# Patient Record
Sex: Male | Born: 1950 | Race: White | Hispanic: No | Marital: Single | Smoking: Never smoker
Health system: Southern US, Community
[De-identification: ages and names within clinical notes are randomized; demographics above are authoritative.]

## PROBLEM LIST (undated history)

## (undated) DIAGNOSIS — C801 Malignant (primary) neoplasm, unspecified: Secondary | ICD-10-CM

## (undated) HISTORY — PX: CATARACT EXTRACTION: SUR2

## (undated) HISTORY — PX: RETINAL DETACHMENT SURGERY: SHX105

---

## 1972-12-13 HISTORY — PX: OTHER SURGICAL HISTORY: SHX169

## 2010-12-13 HISTORY — PX: OTHER SURGICAL HISTORY: SHX169

## 2011-12-13 ENCOUNTER — Other Ambulatory Visit: Payer: Self-pay | Admitting: Internal Medicine

## 2011-12-13 DIAGNOSIS — M545 Low back pain, unspecified: Secondary | ICD-10-CM

## 2011-12-15 ENCOUNTER — Other Ambulatory Visit: Payer: Self-pay | Admitting: Internal Medicine

## 2011-12-15 DIAGNOSIS — M545 Low back pain, unspecified: Secondary | ICD-10-CM

## 2011-12-16 ENCOUNTER — Ambulatory Visit
Admission: RE | Admit: 2011-12-16 | Discharge: 2011-12-16 | Disposition: A | Discharge status: Home / Self Care | Payer: BC Managed Care – PPO | Source: Ambulatory Visit | Attending: Internal Medicine | Admitting: Internal Medicine

## 2011-12-16 DIAGNOSIS — M545 Low back pain, unspecified: Secondary | ICD-10-CM

## 2012-02-25 ENCOUNTER — Other Ambulatory Visit: Payer: Self-pay | Admitting: Neurosurgery

## 2012-02-25 DIAGNOSIS — M545 Low back pain, unspecified: Secondary | ICD-10-CM

## 2012-03-23 ENCOUNTER — Ambulatory Visit
Admission: RE | Admit: 2012-03-23 | Discharge: 2012-03-23 | Disposition: A | Discharge status: Home / Self Care | Payer: BC Managed Care – PPO | Source: Ambulatory Visit | Attending: Neurosurgery | Admitting: Neurosurgery

## 2012-03-23 DIAGNOSIS — M545 Low back pain, unspecified: Secondary | ICD-10-CM

## 2012-07-06 ENCOUNTER — Other Ambulatory Visit (HOSPITAL_COMMUNITY): Payer: Self-pay | Admitting: Neurosurgery

## 2012-07-07 ENCOUNTER — Other Ambulatory Visit (HOSPITAL_COMMUNITY): Payer: Self-pay | Admitting: Neurosurgery

## 2012-07-07 DIAGNOSIS — S32009A Unspecified fracture of unspecified lumbar vertebra, initial encounter for closed fracture: Secondary | ICD-10-CM

## 2012-07-12 ENCOUNTER — Encounter (HOSPITAL_COMMUNITY)
Admission: RE | Admit: 2012-07-12 | Discharge: 2012-07-12 | Disposition: A | Discharge status: Home / Self Care | Payer: BC Managed Care – PPO | Source: Ambulatory Visit | Attending: Neurosurgery | Admitting: Neurosurgery

## 2012-07-12 DIAGNOSIS — M4120 Other idiopathic scoliosis, site unspecified: Secondary | ICD-10-CM | POA: Insufficient documentation

## 2012-07-12 DIAGNOSIS — X58XXXA Exposure to other specified factors, initial encounter: Secondary | ICD-10-CM | POA: Insufficient documentation

## 2012-07-12 DIAGNOSIS — S32009A Unspecified fracture of unspecified lumbar vertebra, initial encounter for closed fracture: Secondary | ICD-10-CM

## 2012-07-12 MED ORDER — TECHNETIUM TC 99M MEDRONATE IV KIT
25.0000 | PACK | Freq: Once | INTRAVENOUS | Status: AC | PRN
  Administered 2012-07-12: 25 via INTRAVENOUS

## 2013-12-24 ENCOUNTER — Encounter (INDEPENDENT_AMBULATORY_CARE_PROVIDER_SITE_OTHER): Payer: Self-pay

## 2013-12-24 ENCOUNTER — Ambulatory Visit (INDEPENDENT_AMBULATORY_CARE_PROVIDER_SITE_OTHER): Payer: BC Managed Care – PPO | Admitting: Neurology

## 2013-12-24 ENCOUNTER — Encounter: Payer: Self-pay | Admitting: Neurology

## 2013-12-24 VITALS — BP 157/84 | HR 65 | Ht 66.0 in | Wt 180.0 lb

## 2013-12-24 DIAGNOSIS — R202 Paresthesia of skin: Secondary | ICD-10-CM | POA: Insufficient documentation

## 2013-12-24 DIAGNOSIS — R209 Unspecified disturbances of skin sensation: Secondary | ICD-10-CM

## 2013-12-24 DIAGNOSIS — R29898 Other symptoms and signs involving the musculoskeletal system: Secondary | ICD-10-CM
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
## 2013-12-25 LAB — HEPATITIS PANEL, ACUTE
Hep A IgM: NEGATIVE
Hep B C IgM: NEGATIVE
Hep C Virus Ab: <0.1 s/co ratio (ref 0.0–0.9)
Hepatitis B Surface Ag: NEGATIVE

## 2013-12-25 LAB — COMPREHENSIVE METABOLIC PANEL
ALT: 29 IU/L (ref 0–44)
AST: 22 IU/L (ref 0–40)
Albumin/Globulin Ratio: 1.8 (ref 1.1–2.5)
Albumin: 4.8 g/dL (ref 3.6–4.8)
Alkaline Phosphatase: 49 IU/L (ref 39–117)
BUN/Creatinine Ratio: 25 — ABNORMAL HIGH (ref 10–22)
BUN: 22 mg/dL (ref 8–27)
CO2: 24 mmol/L (ref 18–29)
Calcium: 9.6 mg/dL (ref 8.6–10.2)
Chloride: 100 mmol/L (ref 97–108)
Creatinine, Ser: 0.87 mg/dL (ref 0.76–1.27)
GFR calc Af Amer: 105 mL/min/{1.73_m2} (ref >59)
GFR calc non Af Amer: 91 mL/min/{1.73_m2} (ref >59)
Globulin, Total: 2.7 g/dL (ref 1.5–4.5)
Glucose: 96 mg/dL (ref 65–99)
Potassium: 4.5 mmol/L (ref 3.5–5.2)
Sodium: 144 mmol/L (ref 134–144)
Total Bilirubin: 0.9 mg/dL (ref 0.0–1.2)
Total Protein: 7.5 g/dL (ref 6.0–8.5)

## 2013-12-25 LAB — CBC WITH DIFFERENTIAL
Basophils Absolute: 0 10*3/uL (ref 0.0–0.2)
Basos: 0 %
Eos: 0 %
Eosinophils Absolute: 0 10*3/uL (ref 0.0–0.4)
HCT: 42.9 % (ref 37.5–51.0)
Hemoglobin: 14.8 g/dL (ref 12.6–17.7)
Immature Grans (Abs): 0 10*3/uL (ref 0.0–0.1)
Immature Granulocytes: 0 %
Lymphocytes Absolute: 0.8 10*3/uL (ref 0.7–3.1)
Lymphs: 6 %
MCH: 28.4 pg (ref 26.6–33.0)
MCHC: 34.5 g/dL (ref 31.5–35.7)
MCV: 82 fL (ref 79–97)
Monocytes Absolute: 0.5 10*3/uL (ref 0.1–0.9)
Monocytes: 4 %
Neutrophils Absolute: 11.7 10*3/uL — ABNORMAL HIGH (ref 1.4–7.0)
Neutrophils Relative %: 90 %
Platelets: 186 10*3/uL (ref 150–379)
RBC: 5.21 x10E6/uL (ref 4.14–5.80)
RDW: 15.1 % (ref 12.3–15.4)
WBC: 13 10*3/uL — ABNORMAL HIGH (ref 3.4–10.8)

## 2013-12-25 LAB — THYROID PANEL WITH TSH
Free Thyroxine Index: 1.9 (ref 1.2–4.9)
T3 Uptake Ratio: 25 % (ref 24–39)
T4, Total: 7.4 ug/dL (ref 4.5–12.0)
TSH: 0.625 u[IU]/mL (ref 0.450–4.500)

## 2013-12-25 LAB — SEDIMENTATION RATE: Sed Rate: 4 mm/hr (ref 0–30)

## 2013-12-25 LAB — FOLATE: Folate: 18.4 ng/mL (ref >3.0)

## 2013-12-25 LAB — VITAMIN B12: Vitamin B-12: 418 pg/mL (ref 211–946)

## 2013-12-25 LAB — RPR: RPR: NONREACTIVE

## 2013-12-25 LAB — C-REACTIVE PROTEIN: CRP: 0.5 mg/L (ref 0.0–4.9)

## 2013-12-25 LAB — CK: Total CK: 190 U/L (ref 24–204)

## 2013-12-25 LAB — HIV ANTIBODY (ROUTINE TESTING W REFLEX)
HIV 1/O/2 Abs-Index Value: <1 (ref <1.00)
HIV-1/HIV-2 Ab: NONREACTIVE

## 2013-12-25 LAB — LYME, TOTAL AB TEST/REFLEX: Lyme IgG/IgM Ab: <0.91 {ISR} (ref 0.00–0.90)

## 2013-12-26 ENCOUNTER — Encounter (INDEPENDENT_AMBULATORY_CARE_PROVIDER_SITE_OTHER): Payer: Self-pay | Admitting: Radiology

## 2013-12-26 ENCOUNTER — Ambulatory Visit (INDEPENDENT_AMBULATORY_CARE_PROVIDER_SITE_OTHER): Payer: BC Managed Care – PPO | Admitting: Neurology

## 2013-12-26 DIAGNOSIS — R29898 Other symptoms and signs involving the musculoskeletal system: Secondary | ICD-10-CM

## 2013-12-26 DIAGNOSIS — Z0289 Encounter for other administrative examinations: Secondary | ICD-10-CM

## 2013-12-26 DIAGNOSIS — R202 Paresthesia of skin: Secondary | ICD-10-CM

## 2013-12-26 DIAGNOSIS — R209 Unspecified disturbances of skin sensation: Secondary | ICD-10-CM

## 2013-12-28 NOTE — Procedures (Signed)
    [facility]  NCS (NERVE CONDUCTION STUDY) WITH EMG (ELECTROMYOGRAPHY) REPORT   STUDY DATE: Jan 14th 2015. PATIENT NAME: Nathaniel Norton DOB: 06-28-1951 MRN: [id number]    TECHNOLOGIST: Towana Badger ELECTROMYOGRAPHER: Marcial Pacas M.D.  CLINICAL INFORMATION:  63 years old right-handed Caucasian male presenting with acute onset of right proximal arm weakness since 12/10/2013, he denies significant neck or right shoulder pain, he also complains of  paresthesia at left C5-6 dermatomes  On examination: Right shoulder abduction 4,  external rotation 4, right elbow flexion 4, extension 5-, supination 4 , pronation 4+, wrist flexion 5, wrist extension 5. He has mild decreased light touch at left C5-6 dermatomes. Deep tendon reflexes bilateral biceps 2,  Triceps 2 brachioradialis 2.   Nerve conduction study:  Bilateral ulnar, radial sensory responses were normal, with similar snap amplitude. Right median sensory response showed mildly prolonged peak latency, with normal snap amplitude. Right median motor response showed mildly prolonged distal latency, with normal symmetric amplitude, conduction velocity.  Left median sensory and motor responses were normal.  Bilateral  lateral antebrachial cutaneous sensory responses were present, right side showed more than 50% decreased snap amplitude compared to her left side.  Electromyography: Selected needle examination was performed at bilateral upper extremity muscles, right cervical paraspinal muscles.  There was significant chronic neuropathic changes involving right biceps, brachial radialis, brachialis, supraspinatus, infraspinatus, serratous anterior, rhomboid, which showed normally insertion activity no spontaneous activity enlarged motor unit potential with decreased recruitment patterns,    Slighter degree of chronic neuropathic changes involving right triceps, pronator teres: normally insertion activity, no spontaneous activity,  mixture of normal, some enlarged motor unit potential with slightly decreased recruitment patterns.   Needle examination of extensor digital communis, first dorsal interossei was normal. There was no spontaneous activity at right cervical paraspinal muscles right C5, 6, 7.  Needle examination of left deltoid, biceps, triceps, extensor digitorum communis was normal.  In conclusion:  This is an abnormal study, there is electrodiagnostic evidence of chronic neuropathic changes involving right C5, 6, lesser degree C7 myotomes, with well-preserved sensory response, abnormal findings support a diagnosis of the acute right C5, 6, 7 nerve roots vs anterior horn cell pathology.  There is also evidence of moderate right carpal tunnel syndrome . MRI of the cervical spine is planned,

## 2014-04-15 DIAGNOSIS — M810 Age-related osteoporosis without current pathological fracture: Secondary | ICD-10-CM | POA: Diagnosis not present

## 2014-10-28 DIAGNOSIS — Z23 Encounter for immunization: Secondary | ICD-10-CM | POA: Diagnosis not present

## 2014-10-28 DIAGNOSIS — Z1389 Encounter for screening for other disorder: Secondary | ICD-10-CM | POA: Diagnosis not present

## 2014-10-28 DIAGNOSIS — R202 Paresthesia of skin: Secondary | ICD-10-CM | POA: Diagnosis not present

## 2014-10-28 DIAGNOSIS — Z0000 Encounter for general adult medical examination without abnormal findings: Secondary | ICD-10-CM | POA: Diagnosis not present

## 2014-10-28 DIAGNOSIS — L821 Other seborrheic keratosis: Secondary | ICD-10-CM | POA: Diagnosis not present

## 2014-10-28 DIAGNOSIS — Z79899 Other long term (current) drug therapy: Secondary | ICD-10-CM | POA: Diagnosis not present

## 2014-10-31 DIAGNOSIS — E875 Hyperkalemia: Secondary | ICD-10-CM | POA: Diagnosis not present

## 2014-10-31 DIAGNOSIS — I10 Essential (primary) hypertension: Secondary | ICD-10-CM | POA: Diagnosis not present

## 2014-10-31 DIAGNOSIS — Z79899 Other long term (current) drug therapy: Secondary | ICD-10-CM | POA: Diagnosis not present

## 2014-11-28 DIAGNOSIS — H33022 Retinal detachment with multiple breaks, left eye: Secondary | ICD-10-CM | POA: Diagnosis not present

## 2014-11-28 DIAGNOSIS — H26491 Other secondary cataract, right eye: Secondary | ICD-10-CM | POA: Diagnosis not present

## 2014-11-28 DIAGNOSIS — H21233 Degeneration of iris (pigmentary), bilateral: Secondary | ICD-10-CM | POA: Diagnosis not present

## 2014-11-28 DIAGNOSIS — Z9842 Cataract extraction status, left eye: Secondary | ICD-10-CM | POA: Diagnosis not present

## 2014-11-28 DIAGNOSIS — H33021 Retinal detachment with multiple breaks, right eye: Secondary | ICD-10-CM | POA: Diagnosis not present

## 2014-11-28 DIAGNOSIS — Z9841 Cataract extraction status, right eye: Secondary | ICD-10-CM | POA: Diagnosis not present

## 2015-04-15 DIAGNOSIS — Z9841 Cataract extraction status, right eye: Secondary | ICD-10-CM | POA: Diagnosis not present

## 2015-04-15 DIAGNOSIS — H26491 Other secondary cataract, right eye: Secondary | ICD-10-CM | POA: Diagnosis not present

## 2015-04-15 DIAGNOSIS — H21233 Degeneration of iris (pigmentary), bilateral: Secondary | ICD-10-CM | POA: Diagnosis not present

## 2015-04-15 DIAGNOSIS — H33021 Retinal detachment with multiple breaks, right eye: Secondary | ICD-10-CM | POA: Diagnosis not present

## 2015-04-15 DIAGNOSIS — H33022 Retinal detachment with multiple breaks, left eye: Secondary | ICD-10-CM | POA: Diagnosis not present

## 2015-11-03 DIAGNOSIS — Z0000 Encounter for general adult medical examination without abnormal findings: Secondary | ICD-10-CM | POA: Diagnosis not present

## 2015-11-03 DIAGNOSIS — Z79899 Other long term (current) drug therapy: Secondary | ICD-10-CM | POA: Diagnosis not present

## 2015-11-03 DIAGNOSIS — Z23 Encounter for immunization: Secondary | ICD-10-CM | POA: Diagnosis not present

## 2015-11-03 DIAGNOSIS — Z1389 Encounter for screening for other disorder: Secondary | ICD-10-CM | POA: Diagnosis not present

## 2015-11-03 DIAGNOSIS — M810 Age-related osteoporosis without current pathological fracture: Secondary | ICD-10-CM | POA: Diagnosis not present

## 2016-05-04 DIAGNOSIS — H21233 Degeneration of iris (pigmentary), bilateral: Secondary | ICD-10-CM | POA: Diagnosis not present

## 2016-05-04 DIAGNOSIS — H5213 Myopia, bilateral: Secondary | ICD-10-CM | POA: Diagnosis not present

## 2016-05-04 DIAGNOSIS — Z9842 Cataract extraction status, left eye: Secondary | ICD-10-CM | POA: Diagnosis not present

## 2016-05-04 DIAGNOSIS — H2640 Unspecified secondary cataract: Secondary | ICD-10-CM | POA: Diagnosis not present

## 2016-05-04 DIAGNOSIS — Z8669 Personal history of other diseases of the nervous system and sense organs: Secondary | ICD-10-CM | POA: Diagnosis not present

## 2016-05-04 DIAGNOSIS — Z9841 Cataract extraction status, right eye: Secondary | ICD-10-CM | POA: Diagnosis not present

## 2016-05-04 DIAGNOSIS — Z9889 Other specified postprocedural states: Secondary | ICD-10-CM | POA: Diagnosis not present

## 2016-05-04 DIAGNOSIS — Z961 Presence of intraocular lens: Secondary | ICD-10-CM | POA: Diagnosis not present

## 2016-05-04 DIAGNOSIS — H59813 Chorioretinal scars after surgery for detachment, bilateral: Secondary | ICD-10-CM | POA: Diagnosis not present

## 2016-05-04 DIAGNOSIS — H52203 Unspecified astigmatism, bilateral: Secondary | ICD-10-CM | POA: Diagnosis not present

## 2016-05-04 DIAGNOSIS — H524 Presbyopia: Secondary | ICD-10-CM | POA: Diagnosis not present

## 2016-11-03 DIAGNOSIS — Z79899 Other long term (current) drug therapy: Secondary | ICD-10-CM | POA: Diagnosis not present

## 2016-11-03 DIAGNOSIS — Z1389 Encounter for screening for other disorder: Secondary | ICD-10-CM | POA: Diagnosis not present

## 2016-11-03 DIAGNOSIS — M810 Age-related osteoporosis without current pathological fracture: Secondary | ICD-10-CM | POA: Diagnosis not present

## 2016-11-03 DIAGNOSIS — J301 Allergic rhinitis due to pollen: Secondary | ICD-10-CM | POA: Diagnosis not present

## 2016-11-03 DIAGNOSIS — Z23 Encounter for immunization: Secondary | ICD-10-CM | POA: Diagnosis not present

## 2016-11-03 DIAGNOSIS — Z0000 Encounter for general adult medical examination without abnormal findings: Secondary | ICD-10-CM | POA: Diagnosis not present

## 2016-11-09 DIAGNOSIS — M810 Age-related osteoporosis without current pathological fracture: Secondary | ICD-10-CM | POA: Diagnosis not present

## 2016-11-09 DIAGNOSIS — M8588 Other specified disorders of bone density and structure, other site: Secondary | ICD-10-CM | POA: Diagnosis not present

## 2017-05-05 DIAGNOSIS — H21233 Degeneration of iris (pigmentary), bilateral: Secondary | ICD-10-CM | POA: Diagnosis not present

## 2017-05-05 DIAGNOSIS — H33022 Retinal detachment with multiple breaks, left eye: Secondary | ICD-10-CM | POA: Diagnosis not present

## 2017-05-05 DIAGNOSIS — H33021 Retinal detachment with multiple breaks, right eye: Secondary | ICD-10-CM | POA: Diagnosis not present

## 2017-05-05 DIAGNOSIS — Z961 Presence of intraocular lens: Secondary | ICD-10-CM | POA: Diagnosis not present

## 2017-11-08 DIAGNOSIS — Z6826 Body mass index (BMI) 26.0-26.9, adult: Secondary | ICD-10-CM | POA: Diagnosis not present

## 2017-11-08 DIAGNOSIS — Z0000 Encounter for general adult medical examination without abnormal findings: Secondary | ICD-10-CM | POA: Diagnosis not present

## 2017-11-08 DIAGNOSIS — S61412A Laceration without foreign body of left hand, initial encounter: Secondary | ICD-10-CM | POA: Diagnosis not present

## 2017-11-08 DIAGNOSIS — Z1389 Encounter for screening for other disorder: Secondary | ICD-10-CM | POA: Diagnosis not present

## 2017-11-08 DIAGNOSIS — Z23 Encounter for immunization: Secondary | ICD-10-CM | POA: Diagnosis not present

## 2017-11-08 DIAGNOSIS — E663 Overweight: Secondary | ICD-10-CM | POA: Diagnosis not present

## 2017-11-08 DIAGNOSIS — M810 Age-related osteoporosis without current pathological fracture: Secondary | ICD-10-CM | POA: Diagnosis not present

## 2017-11-08 DIAGNOSIS — S32040D Wedge compression fracture of fourth lumbar vertebra, subsequent encounter for fracture with routine healing: Secondary | ICD-10-CM | POA: Diagnosis not present

## 2018-11-14 DIAGNOSIS — Z125 Encounter for screening for malignant neoplasm of prostate: Secondary | ICD-10-CM | POA: Diagnosis not present

## 2018-11-14 DIAGNOSIS — Z1389 Encounter for screening for other disorder: Secondary | ICD-10-CM | POA: Diagnosis not present

## 2018-11-14 DIAGNOSIS — Z136 Encounter for screening for cardiovascular disorders: Secondary | ICD-10-CM | POA: Diagnosis not present

## 2018-11-14 DIAGNOSIS — Z131 Encounter for screening for diabetes mellitus: Secondary | ICD-10-CM | POA: Diagnosis not present

## 2018-11-14 DIAGNOSIS — Z0000 Encounter for general adult medical examination without abnormal findings: Secondary | ICD-10-CM | POA: Diagnosis not present

## 2018-11-14 DIAGNOSIS — Z23 Encounter for immunization: Secondary | ICD-10-CM | POA: Diagnosis not present

## 2018-11-14 DIAGNOSIS — S32040D Wedge compression fracture of fourth lumbar vertebra, subsequent encounter for fracture with routine healing: Secondary | ICD-10-CM | POA: Diagnosis not present

## 2018-11-14 DIAGNOSIS — M810 Age-related osteoporosis without current pathological fracture: Secondary | ICD-10-CM | POA: Diagnosis not present

## 2018-11-15 DIAGNOSIS — M8589 Other specified disorders of bone density and structure, multiple sites: Secondary | ICD-10-CM | POA: Diagnosis not present

## 2018-11-16 DIAGNOSIS — H21233 Degeneration of iris (pigmentary), bilateral: Secondary | ICD-10-CM | POA: Diagnosis not present

## 2018-11-16 DIAGNOSIS — Z961 Presence of intraocular lens: Secondary | ICD-10-CM | POA: Diagnosis not present

## 2018-11-16 DIAGNOSIS — Z8669 Personal history of other diseases of the nervous system and sense organs: Secondary | ICD-10-CM | POA: Diagnosis not present

## 2018-11-28 DIAGNOSIS — M810 Age-related osteoporosis without current pathological fracture: Secondary | ICD-10-CM | POA: Diagnosis not present

## 2019-01-24 DIAGNOSIS — R972 Elevated prostate specific antigen [PSA]: Secondary | ICD-10-CM | POA: Diagnosis not present

## 2019-03-30 DIAGNOSIS — Z1211 Encounter for screening for malignant neoplasm of colon: Secondary | ICD-10-CM | POA: Diagnosis not present

## 2019-04-02 DIAGNOSIS — K219 Gastro-esophageal reflux disease without esophagitis: Secondary | ICD-10-CM | POA: Diagnosis not present

## 2019-04-02 DIAGNOSIS — R0600 Dyspnea, unspecified: Secondary | ICD-10-CM | POA: Diagnosis not present

## 2019-04-02 DIAGNOSIS — R5383 Other fatigue: Secondary | ICD-10-CM | POA: Diagnosis not present

## 2019-04-02 DIAGNOSIS — R195 Other fecal abnormalities: Secondary | ICD-10-CM | POA: Diagnosis not present

## 2019-04-03 DIAGNOSIS — D539 Nutritional anemia, unspecified: Secondary | ICD-10-CM | POA: Diagnosis not present

## 2019-04-03 DIAGNOSIS — R5383 Other fatigue: Secondary | ICD-10-CM | POA: Diagnosis not present

## 2019-04-05 DIAGNOSIS — K219 Gastro-esophageal reflux disease without esophagitis: Secondary | ICD-10-CM | POA: Diagnosis not present

## 2019-04-05 DIAGNOSIS — K921 Melena: Secondary | ICD-10-CM | POA: Diagnosis not present

## 2019-04-05 DIAGNOSIS — D500 Iron deficiency anemia secondary to blood loss (chronic): Secondary | ICD-10-CM | POA: Diagnosis not present

## 2019-04-09 DIAGNOSIS — Q438 Other specified congenital malformations of intestine: Secondary | ICD-10-CM | POA: Diagnosis not present

## 2019-04-09 DIAGNOSIS — D123 Benign neoplasm of transverse colon: Secondary | ICD-10-CM | POA: Diagnosis not present

## 2019-04-09 DIAGNOSIS — C189 Malignant neoplasm of colon, unspecified: Secondary | ICD-10-CM | POA: Diagnosis not present

## 2019-04-09 DIAGNOSIS — D500 Iron deficiency anemia secondary to blood loss (chronic): Secondary | ICD-10-CM | POA: Diagnosis not present

## 2019-04-09 DIAGNOSIS — K921 Melena: Secondary | ICD-10-CM | POA: Diagnosis not present

## 2019-04-09 DIAGNOSIS — D122 Benign neoplasm of ascending colon: Secondary | ICD-10-CM | POA: Diagnosis not present

## 2019-04-09 DIAGNOSIS — K5669 Other partial intestinal obstruction: Secondary | ICD-10-CM | POA: Diagnosis not present

## 2019-04-09 DIAGNOSIS — C183 Malignant neoplasm of hepatic flexure: Secondary | ICD-10-CM | POA: Diagnosis not present

## 2019-04-09 DIAGNOSIS — K5730 Diverticulosis of large intestine without perforation or abscess without bleeding: Secondary | ICD-10-CM | POA: Diagnosis not present

## 2019-04-09 DIAGNOSIS — D490 Neoplasm of unspecified behavior of digestive system: Secondary | ICD-10-CM | POA: Diagnosis not present

## 2019-04-09 DIAGNOSIS — K6389 Other specified diseases of intestine: Secondary | ICD-10-CM | POA: Diagnosis not present

## 2019-04-09 DIAGNOSIS — K621 Rectal polyp: Secondary | ICD-10-CM | POA: Diagnosis not present

## 2019-04-10 ENCOUNTER — Other Ambulatory Visit: Payer: Self-pay | Admitting: Gastroenterology

## 2019-04-10 ENCOUNTER — Ambulatory Visit
Admission: RE | Admit: 2019-04-10 | Discharge: 2019-04-10 | Disposition: A | Discharge status: Home / Self Care | Payer: Medicare Other | Source: Ambulatory Visit | Attending: Gastroenterology | Admitting: Gastroenterology

## 2019-04-10 ENCOUNTER — Other Ambulatory Visit: Payer: Self-pay

## 2019-04-10 DIAGNOSIS — K6389 Other specified diseases of intestine: Secondary | ICD-10-CM | POA: Diagnosis not present

## 2019-04-10 MED ORDER — IOPAMIDOL (ISOVUE-300) INJECTION 61%
100.0000 mL | Freq: Once | INTRAVENOUS | Status: AC | PRN
  Administered 2019-04-10: 100 mL via INTRAVENOUS

## 2019-04-12 DIAGNOSIS — C189 Malignant neoplasm of colon, unspecified: Secondary | ICD-10-CM | POA: Diagnosis not present

## 2019-04-12 DIAGNOSIS — D122 Benign neoplasm of ascending colon: Secondary | ICD-10-CM | POA: Diagnosis not present

## 2019-04-12 DIAGNOSIS — D123 Benign neoplasm of transverse colon: Secondary | ICD-10-CM | POA: Diagnosis not present

## 2019-04-17 ENCOUNTER — Telehealth: Payer: Self-pay | Admitting: Hematology

## 2019-04-17 NOTE — Telephone Encounter (Signed)
A new patient appt has been scheduled for Nathaniel Norton to see Dr. Burr Medico on 5/8 at 230pm. Pt aware to arrive 15 minutes early to be checked in.

## 2019-04-19 NOTE — Progress Notes (Signed)
[facility]   [phone] [phone]   Clinic New Consult Note   Patient Care Team: Lajean Manes, MD as PCP - General (Internal Medicine) Berle Mull, MD as Consulting Physician (Family Medicine)  Date of Service:  04/20/2019   CHIEF COMPLAINTS/PURPOSE OF CONSULTATION:  Newly Diagnosed Colon Cancer  REFERRING PHYSICIAN:  Dr Watt Climes     Cancer of right colon [facility])   04/09/2019 Procedure    Colonoscopy 04/09/19 by Dr Watt Climes IMPRESSION -internal hemorrhoids -Diverticulosis in the sigmoid colon  -2 small polyps in the rectum and in the proximal transverse colon, removed with a hot snare. Resected and retrieved.  -3 medium polyps in the proximal transverse colon, in the mid transverse colon and in the distal transverse colon, removed and resected and retrieved.  -likely malignant partially obstructing tumor at the hepatic flexure. biopsied, tattooed.  -1 large polyp in the mid ascending colon  -the examination was otherwise normal      04/09/2019 Initial Biopsy    FINAL MICROSCOPIC DIAGNOSIS: 04/09/19 1. LG intestine-hepatic flexure, Biopsy:   INVASIVE WELL DIFFERENTIATED ADENOCARCINOMA    04/10/2019 Imaging    CT AP 04/10/19  IMPRESSION: 1. There is an eccentric mass of the colon involving the ascending colon near the hepatic flexure measuring approximately 3.4 x 3.4 by 2.0 cm (series 2, image 37, series 3, image 37). There is extensive soft tissue nodularity of the mesocolon and omentum, and likely areas of the peritoneum, for example bilateral upper quadrants (series 2, image 25). Findings are consistent with primary colon malignancy, probable omental and peritoneal involvement, and small volume malignant ascites. 2.  Other chronic and incidental findings as detailed above.    04/20/2019 Initial Diagnosis    Cancer of right colon (HCC)      HISTORY OF PRESENTING ILLNESS:  Nathaniel Norton 68 y.o. male is a here because of newly  diagnosed colon cancer. The patient was referred by Dr Watt Climes. The patient presents to the clinic today by himself.   He notes mild abdominal indigestion and moderate SOB for 2 months before he contacted his PCP. He dropped off his Stool card and results were positive and he had iron deficiency. He denies noticing black or bloody stool inially. He was Dr. Watt Climes afterward for more workup where he had coloscopy which showed he had colon cancer. Before his known diagnosis he was trying to maintain or lose weight during quarantine. So he subsequently did lose weight. He denies loss of appetite.   Today he notes since colonoscopy he has noticed having black stool. He has been taking iron pill lately. He notes his SOB has improved, he is able to be active still but tired afterward. He denies constipation, and has 1-2 bowel movements a day. He notes mid to right chest discomfort. He attributes to his increase in yardwork as muscular related. He notes he has elevated PSA was 5.7 in 11/2018 and 4.6 in 01/2019. He plans to wait on seeing Urologist for work up given colon cancer diagnosis which is understandable. 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   Socially he is single with no children. He lives alone in [place] in a 1 level home. He notes he does not have Internet access which is not a resource of communication for him. He does have a phone which he can be reached at. 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 He does not have any relatives in  [place]. He is a non-smoker and rarely drinks anymore and does not use recreational drugs.   They have no significant medical history or chronic diseases. He has had 2 eye surgeries and Lumbar compression fracture. He notes this fracture did not heal straight and impacts his back when doing certain activities. He denies family history of cancer that he is aware of.    REVIEW OF SYSTEMS:    Constitutional:  Denies fevers, chills or abnormal night sweats Eyes: Denies blurriness of vision, double vision or watery eyes Ears, nose, mouth, throat, and face: Denies mucositis or sore throat Respiratory: Denies cough or wheezes (+) SOB improved  Cardiovascular: Denies palpitation, chest discomfort or lower extremity swelling Gastrointestinal:  Denies nausea, heartburn or change in bowel habits (+) black stool (+) Abdominal indigestion.  MSK: (+) mid to right chest discomfort, soreness with movement (+) Intermittent chronic back pain  Skin: Denies abnormal skin rashes (+) Rosacea of face  Lymphatics: Denies new lymphadenopathy or easy bruising Neurological:Denies numbness, tingling or new weaknesses Behavioral/Psych: Mood is stable, no new changes  All other systems were reviewed with the patient and are negative.   MEDICAL HISTORY:  History reviewed. No pertinent past medical history.  SURGICAL HISTORY: Past Surgical History:  Procedure Laterality Date  . CATARACT EXTRACTION    . L4 fracture  2012  . RETINAL DETACHMENT SURGERY    . Right hand surgery  1974    SOCIAL HISTORY: Social History   Socioeconomic History  . Marital status: Single    Spouse name: Not on file  . Number of children: Not on file  . Years of education: Not on file  . Highest education level: Not on file  Occupational History  . Occupation: retired   Scientific laboratory technician  . Financial resource strain: Not on file  . Food insecurity:    Worry: Not on file    Inability: Not on file  . Transportation needs:    Medical: Not on file    Non-medical: Not on file  Tobacco Use  . Smoking status: Never Smoker  Substance and Sexual Activity  . Alcohol use: Not on file    Comment: no   . Drug use: Never  . Sexual activity: Not on file  Lifestyle  . Physical activity:    Days per week: Not on file    Minutes per session: Not on file  . Stress: Not on file  Relationships  . Social connections:    Talks on phone: Not on  file    Gets together: Not on file    Attends religious service: Not on file    Active member of club or organization: Not on file    Attends meetings of clubs or organizations: Not on file    Relationship status: Not on file  . Intimate partner violence:    Fear of current or ex partner: Not on file    Emotionally abused: Not on file    Physically abused: Not on file    Forced sexual activity: Not on file  Other Topics Concern  . Not on file  Social History Narrative   He lives by himself, not married, no children. 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    FAMILY HISTORY: Family History  Problem Relation Age of Onset  . Coronary artery disease Father 77  . Stroke Mother 66    ALLERGIES:  is allergic to codeine.  MEDICATIONS:  Current Outpatient Medications  Medication Sig Dispense Refill  .  ferrous sulfate 325 (65 FE) MG tablet Take 325 mg by mouth daily with breakfast.    . Multiple Vitamin (MULTIVITAMIN) tablet Take 1 tablet by mouth daily.     No current facility-administered medications for this visit.     PHYSICAL EXAMINATION: ECOG PERFORMANCE STATUS: 1 - Symptomatic but completely ambulatory  Vitals:   04/20/19 1423  BP: (!) 149/81  Pulse: 95  Resp: 18  Temp: 98.2 F (36.8 C)  SpO2: 98%   Filed Weights   04/20/19 1423  Weight: 173 lb 4.8 oz (78.6 kg)    GENERAL:alert, no distress and comfortable SKIN: skin texture, turgor are normal, no rashes or significant lesions (+) Rosacea of face  EYES: normal, conjunctiva are pink and non-injected, sclera clear OROPHARYNX:no exudate, no erythema and lips, buccal mucosa, and tongue normal  NECK: supple, thyroid normal size, non-tender, without nodularity LYMPH:  no palpable lymphadenopathy in the cervical, axillary or inguinal LUNGS: clear to auscultation and percussion with normal breathing effort HEART: regular rate & rhythm and no murmurs and no lower extremity edema  ABDOMEN:abdomen soft, non-tender and normal bowel sounds Musculoskeletal:no cyanosis of digits and no clubbing (+) No hepatomegaly  PSYCH: alert & oriented x 3 with fluent speech NEURO: no focal motor/sensory deficits  LABORATORY DATA:  I have reviewed the data as listed CBC Latest Ref Rng & Units 12/24/2013  WBC 3.4 - 10.8 x10E3/uL 13.0(H)  Hemoglobin 12.6 - 17.7 g/dL 14.8  Hematocrit 37.5 - 51.0 % 42.9  Platelets 150 - 379 x10E3/uL 186    CMP Latest Ref Rng & Units 12/24/2013  Glucose 65 - 99 mg/dL 96  BUN 8 - 27 mg/dL 22  Creatinine 0.76 - 1.27 mg/dL 0.87  Sodium 134 - 144 mmol/L 144  Potassium 3.5 - 5.2 mmol/L 4.5  Chloride 97 - 108 mmol/L 100  CO2 18 - 29 mmol/L 24  Calcium 8.6 - 10.2 mg/dL 9.6  Total Protein 6.0 - 8.5 g/dL 7.5  Total Bilirubin 0.0 - 1.2 mg/dL 0.9  Alkaline Phos 39 - 117 IU/L 49  AST 0 - 40 IU/L 22  ALT 0 - 44 IU/L 29   outside lab 04/03/2019: CBC: WBC 6.2, globin 8.3, hemoglobin 8.3, hematocrit 28.5%, MCV 59.6, platelet 290K CMP: Glucose 103, BUN 10, creatinine 0.96, sodium 140, potassium 4.5, calcium 9.2, total protein 6.6, albumin 4.3, total bilirubin 0.7, ALP 62, AST 12, ALT 7   RADIOGRAPHIC STUDIES: I have personally reviewed the radiological images as listed and agreed with the findings in the report. Ct Abdomen Pelvis W Contrast  Result Date: 04/10/2019 CLINICAL DATA:  Hepatic flexure colon mass identified by colonoscopy EXAM: CT ABDOMEN AND PELVIS WITH CONTRAST TECHNIQUE: Multidetector CT imaging of the abdomen and pelvis was performed using the standard protocol following bolus administration of intravenous contrast. CONTRAST:  18m ISOVUE-300 IOPAMIDOL (ISOVUE-300) INJECTION 61% COMPARISON:  None. FINDINGS: Lower chest: No acute abnormality. Hepatobiliary: No focal liver abnormality is seen. No gallstones, gallbladder wall thickening, or biliary dilatation. Pancreas: Unremarkable. No pancreatic ductal dilatation or surrounding inflammatory changes.  Spleen: Mild splenomegaly, maximum span 13.7 cm. Adrenals/Urinary Tract: Adrenal glands are unremarkable. Small nonobstructive left renal calculi. Bladder is unremarkable. Stomach/Bowel: Stomach is within normal limits. Appendix appears normal. There is an eccentric mass of the colon involving the ascending colon near the hepatic flexure measuring approximately 3.4 x 3.4 by 2.0 cm (series 2, image 37, series 3, image 37). There is extensive soft tissue nodularity of the mesocolon and omentum, and likely areas of the  peritoneum, for example bilateral upper quadrants (series 2, image 25). Severe sigmoid diverticulosis. Vascular/Lymphatic: No significant vascular findings are present. No enlarged abdominal or pelvic lymph nodes. Reproductive: Prostatomegaly. Other: Small left inguinal hernia containing a single nonobstructed loop of sigmoid colon (series 2, image 70). Small volume ascites. Musculoskeletal: Multiple lumbar wedge and endplate deformities. IMPRESSION: 1. There is an eccentric mass of the colon involving the ascending colon near the hepatic flexure measuring approximately 3.4 x 3.4 by 2.0 cm (series 2, image 37, series 3, image 37). There is extensive soft tissue nodularity of the mesocolon and omentum, and likely areas of the peritoneum, for example bilateral upper quadrants (series 2, image 25). Findings are consistent with primary colon malignancy, probable omental and peritoneal involvement, and small volume malignant ascites. 2.  Other chronic and incidental findings as detailed above. Electronically Signed   By: Eddie Candle M.D.   On: 04/10/2019 12:11    ASSESSMENT & PLAN:  Nathaniel Norton is a 68 y.o. Caucasian male with no significant medical history  1. 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 His Colonoscopy biopsy showed adenocarcinoma of right hepatic flexure colon.   -His  CT AP shows 3.4 cm colon mass which is partially obstructing his bowel. I discussed as this continues to grow it can cause complete obstruction. Scan also shows soft tissue nodularity of mesocolon and omentum and peritoneum which is concerning for metastatic cancer and small ascites.  -I recommend peritoneal biopsy for definitive diagnosis. I also recommend CT chest to rule out distant metastasis in chest. He is agreeable.  -If biopsy is negative, I would recommend PET scan. If PET scan is negative standard treatment is surgery, which is curative, with possible adjuvant chemotherapy if high risk disease. Based on CT scan I highly suspect this is metastatic.  -If biopsy shows metastatic disease, he will be stage IV and this is likely incurable disease. Upfront surgery will not be offered given no significant bowel obstruction, uncontrolled bleeding or risk for perforation now.  We will recommend systemic therapy for metastatic disease.  If he responds well to chemotherapy, he maybe eligible for HIPAC surgery, although this won't be easy due to his advanced age.  -His initial physical exam today was unremarkable.  -I reviewed his outside lab results, which showed moderate anemia.  He has started oral iron a week ago, I will repeat his iron level and CBC, to see if he needs IV iron.  -f/u in 2 weeks   2. 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  -GI workup with coloscopy by Dr. Watt Climes showed internal hemorrhoids, benign polyps, diverticulosis and colon cancer.  -He was started on OTC multivitamin and oral iron in late 03/2019 and has been having black stool. This is likely from oral iron.  -Will obtain baseline labs and iron panel. If levels significantly low, may give IV iron for more direct intervention. I reviewed side effects such as possible allergy reaction. He is agreeable to proceed.   3. Elevated PSA  -PSA was 5.7 in  11/2018 and 4.6 in 01/2019 -He plans to wait on seeing Urologist for work up given colon cancer diagnosis. This is understandable.   4. Social Support  -He is single with no children and no family that lives in town.   -He lives alone in 1 level home  in [place].  -He does not have Internet access at home so this is not access of communication for him  -He does have a cell phone which he can be reached.   PLAN:  Lab today  CT chest wo contrast at GI in a week IR omentum biopsy next week F/u and iv feraheme in 2 weeks (around 5/20)    Orders Placed This Encounter  Procedures  . CT Chest Wo Contrast    Standing Status:   Future    Standing Expiration Date:   04/20/2020    Order Specific Question:   Preferred imaging location?    Answer:   [id number]    Order Specific Question:   Radiology Contrast Protocol - do NOT remove file path    Answer:   \\[email or web address]  . CT Biopsy    Standing Status:   Future    Standing Expiration Date:   04/20/2020    Scheduling Instructions:     Please schedule ASAP    Order Specific Question:   Lab orders requested (DO NOT place separate lab orders, these will be automatically ordered during procedure specimen collection):    Answer:   Surgical Pathology    Order Specific Question:   Reason for Exam (SYMPTOM  OR DIAGNOSIS REQUIRED)    Answer:   rule out metastasis    Order Specific Question:   Preferred location?    Answer:   [facility]    Order Specific Question:   Radiology Contrast Protocol - do NOT remove file path    Answer:   \\[email or web address]  . CBC with Differential ([facility] Only)    Standing Status:   Standing    Number of Occurrences:   100    Standing Expiration Date:   04/19/2024  . CMP ([facility] only)    Standing Status:   Standing    Number of Occurrences:   100    Standing Expiration Date:   04/19/2024  . Ferritin    Standing Status:   Standing     Number of Occurrences:   100    Standing Expiration Date:   04/19/2024  . Iron and TIBC    Standing Status:   Standing    Number of Occurrences:   100    Standing Expiration Date:   04/19/2024  . CEA (IN HOUSE-[facility])    Standing Status:   Standing    Number of Occurrences:   100    Standing Expiration Date:   04/19/2024    All questions were answered. The patient knows to call the clinic with any problems, questions or concerns. I spent 55 minutes counseling the patient face to face. The total time spent in the appointment was 60 minutes and more than 50% was on counseling.     Truitt Merle, MD 04/20/2019 3:30 PM  I, Joslyn Devon, am acting as scribe for Truitt Merle, MD.   I have reviewed the above documentation for accuracy and completeness, and I agree with the above.

## 2019-04-20 ENCOUNTER — Inpatient Hospital Stay: Payer: Medicare Other | Attending: Hematology | Admitting: Hematology

## 2019-04-20 ENCOUNTER — Other Ambulatory Visit: Payer: Self-pay

## 2019-04-20 ENCOUNTER — Inpatient Hospital Stay: Payer: Medicare Other

## 2019-04-20 ENCOUNTER — Encounter: Payer: Self-pay | Admitting: Hematology

## 2019-04-20 VITALS — BP 149/81 | HR 95 | Temp 98.2°F | Resp 18 | Ht 66.0 in | Wt 173.3 lb

## 2019-04-20 DIAGNOSIS — R590 Localized enlarged lymph nodes: Secondary | ICD-10-CM | POA: Diagnosis not present

## 2019-04-20 DIAGNOSIS — N200 Calculus of kidney: Secondary | ICD-10-CM | POA: Diagnosis not present

## 2019-04-20 DIAGNOSIS — G8929 Other chronic pain: Secondary | ICD-10-CM

## 2019-04-20 DIAGNOSIS — D500 Iron deficiency anemia secondary to blood loss (chronic): Secondary | ICD-10-CM

## 2019-04-20 DIAGNOSIS — R972 Elevated prostate specific antigen [PSA]: Secondary | ICD-10-CM | POA: Diagnosis not present

## 2019-04-20 DIAGNOSIS — M549 Dorsalgia, unspecified: Secondary | ICD-10-CM | POA: Diagnosis not present

## 2019-04-20 DIAGNOSIS — Z8249 Family history of ischemic heart disease and other diseases of the circulatory system: Secondary | ICD-10-CM | POA: Diagnosis not present

## 2019-04-20 DIAGNOSIS — C182 Malignant neoplasm of ascending colon: Secondary | ICD-10-CM | POA: Insufficient documentation

## 2019-04-20 DIAGNOSIS — R0789 Other chest pain: Secondary | ICD-10-CM | POA: Diagnosis not present

## 2019-04-20 DIAGNOSIS — Z885 Allergy status to narcotic agent status: Secondary | ICD-10-CM | POA: Diagnosis not present

## 2019-04-20 DIAGNOSIS — I2510 Atherosclerotic heart disease of native coronary artery without angina pectoris: Secondary | ICD-10-CM | POA: Insufficient documentation

## 2019-04-20 DIAGNOSIS — K4090 Unilateral inguinal hernia, without obstruction or gangrene, not specified as recurrent: Secondary | ICD-10-CM

## 2019-04-20 DIAGNOSIS — Z823 Family history of stroke: Secondary | ICD-10-CM | POA: Insufficient documentation

## 2019-04-20 DIAGNOSIS — R0602 Shortness of breath: Secondary | ICD-10-CM | POA: Diagnosis not present

## 2019-04-20 DIAGNOSIS — C786 Secondary malignant neoplasm of retroperitoneum and peritoneum: Secondary | ICD-10-CM | POA: Diagnosis not present

## 2019-04-20 DIAGNOSIS — L719 Rosacea, unspecified: Secondary | ICD-10-CM | POA: Diagnosis not present

## 2019-04-20 DIAGNOSIS — K648 Other hemorrhoids: Secondary | ICD-10-CM | POA: Insufficient documentation

## 2019-04-20 DIAGNOSIS — K30 Functional dyspepsia: Secondary | ICD-10-CM | POA: Diagnosis not present

## 2019-04-20 DIAGNOSIS — K921 Melena: Secondary | ICD-10-CM | POA: Insufficient documentation

## 2019-04-20 LAB — CBC WITH DIFFERENTIAL (CANCER CENTER ONLY)
Abs Immature Granulocytes: 0.03 10*3/uL (ref 0.00–0.07)
Basophils Absolute: 0 10*3/uL (ref 0.0–0.1)
Basophils Relative: 1 %
Eosinophils Absolute: 0.1 10*3/uL (ref 0.0–0.5)
Eosinophils Relative: 1 %
HCT: 29.4 % — ABNORMAL LOW (ref 39.0–52.0)
Hemoglobin: 8 g/dL — ABNORMAL LOW (ref 13.0–17.0)
Immature Granulocytes: 1 %
Lymphocytes Relative: 18 %
Lymphs Abs: 1 10*3/uL (ref 0.7–4.0)
MCH: 18 pg — ABNORMAL LOW (ref 26.0–34.0)
MCHC: 27.2 g/dL — ABNORMAL LOW (ref 30.0–36.0)
MCV: 66.1 fL — ABNORMAL LOW (ref 80.0–100.0)
Monocytes Absolute: 0.5 10*3/uL (ref 0.1–1.0)
Monocytes Relative: 8 %
Neutro Abs: 4.2 10*3/uL (ref 1.7–7.7)
Neutrophils Relative %: 71 %
Platelet Count: 318 10*3/uL (ref 150–400)
RBC: 4.45 MIL/uL (ref 4.22–5.81)
RDW: 23.1 % — ABNORMAL HIGH (ref 11.5–15.5)
WBC Count: 5.8 10*3/uL (ref 4.0–10.5)
nRBC: 0 % (ref 0.0–0.2)

## 2019-04-20 LAB — CMP (CANCER CENTER ONLY)
ALT: 12 U/L (ref 0–44)
AST: 13 U/L — ABNORMAL LOW (ref 15–41)
Albumin: 3.6 g/dL (ref 3.5–5.0)
Alkaline Phosphatase: 71 U/L (ref 38–126)
Anion gap: 11 (ref 5–15)
BUN: 10 mg/dL (ref 8–23)
CO2: 26 mmol/L (ref 22–32)
Calcium: 8.9 mg/dL (ref 8.9–10.3)
Chloride: 103 mmol/L (ref 98–111)
Creatinine: 1.04 mg/dL (ref 0.61–1.24)
GFR, Est AFR Am: >60 mL/min (ref >60)
GFR, Estimated: >60 mL/min (ref >60)
Glucose, Bld: 120 mg/dL — ABNORMAL HIGH (ref 70–99)
Potassium: 4.2 mmol/L (ref 3.5–5.1)
Sodium: 140 mmol/L (ref 135–145)
Total Bilirubin: 0.7 mg/dL (ref 0.3–1.2)
Total Protein: 6.9 g/dL (ref 6.5–8.1)

## 2019-04-21 ENCOUNTER — Encounter: Payer: Self-pay | Admitting: Hematology

## 2019-04-23 ENCOUNTER — Telehealth: Payer: Self-pay | Admitting: Hematology

## 2019-04-23 LAB — IRON AND TIBC
Iron: 44 ug/dL (ref 42–163)
Saturation Ratios: 13 % — ABNORMAL LOW (ref 20–55)
TIBC: 344 ug/dL (ref 202–409)
UIBC: 299 ug/dL (ref 117–376)

## 2019-04-23 LAB — FERRITIN: Ferritin: 13 ng/mL — ABNORMAL LOW (ref 24–336)

## 2019-04-23 LAB — CEA (IN HOUSE-CHCC): CEA (CHCC-In House): 30.78 ng/mL — ABNORMAL HIGH (ref 0.00–5.00)

## 2019-04-23 NOTE — Telephone Encounter (Signed)
Scheduled appt per 5/11 los.  Patient aware of appt date and time.

## 2019-04-24 ENCOUNTER — Telehealth: Payer: Self-pay | Admitting: *Deleted

## 2019-04-24 NOTE — Telephone Encounter (Signed)
-----   Message from Truitt Merle, MD sent at 04/24/2019 10:50 AM EDT ----- Please let pt know his lab result, due to moderate anemia and severe iron deficiency, I will set up iv iron this week (next week dose is scheduled). Thanks   Truitt Merle  04/24/2019

## 2019-04-24 NOTE — Telephone Encounter (Signed)
Spoke with pt and informed pt of iron results as per Dr. Burr Medico.  Pt understood he will be contacted for appt for iron infusion this week.

## 2019-04-24 NOTE — Telephone Encounter (Signed)
TCT patient regarding lab results. Spoke with patient and reviewed his CBC with him.  Informed him that Dr. Burr Medico is ordering his first iron infusion this week, he has the second one scheduled for 05/03/19 already. He voiced understanding. He also scheduled for CT scan of chest on this Thursday. Pt voices understanding of the above.  He verbalized understanding to call back with any questions or concerns @ [phone].

## 2019-04-26 ENCOUNTER — Other Ambulatory Visit: Payer: Self-pay | Admitting: Student

## 2019-04-26 ENCOUNTER — Ambulatory Visit (HOSPITAL_COMMUNITY)
Admission: RE | Admit: 2019-04-26 | Discharge: 2019-04-26 | Disposition: A | Discharge status: Home / Self Care | Payer: Medicare Other | Source: Ambulatory Visit | Attending: Hematology | Admitting: Hematology

## 2019-04-26 ENCOUNTER — Telehealth: Payer: Self-pay

## 2019-04-26 ENCOUNTER — Other Ambulatory Visit: Payer: Self-pay

## 2019-04-26 DIAGNOSIS — C182 Malignant neoplasm of ascending colon: Secondary | ICD-10-CM

## 2019-04-26 DIAGNOSIS — C189 Malignant neoplasm of colon, unspecified: Secondary | ICD-10-CM | POA: Diagnosis not present

## 2019-04-26 DIAGNOSIS — C786 Secondary malignant neoplasm of retroperitoneum and peritoneum: Secondary | ICD-10-CM | POA: Diagnosis not present

## 2019-04-26 NOTE — Telephone Encounter (Signed)
Called  Nathaniel Norton to introduce myself and explain role of nurse navigator. General information on members of his treatment team and supportive services discussed. Reviewed upcoming NPO status for Omentum BX on 5/15.  No barriers to treatment identified at this time. Provided patient with my direct contact information for questions or concerns. Will follow as needed.

## 2019-04-27 ENCOUNTER — Encounter (HOSPITAL_COMMUNITY): Payer: Self-pay

## 2019-04-27 ENCOUNTER — Ambulatory Visit (HOSPITAL_COMMUNITY)
Admission: RE | Admit: 2019-04-27 | Discharge: 2019-04-27 | Disposition: A | Discharge status: Home / Self Care | Payer: Medicare Other | Source: Ambulatory Visit | Attending: Hematology | Admitting: Hematology

## 2019-04-27 DIAGNOSIS — C189 Malignant neoplasm of colon, unspecified: Secondary | ICD-10-CM | POA: Diagnosis not present

## 2019-04-27 DIAGNOSIS — C182 Malignant neoplasm of ascending colon: Secondary | ICD-10-CM | POA: Insufficient documentation

## 2019-04-27 DIAGNOSIS — R1909 Other intra-abdominal and pelvic swelling, mass and lump: Secondary | ICD-10-CM | POA: Diagnosis not present

## 2019-04-27 DIAGNOSIS — K668 Other specified disorders of peritoneum: Secondary | ICD-10-CM | POA: Diagnosis not present

## 2019-04-27 DIAGNOSIS — C786 Secondary malignant neoplasm of retroperitoneum and peritoneum: Secondary | ICD-10-CM | POA: Diagnosis not present

## 2019-04-27 DIAGNOSIS — Z885 Allergy status to narcotic agent status: Secondary | ICD-10-CM | POA: Diagnosis not present

## 2019-04-27 DIAGNOSIS — Z79899 Other long term (current) drug therapy: Secondary | ICD-10-CM | POA: Diagnosis not present

## 2019-04-27 LAB — CBC
HCT: 35.6 % — ABNORMAL LOW (ref 39.0–52.0)
Hemoglobin: 9.4 g/dL — ABNORMAL LOW (ref 13.0–17.0)
MCH: 18.4 pg — ABNORMAL LOW (ref 26.0–34.0)
MCHC: 26.4 g/dL — ABNORMAL LOW (ref 30.0–36.0)
MCV: 69.7 fL — ABNORMAL LOW (ref 80.0–100.0)
Platelets: 314 10*3/uL (ref 150–400)
RBC: 5.11 MIL/uL (ref 4.22–5.81)
RDW: 26.1 % — ABNORMAL HIGH (ref 11.5–15.5)
WBC: 6.5 10*3/uL (ref 4.0–10.5)
nRBC: 0 % (ref 0.0–0.2)

## 2019-04-27 LAB — PROTIME-INR
INR: 1 (ref 0.8–1.2)
Prothrombin Time: 13.2 seconds (ref 11.4–15.2)

## 2019-04-27 LAB — APTT: aPTT: 29 seconds (ref 24–36)

## 2019-04-27 MED ORDER — MIDAZOLAM HCL 2 MG/2ML IJ SOLN
INTRAMUSCULAR | Status: AC
  Filled 2019-04-27: qty 4

## 2019-04-27 MED ORDER — LIDOCAINE HCL (PF) 1 % IJ SOLN
INTRAMUSCULAR | Status: AC | PRN
  Administered 2019-04-27: 5 mL

## 2019-04-27 MED ORDER — SODIUM CHLORIDE 0.9 % IV SOLN
INTRAVENOUS | Status: DC
  Administered 2019-04-27: 08:00:00 via INTRAVENOUS

## 2019-04-27 MED ORDER — FENTANYL CITRATE (PF) 100 MCG/2ML IJ SOLN
INTRAMUSCULAR | Status: AC
  Filled 2019-04-27: qty 2

## 2019-04-27 MED ORDER — MIDAZOLAM HCL 2 MG/2ML IJ SOLN
INTRAMUSCULAR | Status: AC | PRN
  Administered 2019-04-27 (×4): 1 mg via INTRAVENOUS

## 2019-04-27 MED ORDER — FENTANYL CITRATE (PF) 100 MCG/2ML IJ SOLN
INTRAMUSCULAR | Status: AC | PRN
  Administered 2019-04-27 (×2): 50 ug via INTRAVENOUS

## 2019-04-27 NOTE — Discharge Instructions (Signed)
Needle Biopsy, Care After °These instructions tell you how to care for yourself after your procedure. Your doctor may also give you more specific instructions. Call your doctor if you have any problems or questions. °What can I expect after the procedure? °After the procedure, it is common to have: °· Soreness. °· Bruising. °· Mild pain. °Follow these instructions at home: ° °· Return to your normal activities as told by your doctor. Ask your doctor what activities are safe for you. °· Take over-the-counter and prescription medicines only as told by your doctor. °· Wash your hands with soap and water before you change your bandage (dressing). If you cannot use soap and water, use hand sanitizer. °· Follow instructions from your doctor about: °? How to take care of your puncture site. °? When and how to change your bandage. °? When to remove your bandage. °· Check your puncture site every day for signs of infection. Watch for: °? Redness, swelling, or pain. °? Fluid or blood.  °? Pus or a bad smell. °? Warmth. °· Do not take baths, swim, or use a hot tub until your doctor approves. Ask your doctor if you may take showers. You may only be allowed to take sponge baths. °· Keep all follow-up visits as told by your doctor. This is important. °Contact a doctor if you have: °· A fever. °· Redness, swelling, or pain at the puncture site, and it lasts longer than a few days. °· Fluid, blood, or pus coming from the puncture site. °· Warmth coming from the puncture site. °Get help right away if: °· You have a lot of bleeding from the puncture site. °Summary °· After the procedure, it is common to have soreness, bruising, or mild pain at the puncture site. °· Check your puncture site every day for signs of infection, such as redness, swelling, or pain. °· Get help right away if you have severe bleeding from your puncture site. °This information is not intended to replace advice given to you by your health care provider. Make  sure you discuss any questions you have with your health care provider. °Document Released: 11/11/2008 Document Revised: 12/12/2017 Document Reviewed: 12/12/2017 °Elsevier Interactive Patient Education © 2019 [employer] °Moderate Conscious Sedation, Adult, Care After °These instructions provide you with information about caring for yourself after your procedure. Your health care provider may also give you more specific instructions. Your treatment has been planned according to current medical practices, but problems sometimes occur. Call your health care provider if you have any problems or questions after your procedure. °What can I expect after the procedure? °After your procedure, it is common: °· To feel sleepy for several hours. °· To feel clumsy and have poor balance for several hours. °· To have poor judgment for several hours. °· To vomit if you eat too soon. °Follow these instructions at home: °For at least 24 hours after the procedure: ° °· Do not: °? Participate in activities where you could fall or become injured. °? Drive. °? Use heavy machinery. °? Drink alcohol. °? Take sleeping pills or medicines that cause drowsiness. °? Make important decisions or sign legal documents. °? Take care of children on your own. °· Rest. °Eating and drinking °· Follow the diet recommended by your health care provider. °· If you vomit: °? Drink water, juice, or soup when you can drink without vomiting. °? Make sure you have little or no nausea before eating solid foods. °General instructions °· Have a responsible adult stay   with you until you are awake and alert. °· Take over-the-counter and prescription medicines only as told by your health care provider. °· If you smoke, do not smoke without supervision. °· Keep all follow-up visits as told by your health care provider. This is important. °Contact a health care provider if: °· You keep feeling nauseous or you keep vomiting. °· You feel light-headed. °· You develop a  rash. °· You have a fever. °Get help right away if: °· You have trouble breathing. °This information is not intended to replace advice given to you by your health care provider. Make sure you discuss any questions you have with your health care provider. °Document Released: 09/19/2013 Document Revised: 05/03/2016 Document Reviewed: 03/20/2016 °Elsevier Interactive Patient Education © 2019 [employer] ° °

## 2019-04-27 NOTE — H&P (Signed)
Chief Complaint: Patient was seen in consultation today for suspected metastatic colon cancer  Referring Physician(s): Feng,Yan  Supervising Physician: Markus Daft  Patient Status: [facility]  History of Present Illness: Nathaniel Norton is a 68 y.o. male with no significant past medical history recently diagnosed with colon cancer after colonoscopy 04/02/19.    CT Abdomen Pelvis 04/10/19 showed: 1. There is an eccentric mass of the colon involving the ascending colon near the hepatic flexure measuring approximately 3.4 x 3.4 by 2.0 cm (series 2, image 37, series 3, image 37). There is extensive soft tissue nodularity of the mesocolon and omentum, and likely areas of the peritoneum, for example bilateral upper quadrants (series 2, image 25). Findings are consistent with primary colon malignancy, probable omental and peritoneal involvement, and small volume malignant ascites.  A Chest CT completed yesterday also showed: 1. Borderline to mild lower thoracic adenopathy, including within the right internal mammary and juxta cardiophrenic stations. Given the appearance of the upper abdomen, suspicious for nodal metastasis. 2. A low right paratracheal node is borderline sized, but favored to be reactive. 3. No evidence of pulmonary metastasis.  IR consulted for omental/peritoneal mass biopsy at the request of Dr. Burr Medico to rule out metastasis.  Patient presents for procedure today in his usual state of health.  He has been NPO.  He does not take blood thinners.   History reviewed. No pertinent past medical history.  Past Surgical History:  Procedure Laterality Date   CATARACT EXTRACTION     L4 fracture  2012   RETINAL DETACHMENT SURGERY     Right hand surgery  1974    Allergies: Codeine  Medications: Prior to Admission medications   Medication Sig Start Date End Date Taking? Authorizing Provider  ferrous sulfate 325 (65 FE) MG tablet Take 325 mg by mouth daily  with breakfast.   Yes [provider]  Multiple Vitamin (MULTIVITAMIN) tablet Take 1 tablet by mouth daily.   Yes [provider]     Family History  Problem Relation Age of Onset   Coronary artery disease Father 68   Stroke Mother 57    Social History   Socioeconomic History   Marital status: Single    Spouse name: Not on file   Number of children: Not on file   Years of education: Not on file   Highest education level: Not on file  Occupational History   Occupation: retired   Scientist, product/process development strain: Not on file   Food insecurity:    Worry: Not on file    Inability: Not on Lexicographer needs:    Medical: Not on file    Non-medical: Not on file  Tobacco Use   Smoking status: Never Smoker   Smokeless tobacco: Never Used  Substance and Sexual Activity   Alcohol use: Not on file    Comment: no    Drug use: Never   Sexual activity: Not on file  Lifestyle   Physical activity:    Days per week: Not on file    Minutes per session: Not on file   Stress: Not on file  Relationships   Social connections:    Talks on phone: Not on file    Gets together: Not on file    Attends religious service: Not on file    Active member of club or organization: Not on file    Attends meetings of clubs or organizations: Not on file  Relationship status: Not on file  Other Topics Concern   Not on file  Social History Narrative   He lives by himself, not married, no children. 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     Review of Systems: A 12 point ROS discussed and pertinent positives are indicated in the HPI above.  All other systems are negative.  Review of Systems  Constitutional: Negative for diaphoresis and fatigue.  Respiratory: Negative for cough and shortness of breath.   Cardiovascular: Negative for chest pain.  Gastrointestinal: Negative for abdominal pain, nausea and vomiting.    Musculoskeletal: Negative for back pain.  Psychiatric/Behavioral: Negative for behavioral problems and confusion.    Vital Signs: There were no vitals taken for this visit.  Physical Exam Vitals signs and nursing note reviewed.  Constitutional:      Appearance: Normal appearance.  HENT:     Mouth/Throat:     Mouth: Mucous membranes are moist.     Pharynx: Oropharynx is clear.  Cardiovascular:     Rate and Rhythm: Normal rate and regular rhythm.     Heart sounds: No murmur. No friction rub. No gallop.   Pulmonary:     Effort: Pulmonary effort is normal. No respiratory distress.     Breath sounds: Normal breath sounds.  Abdominal:     General: Abdomen is flat.     Palpations: Abdomen is soft.  Skin:    General: Skin is warm and dry.  Neurological:     General: No focal deficit present.     Mental Status: He is alert and oriented to person, place, and time. Mental status is at baseline.  Psychiatric:        Mood and Affect: Mood normal.        Behavior: Behavior normal.        Thought Content: Thought content normal.        Judgment: Judgment normal.      MD Evaluation Airway: WNL Heart: WNL Abdomen: WNL Chest/ Lungs: WNL ASA  Classification: 3 Mallampati/Airway Score: One   Imaging: Ct Chest Wo Contrast  Result Date: 04/26/2019 CLINICAL DATA:  New diagnosis of colon cancer.  Staging. EXAM: CT CHEST WITHOUT CONTRAST TECHNIQUE: Multidetector CT imaging of the chest was performed following the standard protocol without IV contrast. COMPARISON:  Abdominal CT of 04/10/2019.  No prior chest imaging. FINDINGS: Cardiovascular: Normal caliber of the aorta and branch vessels. Tortuous thoracic aorta. Normal heart size, without pericardial effusion. Lad and left circumflex coronary artery calcification. Mediastinum/Nodes: No supraclavicular adenopathy. Borderline size low right paratracheal node at 10 mm on image 57/2. Hilar regions poorly evaluated without intravenous  contrast. Right internal mammary node of 8 mm on image 83/2 is mildly enlarged. There are also multiple small right cardiophrenic angle nodes, including at 9 mm on image 116/2. Lungs/Pleura: No pleural fluid.  Clear lungs. Upper Abdomen: Abdominal ascites and extensive omental/peritoneal metastasis. Normal imaged portions of the liver, spleen, stomach, adrenal glands, left kidney. Interpolar right renal cyst. Pancreatic parenchymal calcifications. Musculoskeletal: Mild compression deformities involving multiple mid and lower thoracic levels. A mild to moderate L1 compression deformity is similar to on the prior abdominal CT. IMPRESSION: 1. Borderline to mild lower thoracic adenopathy, including within the right internal mammary and juxta cardiophrenic stations. Given the appearance of the upper abdomen, suspicious for nodal metastasis. 2. A low right paratracheal node is borderline sized, but favored to be reactive. 3. No evidence of pulmonary metastasis. 4. Peritoneal metastasis and abdominal  ascites, as before. 5. Pancreatic parenchymal calcifications indicative of chronic calcific pancreatitis. 6. Coronary artery atherosclerosis. Electronically Signed   By: Abigail Miyamoto M.D.   On: 04/26/2019 13:39   Ct Abdomen Pelvis W Contrast  Result Date: 04/10/2019 CLINICAL DATA:  Hepatic flexure colon mass identified by colonoscopy EXAM: CT ABDOMEN AND PELVIS WITH CONTRAST TECHNIQUE: Multidetector CT imaging of the abdomen and pelvis was performed using the standard protocol following bolus administration of intravenous contrast. CONTRAST:  128mL ISOVUE-300 IOPAMIDOL (ISOVUE-300) INJECTION 61% COMPARISON:  None. FINDINGS: Lower chest: No acute abnormality. Hepatobiliary: No focal liver abnormality is seen. No gallstones, gallbladder wall thickening, or biliary dilatation. Pancreas: Unremarkable. No pancreatic ductal dilatation or surrounding inflammatory changes. Spleen: Mild splenomegaly, maximum span 13.7 cm.  Adrenals/Urinary Tract: Adrenal glands are unremarkable. Small nonobstructive left renal calculi. Bladder is unremarkable. Stomach/Bowel: Stomach is within normal limits. Appendix appears normal. There is an eccentric mass of the colon involving the ascending colon near the hepatic flexure measuring approximately 3.4 x 3.4 by 2.0 cm (series 2, image 37, series 3, image 37). There is extensive soft tissue nodularity of the mesocolon and omentum, and likely areas of the peritoneum, for example bilateral upper quadrants (series 2, image 25). Severe sigmoid diverticulosis. Vascular/Lymphatic: No significant vascular findings are present. No enlarged abdominal or pelvic lymph nodes. Reproductive: Prostatomegaly. Other: Small left inguinal hernia containing a single nonobstructed loop of sigmoid colon (series 2, image 70). Small volume ascites. Musculoskeletal: Multiple lumbar wedge and endplate deformities. IMPRESSION: 1. There is an eccentric mass of the colon involving the ascending colon near the hepatic flexure measuring approximately 3.4 x 3.4 by 2.0 cm (series 2, image 37, series 3, image 37). There is extensive soft tissue nodularity of the mesocolon and omentum, and likely areas of the peritoneum, for example bilateral upper quadrants (series 2, image 25). Findings are consistent with primary colon malignancy, probable omental and peritoneal involvement, and small volume malignant ascites. 2.  Other chronic and incidental findings as detailed above. Electronically Signed   By: Eddie Candle M.D.   On: 04/10/2019 12:11    Labs:  CBC: Recent Labs    04/20/19 1529 04/27/19 0722  WBC 5.8 6.5  HGB 8.0* 9.4*  HCT 29.4* 35.6*  PLT 318 314    COAGS: Recent Labs    04/27/19 0722  INR 1.0  APTT 29    BMP: Recent Labs    04/20/19 1529  NA 140  K 4.2  CL 103  CO2 26  GLUCOSE 120*  BUN 10  CALCIUM 8.9  CREATININE 1.04  GFRNONAA >60  GFRAA >60    LIVER FUNCTION TESTS: Recent Labs     04/20/19 1529  BILITOT 0.7  AST 13*  ALT 12  ALKPHOS 71  PROT 6.9  ALBUMIN 3.6    TUMOR MARKERS: No results for input(s): AFPTM, CEA, CA199, CHROMGRNA in the last 8760 hours.  Assessment and Plan: Patient with recent diagnosis of colon cancer presents with complaint of possible metastasis found on CT imaging.  IR consulted for biopsy at the request of Dr. Burr Medico. Case reviewed by Dr. Anselm Pancoast who approves patient for procedure.  Patient presents today in their usual state of health.  He has been NPO and is not currently on blood thinners.   Risks and benefits of biopsy was discussed with the patient and/or patient's family including, but not limited to bleeding, infection, damage to adjacent structures or low yield requiring additional tests.  All of the questions were answered and  there is agreement to proceed.  Consent signed and in chart.  Thank you for this interesting consult.  I greatly enjoyed meeting Nathaniel Norton and look forward to participating in their care.  A copy of this report was sent to the requesting provider on this date.  Electronically Signed: Docia Barrier, PA 04/27/2019, 8:36 AM   I spent a total of  30 Minutes   in face to face in clinical consultation, greater than 50% of which was counseling/coordinating care for colon cancer.

## 2019-04-27 NOTE — Procedures (Signed)
Interventional Radiology Procedure:   Indications: Colon cancer with peritoneal disease   Procedure: CT guided biopsy of peritoneal disease  Findings: Cores obtained from RUQ disease  Complications: None     EBL: less than 10 ml  Plan: Bedrest 2 hours, then discharge to home.    Nathaniel Leverette R. Anselm Pancoast, MD  Pager: [phone]

## 2019-05-02 NOTE — Progress Notes (Signed)
[facility]   Telephone:([phone] Fax:(336) [phone]   Clinic Follow up Note   Patient Care Team: Lajean Manes, MD as PCP - General (Internal Medicine) Berle Mull, MD as Consulting Physician (Family Medicine) Clarene Essex, MD as Consulting Physician (Gastroenterology)  Date of Service:  05/03/2019  CHIEF COMPLAINT: F/u of metastatic colon cancer  SUMMARY OF ONCOLOGIC HISTORY: Oncology History   Cancer Staging Cancer of right colon [facility]) Staging form: Colon and Rectum, AJCC 8th Edition - Clinical stage from 04/09/2019: Stage IVC (cTX, cNX, pM1c) - Signed by Truitt Merle, MD on 05/03/2019       Cancer of right colon [facility])   04/09/2019 Procedure    Colonoscopy 04/09/19 by Dr Watt Climes IMPRESSION -internal hemorrhoids -Diverticulosis in the sigmoid colon  -2 small polyps in the rectum and in the proximal transverse colon, removed with a hot snare. Resected and retrieved.  -3 medium polyps in the proximal transverse colon, in the mid transverse colon and in the distal transverse colon, removed and resected and retrieved.  -likely malignant partially obstructing tumor at the hepatic flexure. biopsied, tattooed.  -1 large polyp in the mid ascending colon  -the examination was otherwise normal      04/09/2019 Initial Biopsy    FINAL MICROSCOPIC DIAGNOSIS: 04/09/19 1. LG intestine-hepatic flexure, Biopsy:   INVASIVE WELL DIFFERENTIATED ADENOCARCINOMA    04/09/2019 Cancer Staging    Staging form: Colon and Rectum, AJCC 8th Edition - Clinical stage from 04/09/2019: Stage IVC (cTX, cNX, pM1c) - Signed by Truitt Merle, MD on 05/03/2019    04/10/2019 Imaging    CT AP 04/10/19  IMPRESSION: 1. There is an eccentric mass of the colon involving the ascending colon near the hepatic flexure measuring approximately 3.4 x 3.4 by 2.0 cm (series 2, image 37, series 3, image 37). There is extensive soft tissue nodularity of the mesocolon and omentum, and likely areas of the peritoneum,  for example bilateral upper quadrants (series 2, image 25). Findings are consistent with primary colon malignancy, probable omental and peritoneal involvement, and small volume malignant ascites. 2.  Other chronic and incidental findings as detailed above.    04/20/2019 Initial Diagnosis    Cancer of right colon ([place])    04/26/2019 Imaging    CT Chest 04/26/19 IMPRESSION: 1. Borderline to mild lower thoracic adenopathy, including within the right internal mammary and juxta cardiophrenic stations. Given the appearance of the upper abdomen, suspicious for nodal metastasis. 2. A low right paratracheal node is borderline sized, but favored to be reactive. 3. No evidence of pulmonary metastasis. 4. Peritoneal metastasis and abdominal ascites, as before. 5. Pancreatic parenchymal calcifications indicative of chronic calcific pancreatitis. 6. Coronary artery atherosclerosis.    04/27/2019 Pathology Results    Diagnosis 04/27/19 Peritoneum, biopsy, right upper quadrant, perihepatic - ADENOCARCINOMA, CONSISTENT WITH COLONIC PRIMARY. - SEE COMMENT.      CURRENT THERAPY:  PENDING first line FOLFOX every 2 weeks with Avastin starting with cycle 2  INTERVAL HISTORY:  Nathaniel Norton is here for a follow up of colon cancer. He is here alone. He notes he feels fine. He notes if he does a lot he will get tired. He notes he continues to have loose stool or diarrhea. He has 2-3 BMs. He denies blood. He lost 3 pounds over a few weeks. He notes eating increases his indigestion so he does not eat much. He notes he normally does not have neuropathy, but notes having neurology problem in his right shoulder 4 years ago  which resolved over a few month. He notes he still feel sensations of her left palm.    REVIEW OF SYSTEMS:   Constitutional: Denies fevers, chills (+) weight loss  Eyes: Denies blurriness of vision Ears, nose, mouth, throat, and face: Denies mucositis or sore throat Respiratory: Denies  cough, dyspnea or wheezes Cardiovascular: Denies palpitation, chest discomfort or lower extremity swelling Gastrointestinal:  Denies nausea, heartburn (+) loose stool/diarrhea  Skin: Denies abnormal skin rashes Lymphatics: Denies new lymphadenopathy or easy bruising Neurological:Denies numbness, tingling or new weaknesses Behavioral/Psych: Mood is stable, no new changes  All other systems were reviewed with the patient and are negative.  MEDICAL HISTORY:  History reviewed. No pertinent past medical history.  SURGICAL HISTORY: Past Surgical History:  Procedure Laterality Date  . CATARACT EXTRACTION    . L4 fracture  2012  . RETINAL DETACHMENT SURGERY    . Right hand surgery  1974    I have reviewed the social history and family history with the patient and they are unchanged from previous note.  ALLERGIES:  is allergic to codeine.  MEDICATIONS:  Current Outpatient Medications  Medication Sig Dispense Refill  . ferrous sulfate 325 (65 FE) MG tablet Take 325 mg by mouth daily with breakfast.    . Multiple Vitamin (MULTIVITAMIN) tablet Take 1 tablet by mouth daily.     No current facility-administered medications for this visit.     PHYSICAL EXAMINATION: ECOG PERFORMANCE STATUS: 1 - Symptomatic but completely ambulatory  Vitals:   05/03/19 1358  BP: (!) 145/82  Pulse: 80  Resp: 18  Temp: 98.2 F (36.8 C)  SpO2: 98%   Filed Weights   05/03/19 1358  Weight: 169 lb 14.4 oz (77.1 kg)    GENERAL:alert, no distress and comfortable SKIN: skin color, texture, turgor are normal, no rashes or significant lesions EYES: normal, Conjunctiva are pink and non-injected, sclera clear OROPHARYNX:no exudate, no erythema and lips, buccal mucosa, and tongue normal  NECK: supple, thyroid normal size, non-tender, without nodularity LYMPH:  no palpable lymphadenopathy in the cervical, axillary or inguinal LUNGS: clear to auscultation and percussion with normal breathing effort HEART:  regular rate & rhythm and no murmurs and no lower extremity edema ABDOMEN:abdomen soft, non-tender and normal bowel sounds Musculoskeletal:no cyanosis of digits and no clubbing  NEURO: alert & oriented x 3 with fluent speech, no focal motor/sensory deficits  LABORATORY DATA:  I have reviewed the data as listed CBC Latest Ref Rng & Units 04/27/2019 04/20/2019 12/24/2013  WBC 4.0 - 10.5 K/uL 6.5 5.8 13.0(H)  Hemoglobin 13.0 - 17.0 g/dL 9.4(L) 8.0(L) 14.8  Hematocrit 39.0 - 52.0 % 35.6(L) 29.4(L) 42.9  Platelets 150 - 400 K/uL 314 318 186     CMP Latest Ref Rng & Units 04/20/2019 12/24/2013  Glucose 70 - 99 mg/dL 120(H) 96  BUN 8 - 23 mg/dL 10 22  Creatinine 0.61 - 1.24 mg/dL 1.04 0.87  Sodium 135 - 145 mmol/L 140 144  Potassium 3.5 - 5.1 mmol/L 4.2 4.5  Chloride 98 - 111 mmol/L 103 100  CO2 22 - 32 mmol/L 26 24  Calcium 8.9 - 10.3 mg/dL 8.9 9.6  Total Protein 6.5 - 8.1 g/dL 6.9 7.5  Total Bilirubin 0.3 - 1.2 mg/dL 0.7 0.9  Alkaline Phos 38 - 126 U/L 71 49  AST 15 - 41 U/L 13(L) 22  ALT 0 - 44 U/L 12 29      RADIOGRAPHIC STUDIES: I have personally reviewed the radiological images as listed and agreed  with the findings in the report. No results found.   ASSESSMENT & PLAN:  Kienan Doublin is a 68 y.o. male with   1. Cancer of right colon, with peritoneal metastasis, stage IV,  MMR normal -I reviewed his image finding and pathology reports with patient in great detail. His Colonoscopy biopsy showed adenocarcinoma of right hepatic flexure colon.   -His CT AP shows 3.4 cm colon mass which is partially obstructing his bowel, and soft tissue nodularity of mesocolon and omentum and peritoneum which is concerning for metastatic cancer and small ascites. He has no clinical signs of bowel obstruction  -His CT chest from 04/26/19 shows indeterminate LN, but overall no evidence of metastasis in chest.  -His peritoneal biopsy from 04/27/19 shows adenocarcinoma, consistent with metastasis of his  colon cancer. This is now stage IV disease which is no longer curable but still treatable.  -I will request [facility] for genomic testing to see if he is eligible for target therapy. His tumor has normal MMR, he is not a candidate for immunotherapy.  -I recommend systemic therapy for metastatic disease. If he responds well to chemotherapy, he maybe eligible for HIPAC surgery, although this won't be easy due to his advanced age.  -I reviewed chemo options of FOLFOX or FOLFIRI q2weeks or CAPOX q3weeks. I recommend FOLFOX every 2 weeks with biological agent Avastin starting with cycle 2.    --Chemotherapy consent: Side effects including but does not limited to, fatigue, nausea, vomiting, diarrhea, hair loss, neuropathy, fluid retention, renal and kidney dysfunction, neutropenic fever, needed for blood transfusion, bleeding, thrombosis, hypertension, proteinuria, bowel perforation, etc  were discussed with patient in great detail. He agrees to proceed. -the goal of therapy is palliative to prolong his life and improve his quality of life -His weight has been trending down. I encouraged him to eat high-protein and high-calorie diet with small frequent meals and nutrition supplements.such as boosts, ensure.  -I encouraged him to watch for bowel obstruction as he may need surgery for removal.  -He will proceed with education class before start of treatment -F/u week of 6/1 before fist cycle chemo   2. 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  -GI workup with coloscopy by Dr. Watt Climes showed internal hemorrhoids, benign polyps, diverticulosis and colon cancer.  -He was started on OTC multivitamin and oral iron in late 03/2019 and has been having black stool. This is likely from oral iron.  -Baseline labs show Ferritin 13, iron WNL, Hg 8.0 (04/20/19). Will give IV Feraheme today. I previously reviewed side effects such as possible  allergy reaction. He is agreeable to proceed.   3. Elevated PSA  -PSA was 5.7 in 11/2018 and 4.6 in 01/2019 -He plans to wait on seeing Urologist for work up given colon cancer diagnosis. This is understandable.   4. Social Support  -He is single with no children and no family that lives in town.   -He lives alone in 1 level home in [place].  -He does not have Internet access at home  -He does have a cell phone which he can be reached.  -If he needs help with transportation for treatment, I discussed our social workers can help him.  -I encouraged him to find someone to help him as needed at home.   5. Goal of care discussion  -We again discussed the incurable nature of his cancer, and the overall poor prognosis, especially  if he does not have good response to chemotherapy or progress on chemo -The patient understands the goal of care is palliative. -he is full code now   PLAN:  -I called in EMLA cream and antiemetics -Will give IV Feraheme today and second dose with first cycle chemo  -Chemo class next week -Lab, flush, f/u with me or Lacie and chemo FOLFOX on 6/1 or 6/2, then every 2 weeks X3 (add Avastin from cycle 2) -IR port placement next week   -SW to arrange transportation for chemo day 1    No problem-specific Assessment & Plan notes found for this encounter.   Orders Placed This Encounter  Procedures  . IR Perc Tun Perit Cath W/Port    Standing Status:   Future    Standing Expiration Date:   07/02/2020    Order Specific Question:   Reason for exam:    Answer:   chemo    Order Specific Question:   Preferred Imaging Location?    Answer:   [facility]   All questions were answered. The patient knows to call the clinic with any problems, questions or concerns. No barriers to learning was detected. I spent 25 minutes counseling the patient face to face. The total time spent in the appointment was 40 minutes and more than 50% was on counseling and  review of test results     Truitt Merle, MD 05/03/2019   I, Joslyn Devon, am acting as scribe for Truitt Merle, MD.   I have reviewed the above documentation for accuracy and completeness, and I agree with the above.

## 2019-05-03 ENCOUNTER — Inpatient Hospital Stay (HOSPITAL_BASED_OUTPATIENT_CLINIC_OR_DEPARTMENT_OTHER): Payer: Medicare Other | Admitting: Hematology

## 2019-05-03 ENCOUNTER — Encounter: Payer: Self-pay | Admitting: Hematology

## 2019-05-03 ENCOUNTER — Other Ambulatory Visit: Payer: Self-pay

## 2019-05-03 ENCOUNTER — Inpatient Hospital Stay: Payer: Medicare Other

## 2019-05-03 VITALS — BP 125/78 | HR 68 | Temp 98.4°F

## 2019-05-03 VITALS — BP 145/82 | HR 80 | Temp 98.2°F | Resp 18 | Ht 66.0 in | Wt 169.9 lb

## 2019-05-03 DIAGNOSIS — R590 Localized enlarged lymph nodes: Secondary | ICD-10-CM

## 2019-05-03 DIAGNOSIS — K30 Functional dyspepsia: Secondary | ICD-10-CM

## 2019-05-03 DIAGNOSIS — C182 Malignant neoplasm of ascending colon: Secondary | ICD-10-CM

## 2019-05-03 DIAGNOSIS — R972 Elevated prostate specific antigen [PSA]: Secondary | ICD-10-CM | POA: Diagnosis not present

## 2019-05-03 DIAGNOSIS — R0602 Shortness of breath: Secondary | ICD-10-CM

## 2019-05-03 DIAGNOSIS — N200 Calculus of kidney: Secondary | ICD-10-CM

## 2019-05-03 DIAGNOSIS — M549 Dorsalgia, unspecified: Secondary | ICD-10-CM | POA: Diagnosis not present

## 2019-05-03 DIAGNOSIS — D500 Iron deficiency anemia secondary to blood loss (chronic): Secondary | ICD-10-CM | POA: Diagnosis not present

## 2019-05-03 DIAGNOSIS — Z8249 Family history of ischemic heart disease and other diseases of the circulatory system: Secondary | ICD-10-CM

## 2019-05-03 DIAGNOSIS — K921 Melena: Secondary | ICD-10-CM | POA: Diagnosis not present

## 2019-05-03 DIAGNOSIS — K648 Other hemorrhoids: Secondary | ICD-10-CM | POA: Diagnosis not present

## 2019-05-03 DIAGNOSIS — Z7189 Other specified counseling: Secondary | ICD-10-CM | POA: Insufficient documentation

## 2019-05-03 DIAGNOSIS — G8929 Other chronic pain: Secondary | ICD-10-CM | POA: Diagnosis not present

## 2019-05-03 DIAGNOSIS — C786 Secondary malignant neoplasm of retroperitoneum and peritoneum: Secondary | ICD-10-CM | POA: Diagnosis not present

## 2019-05-03 DIAGNOSIS — L719 Rosacea, unspecified: Secondary | ICD-10-CM

## 2019-05-03 DIAGNOSIS — R0789 Other chest pain: Secondary | ICD-10-CM | POA: Diagnosis not present

## 2019-05-03 DIAGNOSIS — K4090 Unilateral inguinal hernia, without obstruction or gangrene, not specified as recurrent: Secondary | ICD-10-CM

## 2019-05-03 DIAGNOSIS — I2510 Atherosclerotic heart disease of native coronary artery without angina pectoris: Secondary | ICD-10-CM

## 2019-05-03 DIAGNOSIS — Z823 Family history of stroke: Secondary | ICD-10-CM

## 2019-05-03 DIAGNOSIS — Z885 Allergy status to narcotic agent status: Secondary | ICD-10-CM

## 2019-05-03 MED ORDER — SODIUM CHLORIDE 0.9 % IV SOLN
510.0000 mg | Freq: Once | INTRAVENOUS | Status: AC
  Administered 2019-05-03: 510 mg via INTRAVENOUS
  Filled 2019-05-03: qty 17

## 2019-05-03 MED ORDER — ONDANSETRON HCL 8 MG PO TABS: 8.0000 mg | ORAL_TABLET | Freq: Two times a day (BID) | ORAL | 1 refills | Status: DC | PRN

## 2019-05-03 MED ORDER — PROCHLORPERAZINE MALEATE 10 MG PO TABS: 10.0000 mg | ORAL_TABLET | Freq: Four times a day (QID) | ORAL | 1 refills | Status: DC | PRN

## 2019-05-03 MED ORDER — LIDOCAINE-PRILOCAINE 2.5-2.5 % EX CREA: TOPICAL_CREAM | CUTANEOUS | 3 refills | Status: DC

## 2019-05-03 MED ORDER — SODIUM CHLORIDE 0.9 % IV SOLN
Freq: Once | INTRAVENOUS | Status: AC
  Administered 2019-05-03: 15:00:00 via INTRAVENOUS
  Filled 2019-05-03: qty 250

## 2019-05-03 NOTE — Patient Instructions (Signed)
Coronavirus (COVID-19) Are you at risk?  Are you at risk for the Coronavirus (COVID-19)?  To be considered HIGH RISK for Coronavirus (COVID-19), you have to meet the following criteria:  . Traveled to [place], [place], [place], [place] or [place]; or in the [place] to [place], [place], [place], or [place]; and have fever, cough, and shortness of breath within the last 2 weeks of travel OR . Been in close contact with a person diagnosed with COVID-19 within the last 2 weeks and have fever, cough, and shortness of breath . IF YOU DO NOT MEET THESE CRITERIA, YOU ARE CONSIDERED LOW RISK FOR COVID-19.  What to do if you are HIGH RISK for COVID-19?  . If you are having a medical emergency, call 911. . Seek medical care right away. Before you go to a doctor's office, urgent care or emergency department, call ahead and tell them about your recent travel, contact with someone diagnosed with COVID-19, and your symptoms. You should receive instructions from your physician's office regarding next steps of care.  . When you arrive at healthcare provider, tell the healthcare staff immediately you have returned from visiting [place], [place], [place], [place] or [place]; or traveled in the [place] to [place], [place], [place], or [place]; in the last two weeks or you have been in close contact with a person diagnosed with COVID-19 in the last 2 weeks.   . Tell the health care staff about your symptoms: fever, cough and shortness of breath. . After you have been seen by a medical provider, you will be either: o Tested for (COVID-19) and discharged home on quarantine except to seek medical care if symptoms worsen, and asked to  - Stay home and avoid contact with others until you get your results (4-5 days)  - Avoid travel on public transportation if possible (such as bus, train, or airplane) or o Sent to the Emergency Department by EMS for evaluation, COVID-19 testing, and possible  admission depending on your condition and test results.  What to do if you are LOW RISK for COVID-19?  Reduce your risk of any infection by using the same precautions used for avoiding the common cold or flu:  . Wash your hands often with soap and warm water for at least 20 seconds.  If soap and water are not readily available, use an alcohol-based hand sanitizer with at least 60% alcohol.  . If coughing or sneezing, cover your mouth and nose by coughing or sneezing into the elbow areas of your shirt or coat, into a tissue or into your sleeve (not your hands). . Avoid shaking hands with others and consider head nods or verbal greetings only. . Avoid touching your eyes, nose, or mouth with unwashed hands.  . Avoid close contact with people who are sick. . Avoid places or events with large numbers of people in one location, like concerts or sporting events. . Carefully consider travel plans you have or are making. . If you are planning any travel outside or inside the [place], visit the CDC's Travelers' Health webpage for the latest health notices. . If you have some symptoms but not all symptoms, continue to monitor at home and seek medical attention if your symptoms worsen. . If you are having a medical emergency, call 911.  ADDITIONAL HEALTHCARE OPTIONS FOR PATIENTS  [facility] / e-Visit: [email or web address]         [facility]: [phone]  [facility]: [phone]                   [facility]: [phone]   Ferumoxytol injection What is this medicine? FERUMOXYTOL is an iron complex. Iron is used to make healthy red blood cells, which carry oxygen and nutrients throughout the body. This medicine is used to treat iron deficiency anemia. This medicine may be used for other purposes; ask your health care provider or pharmacist if you have questions. COMMON BRAND NAME(S): Feraheme What should I  tell my health care provider before I take this medicine? They need to know if you have any of these conditions: -anemia not caused by low iron levels -high levels of iron in the blood -magnetic resonance imaging (MRI) test scheduled -an unusual or allergic reaction to iron, other medicines, foods, dyes, or preservatives -pregnant or trying to get pregnant -breast-feeding How should I use this medicine? This medicine is for injection into a vein. It is given by a health care professional in a hospital or clinic setting. Talk to your pediatrician regarding the use of this medicine in children. Special care may be needed. Overdosage: If you think you have taken too much of this medicine contact a poison control center or emergency room at once. NOTE: This medicine is only for you. Do not share this medicine with others. What if I miss a dose? It is important not to miss your dose. Call your doctor or health care professional if you are unable to keep an appointment. What may interact with this medicine? This medicine may interact with the following medications: -other iron products This list may not describe all possible interactions. Give your health care provider a list of all the medicines, herbs, non-prescription drugs, or dietary supplements you use. Also tell them if you smoke, drink alcohol, or use illegal drugs. Some items may interact with your medicine. What should I watch for while using this medicine? Visit your doctor or healthcare professional regularly. Tell your doctor or healthcare professional if your symptoms do not start to get better or if they get worse. You may need blood work done while you are taking this medicine. You may need to follow a special diet. Talk to your doctor. Foods that contain iron include: whole grains/cereals, dried fruits, beans, or peas, leafy green vegetables, and organ meats (liver, kidney). What side effects may I notice from receiving this  medicine? Side effects that you should report to your doctor or health care professional as soon as possible: -allergic reactions like skin rash, itching or hives, swelling of the face, lips, or tongue -breathing problems -changes in blood pressure -feeling faint or lightheaded, falls -fever or chills -flushing, sweating, or hot feelings -swelling of the ankles or feet Side effects that usually do not require medical attention (report to your doctor or health care professional if they continue or are bothersome): -diarrhea -headache -nausea, vomiting -stomach pain This list may not describe all possible side effects. Call your doctor for medical advice about side effects. You may report side effects to FDA at 1-800-FDA-1088. Where should I keep my medicine? This drug is given in a hospital or clinic and will not be stored at home. NOTE: This sheet is a summary. It may not cover all possible information. If you have questions about this medicine, talk to your doctor, pharmacist, or health care provider.  2019 Elsevier/Gold Standard (2017-01-17 20:21:10)  

## 2019-05-03 NOTE — Progress Notes (Signed)
START ON PATHWAY REGIMEN - Colorectal     A cycle is every 14 days:     Oxaliplatin      Leucovorin      5-Fluorouracil      5-Fluorouracil      Bevacizumab-xxxx   **Always confirm dose/schedule in your pharmacy ordering system**  Patient Characteristics: Distant Metastases, First Line, Nonsurgical Candidate, KRAS Mutation Positive/Unknown, BRAF Wild-Type/Unknown, PS = 0,1; Bevacizumab Eligible Therapeutic Status: Distant Metastases BRAF Mutation Status: Awaiting Test Results KRAS/NRAS Mutation Status: Awaiting Test Results Line of Therapy: First Line ECOG Performance Status: 1 Bevacizumab Eligibility: Eligible Intent of Therapy: Non-Curative / Palliative Intent, Discussed with Patient 

## 2019-05-08 ENCOUNTER — Telehealth: Payer: Self-pay | Admitting: Hematology

## 2019-05-08 ENCOUNTER — Other Ambulatory Visit: Payer: Self-pay | Admitting: Radiology

## 2019-05-08 DIAGNOSIS — C801 Malignant (primary) neoplasm, unspecified: Secondary | ICD-10-CM | POA: Diagnosis not present

## 2019-05-08 DIAGNOSIS — C786 Secondary malignant neoplasm of retroperitoneum and peritoneum: Secondary | ICD-10-CM | POA: Diagnosis not present

## 2019-05-08 NOTE — Telephone Encounter (Signed)
Scheduled appt per 5/21 los.  Spoke with patient and patient aware of appt date and time.  Added treatment to the book for approval.

## 2019-05-09 ENCOUNTER — Other Ambulatory Visit: Payer: Self-pay | Admitting: Radiology

## 2019-05-10 ENCOUNTER — Ambulatory Visit (HOSPITAL_COMMUNITY)
Admission: RE | Admit: 2019-05-10 | Discharge: 2019-05-10 | Disposition: A | Discharge status: Home / Self Care | Payer: Medicare Other | Source: Ambulatory Visit | Attending: Hematology | Admitting: Hematology

## 2019-05-10 ENCOUNTER — Other Ambulatory Visit: Payer: Medicare Other

## 2019-05-10 ENCOUNTER — Other Ambulatory Visit: Payer: Self-pay

## 2019-05-10 ENCOUNTER — Other Ambulatory Visit: Payer: Self-pay | Admitting: Hematology

## 2019-05-10 ENCOUNTER — Encounter (HOSPITAL_COMMUNITY): Payer: Self-pay

## 2019-05-10 DIAGNOSIS — C189 Malignant neoplasm of colon, unspecified: Secondary | ICD-10-CM | POA: Diagnosis not present

## 2019-05-10 DIAGNOSIS — Z452 Encounter for adjustment and management of vascular access device: Secondary | ICD-10-CM | POA: Diagnosis not present

## 2019-05-10 DIAGNOSIS — C182 Malignant neoplasm of ascending colon: Secondary | ICD-10-CM | POA: Insufficient documentation

## 2019-05-10 DIAGNOSIS — Z885 Allergy status to narcotic agent status: Secondary | ICD-10-CM | POA: Diagnosis not present

## 2019-05-10 DIAGNOSIS — C786 Secondary malignant neoplasm of retroperitoneum and peritoneum: Secondary | ICD-10-CM | POA: Diagnosis not present

## 2019-05-10 HISTORY — PX: IR IMAGING GUIDED PORT INSERTION: IMG5740

## 2019-05-10 LAB — CBC WITH DIFFERENTIAL/PLATELET
Abs Immature Granulocytes: 0.01 10*3/uL (ref 0.00–0.07)
Basophils Absolute: 0 10*3/uL (ref 0.0–0.1)
Basophils Relative: 1 %
Eosinophils Absolute: 0.1 10*3/uL (ref 0.0–0.5)
Eosinophils Relative: 1 %
HCT: 35.8 % — ABNORMAL LOW (ref 39.0–52.0)
Hemoglobin: 10.2 g/dL — ABNORMAL LOW (ref 13.0–17.0)
Immature Granulocytes: 0 %
Lymphocytes Relative: 19 %
Lymphs Abs: 1.1 10*3/uL (ref 0.7–4.0)
MCH: 20.8 pg — ABNORMAL LOW (ref 26.0–34.0)
MCHC: 28.5 g/dL — ABNORMAL LOW (ref 30.0–36.0)
MCV: 73.1 fL — ABNORMAL LOW (ref 80.0–100.0)
Monocytes Absolute: 0.5 10*3/uL (ref 0.1–1.0)
Monocytes Relative: 9 %
Neutro Abs: 4.1 10*3/uL (ref 1.7–7.7)
Neutrophils Relative %: 70 %
Platelets: 229 10*3/uL (ref 150–400)
RBC: 4.9 MIL/uL (ref 4.22–5.81)
RDW: 28.8 % — ABNORMAL HIGH (ref 11.5–15.5)
WBC: 5.8 10*3/uL (ref 4.0–10.5)
nRBC: 0 % (ref 0.0–0.2)

## 2019-05-10 LAB — PROTIME-INR
INR: 1.1 (ref 0.8–1.2)
Prothrombin Time: 13.6 seconds (ref 11.4–15.2)

## 2019-05-10 MED ORDER — MIDAZOLAM HCL 2 MG/2ML IJ SOLN
INTRAMUSCULAR | Status: AC
  Filled 2019-05-10: qty 4

## 2019-05-10 MED ORDER — FENTANYL CITRATE (PF) 100 MCG/2ML IJ SOLN
INTRAMUSCULAR | Status: AC | PRN
  Administered 2019-05-10 (×2): 50 ug via INTRAVENOUS

## 2019-05-10 MED ORDER — HEPARIN SOD (PORK) LOCK FLUSH 100 UNIT/ML IV SOLN
INTRAVENOUS | Status: AC
  Filled 2019-05-10: qty 5

## 2019-05-10 MED ORDER — LIDOCAINE-EPINEPHRINE 1 %-1:100000 IJ SOLN
INTRAMUSCULAR | Status: AC
  Filled 2019-05-10: qty 1

## 2019-05-10 MED ORDER — CEFAZOLIN SODIUM-DEXTROSE 2-4 GM/100ML-% IV SOLN
2.0000 g | INTRAVENOUS | Status: AC
  Administered 2019-05-10: 15:00:00 2 g via INTRAVENOUS

## 2019-05-10 MED ORDER — LIDOCAINE-EPINEPHRINE 2 %-1:100000 IJ SOLN
INTRAMUSCULAR | Status: AC | PRN
  Administered 2019-05-10 (×2): 10 mL

## 2019-05-10 MED ORDER — FENTANYL CITRATE (PF) 100 MCG/2ML IJ SOLN
INTRAMUSCULAR | Status: AC
  Filled 2019-05-10: qty 2

## 2019-05-10 MED ORDER — CEFAZOLIN SODIUM-DEXTROSE 2-4 GM/100ML-% IV SOLN
INTRAVENOUS | Status: AC
  Administered 2019-05-10: 15:00:00 2 g via INTRAVENOUS
  Filled 2019-05-10: qty 100

## 2019-05-10 MED ORDER — MIDAZOLAM HCL 2 MG/2ML IJ SOLN
INTRAMUSCULAR | Status: AC | PRN
  Administered 2019-05-10: 2 mg via INTRAVENOUS
  Administered 2019-05-10 (×2): 1 mg via INTRAVENOUS

## 2019-05-10 MED ORDER — SODIUM CHLORIDE 0.9 % IV SOLN
INTRAVENOUS | Status: DC
  Administered 2019-05-10: 13:00:00 via INTRAVENOUS

## 2019-05-10 NOTE — H&P (Signed)
    Referring Physician(s): Feng,Yan  Supervising Physician: Sandi Mariscal  Patient Status:  [facility]  Chief Complaint:  "I'm here for a port a cath"  Subjective: Patient familiar to IR service from prior peritoneal-omental biopsy on 04/27/2019.  He has a history of stage IV metastatic colon cancer and presents again today for Port-A-Cath placement for palliative chemotherapy.  He currently denies fever, headache, chest pain, cough, abdominal/back pain, nausea, vomiting or bleeding.  He does have some occasional dyspnea with exertion.  History reviewed. No pertinent past medical history. Past Surgical History:  Procedure Laterality Date  . CATARACT EXTRACTION    . L4 fracture  2012  . RETINAL DETACHMENT SURGERY    . Right hand surgery  1974      Allergies: Codeine  Medications: Prior to Admission medications   Medication Sig Start Date End Date Taking? Authorizing Provider  ferrous sulfate 325 (65 FE) MG tablet Take 325 mg by mouth daily with breakfast.    [provider]  lidocaine-prilocaine (EMLA) cream Apply to affected area once 05/03/19   Truitt Merle, MD  Multiple Vitamin (MULTIVITAMIN) tablet Take 1 tablet by mouth daily.    [provider]  ondansetron (ZOFRAN) 8 MG tablet Take 1 tablet (8 mg total) by mouth 2 (two) times daily as needed for refractory nausea / vomiting. Start on day 3 after chemotherapy. 05/03/19   Truitt Merle, MD  prochlorperazine (COMPAZINE) 10 MG tablet Take 1 tablet (10 mg total) by mouth every 6 (six) hours as needed (Nausea or vomiting). 05/03/19   Truitt Merle, MD     Vital Signs: Blood pressure 129/78, heart rate 76, respirations 16, O2 sat 98% room air, temp 99.4   Physical Exam awake, alert.  Chest clear to auscultation bilaterally.  Heart with regular rate and rhythm.  Abdomen soft, positive bowel sounds, nontender.  No significant lower extremity edema.   Imaging: No results found.  Labs:  CBC: Recent Labs    04/20/19 1529  04/27/19 0722  WBC 5.8 6.5  HGB 8.0* 9.4*  HCT 29.4* 35.6*  PLT 318 314    COAGS: Recent Labs    04/27/19 0722  INR 1.0  APTT 29    BMP: Recent Labs    04/20/19 1529  NA 140  K 4.2  CL 103  CO2 26  GLUCOSE 120*  BUN 10  CALCIUM 8.9  CREATININE 1.04  GFRNONAA >60  GFRAA >60    LIVER FUNCTION TESTS: Recent Labs    04/20/19 1529  BILITOT 0.7  AST 13*  ALT 12  ALKPHOS 71  PROT 6.9  ALBUMIN 3.6    Assessment and Plan: Patient with history of stage IV colon cancer; presents today for Port-A-Cath placement for palliative chemotherapy.Risks and benefits of image guided port-a-catheter placement was discussed with the patient including, but not limited to bleeding, infection, pneumothorax, or fibrin sheath development and need for additional procedures.  All of the patient's questions were answered, patient is agreeable to proceed. Consent signed and in chart.     Electronically Signed: D. Rowe Robert, PA-C 05/10/2019, 12:47 PM   I spent a total of 25 minutes at the the patient's bedside AND on the patient's hospital floor or unit, greater than 50% of which was counseling/coordinating care for port a cath placement

## 2019-05-10 NOTE — Procedures (Signed)
Pre Procedure Dx: Poor venous access Post Procedural Dx: Same  Successful placement of right IJ approach port-a-cath with tip at the superior caval atrial junction. The catheter is ready for immediate use.  Estimated Blood Loss: Minimal  Complications: None immediate.  Jay Saadiq Poche, MD Pager #: [phone]   

## 2019-05-10 NOTE — Discharge Instructions (Signed)
Moderate Conscious Sedation, Adult, Care After °These instructions provide you with information about caring for yourself after your procedure. Your health care provider may also give you more specific instructions. Your treatment has been planned according to current medical practices, but problems sometimes occur. Call your health care provider if you have any problems or questions after your procedure. °What can I expect after the procedure? °After your procedure, it is common: °· To feel sleepy for several hours. °· To feel clumsy and have poor balance for several hours. °· To have poor judgment for several hours. °· To vomit if you eat too soon. °Follow these instructions at home: °For at least 24 hours after the procedure: ° °· Do not: °? Participate in activities where you could fall or become injured. °? Drive. °? Use heavy machinery. °? Drink alcohol. °? Take sleeping pills or medicines that cause drowsiness. °? Make important decisions or sign legal documents. °? Take care of children on your own. °· Rest. °Eating and drinking °· Follow the diet recommended by your health care provider. °· If you vomit: °? Drink water, juice, or soup when you can drink without vomiting. °? Make sure you have little or no nausea before eating solid foods. °General instructions °· Have a responsible adult stay with you until you are awake and alert. °· Take over-the-counter and prescription medicines only as told by your health care provider. °· If you smoke, do not smoke without supervision. °· Keep all follow-up visits as told by your health care provider. This is important. °Contact a health care provider if: °· You keep feeling nauseous or you keep vomiting. °· You feel light-headed. °· You develop a rash. °· You have a fever. °Get help right away if: °· You have trouble breathing. °This information is not intended to replace advice given to you by your health care provider. Make sure you discuss any questions you have  with your health care provider. °Document Released: 09/19/2013 Document Revised: 05/03/2016 Document Reviewed: 03/20/2016 °Elsevier Interactive Patient Education © 2019 Elsevier Inc. ° ° °Implanted Port Insertion, Care After °This sheet gives you information about how to care for yourself after your procedure. Your health care provider may also give you more specific instructions. If you have problems or questions, contact your health care provider. °What can I expect after the procedure? °After the procedure, it is common to have: °· Discomfort at the port insertion site. °· Bruising on the skin over the port. This should improve over 3-4 days. °Follow these instructions at home: °Port care °· After your port is placed, you will get a manufacturer's information card. The card has information about your port. Keep this card with you at all times. °· Take care of the port as told by your health care provider. Ask your health care provider if you or a family member can get training for taking care of the port at home. A home health care nurse may also take care of the port. °· Make sure to remember what type of port you have. °Incision care ° °  ° °· Follow instructions from your health care provider about how to take care of your port insertion site. Make sure you: °? Wash your hands with soap and water before and after you change your bandage (dressing). If soap and water are not available, use hand sanitizer. °? Change your dressing as told by your health care provider.  You may remove your dressing tomorrow. °? Leave stitches (sutures), skin   glue, or adhesive strips in place. These skin closures may need to stay in place for 2 weeks or longer. If adhesive strip edges start to loosen and curl up, you may trim the loose edges. Do not remove adhesive strips completely unless your health care provider tells you to do that.  DO NOT use EMLA cream for 2 weeks after port placement as this cream will remove surgical glue  on your incision. °· Check your port insertion site every day for signs of infection. Check for: °? Redness, swelling, or pain. °? Fluid or blood. °? Warmth. °? Pus or a bad smell. °Activity °· Return to your normal activities as told by your health care provider. Ask your health care provider what activities are safe for you. °· Do not lift anything that is heavier than 10 lb (4.5 kg), or the limit that you are told, until your health care provider says that it is safe. °General instructions °· Take over-the-counter and prescription medicines only as told by your health care provider. °· Do not take baths, swim, or use a hot tub until your health care provider approves. Ask your health care provider if you may take showers. You may only be allowed to take sponge baths.  You may shower tomorrow. °· Do not drive for 24 hours if you were given a sedative during your procedure. °· Wear a medical alert bracelet in case of an emergency. This will tell any health care providers that you have a port. °· Keep all follow-up visits as told by your health care provider. This is important. °Contact a health care provider if: °· You cannot flush your port with saline as directed, or you cannot draw blood from the port. °· You have a fever or chills. °· You have redness, swelling, or pain around your port insertion site. °· You have fluid or blood coming from your port insertion site. °· Your port insertion site feels warm to the touch. °· You have pus or a bad smell coming from the port insertion site. °Get help right away if: °· You have chest pain or shortness of breath. °· You have bleeding from your port that you cannot control. °Summary °· Take care of the port as told by your health care provider. Keep the manufacturer's information card with you at all times. °· Change your dressing as told by your health care provider. °· Contact a health care provider if you have a fever or chills or if you have redness, swelling, or  pain around your port insertion site. °· Keep all follow-up visits as told by your health care provider. °This information is not intended to replace advice given to you by your health care provider. Make sure you discuss any questions you have with your health care provider. °Document Released: 09/19/2013 Document Revised: 06/27/2018 Document Reviewed: 06/27/2018 °Elsevier Interactive Patient Education © 2019 Elsevier Inc. ° °

## 2019-05-11 ENCOUNTER — Telehealth: Payer: Self-pay | Admitting: *Deleted

## 2019-05-11 ENCOUNTER — Inpatient Hospital Stay: Payer: Medicare Other

## 2019-05-16 ENCOUNTER — Inpatient Hospital Stay (HOSPITAL_BASED_OUTPATIENT_CLINIC_OR_DEPARTMENT_OTHER): Payer: Medicare Other | Admitting: Nurse Practitioner

## 2019-05-16 ENCOUNTER — Inpatient Hospital Stay: Payer: Medicare Other | Attending: Hematology

## 2019-05-16 ENCOUNTER — Inpatient Hospital Stay: Payer: Medicare Other

## 2019-05-16 ENCOUNTER — Inpatient Hospital Stay (HOSPITAL_BASED_OUTPATIENT_CLINIC_OR_DEPARTMENT_OTHER): Payer: Medicare Other | Admitting: Medical

## 2019-05-16 ENCOUNTER — Encounter: Payer: Self-pay | Admitting: Nurse Practitioner

## 2019-05-16 ENCOUNTER — Other Ambulatory Visit: Payer: Self-pay

## 2019-05-16 VITALS — BP 128/70 | HR 73 | Temp 98.2°F | Resp 18 | Ht 66.0 in | Wt 166.4 lb

## 2019-05-16 VITALS — BP 137/81 | HR 67 | Temp 98.0°F | Resp 18

## 2019-05-16 DIAGNOSIS — C786 Secondary malignant neoplasm of retroperitoneum and peritoneum: Secondary | ICD-10-CM

## 2019-05-16 DIAGNOSIS — Z79899 Other long term (current) drug therapy: Secondary | ICD-10-CM | POA: Insufficient documentation

## 2019-05-16 DIAGNOSIS — D500 Iron deficiency anemia secondary to blood loss (chronic): Secondary | ICD-10-CM

## 2019-05-16 DIAGNOSIS — R0602 Shortness of breath: Secondary | ICD-10-CM | POA: Diagnosis not present

## 2019-05-16 DIAGNOSIS — K921 Melena: Secondary | ICD-10-CM

## 2019-05-16 DIAGNOSIS — Z5111 Encounter for antineoplastic chemotherapy: Secondary | ICD-10-CM | POA: Insufficient documentation

## 2019-05-16 DIAGNOSIS — C182 Malignant neoplasm of ascending colon: Secondary | ICD-10-CM

## 2019-05-16 DIAGNOSIS — K5900 Constipation, unspecified: Secondary | ICD-10-CM | POA: Diagnosis not present

## 2019-05-16 DIAGNOSIS — R438 Other disturbances of smell and taste: Secondary | ICD-10-CM | POA: Insufficient documentation

## 2019-05-16 DIAGNOSIS — R0609 Other forms of dyspnea: Secondary | ICD-10-CM

## 2019-05-16 DIAGNOSIS — T8090XA Unspecified complication following infusion and therapeutic injection, initial encounter: Secondary | ICD-10-CM

## 2019-05-16 DIAGNOSIS — R630 Anorexia: Secondary | ICD-10-CM | POA: Diagnosis not present

## 2019-05-16 DIAGNOSIS — R067 Sneezing: Secondary | ICD-10-CM

## 2019-05-16 DIAGNOSIS — Z885 Allergy status to narcotic agent status: Secondary | ICD-10-CM | POA: Diagnosis not present

## 2019-05-16 DIAGNOSIS — K648 Other hemorrhoids: Secondary | ICD-10-CM

## 2019-05-16 DIAGNOSIS — T451X5A Adverse effect of antineoplastic and immunosuppressive drugs, initial encounter: Secondary | ICD-10-CM | POA: Diagnosis not present

## 2019-05-16 DIAGNOSIS — I2510 Atherosclerotic heart disease of native coronary artery without angina pectoris: Secondary | ICD-10-CM | POA: Insufficient documentation

## 2019-05-16 DIAGNOSIS — R972 Elevated prostate specific antigen [PSA]: Secondary | ICD-10-CM

## 2019-05-16 DIAGNOSIS — R188 Other ascites: Secondary | ICD-10-CM | POA: Diagnosis not present

## 2019-05-16 DIAGNOSIS — R110 Nausea: Secondary | ICD-10-CM

## 2019-05-16 DIAGNOSIS — R05 Cough: Secondary | ICD-10-CM | POA: Insufficient documentation

## 2019-05-16 DIAGNOSIS — R232 Flushing: Secondary | ICD-10-CM | POA: Insufficient documentation

## 2019-05-16 DIAGNOSIS — Z5112 Encounter for antineoplastic immunotherapy: Secondary | ICD-10-CM | POA: Diagnosis not present

## 2019-05-16 DIAGNOSIS — R590 Localized enlarged lymph nodes: Secondary | ICD-10-CM | POA: Insufficient documentation

## 2019-05-16 LAB — CBC WITH DIFFERENTIAL (CANCER CENTER ONLY)
Abs Immature Granulocytes: 0.01 10*3/uL (ref 0.00–0.07)
Basophils Absolute: 0 10*3/uL (ref 0.0–0.1)
Basophils Relative: 1 %
Eosinophils Absolute: 0.1 10*3/uL (ref 0.0–0.5)
Eosinophils Relative: 2 %
HCT: 34.8 % — ABNORMAL LOW (ref 39.0–52.0)
Hemoglobin: 10 g/dL — ABNORMAL LOW (ref 13.0–17.0)
Immature Granulocytes: 0 %
Lymphocytes Relative: 21 %
Lymphs Abs: 1 10*3/uL (ref 0.7–4.0)
MCH: 21.2 pg — ABNORMAL LOW (ref 26.0–34.0)
MCHC: 28.7 g/dL — ABNORMAL LOW (ref 30.0–36.0)
MCV: 73.7 fL — ABNORMAL LOW (ref 80.0–100.0)
Monocytes Absolute: 0.4 10*3/uL (ref 0.1–1.0)
Monocytes Relative: 8 %
Neutro Abs: 3.3 10*3/uL (ref 1.7–7.7)
Neutrophils Relative %: 68 %
Platelet Count: 218 10*3/uL (ref 150–400)
RBC: 4.72 MIL/uL (ref 4.22–5.81)
RDW: 27.4 % — ABNORMAL HIGH (ref 11.5–15.5)
WBC Count: 4.9 10*3/uL (ref 4.0–10.5)
nRBC: 0 % (ref 0.0–0.2)

## 2019-05-16 LAB — CMP (CANCER CENTER ONLY)
ALT: 9 U/L (ref 0–44)
AST: 15 U/L (ref 15–41)
Albumin: 3.6 g/dL (ref 3.5–5.0)
Alkaline Phosphatase: 83 U/L (ref 38–126)
Anion gap: 11 (ref 5–15)
BUN: 16 mg/dL (ref 8–23)
CO2: 24 mmol/L (ref 22–32)
Calcium: 8.9 mg/dL (ref 8.9–10.3)
Chloride: 104 mmol/L (ref 98–111)
Creatinine: 0.9 mg/dL (ref 0.61–1.24)
GFR, Est AFR Am: >60 mL/min (ref >60)
GFR, Estimated: >60 mL/min (ref >60)
Glucose, Bld: 102 mg/dL — ABNORMAL HIGH (ref 70–99)
Potassium: 4.2 mmol/L (ref 3.5–5.1)
Sodium: 139 mmol/L (ref 135–145)
Total Bilirubin: 0.6 mg/dL (ref 0.3–1.2)
Total Protein: 6.6 g/dL (ref 6.5–8.1)

## 2019-05-16 LAB — CEA (IN HOUSE-CHCC): CEA (CHCC-In House): 31.36 ng/mL — ABNORMAL HIGH (ref 0.00–5.00)

## 2019-05-16 LAB — FERRITIN: Ferritin: 144 ng/mL (ref 24–336)

## 2019-05-16 MED ORDER — SODIUM CHLORIDE 0.9 % IV SOLN
2400.0000 mg/m2 | INTRAVENOUS | Status: DC
  Administered 2019-05-16: 4550 mg via INTRAVENOUS
  Filled 2019-05-16: qty 91

## 2019-05-16 MED ORDER — SODIUM CHLORIDE 0.9% FLUSH
10.0000 mL | Freq: Once | INTRAVENOUS | Status: AC | PRN
  Administered 2019-05-16: 10 mL
  Filled 2019-05-16: qty 10

## 2019-05-16 MED ORDER — LEUCOVORIN CALCIUM INJECTION 350 MG
400.0000 mg/m2 | Freq: Once | INTRAVENOUS | Status: AC
  Administered 2019-05-16: 756 mg via INTRAVENOUS
  Filled 2019-05-16: qty 37.8

## 2019-05-16 MED ORDER — DEXAMETHASONE SODIUM PHOSPHATE 10 MG/ML IJ SOLN
INTRAMUSCULAR | Status: AC
  Filled 2019-05-16: qty 1

## 2019-05-16 MED ORDER — OXALIPLATIN CHEMO INJECTION 100 MG/20ML
80.0000 mg/m2 | Freq: Once | INTRAVENOUS | Status: AC
  Administered 2019-05-16: 150 mg via INTRAVENOUS
  Filled 2019-05-16: qty 10

## 2019-05-16 MED ORDER — DEXTROSE 5 % IV SOLN
Freq: Once | INTRAVENOUS | Status: AC
  Administered 2019-05-16: 11:00:00 via INTRAVENOUS
  Filled 2019-05-16: qty 250

## 2019-05-16 MED ORDER — METHYLPREDNISOLONE SODIUM SUCC 125 MG IJ SOLR
125.0000 mg | Freq: Once | INTRAMUSCULAR | Status: AC
  Administered 2019-05-16: 125 mg via INTRAVENOUS

## 2019-05-16 MED ORDER — ONDANSETRON HCL 4 MG/2ML IJ SOLN
8.0000 mg | Freq: Once | INTRAMUSCULAR | Status: AC
  Administered 2019-05-16: 8 mg via INTRAVENOUS

## 2019-05-16 MED ORDER — PALONOSETRON HCL INJECTION 0.25 MG/5ML
INTRAVENOUS | Status: AC
  Filled 2019-05-16: qty 5

## 2019-05-16 MED ORDER — SODIUM CHLORIDE 0.9 % IV SOLN
Freq: Once | INTRAVENOUS | Status: AC
  Administered 2019-05-16: 09:00:00 via INTRAVENOUS
  Filled 2019-05-16: qty 250

## 2019-05-16 MED ORDER — OXALIPLATIN CHEMO INJECTION 100 MG/20ML: 85.0000 mg/m2 | Freq: Once | INTRAVENOUS | Status: DC

## 2019-05-16 MED ORDER — PALONOSETRON HCL INJECTION 0.25 MG/5ML
0.2500 mg | Freq: Once | INTRAVENOUS | Status: AC
  Administered 2019-05-16: 0.25 mg via INTRAVENOUS

## 2019-05-16 MED ORDER — DEXAMETHASONE SODIUM PHOSPHATE 10 MG/ML IJ SOLN: 10.0000 mg | Freq: Once | INTRAMUSCULAR | Status: DC

## 2019-05-16 MED ORDER — SODIUM CHLORIDE 0.9 % IV SOLN
510.0000 mg | Freq: Once | INTRAVENOUS | Status: AC
  Administered 2019-05-16: 510 mg via INTRAVENOUS
  Filled 2019-05-16: qty 17

## 2019-05-16 MED ORDER — SODIUM CHLORIDE 0.9 % IV SOLN: 10.0000 mg | Freq: Once | INTRAVENOUS | Status: DC

## 2019-05-16 MED ORDER — DIPHENHYDRAMINE HCL 50 MG/ML IJ SOLN
50.0000 mg | Freq: Once | INTRAMUSCULAR | Status: AC
  Administered 2019-05-16: 50 mg via INTRAVENOUS

## 2019-05-16 MED ORDER — ONDANSETRON HCL 4 MG/2ML IJ SOLN
INTRAMUSCULAR | Status: AC
  Filled 2019-05-16: qty 4

## 2019-05-16 NOTE — Progress Notes (Signed)
    DATE:  05/16/2019                                          X INFED REACTION             MD:  Dr. Truitt Merle   AGENT/BLOOD PRODUCT RECEIVING TODAY:              INFED   AGENT/BLOOD PRODUCT RECEIVING IMMEDIATELY PRIOR TO REACTION:          INFED   VS: BP:      153/90   P:        74       SPO2:        100% on room air                  REACTION(S):            Nausea, erythema, abdominal pain, flushing, and cough   PREMEDS:      None   INTERVENTION: Solu-Medrol 125 mg IV x1 and Zofran 8 mg IV x1   Review of Systems  Review of Systems  Constitutional: Negative for chills, diaphoresis and fever.  HENT: Negative for trouble swallowing and voice change.   Respiratory: Positive for cough. Negative for chest tightness, shortness of breath and wheezing.   Cardiovascular: Negative for chest pain and palpitations.  Gastrointestinal: Positive for abdominal pain and nausea. Negative for constipation, diarrhea and vomiting.  Musculoskeletal: Negative for back pain and myalgias.  Skin:       Erythema and flushing  Neurological: Negative for dizziness, light-headedness and headaches.     Physical Exam  Physical Exam Constitutional:      General: He is not in acute distress.    Appearance: He is not diaphoretic.  HENT:     Head: Normocephalic and atraumatic.  Cardiovascular:     Rate and Rhythm: Normal rate and regular rhythm.     Heart sounds: Normal heart sounds. No murmur. No friction rub. No gallop.   Pulmonary:     Effort: Pulmonary effort is normal. No respiratory distress.     Breath sounds: Normal breath sounds. No wheezing or rales.  Abdominal:     General: Abdomen is flat. Bowel sounds are normal. There is no distension.     Palpations: Abdomen is soft.     Tenderness: There is no abdominal tenderness. There is no guarding.  Skin:    General: Skin is warm and dry.     Findings: Erythema present. No rash.     Comments: The patient's face was erythematous but began to  return to normal after he was dosed with Solu-Medrol.  Neurological:     Mental Status: He is alert.     OUTCOME:               The patient's symptoms abated after receiving Solu-Medrol and Pepcid.  The decision was made to not rechallenge the patient with INFeD today.     Sandi Mealy, MHS, PA-C  This case was discussed with Dr. Burr Medico. She expressed agreement with my management of this patient.

## 2019-05-16 NOTE — Progress Notes (Signed)
Nathaniel [facility]   [phone] [phone]   Clinic Follow up Note   Patient Care Team: Lajean Manes, MD as PCP - General (Internal Medicine) Berle Mull, MD as Consulting Physician (Family Medicine) Clarene Essex, MD as Consulting Physician (Gastroenterology) 05/16/2019  CHIEF COMPLAINT: f/u colon cancer   SUMMARY OF ONCOLOGIC HISTORY: Oncology History   Cancer Staging Cancer of right colon [facility]) Staging form: Colon and Rectum, AJCC 8th Edition - Clinical stage from 04/09/2019: Stage IVC (cTX, cNX, pM1c) - Signed by Truitt Merle, MD on 05/03/2019       Cancer of right colon [facility])   04/09/2019 Procedure    Colonoscopy 04/09/19 by Dr Watt Climes IMPRESSION -internal hemorrhoids -Diverticulosis in the sigmoid colon  -2 small polyps in the rectum and in the proximal transverse colon, removed with a hot snare. Resected and retrieved.  -3 medium polyps in the proximal transverse colon, in the mid transverse colon and in the distal transverse colon, removed and resected and retrieved.  -likely malignant partially obstructing tumor at the hepatic flexure. biopsied, tattooed.  -1 large polyp in the mid ascending colon  -the examination was otherwise normal      04/09/2019 Initial Biopsy    FINAL MICROSCOPIC DIAGNOSIS: 04/09/19 1. LG intestine-hepatic flexure, Biopsy:   INVASIVE WELL DIFFERENTIATED ADENOCARCINOMA    04/09/2019 Cancer Staging    Staging form: Colon and Rectum, AJCC 8th Edition - Clinical stage from 04/09/2019: Stage IVC (cTX, cNX, pM1c) - Signed by Truitt Merle, MD on 05/03/2019    04/10/2019 Imaging    CT AP 04/10/19  IMPRESSION: 1. There is an eccentric mass of the colon involving the ascending colon near the hepatic flexure measuring approximately 3.4 x 3.4 by 2.0 cm (series 2, image 37, series 3, image 37). There is extensive soft tissue nodularity of the mesocolon and omentum, and likely areas of the peritoneum, for example bilateral upper  quadrants (series 2, image 25). Findings are consistent with primary colon malignancy, probable omental and peritoneal involvement, and small volume malignant ascites. 2.  Other chronic and incidental findings as detailed above.    04/20/2019 Initial Diagnosis    Cancer of right colon ([name])    04/26/2019 Imaging    CT Chest 04/26/19 IMPRESSION: 1. Borderline to mild lower thoracic adenopathy, including within the right internal mammary and juxta cardiophrenic stations. Given the appearance of the upper abdomen, suspicious for nodal metastasis. 2. A low right paratracheal node is borderline sized, but favored to be reactive. 3. No evidence of pulmonary metastasis. 4. Peritoneal metastasis and abdominal ascites, as before. 5. Pancreatic parenchymal calcifications indicative of chronic calcific pancreatitis. 6. Coronary artery atherosclerosis.    04/27/2019 Pathology Results    Diagnosis 04/27/19 Peritoneum, biopsy, right upper quadrant, perihepatic - ADENOCARCINOMA, CONSISTENT WITH COLONIC PRIMARY. - SEE COMMENT.    05/16/2019 -  Chemotherapy    The patient had palonosetron (ALOXI) injection 0.25 mg, 0.25 mg, Intravenous,  Once, 1 of 4 cycles Administration: 0.25 mg (05/16/2019) bevacizumab (AVASTIN) 375 mg in sodium chloride 0.9 % 100 mL chemo infusion, 5 mg/kg = 375 mg, Intravenous,  Once, 0 of 3 cycles leucovorin 756 mg in dextrose 5 % 250 mL infusion, 400 mg/m2 = 756 mg, Intravenous,  Once, 1 of 4 cycles Administration: 756 mg (05/16/2019) oxaliplatin (ELOXATIN) 150 mg in dextrose 5 % 500 mL chemo infusion, 80 mg/m2 = 160 mg, Intravenous,  Once, 1 of 4 cycles Administration: 150 mg (05/16/2019) fluorouracil (ADRUCIL) 4,550 mg in sodium chloride 0.9 %  59 mL chemo infusion, 2,400 mg/m2 = 4,550 mg, Intravenous, 1 Day/Dose, 1 of 4 cycles Administration: 4,550 mg (05/16/2019)  for chemotherapy treatment.      CURRENT THERAPY: First line FOLFOX every 2 weeks with Avastin starting with cycle 2   INTERVAL HISTORY: Mr. Callaway returns for follow up and cycle 1 FOLFOX as scheduled. He completed IV Feraheme 2 weeks ago; he tolerated well. He is still fatigued; performs ADLs easily but more intense work like yard or car work is tiring. Appetite is normal. Stools are black from iron, but not bloody. Denies n/v/c/d. He has mild band-like abdominal pain that "moves around" and often associated with gas. Pain is mild in the daytime but worse at night when still, at most 3/10. He does not take or want pain medication. Has occasional dry cough and sneezing. Mild DOE due to fatigue. Denies chest pain, leg edema, fever or chills. He had virtual chemo education with Abigail Butts and [facility] placement recently. Port is sore and itchy.    MEDICAL HISTORY:  No past medical history on file.  SURGICAL HISTORY: Past Surgical History:  Procedure Laterality Date  . CATARACT EXTRACTION    . IR IMAGING GUIDED PORT INSERTION  05/10/2019  . L4 fracture  2012  . RETINAL DETACHMENT SURGERY    . Right hand surgery  1974    I have reviewed the social history and family history with the patient and they are unchanged from previous note.  ALLERGIES:  is allergic to codeine.  MEDICATIONS:  Current Outpatient Medications  Medication Sig Dispense Refill  . ferrous sulfate 325 (65 FE) MG tablet Take 325 mg by mouth daily with breakfast.    . lidocaine-prilocaine (EMLA) cream Apply to affected area once 30 g 3  . Multiple Vitamin (MULTIVITAMIN) tablet Take 1 tablet by mouth daily.    . ondansetron (ZOFRAN) 8 MG tablet Take 1 tablet (8 mg total) by mouth 2 (two) times daily as needed for refractory nausea / vomiting. Start on day 3 after chemotherapy. 30 tablet 1  . prochlorperazine (COMPAZINE) 10 MG tablet Take 1 tablet (10 mg total) by mouth every 6 (six) hours as needed (Nausea or vomiting). 30 tablet 1   No current facility-administered medications for this visit.    Facility-Administered Medications Ordered in  Other Visits  Medication Dose Route Frequency Provider Last Rate Last Dose  . dexamethasone (DECADRON) injection 10 mg  10 mg Intravenous Once Truitt Merle, MD      . fluorouracil (ADRUCIL) 4,550 mg in sodium chloride 0.9 % 59 mL chemo infusion  2,400 mg/m2 (Treatment Plan Recorded) Intravenous 1 day or 1 dose Truitt Merle, MD   4,550 mg at 05/16/19 1322    PHYSICAL EXAMINATION: ECOG PERFORMANCE STATUS: 1 - Symptomatic but completely ambulatory  Vitals:   05/16/19 0841  BP: 128/70  Pulse: 73  Resp: 18  Temp: 98.2 F (36.8 C)  SpO2: 98%   Filed Weights   05/16/19 0841  Weight: 166 lb 6.4 oz (75.5 kg)    GENERAL:alert, no distress and comfortable SKIN: no obvious rash  EYES: sclera clear LUNGS: respirations even and unlabored  HEART:  no lower extremity edema Musculoskeletal:no cyanosis of digits  NEURO: alert & oriented x 3 with fluent speech, normal gait PAC healing well  Limited exam for covid19 outbreak   LABORATORY DATA:  I have reviewed the data as listed CBC Latest Ref Rng & Units 05/16/2019 05/10/2019 04/27/2019  WBC 4.0 - 10.5 K/uL 4.9 5.8 6.5  Hemoglobin 13.0 - 17.0 g/dL 10.0(L) 10.2(L) 9.4(L)  Hematocrit 39.0 - 52.0 % 34.8(L) 35.8(L) 35.6(L)  Platelets 150 - 400 K/uL 218 229 314     CMP Latest Ref Rng & Units 05/16/2019 04/20/2019 12/24/2013  Glucose 70 - 99 mg/dL 102(H) 120(H) 96  BUN 8 - 23 mg/dL '16 10 22  ' Creatinine 0.61 - 1.24 mg/dL 0.90 1.04 0.87  Sodium 135 - 145 mmol/L 139 140 144  Potassium 3.5 - 5.1 mmol/L 4.2 4.2 4.5  Chloride 98 - 111 mmol/L 104 103 100  CO2 22 - 32 mmol/L '24 26 24  ' Calcium 8.9 - 10.3 mg/dL 8.9 8.9 9.6  Total Protein 6.5 - 8.1 g/dL 6.6 6.9 7.5  Total Bilirubin 0.3 - 1.2 mg/dL 0.6 0.7 0.9  Alkaline Phos 38 - 126 U/L 83 71 49  AST 15 - 41 U/L 15 13(L) 22  ALT 0 - 44 U/L '9 12 29      ' RADIOGRAPHIC STUDIES: I have personally reviewed the radiological images as listed and agreed with the findings in the report. No results found.    ASSESSMENT & PLAN: Dexter Signor is a 68 y.o. male with   1. Cancer of right colon,with peritoneal metastasis, stage IV, MMR normal -diagnosed 04/09/19 via colonoscopy. Staging workup showed nodularity of mesocolon and omentum and peritoneum. Chest CT showed indeterminate lymph nodes but otherwise negative for metastasis in the chest. Peritoneal biopsy from 04/27/19 confirmed adenocarcinoma, consistent with metastatic disease.  -Dr. Burr Medico recommended first line FOLFOX chemotherapy. Treatment goal is palliative -FO testing is pending; plan to add avastin with cycle 2 -today he appears well. He has intermittent abdominal pain exacerbated by gas. I recommend OTC gas-X PRN.  -Labs reviewed; CBC and CMP adequate to begin first cycle FOLFOX today, Q2 weeks. Baseline CEA is elevated to 31, will monitor  -we reviewed s/s he would need to call to report such as fever, chills, new cough, dyspnea, uncontrolled GI symptoms, or rash. He understands.  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  -GI workup with colonoscopy by Dr. Watt Climes showed internal hemorrhoids, benign polyps, diverticulosis and colon cancer.  -Started on OTC multivitamin and oral iron in late 03/2019 and has been having black stool, likely from oral iron.  -Baseline labs show Ferritin 13, iron WNL -received IV Feraheme 05/03/19 -ferritin improved significantly to 144 after 1 dose.  -he developed infusion reaction during 2nd dose IV Feraheme today with flushing, cough, nausea, stomach cramps, and difficulty breathing. He received IV benadryl, solu-medrol, and zofran. Symptoms resolved.  -If he needs additional IV iron infusion consider IV iron sucrose in the future   3. Elevated PSA  -PSA was 5.7 in 11/2018 and 4.6 in 01/2019 -per patient preference work up pending due to metastatic colon cancer   4. Social Support  -He is single with no children and no family that [place]. -He lives alone in 1 [address] [employer]. -He does not have Internet access at home  -He does have a cell phone which he can be reached. -If he needs help with transportation for treatment, our social workers can help him.   5. Goal of care discussion - full code  -treatment goal is palliative   PLAN: -Labs reviewed -Proceed with cycle 1 FOLFOX today -Plan to add avastin with cycle 2 -FO pending  -2nd dose IV Feraheme today -OTC  Gas-X for gas pain PRN  -F/u in 2 weeks with cycle 2  All questions were answered. The patient knows to call the clinic with any problems, questions or concerns. No barriers to learning was detected.     Alla Feeling, NP 05/16/19

## 2019-05-16 NOTE — Patient Instructions (Signed)
[facility] Discharge Instructions for Patients Receiving Chemotherapy  Today you received the following chemotherapy agents Oxaliplatin (ELOXATIN), Leucovorin & Flourouracil (ADRUCIL).  To help prevent nausea and vomiting after your treatment, we encourage you to take your nausea medication as prescribed.   If you develop nausea and vomiting that is not controlled by your nausea medication, call the clinic.   BELOW ARE SYMPTOMS THAT SHOULD BE REPORTED IMMEDIATELY:  *FEVER GREATER THAN 100.5 F  *CHILLS WITH OR WITHOUT FEVER  NAUSEA AND VOMITING THAT IS NOT CONTROLLED WITH YOUR NAUSEA MEDICATION  *UNUSUAL SHORTNESS OF BREATH  *UNUSUAL BRUISING OR BLEEDING  TENDERNESS IN MOUTH AND THROAT WITH OR WITHOUT PRESENCE OF ULCERS  *URINARY PROBLEMS  *BOWEL PROBLEMS  UNUSUAL RASH Items with * indicate a potential emergency and should be followed up as soon as possible.  Feel free to call the clinic should you have any questions or concerns. The clinic phone number is [phone].  Please show the [facility] at check-in to the Emergency Department and triage nurse.  Oxaliplatin Injection What is this medicine? OXALIPLATIN (ox AL i PLA tin) is a chemotherapy drug. It targets fast dividing cells, like cancer cells, and causes these cells to die. This medicine is used to treat cancers of the colon and rectum, and many other cancers. This medicine may be used for other purposes; ask your health care provider or pharmacist if you have questions. COMMON BRAND NAME(S): Eloxatin What should I tell my health care provider before I take this medicine? They need to know if you have any of these conditions: -kidney disease -an unusual or allergic reaction to oxaliplatin, other chemotherapy, other medicines, foods, dyes, or preservatives -pregnant or trying to get pregnant -breast-feeding How should I use this medicine? This drug is given as an infusion into a vein. It is  administered in a hospital or clinic by a specially trained health care professional. Talk to your pediatrician regarding the use of this medicine in children. Special care may be needed. Overdosage: If you think you have taken too much of this medicine contact a poison control center or emergency room at once. NOTE: This medicine is only for you. Do not share this medicine with others. What if I miss a dose? It is important not to miss a dose. Call your doctor or health care professional if you are unable to keep an appointment. What may interact with this medicine? -medicines to increase blood counts like filgrastim, pegfilgrastim, sargramostim -probenecid -some antibiotics like amikacin, gentamicin, neomycin, polymyxin B, streptomycin, tobramycin -zalcitabine Talk to your doctor or health care professional before taking any of these medicines: -acetaminophen -aspirin -ibuprofen -ketoprofen -naproxen This list may not describe all possible interactions. Give your health care provider a list of all the medicines, herbs, non-prescription drugs, or dietary supplements you use. Also tell them if you smoke, drink alcohol, or use illegal drugs. Some items may interact with your medicine. What should I watch for while using this medicine? Your condition will be monitored carefully while you are receiving this medicine. You will need important blood work done while you are taking this medicine. This medicine can make you more sensitive to cold. Do not drink cold drinks or use ice. Cover exposed skin before coming in contact with cold temperatures or cold objects. When out in cold weather wear warm clothing and cover your mouth and nose to warm the air that goes into your lungs. Tell your doctor if you get sensitive to  the cold. This drug may make you feel generally unwell. This is not uncommon, as chemotherapy can affect healthy cells as well as cancer cells. Report any side effects. Continue your  course of treatment even though you feel ill unless your doctor tells you to stop. In some cases, you may be given additional medicines to help with side effects. Follow all directions for their use. Call your doctor or health care professional for advice if you get a fever, chills or sore throat, or other symptoms of a cold or flu. Do not treat yourself. This drug decreases your body's ability to fight infections. Try to avoid being around people who are sick. This medicine may increase your risk to bruise or bleed. Call your doctor or health care professional if you notice any unusual bleeding. Be careful brushing and flossing your teeth or using a toothpick because you may get an infection or bleed more easily. If you have any dental work done, tell your dentist you are receiving this medicine. Avoid taking products that contain aspirin, acetaminophen, ibuprofen, naproxen, or ketoprofen unless instructed by your doctor. These medicines may hide a fever. Do not become pregnant while taking this medicine. Women should inform their doctor if they wish to become pregnant or think they might be pregnant. There is a potential for serious side effects to an unborn child. Talk to your health care professional or pharmacist for more information. Do not breast-feed an infant while taking this medicine. Call your doctor or health care professional if you get diarrhea. Do not treat yourself. What side effects may I notice from receiving this medicine? Side effects that you should report to your doctor or health care professional as soon as possible: -allergic reactions like skin rash, itching or hives, swelling of the face, lips, or tongue -low blood counts - This drug may decrease the number of white blood cells, red blood cells and platelets. You may be at increased risk for infections and bleeding. -signs of infection - fever or chills, cough, sore throat, pain or difficulty passing urine -signs of decreased  platelets or bleeding - bruising, pinpoint red spots on the skin, black, tarry stools, nosebleeds -signs of decreased red blood cells - unusually weak or tired, fainting spells, lightheadedness -breathing problems -chest pain, pressure -cough -diarrhea -jaw tightness -mouth sores -nausea and vomiting -pain, swelling, redness or irritation at the injection site -pain, tingling, numbness in the hands or feet -problems with balance, talking, walking -redness, blistering, peeling or loosening of the skin, including inside the mouth -trouble passing urine or change in the amount of urine Side effects that usually do not require medical attention (report to your doctor or health care professional if they continue or are bothersome): -changes in vision -constipation -hair loss -loss of appetite -metallic taste in the mouth or changes in taste -stomach pain This list may not describe all possible side effects. Call your doctor for medical advice about side effects. You may report side effects to FDA at 1-800-FDA-1088. Where should I keep my medicine? This drug is given in a hospital or clinic and will not be stored at home. NOTE: This sheet is a summary. It may not cover all possible information. If you have questions about this medicine, talk to your doctor, pharmacist, or health care provider.  2019 Elsevier/Gold Standard (2008-06-25 17:22:47)  Leucovorin injection What is this medicine? LEUCOVORIN (loo koe VOR in) is used to prevent or treat the harmful effects of some medicines. This medicine is used  to treat anemia caused by a low amount of folic acid in the body. It is also used with 5-fluorouracil (5-FU) to treat colon cancer. This medicine may be used for other purposes; ask your health care provider or pharmacist if you have questions. What should I tell my health care provider before I take this medicine? They need to know if you have any of these conditions: -anemia from low  levels of vitamin B-12 in the blood -an unusual or allergic reaction to leucovorin, folic acid, other medicines, foods, dyes, or preservatives -pregnant or trying to get pregnant -breast-feeding How should I use this medicine? This medicine is for injection into a muscle or into a vein. It is given by a health care professional in a hospital or clinic setting. Talk to your pediatrician regarding the use of this medicine in children. Special care may be needed. Overdosage: If you think you have taken too much of this medicine contact a poison control center or emergency room at once. NOTE: This medicine is only for you. Do not share this medicine with others. What if I miss a dose? This does not apply. What may interact with this medicine? -capecitabine -fluorouracil -phenobarbital -phenytoin -primidone -trimethoprim-sulfamethoxazole This list may not describe all possible interactions. Give your health care provider a list of all the medicines, herbs, non-prescription drugs, or dietary supplements you use. Also tell them if you smoke, drink alcohol, or use illegal drugs. Some items may interact with your medicine. What should I watch for while using this medicine? Your condition will be monitored carefully while you are receiving this medicine. This medicine may increase the side effects of 5-fluorouracil, 5-FU. Tell your doctor or health care professional if you have diarrhea or mouth sores that do not get better or that get worse. What side effects may I notice from receiving this medicine? Side effects that you should report to your doctor or health care professional as soon as possible: -allergic reactions like skin rash, itching or hives, swelling of the face, lips, or tongue -breathing problems -fever, infection -mouth sores -unusual bleeding or bruising -unusually weak or tired Side effects that usually do not require medical attention (report to your doctor or health care  professional if they continue or are bothersome): -constipation or diarrhea -loss of appetite -nausea, vomiting This list may not describe all possible side effects. Call your doctor for medical advice about side effects. You may report side effects to FDA at 1-800-FDA-1088. Where should I keep my medicine? This drug is given in a hospital or clinic and will not be stored at home. NOTE: This sheet is a summary. It may not cover all possible information. If you have questions about this medicine, talk to your doctor, pharmacist, or health care provider.  2019 Elsevier/Gold Standard (2008-06-04 16:50:29)  Fluorouracil, 5FU; Diclofenac topical cream What is this medicine? FLUOROURACIL; DICLOFENAC (flure oh YOOR a sil; dye KLOE fen ak) is a combination of a topical chemotherapy agent and non-steroidal anti-inflammatory drug (NSAID). It is used on the skin to treat skin cancer and skin conditions that could become cancer. This medicine may be used for other purposes; ask your health care provider or pharmacist if you have questions. COMMON BRAND NAME(S): FLUORAC What should I tell my health care provider before I take this medicine? They need to know if you have any of these conditions: -bleeding problems -cigarette smoker -DPD enzyme deficiency -heart disease -high blood pressure -if you frequently drink alcohol containing drinks -kidney disease -liver  disease -open or infected skin -stomach problems -swelling or open sores at the treatment site -recent or planned coronary artery bypass graft (CABG) surgery -an unusual or allergic reaction to fluorouracil, diclofenac, aspirin, other NSAIDs, other medicines, foods, dyes, or preservatives -pregnant or trying to get pregnant -breast-feeding How should I use this medicine? This medicine is only for use on the skin. Follow the directions on the prescription label. Wash hands before and after use. Wash affected area and gently pat dry. To  apply this medicine use a cotton-tipped applicator, or use gloves if applying with fingertips. If applied with unprotected fingertips, it is very important to wash your hands well after you apply this medicine. Avoid applying to the eyes, nose, or mouth. Apply enough medicine to cover the affected area. You can cover the area with a light gauze dressing, but do not use tight or air-tight dressings. Finish the full course prescribed by your doctor or health care professional, even if you think your condition is better. Do not stop taking except on the advice of your doctor or health care professional. Talk to your pediatrician regarding the use of this medicine in children. Special care may be needed. Overdosage: If you think you have taken too much of this medicine contact a poison control center or emergency room at once. NOTE: This medicine is only for you. Do not share this medicine with others. What if I miss a dose? If you miss a dose, apply it as soon as you can. If it is almost time for your next dose, only use that dose. Do not apply extra doses. Contact your doctor or health care professional if you miss more than one dose. What may interact with this medicine? Interactions are not expected. Do not use any other skin products without telling your doctor or health care professional. This list may not describe all possible interactions. Give your health care provider a list of all the medicines, herbs, non-prescription drugs, or dietary supplements you use. Also tell them if you smoke, drink alcohol, or use illegal drugs. Some items may interact with your medicine. What should I watch for while using this medicine? Visit your doctor or health care professional for checks on your progress. You will need to use this medicine for 2 to 6 weeks. This may be longer depending on the condition being treated. You may not see full healing for another 1 to 2 months after you stop using the medicine. Treated  areas of skin can look unsightly during and for several weeks after treatment with this medicine. This medicine can make you more sensitive to the sun. Keep out of the sun. If you cannot avoid being in the sun, wear protective clothing and use sunscreen. Do not use sun lamps or tanning beds/booths. If a pet comes in contact with the area where this medicine was applied to your skin or if it is ingested, they may have a serious risk of side effects. If accidental contact happens, the skin of the pet should be washed right away with soap and water. Contact your vet right away if your pet becomes exposed. Do not become pregnant while taking this medicine. Women should inform their doctor if they wish to become pregnant or think they might be pregnant. There is a potential for serious side effects to an unborn child. Talk to your health care professional or pharmacist for more information. What side effects may I notice from receiving this medicine? Side effects that you should report  to your doctor or health care professional as soon as possible: -allergic reactions like skin rash, itching or hives, swelling of the face, lips, or tongue -black or bloody stools, blood in the urine or vomit -blurred vision -chest pain -difficulty breathing or wheezing -redness, blistering, peeling or loosening of the skin, including inside the mouth -severe redness and swelling of normal skin -slurred speech or weakness on one side of the body -trouble passing urine or change in the amount of urine -unexplained weight gain or swelling -unusually weak or tired -yellowing of eyes or skin Side effects that usually do not require medical attention (report to your doctor or health care professional if they continue or are bothersome): -increased sensitivity of the skin to sun and ultraviolet light -pain and burning of the affected area -scaling or swelling of the affected area -skin rash, itching of the affected  area -tenderness This list may not describe all possible side effects. Call your doctor for medical advice about side effects. You may report side effects to FDA at 1-800-FDA-1088. Where should I keep my medicine? Keep out of the reach of children and pets. Store at room temperature between 20 and 25 degrees C (68 and 77 degrees F). Throw away any unused medicine after the expiration date. NOTE: This sheet is a summary. It may not cover all possible information. If you have questions about this medicine, talk to your doctor, pharmacist, or health care provider.  2019 Elsevier/Gold Standard (2016-01-09 17:35:08)  Ferumoxytol injection What is this medicine? FERUMOXYTOL is an iron complex. Iron is used to make healthy red blood cells, which carry oxygen and nutrients throughout the body. This medicine is used to treat iron deficiency anemia. This medicine may be used for other purposes; ask your health care provider or pharmacist if you have questions. COMMON BRAND NAME(S): Feraheme What should I tell my health care provider before I take this medicine? They need to know if you have any of these conditions: -anemia not caused by low iron levels -high levels of iron in the blood -magnetic resonance imaging (MRI) test scheduled -an unusual or allergic reaction to iron, other medicines, foods, dyes, or preservatives -pregnant or trying to get pregnant -breast-feeding How should I use this medicine? This medicine is for injection into a vein. It is given by a health care professional in a hospital or clinic setting. Talk to your pediatrician regarding the use of this medicine in children. Special care may be needed. Overdosage: If you think you have taken too much of this medicine contact a poison control center or emergency room at once. NOTE: This medicine is only for you. Do not share this medicine with others. What if I miss a dose? It is important not to miss your dose. Call your doctor  or health care professional if you are unable to keep an appointment. What may interact with this medicine? This medicine may interact with the following medications: -other iron products This list may not describe all possible interactions. Give your health care provider a list of all the medicines, herbs, non-prescription drugs, or dietary supplements you use. Also tell them if you smoke, drink alcohol, or use illegal drugs. Some items may interact with your medicine. What should I watch for while using this medicine? Visit your doctor or healthcare professional regularly. Tell your doctor or healthcare professional if your symptoms do not start to get better or if they get worse. You may need blood work done while you are taking this  medicine. You may need to follow a special diet. Talk to your doctor. Foods that contain iron include: whole grains/cereals, dried fruits, beans, or peas, leafy green vegetables, and organ meats (liver, kidney). What side effects may I notice from receiving this medicine? Side effects that you should report to your doctor or health care professional as soon as possible: -allergic reactions like skin rash, itching or hives, swelling of the face, lips, or tongue -breathing problems -changes in blood pressure -feeling faint or lightheaded, falls -fever or chills -flushing, sweating, or hot feelings -swelling of the ankles or feet Side effects that usually do not require medical attention (report to your doctor or health care professional if they continue or are bothersome): -diarrhea -headache -nausea, vomiting -stomach pain This list may not describe all possible side effects. Call your doctor for medical advice about side effects. You may report side effects to FDA at 1-800-FDA-1088. Where should I keep my medicine? This drug is given in a hospital or clinic and will not be stored at home. NOTE: This sheet is a summary. It may not cover all possible  information. If you have questions about this medicine, talk to your doctor, pharmacist, or health care provider.  2019 Elsevier/Gold Standard (2017-01-17 20:21:10)  Coronavirus (COVID-19) Are you at risk?  Are you at risk for the Coronavirus (COVID-19)?  To be considered HIGH RISK for Coronavirus (COVID-19), you have to meet the following criteria:  . Traveled to [place], [place], [place], [place] or [place]; or in the [place] to [place], [place], [place], or [place]; and have fever, cough, and shortness of breath within the last 2 weeks of travel OR . Been in close contact with a person diagnosed with COVID-19 within the last 2 weeks and have fever, cough, and shortness of breath . IF YOU DO NOT MEET THESE CRITERIA, YOU ARE CONSIDERED LOW RISK FOR COVID-19.  What to do if you are HIGH RISK for COVID-19?  Marland Kitchen If you are having a medical emergency, call 911. . Seek medical care right away. Before you go to a doctor's office, urgent care or emergency department, call ahead and tell them about your recent travel, contact with someone diagnosed with COVID-19, and your symptoms. You should receive instructions from your physician's office regarding next steps of care.  . When you arrive at healthcare provider, tell the healthcare staff immediately you have returned from visiting [place], [place], [place], [place] or [place]; or traveled in the [place] to [place], [place], [place], or [place]; in the last two weeks or you have been in close contact with a person diagnosed with COVID-19 in the last 2 weeks.   . Tell the health care staff about your symptoms: fever, cough and shortness of breath. . After you have been seen by a medical provider, you will be either: o Tested for (COVID-19) and discharged home on quarantine except to seek medical care if symptoms worsen, and asked to  - Stay home and avoid contact with others until you get your results (4-5 days)  - Avoid travel  on public transportation if possible (such as bus, train, or airplane) or o Sent to the Emergency Department by EMS for evaluation, COVID-19 testing, and possible admission depending on your condition and test results.  What to do if you are LOW RISK for COVID-19?  Reduce your risk of any infection by using the same precautions used for avoiding the common cold or flu:  Marland Kitchen Wash  your hands often with soap and warm water for at least 20 seconds.  If soap and water are not readily available, use an alcohol-based hand sanitizer with at least 60% alcohol.  . If coughing or sneezing, cover your mouth and nose by coughing or sneezing into the elbow areas of your shirt or coat, into a tissue or into your sleeve (not your hands). . Avoid shaking hands with others and consider head nods or verbal greetings only. . Avoid touching your eyes, nose, or mouth with unwashed hands.  . Avoid close contact with people who are sick. . Avoid places or events with large numbers of people in one location, like concerts or sporting events. . Carefully consider travel plans you have or are making. . If you are planning any travel outside or inside the [place], visit the CDC's Travelers' Health webpage for the latest health notices. . If you have some symptoms but not all symptoms, continue to monitor at home and seek medical attention if your symptoms worsen. . If you are having a medical emergency, call 911.   [place] / e-Visit: [email or web address]         [facility]: [facility]: [phone]                   [facility]: [phone]

## 2019-05-16 NOTE — Progress Notes (Signed)
Patient presented today for first time Folfox and second time Feraheme infusion. Feraheme was started at 0953, at 1000, patient started coughing, appeared flushed and red in the face, complained of nausea, stomach cramps and difficulty breathing. Feraheme infusion was stopped immediately, 2L oxygen administered via [place], NS 1 Ltr. bag started via gravity and [place], [place] [facility] notified. 50 MG IV benadryl, 125 MG IV solu-medrol and 8 MG IV Zofran were administered to patient and vitals obtained. At 1010, patient verbalized feeling better "almost normal". Per verbal order from [place], [place], "do not resume Feraheme." NS flush continued by gravity, 1030 vitals obtained stable, patient verbalized feeling normal again. Verbal order from [place] to continue with chemo treatment starting with pre-med Aloxi 0.25 MG IV but hold off on administering Decadron since patient already got Solu-Medrol, will administer Decadron if needed during chemo infusion. Oxaliplatin and Leucovorin infusion started at 2194, patient denies any complains or concerns at this time, will continue to monitor.

## 2019-05-16 NOTE — Progress Notes (Signed)
Patient tolerated his chemo infusion today. No complain or concerns voiced during and after the infusion. Education done with patient verbalizing understanding. AVS, schedule, spill kit and sharp container  provided at discharge. Patient knows to call clinic with any concerns.

## 2019-05-17 ENCOUNTER — Telehealth: Payer: Self-pay | Admitting: Nurse Practitioner

## 2019-05-17 NOTE — Telephone Encounter (Signed)
No los per 6/3. °

## 2019-05-18 ENCOUNTER — Other Ambulatory Visit: Payer: Self-pay

## 2019-05-18 ENCOUNTER — Inpatient Hospital Stay: Payer: Medicare Other

## 2019-05-18 ENCOUNTER — Telehealth: Payer: Self-pay | Admitting: *Deleted

## 2019-05-18 VITALS — BP 135/69 | HR 65 | Temp 98.7°F | Resp 18

## 2019-05-18 DIAGNOSIS — C786 Secondary malignant neoplasm of retroperitoneum and peritoneum: Secondary | ICD-10-CM | POA: Diagnosis not present

## 2019-05-18 DIAGNOSIS — D500 Iron deficiency anemia secondary to blood loss (chronic): Secondary | ICD-10-CM | POA: Diagnosis not present

## 2019-05-18 DIAGNOSIS — K648 Other hemorrhoids: Secondary | ICD-10-CM | POA: Diagnosis not present

## 2019-05-18 DIAGNOSIS — Z5111 Encounter for antineoplastic chemotherapy: Secondary | ICD-10-CM | POA: Diagnosis not present

## 2019-05-18 DIAGNOSIS — C182 Malignant neoplasm of ascending colon: Secondary | ICD-10-CM | POA: Diagnosis not present

## 2019-05-18 DIAGNOSIS — Z5112 Encounter for antineoplastic immunotherapy: Secondary | ICD-10-CM | POA: Diagnosis not present

## 2019-05-18 MED ORDER — SODIUM CHLORIDE 0.9% FLUSH
10.0000 mL | INTRAVENOUS | Status: DC | PRN
  Administered 2019-05-18: 10 mL
  Filled 2019-05-18: qty 10

## 2019-05-18 MED ORDER — HEPARIN SOD (PORK) LOCK FLUSH 100 UNIT/ML IV SOLN
500.0000 [IU] | Freq: Once | INTRAVENOUS | Status: AC | PRN
  Administered 2019-05-18: 500 [IU]
  Filled 2019-05-18: qty 5

## 2019-05-18 NOTE — Telephone Encounter (Signed)
Called pt to discuss how he did with his chemo.  He states chemo went well but he had reaction to iron.  He states bowels have not moved but doesn't feel constipated.  Discussed diet & use of stool softener/mild laxative if needed.  Has had some queasiness but didn't think enough to take something.  Encouraged to not let that get out of hand.  Will send schedulers message to fix pump d/c dates.  Encouraged to call with concerns/questions.

## 2019-05-18 NOTE — Telephone Encounter (Signed)
-----   Message from Georgianne Fick, RN sent at 05/16/2019  2:21 PM EDT ----- Regarding: Dr. Burr Medico first time Folfox Patient received first time Oxaliplatin, Leucovorin & went home with a 5FU pump today. He tolerated the infusion well.

## 2019-05-22 ENCOUNTER — Encounter: Payer: Self-pay | Admitting: Hematology

## 2019-05-22 NOTE — Progress Notes (Signed)
Request sent to [facility] for material from [employer] to be sent for testing, faxed to [phone], confirmation received

## 2019-05-25 NOTE — Progress Notes (Signed)
[place]   Telephone:([phone] [phone]   Clinic Follow up Note   Patient Care Team: Lajean Manes, MD as PCP - General (Internal Medicine) Berle Mull, MD as Consulting Physician (Family Medicine) Clarene Essex, MD as Consulting Physician (Gastroenterology)  Date of Service:  05/30/2019  CHIEF COMPLAINT: F/u of metastatic colon cancer  SUMMARY OF ONCOLOGIC HISTORY: Oncology History Overview Note  Cancer Staging Cancer of right colon [facility]) Staging form: Colon and Rectum, AJCC 8th Edition - Clinical stage from 04/09/2019: Stage IVC (cTX, cNX, pM1c) - Signed by Truitt Merle, MD on 05/03/2019     Cancer of right colon [facility])  04/09/2019 Procedure   Colonoscopy 04/09/19 by Dr Watt Climes IMPRESSION -internal hemorrhoids -Diverticulosis in the sigmoid colon  -2 small polyps in the rectum and in the proximal transverse colon, removed with a hot snare. Resected and retrieved.  -3 medium polyps in the proximal transverse colon, in the mid transverse colon and in the distal transverse colon, removed and resected and retrieved.  -likely malignant partially obstructing tumor at the hepatic flexure. biopsied, tattooed.  -1 large polyp in the mid ascending colon  -the examination was otherwise normal     04/09/2019 Initial Biopsy   FINAL MICROSCOPIC DIAGNOSIS: 04/09/19 1. LG intestine-hepatic flexure, Biopsy:   INVASIVE WELL DIFFERENTIATED ADENOCARCINOMA   04/09/2019 Cancer Staging   Staging form: Colon and Rectum, AJCC 8th Edition - Clinical stage from 04/09/2019: Stage IVC (cTX, cNX, pM1c) - Signed by Truitt Merle, MD on 05/03/2019   04/10/2019 Imaging   CT AP 04/10/19  IMPRESSION: 1. There is an eccentric mass of the colon involving the ascending colon near the hepatic flexure measuring approximately 3.4 x 3.4 by 2.0 cm (series 2, image 37, series 3, image 37). There is extensive soft tissue nodularity of the mesocolon and omentum, and likely areas of the  peritoneum, for example bilateral upper quadrants (series 2, image 25). Findings are consistent with primary colon malignancy, probable omental and peritoneal involvement, and small volume malignant ascites. 2.  Other chronic and incidental findings as detailed above.   04/20/2019 Initial Diagnosis   Cancer of right colon ([place])   04/26/2019 Imaging   CT Chest 04/26/19 IMPRESSION: 1. Borderline to mild lower thoracic adenopathy, including within the right internal mammary and juxta cardiophrenic stations. Given the appearance of the upper abdomen, suspicious for nodal metastasis. 2. A low right paratracheal node is borderline sized, but favored to be reactive. 3. No evidence of pulmonary metastasis. 4. Peritoneal metastasis and abdominal ascites, as before. 5. Pancreatic parenchymal calcifications indicative of chronic calcific pancreatitis. 6. Coronary artery atherosclerosis.   04/27/2019 Pathology Results   Diagnosis 04/27/19 Peritoneum, biopsy, right upper quadrant, perihepatic - ADENOCARCINOMA, CONSISTENT WITH COLONIC PRIMARY. - SEE COMMENT.   05/16/2019 -  Chemotherapy   First line FOLFOX every 2 weeks with Avastin starting with cycle 2 starting 05/16/19      CURRENT THERAPY:  First line FOLFOX every 2 weeks with Avastin starting with cycle 2 starting 05/16/19  INTERVAL HISTORY:  Nathaniel Norton is here for a follow up and treatment  He presents to the clinic alone. He notes first cycle treatment went well. He notes he had iron before infusion and with iron he had a reaction 5 minutes into it. He was having a funny cough and felt hot and turned red and sweating. His chemo infusion went well. He notes 2 days after infusion he had fatigue and his taste changed and was trying to take ensure  to compensate for low appetite. He feels he lost 5 pounds. He was able to gain his weight back by week 2. He denies cold sensitivity, nausea or change in bowel movement. He took prophylactic  antiemetics which lead to mild constipation so he stopped it.     REVIEW OF SYSTEMS:   Constitutional: Denies fevers, chills (+) taste change and lower appetite  Eyes: Denies blurriness of vision Ears, nose, mouth, throat, and face: Denies mucositis or sore throat Respiratory: Denies cough, dyspnea or wheezes Cardiovascular: Denies palpitation, chest discomfort or lower extremity swelling Gastrointestinal:  Denies nausea, heartburn or change in bowel habits Skin: Denies abnormal skin rashes Lymphatics: Denies new lymphadenopathy or easy bruising Neurological:Denies numbness, tingling or new weaknesses Behavioral/Psych: Mood is stable, no new changes  All other systems were reviewed with the patient and are negative.  MEDICAL HISTORY:  History reviewed. No pertinent past medical history.  SURGICAL HISTORY: Past Surgical History:  Procedure Laterality Date   CATARACT EXTRACTION     IR IMAGING GUIDED PORT INSERTION  05/10/2019   L4 fracture  2012   RETINAL DETACHMENT SURGERY     Right hand surgery  1974    I have reviewed the social history and family history with the patient and they are unchanged from previous note.  ALLERGIES:  is allergic to feraheme [ferumoxytol] and codeine.  MEDICATIONS:  Current Outpatient Medications  Medication Sig Dispense Refill   ferrous sulfate 325 (65 FE) MG tablet Take 325 mg by mouth daily with breakfast.     lidocaine-prilocaine (EMLA) cream Apply to affected area once 30 g 3   Multiple Vitamin (MULTIVITAMIN) tablet Take 1 tablet by mouth daily.     ondansetron (ZOFRAN) 8 MG tablet Take 1 tablet (8 mg total) by mouth 2 (two) times daily as needed for refractory nausea / vomiting. Start on day 3 after chemotherapy. 30 tablet 1   prochlorperazine (COMPAZINE) 10 MG tablet Take 1 tablet (10 mg total) by mouth every 6 (six) hours as needed (Nausea or vomiting). 30 tablet 1   No current facility-administered medications for this visit.      PHYSICAL EXAMINATION: ECOG PERFORMANCE STATUS: 1 - Symptomatic but completely ambulatory  Vitals:   05/30/19 0815  BP: 136/72  Pulse: 72  Resp: 18  Temp: (!) 97.1 F (36.2 C)  SpO2: 100%   Filed Weights   05/30/19 0815  Weight: 165 lb 6.4 oz (75 kg)    GENERAL:alert, no distress and comfortable SKIN: skin color, texture, turgor are normal, no rashes or significant lesions EYES: normal, Conjunctiva are pink and non-injected, sclera clear  NECK: supple, thyroid normal size, non-tender, without nodularity LYMPH:  no palpable lymphadenopathy in the cervical, axillary  LUNGS: clear to auscultation and percussion with normal breathing effort HEART: regular rate & rhythm and no murmurs and no lower extremity edema ABDOMEN:abdomen soft, non-tender and normal bowel sounds Musculoskeletal:no cyanosis of digits and no clubbing  NEURO: alert & oriented x 3 with fluent speech, no focal motor/sensory deficits  LABORATORY DATA:  I have reviewed the data as listed CBC Latest Ref Rng & Units 05/30/2019 05/16/2019 05/10/2019  WBC 4.0 - 10.5 K/uL 4.1 4.9 5.8  Hemoglobin 13.0 - 17.0 g/dL 10.7(L) 10.0(L) 10.2(L)  Hematocrit 39.0 - 52.0 % 36.8(L) 34.8(L) 35.8(L)  Platelets 150 - 400 K/uL 163 218 229     CMP Latest Ref Rng & Units 05/30/2019 05/16/2019 04/20/2019  Glucose 70 - 99 mg/dL 90 102(H) 120(H)  BUN 8 - 23 mg/dL  _0 Creatinine 0.61 - 1.24 mg/dL 0.88 0.90 1.04  Sodium 135 - 145 mmol/L 141 139 140  Potassium 3.5 - 5.1 mmol/L 4.2 4.2 4.2  Chloride 98 - 111 mmol/L 104 104 103  CO2 22 - 32 mmol/L _1 Calcium 8.9 - 10.3 mg/dL 9.0 8.9 8.9  Total Protein 6.5 - 8.1 g/dL 6.7 6.6 6.9  Total Bilirubin 0.3 - 1.2 mg/dL 0.5 0.6 0.7  Alkaline Phos 38 - 126 U/L 77 83 71  AST 15 - 41 U/L 17 15 13(L)  ALT 0 - 44 U/L _2 RADIOGRAPHIC STUDIES: I have personally reviewed the radiological images as listed and agreed with the findings in the report. No results found.    ASSESSMENT & PLAN:  Nathaniel Norton is a 68 y.o. male with   1. Cancer of right colon,with peritoneal metastasis, stage IV, MMR normal -He was recently diagnosed in 03/2019. His Colonoscopy biopsy showed adenocarcinoma of right hepatic flexurecolon. -His peritoneal biopsy from 04/27/19 shows adenocarcinoma, consistent with metastasis of his colon cancer. This is now stage IV disease which is no longer curable but still treatable.  -I will request [facility] for genomic testing to see if he is eligible for target therapy. His tumor has normal MMR, he is not a candidate for immunotherapy. FO still pending  -He has started first line FOLFOX q2weeks on 05/16/19.  -He tolerating first cycle well overall with mild fatigue and taste change which lead to weight loss. He was able to recover and gain weight back. I encouraged him to avoid cold substances during week 1 of treatment. Will add Avastin with cycle 2.  -Labs reviewed, CBC and CMP WNL except Hg 10.7. Overall adequate to proceed with FOLFOX and Avastin today.  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  -GI workup with coloscopy by Dr. Watt Climes showed internal hemorrhoids, benign polyps, diverticulosis and colon cancer.  -He was started on OTC multivitamin and oral iron in late 03/2019 and has been having black stool. This is likely from oral iron.  -He has been treated with 1 doses of IV Feraheme on 05/03/19 and 05/16/19. He did have reaction to 2nd infusion and infusion was stopped -He had moderate response. Hg at 10.7 today (05/30/19). If he needs more IV iron in the future, I may given another type of iron.   3. Elevated PSA  -PSA was 5.7 in 11/2018 and 4.6 in 01/2019 -He plans to wait on seeing Urologist for work up given colon cancer diagnosis. Thisis understandable.   4. Social Support -He is single with no children and no family that  [place]. -He lives alone in 1 level [employer]. -He does not have Internet access at home  -He does have a cell phone which he can be reached. -If he needs help with transportation for treatment, I discussed our social workers can help him.  -I encouraged him to find someone to help him as needed at home.   5.Goal of care discussion  -We again discussed the incurable nature of his cancer, and the overall poor prognosis, especially if he does not have good response to chemotherapy or progress on chemo -The patient understands the goal of care is palliative. -he is full code now   6. Taste change, lower appetite  -Secondary to chemo -He lost 5  pounds after first cycle chemo but was able to gain it back -He will continue Ensure as needed and I encouraged him to eat more of what he can keep down.  -Will monitor. I will set up dietician consult    PLAN: -Labs reviewed and adequate to proceed with FOLFOX and Avastin today  -Dietician consult  -Lab, flush, f/u and chemo FOLFOX and avastin every in 2, 4, 6, 8 weeks    No problem-specific Assessment & Plan notes found for this encounter.   No orders of the defined types were placed in this encounter.  All questions were answered. The patient knows to call the clinic with any problems, questions or concerns. No barriers to learning was detected. I spent 20 minutes counseling the patient face to face. The total time spent in the appointment was 25 minutes and more than 50% was on counseling and review of test results     Truitt Merle, MD 05/30/2019   I, Joslyn Devon, am acting as scribe for Truitt Merle, MD.   I have reviewed the above documentation for accuracy and completeness, and I agree with the above.

## 2019-05-29 ENCOUNTER — Encounter: Payer: Self-pay | Admitting: Hematology

## 2019-05-29 NOTE — Progress Notes (Signed)
Spoke with [place] from [facility] who advised they were waiting for billing information, they received that yesterday so now testing can proceed. Report will be sent to Dr. Burr Medico when it is complete. I updated Dr. Burr Medico and [place] on the status of testing. Original testing was faxed 6/8

## 2019-05-30 ENCOUNTER — Encounter: Payer: Self-pay | Admitting: Hematology

## 2019-05-30 ENCOUNTER — Inpatient Hospital Stay: Payer: Medicare Other

## 2019-05-30 ENCOUNTER — Other Ambulatory Visit: Payer: Self-pay

## 2019-05-30 ENCOUNTER — Ambulatory Visit: Payer: Medicare Other | Admitting: Nutrition

## 2019-05-30 ENCOUNTER — Inpatient Hospital Stay (HOSPITAL_BASED_OUTPATIENT_CLINIC_OR_DEPARTMENT_OTHER): Payer: Medicare Other | Admitting: Hematology

## 2019-05-30 VITALS — BP 136/72 | HR 72 | Temp 97.1°F | Resp 18 | Ht 66.0 in | Wt 165.4 lb

## 2019-05-30 VITALS — BP 125/76

## 2019-05-30 DIAGNOSIS — D500 Iron deficiency anemia secondary to blood loss (chronic): Secondary | ICD-10-CM | POA: Diagnosis not present

## 2019-05-30 DIAGNOSIS — C182 Malignant neoplasm of ascending colon: Secondary | ICD-10-CM | POA: Diagnosis not present

## 2019-05-30 DIAGNOSIS — R630 Anorexia: Secondary | ICD-10-CM

## 2019-05-30 DIAGNOSIS — R438 Other disturbances of smell and taste: Secondary | ICD-10-CM

## 2019-05-30 DIAGNOSIS — I2510 Atherosclerotic heart disease of native coronary artery without angina pectoris: Secondary | ICD-10-CM

## 2019-05-30 DIAGNOSIS — R110 Nausea: Secondary | ICD-10-CM

## 2019-05-30 DIAGNOSIS — R972 Elevated prostate specific antigen [PSA]: Secondary | ICD-10-CM | POA: Diagnosis not present

## 2019-05-30 DIAGNOSIS — T451X5A Adverse effect of antineoplastic and immunosuppressive drugs, initial encounter: Secondary | ICD-10-CM | POA: Diagnosis not present

## 2019-05-30 DIAGNOSIS — Z885 Allergy status to narcotic agent status: Secondary | ICD-10-CM

## 2019-05-30 DIAGNOSIS — R05 Cough: Secondary | ICD-10-CM

## 2019-05-30 DIAGNOSIS — C786 Secondary malignant neoplasm of retroperitoneum and peritoneum: Secondary | ICD-10-CM | POA: Diagnosis not present

## 2019-05-30 DIAGNOSIS — R0602 Shortness of breath: Secondary | ICD-10-CM

## 2019-05-30 DIAGNOSIS — R590 Localized enlarged lymph nodes: Secondary | ICD-10-CM | POA: Diagnosis not present

## 2019-05-30 DIAGNOSIS — R0609 Other forms of dyspnea: Secondary | ICD-10-CM

## 2019-05-30 DIAGNOSIS — K921 Melena: Secondary | ICD-10-CM

## 2019-05-30 DIAGNOSIS — R188 Other ascites: Secondary | ICD-10-CM | POA: Diagnosis not present

## 2019-05-30 DIAGNOSIS — Z79899 Other long term (current) drug therapy: Secondary | ICD-10-CM

## 2019-05-30 DIAGNOSIS — K648 Other hemorrhoids: Secondary | ICD-10-CM

## 2019-05-30 DIAGNOSIS — R067 Sneezing: Secondary | ICD-10-CM

## 2019-05-30 DIAGNOSIS — Z5112 Encounter for antineoplastic immunotherapy: Secondary | ICD-10-CM | POA: Diagnosis not present

## 2019-05-30 DIAGNOSIS — R232 Flushing: Secondary | ICD-10-CM

## 2019-05-30 DIAGNOSIS — Z5111 Encounter for antineoplastic chemotherapy: Secondary | ICD-10-CM | POA: Diagnosis not present

## 2019-05-30 LAB — CMP (CANCER CENTER ONLY)
ALT: 18 U/L (ref 0–44)
AST: 17 U/L (ref 15–41)
Albumin: 3.8 g/dL (ref 3.5–5.0)
Alkaline Phosphatase: 77 U/L (ref 38–126)
Anion gap: 10 (ref 5–15)
BUN: 10 mg/dL (ref 8–23)
CO2: 27 mmol/L (ref 22–32)
Calcium: 9 mg/dL (ref 8.9–10.3)
Chloride: 104 mmol/L (ref 98–111)
Creatinine: 0.88 mg/dL (ref 0.61–1.24)
GFR, Est AFR Am: >60 mL/min (ref >60)
GFR, Estimated: >60 mL/min (ref >60)
Glucose, Bld: 90 mg/dL (ref 70–99)
Potassium: 4.2 mmol/L (ref 3.5–5.1)
Sodium: 141 mmol/L (ref 135–145)
Total Bilirubin: 0.5 mg/dL (ref 0.3–1.2)
Total Protein: 6.7 g/dL (ref 6.5–8.1)

## 2019-05-30 LAB — CBC WITH DIFFERENTIAL (CANCER CENTER ONLY)
Abs Immature Granulocytes: 0.02 10*3/uL (ref 0.00–0.07)
Basophils Absolute: 0 10*3/uL (ref 0.0–0.1)
Basophils Relative: 1 %
Eosinophils Absolute: 0.1 10*3/uL (ref 0.0–0.5)
Eosinophils Relative: 2 %
HCT: 36.8 % — ABNORMAL LOW (ref 39.0–52.0)
Hemoglobin: 10.7 g/dL — ABNORMAL LOW (ref 13.0–17.0)
Immature Granulocytes: 1 %
Lymphocytes Relative: 27 %
Lymphs Abs: 1.1 10*3/uL (ref 0.7–4.0)
MCH: 21.7 pg — ABNORMAL LOW (ref 26.0–34.0)
MCHC: 29.1 g/dL — ABNORMAL LOW (ref 30.0–36.0)
MCV: 74.5 fL — ABNORMAL LOW (ref 80.0–100.0)
Monocytes Absolute: 0.4 10*3/uL (ref 0.1–1.0)
Monocytes Relative: 9 %
Neutro Abs: 2.5 10*3/uL (ref 1.7–7.7)
Neutrophils Relative %: 60 %
Platelet Count: 163 10*3/uL (ref 150–400)
RBC: 4.94 MIL/uL (ref 4.22–5.81)
RDW: 26.4 % — ABNORMAL HIGH (ref 11.5–15.5)
WBC Count: 4.1 10*3/uL (ref 4.0–10.5)
nRBC: 0 % (ref 0.0–0.2)

## 2019-05-30 LAB — TOTAL PROTEIN, URINE DIPSTICK: Protein, ur: NEGATIVE mg/dL

## 2019-05-30 MED ORDER — PALONOSETRON HCL INJECTION 0.25 MG/5ML
INTRAVENOUS | Status: AC
  Filled 2019-05-30: qty 5

## 2019-05-30 MED ORDER — DEXTROSE 5 % IV SOLN
Freq: Once | INTRAVENOUS | Status: AC
  Administered 2019-05-30: 11:00:00 via INTRAVENOUS
  Filled 2019-05-30: qty 250

## 2019-05-30 MED ORDER — LEUCOVORIN CALCIUM INJECTION 350 MG
400.0000 mg/m2 | Freq: Once | INTRAVENOUS | Status: AC
  Administered 2019-05-30: 756 mg via INTRAVENOUS
  Filled 2019-05-30: qty 37.8

## 2019-05-30 MED ORDER — SODIUM CHLORIDE 0.9% FLUSH
10.0000 mL | Freq: Once | INTRAVENOUS | Status: AC | PRN
  Administered 2019-05-30: 10 mL
  Filled 2019-05-30: qty 10

## 2019-05-30 MED ORDER — SODIUM CHLORIDE 0.9 % IV SOLN
2400.0000 mg/m2 | INTRAVENOUS | Status: DC
  Administered 2019-05-30: 4550 mg via INTRAVENOUS
  Filled 2019-05-30: qty 91

## 2019-05-30 MED ORDER — SODIUM CHLORIDE 0.9 % IV SOLN
5.2000 mg/kg | Freq: Once | INTRAVENOUS | Status: AC
  Administered 2019-05-30: 400 mg via INTRAVENOUS
  Filled 2019-05-30: qty 16

## 2019-05-30 MED ORDER — PALONOSETRON HCL INJECTION 0.25 MG/5ML
0.2500 mg | Freq: Once | INTRAVENOUS | Status: AC
  Administered 2019-05-30: 0.25 mg via INTRAVENOUS

## 2019-05-30 MED ORDER — SODIUM CHLORIDE 0.9 % IV SOLN
Freq: Once | INTRAVENOUS | Status: AC
  Administered 2019-05-30: 09:00:00 via INTRAVENOUS
  Filled 2019-05-30: qty 250

## 2019-05-30 MED ORDER — HEPARIN SOD (PORK) LOCK FLUSH 100 UNIT/ML IV SOLN
500.0000 [IU] | Freq: Once | INTRAVENOUS | Status: DC | PRN
  Filled 2019-05-30: qty 5

## 2019-05-30 MED ORDER — SODIUM CHLORIDE 0.9% FLUSH
10.0000 mL | INTRAVENOUS | Status: DC | PRN
  Filled 2019-05-30: qty 10

## 2019-05-30 MED ORDER — OXALIPLATIN CHEMO INJECTION 100 MG/20ML
80.0000 mg/m2 | Freq: Once | INTRAVENOUS | Status: AC
  Administered 2019-05-30: 150 mg via INTRAVENOUS
  Filled 2019-05-30: qty 20

## 2019-05-30 MED ORDER — DEXAMETHASONE SODIUM PHOSPHATE 10 MG/ML IJ SOLN
10.0000 mg | Freq: Once | INTRAMUSCULAR | Status: AC
  Administered 2019-05-30: 10 mg via INTRAVENOUS

## 2019-05-30 MED ORDER — DEXAMETHASONE SODIUM PHOSPHATE 10 MG/ML IJ SOLN
INTRAMUSCULAR | Status: AC
  Filled 2019-05-30: qty 1

## 2019-05-30 NOTE — Patient Instructions (Signed)
[facility] Discharge Instructions for Patients Receiving Chemotherapy  Today you received the following chemotherapy agents Oxaliplatin (ELOXATIN), Leucovorin & Flourouracil (ADRUCIL).  To help prevent nausea and vomiting after your treatment, we encourage you to take your nausea medication as prescribed.   If you develop nausea and vomiting that is not controlled by your nausea medication, call the clinic.   BELOW ARE SYMPTOMS THAT SHOULD BE REPORTED IMMEDIATELY:  *FEVER GREATER THAN 100.5 F  *CHILLS WITH OR WITHOUT FEVER  NAUSEA AND VOMITING THAT IS NOT CONTROLLED WITH YOUR NAUSEA MEDICATION  *UNUSUAL SHORTNESS OF BREATH  *UNUSUAL BRUISING OR BLEEDING  TENDERNESS IN MOUTH AND THROAT WITH OR WITHOUT PRESENCE OF ULCERS  *URINARY PROBLEMS  *BOWEL PROBLEMS  UNUSUAL RASH Items with * indicate a potential emergency and should be followed up as soon as possible.  Feel free to call the clinic should you have any questions or concerns. The clinic phone number is [phone].  Please show the [facility] at check-in to the Emergency Department and triage nurse.  Bevacizumab injection(Avastin) What is this medicine? BEVACIZUMAB (be va SIZ yoo mab) is a monoclonal antibody. It is used to treat many types of cancer. This medicine may be used for other purposes; ask your health care provider or pharmacist if you have questions. COMMON BRAND NAME(S): Avastin, MVASI What should I tell my health care provider before I take this medicine? They need to know if you have any of these conditions: -diabetes -heart disease -high blood pressure -history of coughing up blood -prior anthracycline chemotherapy (e.g., doxorubicin, daunorubicin, epirubicin) -recent or ongoing radiation therapy -recent or planning to have surgery -stroke -an unusual or allergic reaction to bevacizumab, hamster proteins, mouse proteins, other medicines, foods, dyes, or  preservatives -pregnant or trying to get pregnant -breast-feeding How should I use this medicine? This medicine is for infusion into a vein. It is given by a health care professional in a hospital or clinic setting. Talk to your pediatrician regarding the use of this medicine in children. Special care may be needed. Overdosage: If you think you have taken too much of this medicine contact a poison control center or emergency room at once. NOTE: This medicine is only for you. Do not share this medicine with others. What if I miss a dose? It is important not to miss your dose. Call your doctor or health care professional if you are unable to keep an appointment. What may interact with this medicine? Interactions are not expected. This list may not describe all possible interactions. Give your health care provider a list of all the medicines, herbs, non-prescription drugs, or dietary supplements you use. Also tell them if you smoke, drink alcohol, or use illegal drugs. Some items may interact with your medicine. What should I watch for while using this medicine? Your condition will be monitored carefully while you are receiving this medicine. You will need important blood work and urine testing done while you are taking this medicine. This medicine may increase your risk to bruise or bleed. Call your doctor or health care professional if you notice any unusual bleeding. This medicine should be started at least 28 days following major surgery and the site of the surgery should be totally healed. Check with your doctor before scheduling dental work or surgery while you are receiving this treatment. Talk to your doctor if you have recently had surgery or if you have a wound that has not healed. Do not become pregnant while taking  this medicine or for 6 months after stopping it. Women should inform their doctor if they wish to become pregnant or think they might be pregnant. There is a potential for  serious side effects to an unborn child. Talk to your health care professional or pharmacist for more information. Do not breast-feed an infant while taking this medicine and for 6 months after the last dose. This medicine has caused ovarian failure in some women. This medicine may interfere with the ability to have a child. You should talk to your doctor or health care professional if you are concerned about your fertility. What side effects may I notice from receiving this medicine? Side effects that you should report to your doctor or health care professional as soon as possible: -allergic reactions like skin rash, itching or hives, swelling of the face, lips, or tongue -chest pain or chest tightness -chills -coughing up blood -high fever -seizures -severe constipation -signs and symptoms of bleeding such as bloody or black, tarry stools; red or dark-brown urine; spitting up blood or brown material that looks like coffee grounds; red spots on the skin; unusual bruising or bleeding from the eye, gums, or nose -signs and symptoms of a blood clot such as breathing problems; chest pain; severe, sudden headache; pain, swelling, warmth in the leg -signs and symptoms of a stroke like changes in vision; confusion; trouble speaking or understanding; severe headaches; sudden numbness or weakness of the face, arm or leg; trouble walking; dizziness; loss of balance or coordination -stomach pain -sweating -swelling of legs or ankles -vomiting -weight gain Side effects that usually do not require medical attention (report to your doctor or health care professional if they continue or are bothersome): -back pain -changes in taste -decreased appetite -dry skin -nausea -tiredness This list may not describe all possible side effects. Call your doctor for medical advice about side effects. You may report side effects to FDA at 1-800-FDA-1088. Where should I keep my medicine? This drug is given in a  hospital or clinic and will not be stored at home. NOTE: This sheet is a summary. It may not cover all possible information. If you have questions about this medicine, talk to your doctor, pharmacist, or health care provider.  2019 Elsevier/Gold Standard (2016-11-26 14:33:29)

## 2019-05-30 NOTE — Progress Notes (Signed)
68 year old male diagnosed with Metastatic Colon cancer and is a patient for Dr. Burr Medico.  PMH not significant.  Medications include ferrous sulfate, lidocaine, MVI, Zofran and Compazine.  Labs were reviewed.  Height: 66 inches. Weight: 165.4 pounds UBW: 174 pounds. BMI: 26.7  Patient reports taste changes after chemotherapy.  States he lost about 10 pounds. Mainly complains about his inability to cook and doesn't want to waste food.  Nutrition Diagnosis: Food and Nutrition Related Knowledge Deficit related to metastatic cancer as evidenced by no prior need for nutrition information.  Intervention: Educated patient to increase small frequent meals and snacks utilizing high protein foods. Educated patient on strategies for easy to prepare meals and snacks. Educated on taste alteration strategies. Provided fact sheets on taste alterations and high protein foods.  Monitoring, Evaluation, Goals: Patient will tolerate adequate calories and protein to minimize weight loss.  Next Visit: Patient to contact me with questions or concerns.

## 2019-05-30 NOTE — Progress Notes (Signed)
Pt is receiving avastin for the first time today, no urine protein ordered.  Per [employer], she is putting in order.

## 2019-05-31 ENCOUNTER — Telehealth: Payer: Self-pay | Admitting: Hematology

## 2019-05-31 NOTE — Telephone Encounter (Signed)
Scheduled appts per 6/17 los.

## 2019-06-01 ENCOUNTER — Inpatient Hospital Stay: Payer: Medicare Other

## 2019-06-01 ENCOUNTER — Other Ambulatory Visit: Payer: Self-pay

## 2019-06-01 VITALS — BP 124/72 | HR 71 | Temp 98.1°F | Resp 17

## 2019-06-01 DIAGNOSIS — C786 Secondary malignant neoplasm of retroperitoneum and peritoneum: Secondary | ICD-10-CM | POA: Diagnosis not present

## 2019-06-01 DIAGNOSIS — D500 Iron deficiency anemia secondary to blood loss (chronic): Secondary | ICD-10-CM | POA: Diagnosis not present

## 2019-06-01 DIAGNOSIS — K648 Other hemorrhoids: Secondary | ICD-10-CM | POA: Diagnosis not present

## 2019-06-01 DIAGNOSIS — Z5111 Encounter for antineoplastic chemotherapy: Secondary | ICD-10-CM | POA: Diagnosis not present

## 2019-06-01 DIAGNOSIS — C182 Malignant neoplasm of ascending colon: Secondary | ICD-10-CM | POA: Diagnosis not present

## 2019-06-01 DIAGNOSIS — Z5112 Encounter for antineoplastic immunotherapy: Secondary | ICD-10-CM | POA: Diagnosis not present

## 2019-06-01 MED ORDER — SODIUM CHLORIDE 0.9% FLUSH
10.0000 mL | INTRAVENOUS | Status: DC | PRN
  Administered 2019-06-01: 10 mL
  Filled 2019-06-01: qty 10

## 2019-06-01 MED ORDER — HEPARIN SOD (PORK) LOCK FLUSH 100 UNIT/ML IV SOLN
500.0000 [IU] | Freq: Once | INTRAVENOUS | Status: AC | PRN
  Administered 2019-06-01: 500 [IU]
  Filled 2019-06-01: qty 5

## 2019-06-13 ENCOUNTER — Telehealth: Payer: Self-pay | Admitting: Nurse Practitioner

## 2019-06-13 ENCOUNTER — Encounter: Payer: Self-pay | Admitting: Nurse Practitioner

## 2019-06-13 ENCOUNTER — Inpatient Hospital Stay: Payer: Medicare Other

## 2019-06-13 ENCOUNTER — Inpatient Hospital Stay: Payer: Medicare Other | Attending: Hematology

## 2019-06-13 ENCOUNTER — Other Ambulatory Visit: Payer: Self-pay

## 2019-06-13 ENCOUNTER — Inpatient Hospital Stay (HOSPITAL_BASED_OUTPATIENT_CLINIC_OR_DEPARTMENT_OTHER): Payer: Medicare Other | Admitting: Nurse Practitioner

## 2019-06-13 VITALS — BP 114/78 | HR 60 | Temp 98.7°F | Resp 17 | Ht 66.0 in | Wt 163.9 lb

## 2019-06-13 DIAGNOSIS — K648 Other hemorrhoids: Secondary | ICD-10-CM | POA: Diagnosis not present

## 2019-06-13 DIAGNOSIS — Z885 Allergy status to narcotic agent status: Secondary | ICD-10-CM | POA: Insufficient documentation

## 2019-06-13 DIAGNOSIS — R634 Abnormal weight loss: Secondary | ICD-10-CM | POA: Insufficient documentation

## 2019-06-13 DIAGNOSIS — K921 Melena: Secondary | ICD-10-CM

## 2019-06-13 DIAGNOSIS — H00013 Hordeolum externum right eye, unspecified eyelid: Secondary | ICD-10-CM | POA: Diagnosis not present

## 2019-06-13 DIAGNOSIS — C786 Secondary malignant neoplasm of retroperitoneum and peritoneum: Secondary | ICD-10-CM | POA: Diagnosis not present

## 2019-06-13 DIAGNOSIS — I2510 Atherosclerotic heart disease of native coronary artery without angina pectoris: Secondary | ICD-10-CM | POA: Insufficient documentation

## 2019-06-13 DIAGNOSIS — Z452 Encounter for adjustment and management of vascular access device: Secondary | ICD-10-CM | POA: Diagnosis not present

## 2019-06-13 DIAGNOSIS — K5730 Diverticulosis of large intestine without perforation or abscess without bleeding: Secondary | ICD-10-CM | POA: Insufficient documentation

## 2019-06-13 DIAGNOSIS — D500 Iron deficiency anemia secondary to blood loss (chronic): Secondary | ICD-10-CM

## 2019-06-13 DIAGNOSIS — R188 Other ascites: Secondary | ICD-10-CM | POA: Diagnosis not present

## 2019-06-13 DIAGNOSIS — K5900 Constipation, unspecified: Secondary | ICD-10-CM | POA: Insufficient documentation

## 2019-06-13 DIAGNOSIS — R1110 Vomiting, unspecified: Secondary | ICD-10-CM | POA: Diagnosis not present

## 2019-06-13 DIAGNOSIS — R5383 Other fatigue: Secondary | ICD-10-CM | POA: Insufficient documentation

## 2019-06-13 DIAGNOSIS — C182 Malignant neoplasm of ascending colon: Secondary | ICD-10-CM | POA: Insufficient documentation

## 2019-06-13 DIAGNOSIS — R0602 Shortness of breath: Secondary | ICD-10-CM

## 2019-06-13 DIAGNOSIS — R590 Localized enlarged lymph nodes: Secondary | ICD-10-CM

## 2019-06-13 DIAGNOSIS — Z5111 Encounter for antineoplastic chemotherapy: Secondary | ICD-10-CM | POA: Insufficient documentation

## 2019-06-13 DIAGNOSIS — Z79899 Other long term (current) drug therapy: Secondary | ICD-10-CM

## 2019-06-13 DIAGNOSIS — R972 Elevated prostate specific antigen [PSA]: Secondary | ICD-10-CM

## 2019-06-13 DIAGNOSIS — Z95828 Presence of other vascular implants and grafts: Secondary | ICD-10-CM | POA: Insufficient documentation

## 2019-06-13 DIAGNOSIS — R6884 Jaw pain: Secondary | ICD-10-CM | POA: Diagnosis not present

## 2019-06-13 DIAGNOSIS — Z5112 Encounter for antineoplastic immunotherapy: Secondary | ICD-10-CM | POA: Insufficient documentation

## 2019-06-13 LAB — CMP (CANCER CENTER ONLY)
ALT: 18 U/L (ref 0–44)
AST: 19 U/L (ref 15–41)
Albumin: 4 g/dL (ref 3.5–5.0)
Alkaline Phosphatase: 71 U/L (ref 38–126)
Anion gap: 9 (ref 5–15)
BUN: 11 mg/dL (ref 8–23)
CO2: 27 mmol/L (ref 22–32)
Calcium: 9.1 mg/dL (ref 8.9–10.3)
Chloride: 104 mmol/L (ref 98–111)
Creatinine: 0.9 mg/dL (ref 0.61–1.24)
GFR, Est AFR Am: >60 mL/min (ref >60)
GFR, Estimated: >60 mL/min (ref >60)
Glucose, Bld: 80 mg/dL (ref 70–99)
Potassium: 4.3 mmol/L (ref 3.5–5.1)
Sodium: 140 mmol/L (ref 135–145)
Total Bilirubin: 0.7 mg/dL (ref 0.3–1.2)
Total Protein: 6.5 g/dL (ref 6.5–8.1)

## 2019-06-13 LAB — CBC WITH DIFFERENTIAL (CANCER CENTER ONLY)
Abs Immature Granulocytes: 0.01 10*3/uL (ref 0.00–0.07)
Basophils Absolute: 0 10*3/uL (ref 0.0–0.1)
Basophils Relative: 1 %
Eosinophils Absolute: 0 10*3/uL (ref 0.0–0.5)
Eosinophils Relative: 1 %
HCT: 37.8 % — ABNORMAL LOW (ref 39.0–52.0)
Hemoglobin: 11.6 g/dL — ABNORMAL LOW (ref 13.0–17.0)
Immature Granulocytes: 0 %
Lymphocytes Relative: 31 %
Lymphs Abs: 1.2 10*3/uL (ref 0.7–4.0)
MCH: 23 pg — ABNORMAL LOW (ref 26.0–34.0)
MCHC: 30.7 g/dL (ref 30.0–36.0)
MCV: 74.9 fL — ABNORMAL LOW (ref 80.0–100.0)
Monocytes Absolute: 0.4 10*3/uL (ref 0.1–1.0)
Monocytes Relative: 10 %
Neutro Abs: 2.1 10*3/uL (ref 1.7–7.7)
Neutrophils Relative %: 57 %
Platelet Count: 144 10*3/uL — ABNORMAL LOW (ref 150–400)
RBC: 5.05 MIL/uL (ref 4.22–5.81)
RDW: 25.1 % — ABNORMAL HIGH (ref 11.5–15.5)
WBC Count: 3.7 10*3/uL — ABNORMAL LOW (ref 4.0–10.5)
nRBC: 0 % (ref 0.0–0.2)

## 2019-06-13 LAB — FERRITIN: Ferritin: 91 ng/mL (ref 24–336)

## 2019-06-13 LAB — CEA (IN HOUSE-CHCC): CEA (CHCC-In House): 12.65 ng/mL — ABNORMAL HIGH (ref 0.00–5.00)

## 2019-06-13 MED ORDER — HEPARIN SOD (PORK) LOCK FLUSH 100 UNIT/ML IV SOLN
500.0000 [IU] | Freq: Once | INTRAVENOUS | Status: DC | PRN
  Filled 2019-06-13: qty 5

## 2019-06-13 MED ORDER — OXALIPLATIN CHEMO INJECTION 100 MG/20ML
80.0000 mg/m2 | Freq: Once | INTRAVENOUS | Status: AC
  Administered 2019-06-13: 150 mg via INTRAVENOUS
  Filled 2019-06-13: qty 20

## 2019-06-13 MED ORDER — DEXAMETHASONE SODIUM PHOSPHATE 10 MG/ML IJ SOLN
10.0000 mg | Freq: Once | INTRAMUSCULAR | Status: AC
  Administered 2019-06-13: 10 mg via INTRAVENOUS

## 2019-06-13 MED ORDER — SODIUM CHLORIDE 0.9 % IV SOLN
5.2000 mg/kg | Freq: Once | INTRAVENOUS | Status: AC
  Administered 2019-06-13: 400 mg via INTRAVENOUS
  Filled 2019-06-13: qty 16

## 2019-06-13 MED ORDER — SODIUM CHLORIDE 0.9% FLUSH
10.0000 mL | INTRAVENOUS | Status: DC | PRN
  Administered 2019-06-13: 10 mL
  Filled 2019-06-13: qty 10

## 2019-06-13 MED ORDER — DEXAMETHASONE SODIUM PHOSPHATE 10 MG/ML IJ SOLN
INTRAMUSCULAR | Status: AC
  Filled 2019-06-13: qty 1

## 2019-06-13 MED ORDER — PANTOPRAZOLE SODIUM 20 MG PO TBEC: 20.0000 mg | DELAYED_RELEASE_TABLET | Freq: Every day | ORAL | 0 refills | Status: DC

## 2019-06-13 MED ORDER — PALONOSETRON HCL INJECTION 0.25 MG/5ML
INTRAVENOUS | Status: AC
  Filled 2019-06-13: qty 5

## 2019-06-13 MED ORDER — SODIUM CHLORIDE 0.9 % IV SOLN
INTRAVENOUS | Status: DC
  Administered 2019-06-13: 11:00:00 via INTRAVENOUS
  Filled 2019-06-13: qty 250

## 2019-06-13 MED ORDER — DEXTROSE 5 % IV SOLN
Freq: Once | INTRAVENOUS | Status: AC
  Administered 2019-06-13: 12:00:00 via INTRAVENOUS
  Filled 2019-06-13: qty 250

## 2019-06-13 MED ORDER — PALONOSETRON HCL INJECTION 0.25 MG/5ML
0.2500 mg | Freq: Once | INTRAVENOUS | Status: AC
  Administered 2019-06-13: 0.25 mg via INTRAVENOUS

## 2019-06-13 MED ORDER — SODIUM CHLORIDE 0.9 % IV SOLN
2400.0000 mg/m2 | INTRAVENOUS | Status: DC
  Administered 2019-06-13: 4550 mg via INTRAVENOUS
  Filled 2019-06-13: qty 91

## 2019-06-13 MED ORDER — LEUCOVORIN CALCIUM INJECTION 350 MG
400.0000 mg/m2 | Freq: Once | INTRAVENOUS | Status: AC
  Administered 2019-06-13: 756 mg via INTRAVENOUS
  Filled 2019-06-13: qty 37.8

## 2019-06-13 MED ORDER — SODIUM CHLORIDE 0.9% FLUSH
10.0000 mL | INTRAVENOUS | Status: DC | PRN
  Filled 2019-06-13: qty 10

## 2019-06-13 NOTE — Progress Notes (Signed)
[place]   [phone] [phone]   Clinic Follow up Note   Patient Care Team: Lajean Manes, MD as PCP - General (Internal Medicine) Berle Mull, MD as Consulting Physician (Family Medicine) Clarene Essex, MD as Consulting Physician (Gastroenterology) 06/13/2019  CHIEF COMPLAINT: f/u metastatic colon cancer   SUMMARY OF ONCOLOGIC HISTORY: Oncology History Overview Note  Cancer Staging Cancer of right colon [facility]) Staging form: Colon and Rectum, AJCC 8th Edition - Clinical stage from 04/09/2019: Stage IVC (cTX, cNX, pM1c) - Signed by Truitt Merle, MD on 05/03/2019     Cancer of right colon [facility])  04/09/2019 Procedure   Colonoscopy 04/09/19 by Dr Watt Climes IMPRESSION -internal hemorrhoids -Diverticulosis in the sigmoid colon  -2 small polyps in the rectum and in the proximal transverse colon, removed with a hot snare. Resected and retrieved.  -3 medium polyps in the proximal transverse colon, in the mid transverse colon and in the distal transverse colon, removed and resected and retrieved.  -likely malignant partially obstructing tumor at the hepatic flexure. biopsied, tattooed.  -1 large polyp in the mid ascending colon  -the examination was otherwise normal     04/09/2019 Initial Biopsy   FINAL MICROSCOPIC DIAGNOSIS: 04/09/19 1. LG intestine-hepatic flexure, Biopsy:   INVASIVE WELL DIFFERENTIATED ADENOCARCINOMA   04/09/2019 Cancer Staging   Staging form: Colon and Rectum, AJCC 8th Edition - Clinical stage from 04/09/2019: Stage IVC (cTX, cNX, pM1c) - Signed by Truitt Merle, MD on 05/03/2019   04/10/2019 Imaging   CT AP 04/10/19  IMPRESSION: 1. There is an eccentric mass of the colon involving the ascending colon near the hepatic flexure measuring approximately 3.4 x 3.4 by 2.0 cm (series 2, image 37, series 3, image 37). There is extensive soft tissue nodularity of the mesocolon and omentum, and likely areas of the peritoneum, for example bilateral  upper quadrants (series 2, image 25). Findings are consistent with primary colon malignancy, probable omental and peritoneal involvement, and small volume malignant ascites. 2.  Other chronic and incidental findings as detailed above.   04/20/2019 Initial Diagnosis   Cancer of right colon ([place])   04/26/2019 Imaging   CT Chest 04/26/19 IMPRESSION: 1. Borderline to mild lower thoracic adenopathy, including within the right internal mammary and juxta cardiophrenic stations. Given the appearance of the upper abdomen, suspicious for nodal metastasis. 2. A low right paratracheal node is borderline sized, but favored to be reactive. 3. No evidence of pulmonary metastasis. 4. Peritoneal metastasis and abdominal ascites, as before. 5. Pancreatic parenchymal calcifications indicative of chronic calcific pancreatitis. 6. Coronary artery atherosclerosis.   04/27/2019 Pathology Results   Diagnosis 04/27/19 Peritoneum, biopsy, right upper quadrant, perihepatic - ADENOCARCINOMA, CONSISTENT WITH COLONIC PRIMARY. - SEE COMMENT.   05/16/2019 -  Chemotherapy   First line FOLFOX every 2 weeks with Avastin starting with cycle 2 starting 05/16/19     CURRENT THERAPY: First lineFOLFOX every 2 weeks with Avastin starting with cycle 2 starting 05/16/19  INTERVAL HISTORY: Mr. Matheson returns for f/u and treatment as scheduled. He completed cycle 2 on 05/30/19. He experienced more oral cold sensitivity for 4-5 days. Denies other neuropathy. He had irregular BM with moderate constipation few days after chemo. He took some dulcolax which helped. He notes black tarry stools while constipated. This week his bowels were more lose, but back to normal color and consistency today. He takes iron tabs, stool shave been intermittently black before. Denies overt rectal bleeding. Denies nausea or hematemesis. His abdominal pain is improved.  Taste is decreased. Denies mucositis. Fatigue level fluctuates. Denies fever, chills,  cough, chest pain, leg edema. He is mildly short of breath on exertion when working outside in heat/humidity.    MEDICAL HISTORY:  No past medical history on file.  SURGICAL HISTORY: Past Surgical History:  Procedure Laterality Date   CATARACT EXTRACTION     IR IMAGING GUIDED PORT INSERTION  05/10/2019   L4 fracture  2012   RETINAL DETACHMENT SURGERY     Right hand surgery  1974    I have reviewed the social history and family history with the patient and they are unchanged from previous note.  ALLERGIES:  is allergic to feraheme [ferumoxytol] and codeine.  MEDICATIONS:  Current Outpatient Medications  Medication Sig Dispense Refill   ferrous sulfate 325 (65 FE) MG tablet Take 325 mg by mouth daily with breakfast.     lidocaine-prilocaine (EMLA) cream Apply to affected area once 30 g 3   Multiple Vitamin (MULTIVITAMIN) tablet Take 1 tablet by mouth daily.     ondansetron (ZOFRAN) 8 MG tablet Take 1 tablet (8 mg total) by mouth 2 (two) times daily as needed for refractory nausea / vomiting. Start on day 3 after chemotherapy. 30 tablet 1   prochlorperazine (COMPAZINE) 10 MG tablet Take 1 tablet (10 mg total) by mouth every 6 (six) hours as needed (Nausea or vomiting). 30 tablet 1   pantoprazole (PROTONIX) 20 MG tablet Take 1 tablet (20 mg total) by mouth daily. 30 tablet 0   No current facility-administered medications for this visit.    Facility-Administered Medications Ordered in Other Visits  Medication Dose Route Frequency Provider Last Rate Last Dose   0.9 %  sodium chloride infusion   Intravenous Continuous Truitt Merle, MD   Stopped at 06/13/19 1150   fluorouracil (ADRUCIL) 4,550 mg in sodium chloride 0.9 % 59 mL chemo infusion  2,400 mg/m2 (Treatment Plan Recorded) Intravenous 1 day or 1 dose Truitt Merle, MD       heparin lock flush 100 unit/mL  500 Units Intracatheter Once PRN Truitt Merle, MD       leucovorin 756 mg in dextrose 5 % 250 mL infusion  400 mg/m2  (Treatment Plan Recorded) Intravenous Once Truitt Merle, MD 144 mL/hr at 06/13/19 1156 756 mg at 06/13/19 1156   oxaliplatin (ELOXATIN) 150 mg in dextrose 5 % 500 mL chemo infusion  80 mg/m2 (Treatment Plan Recorded) Intravenous Once Truitt Merle, MD 265 mL/hr at 06/13/19 1159 150 mg at 06/13/19 1159   sodium chloride flush (NS) 0.9 % injection 10 mL  10 mL Intracatheter PRN Truitt Merle, MD        PHYSICAL EXAMINATION: ECOG PERFORMANCE STATUS: 1 - Symptomatic but completely ambulatory  Vitals:   06/13/19 0918  BP: 114/78  Pulse: 60  Resp: 17  Temp: 98.7 F (37.1 C)  SpO2: 99%   Filed Weights   06/13/19 0918  Weight: 163 lb 14.4 oz (74.3 kg)    GENERAL:alert, no distress and comfortable SKIN: no rashes or significant lesions EYES: sclera clear LUNGS: respirations even and unlabored  HEART:  no lower extremity edema ABDOMEN:abdomen flat Musculoskeletal:no cyanosis of digits  NEURO: alert & oriented x 3 with fluent speech PAC without erythema  Limited exam for covid19 outbreak   LABORATORY DATA:  I have reviewed the data as listed CBC Latest Ref Rng & Units 06/13/2019 05/30/2019 05/16/2019  WBC 4.0 - 10.5 K/uL 3.7(L) 4.1 4.9  Hemoglobin 13.0 - 17.0 g/dL 11.6(L) 10.7(L) 10.0(L)  Hematocrit 39.0 - 52.0 % 37.8(L) 36.8(L) 34.8(L)  Platelets 150 - 400 K/uL 144(L) 163 218     CMP Latest Ref Rng & Units 06/13/2019 05/30/2019 05/16/2019  Glucose 70 - 99 mg/dL 80 90 102(H)  BUN 8 - 23 mg/dL _0 Creatinine 0.61 - 1.24 mg/dL 0.90 0.88 0.90  Sodium 135 - 145 mmol/L 140 141 139  Potassium 3.5 - 5.1 mmol/L 4.3 4.2 4.2  Chloride 98 - 111 mmol/L 104 104 104  CO2 22 - 32 mmol/L _1 Calcium 8.9 - 10.3 mg/dL 9.1 9.0 8.9  Total Protein 6.5 - 8.1 g/dL 6.5 6.7 6.6  Total Bilirubin 0.3 - 1.2 mg/dL 0.7 0.5 0.6  Alkaline Phos 38 - 126 U/L 71 77 83  AST 15 - 41 U/L _2 ALT 0 - 44 U/L _3 RADIOGRAPHIC STUDIES: I have personally reviewed the radiological images as listed and  agreed with the findings in the report. No results found.   ASSESSMENT & PLAN: Adriaan Maltese is a 68 y.o. male with   1. Cancer of right colon,with peritoneal metastasis,stage IV,MMR normal -He was recently diagnosed in 03/2019. His Colonoscopy biopsy showed adenocarcinoma of right hepatic flexurecolon.Peritoneal biopsy from 04/27/19 showsadenocarcinoma,consistent withmetastasis of his colon cancer, stage IV disease   -tumor is MMR normal, he is not a candidate forimmunotherapy. FO still pending to see if he is a candidate for targeted therapy -S/p cycle 2 FOLFOX on 6/17, avastin added with cycle 2 -He tolerated first cycle well overall with mild fatigue and taste change which lead to weight loss. He was able to recover and gain weight back. He had more difficulty after cycle 2 with what he describes as severe constipation with black tarry stools and moderate oral cold sensitivity. He has recovered today and appears well, BM at baseline. We reviewed bowel regimen.  -Clinically his abdominal pain has improved since starting chemotherapy  -Labs reviewed, CBC and CMP adequate for treatment. Ferritin normal. CEA trending down, in combination with improved abdominal pain likely indicates response to therapy. -proceed with cycle 3 FOLFOX and avastin today -F/u in 2 weeks with cycle 4  I encouraged him to avoid cold substances during week 1 of treatment. Will add Avastin with cycle 2.  -Labs reviewed, CBC and CMP WNL except Hg 10.7. Overall adequate to proceed with FOLFOX and Avastin today.  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  -GI workup with coloscopy by Dr. Watt Climes showed internal hemorrhoids, benign polyps, diverticulosis and colon cancer.  -He was started on OTC multivitamin and oral iron in late 03/2019 and has been having intermittent black stool likely related to iron supplement   -s/p 1 doses of IV Feraheme on 05/03/19 and 05/16/19. He did have reaction to 2nd infusion and infusion was stopped.  -He had moderate response. Ferritin 91, Hg 11.6 today, improved; likely no active bleeding -he reports intermittent black tarry stools since last chemo when he was very constipated, BM has returned to normal consistency and color.  -we reviewed bowel regimen with consistent use of miralax and adequate hydration -due to concern for GI bleed vs ulcer I started PPI with pantoprazole. He knows to call asap if he has recurrent symptoms, I discussed with Dr. Burr Medico   3. Elevated PSA  -PSA was 5.7 in 11/2018 and 4.6 in 01/2019 -urology  f/u deferred due to metastatic colon cancer   4. Social Support -limited social support  5.Goal of care discussion -full code -he understands the treatment goal is palliative; will continue as long as he can tolerate and it controls his disease   6. Taste change, lower appetite  -Secondary to chemo -followed by dietician -weight stable   PLAN: -Labs reviewed -Proceed with cycle 3 FOLFOX and avastin -FO pending -Begin PPI -Discussed bowel regimen, will try 1/2 - 1 dose miralax daily, BID if constipation is severe -F/u in 2 weeks with cycle 4  All questions were answered. The patient knows to call the clinic with any problems, questions or concerns. No barriers to learning was detected. I spent 20 minutes counseling the patient face to face. The total time spent in the appointment was 25 minutes and more than 50% was on counseling and review of test results     Alla Feeling, NP 06/13/19

## 2019-06-13 NOTE — Patient Instructions (Signed)
[facility] Discharge Instructions for Patients Receiving Chemotherapy  Today you received the following chemotherapy agents: Avastin, leucovorin, Oxaliplatin, 5FU pump  To help prevent nausea and vomiting after your treatment, we encourage you to take your nausea medication as directed.    If you develop nausea and vomiting that is not controlled by your nausea medication, call the clinic.   BELOW ARE SYMPTOMS THAT SHOULD BE REPORTED IMMEDIATELY:  *FEVER GREATER THAN 100.5 F  *CHILLS WITH OR WITHOUT FEVER  NAUSEA AND VOMITING THAT IS NOT CONTROLLED WITH YOUR NAUSEA MEDICATION  *UNUSUAL SHORTNESS OF BREATH  *UNUSUAL BRUISING OR BLEEDING  TENDERNESS IN MOUTH AND THROAT WITH OR WITHOUT PRESENCE OF ULCERS  *URINARY PROBLEMS  *BOWEL PROBLEMS  UNUSUAL RASH Items with * indicate a potential emergency and should be followed up as soon as possible.  Feel free to call the clinic should you have any questions or concerns. The clinic phone number is [phone].  Please show the [place] at check-in to the Emergency Department and triage nurse.  Coronavirus (COVID-19) Are you at risk?  Are you at risk for the Coronavirus (COVID-19)?  To be considered HIGH RISK for Coronavirus (COVID-19), you have to meet the following criteria:  . Traveled to [place], [place], [place], [place] or [place]; or in the [place] to [place], [place], [place], or [place]; and have fever, cough, and shortness of breath within the last 2 weeks of travel OR . Been in close contact with a person diagnosed with COVID-19 within the last 2 weeks and have fever, cough, and shortness of breath . IF YOU DO NOT MEET THESE CRITERIA, YOU ARE CONSIDERED LOW RISK FOR COVID-19.  What to do if you are HIGH RISK for COVID-19?  Marland Kitchen If you are having a medical emergency, call 911. . Seek medical care right away. Before you go to a doctor's office, urgent care or emergency department, call  ahead and tell them about your recent travel, contact with someone diagnosed with COVID-19, and your symptoms. You should receive instructions from your physician's office regarding next steps of care.  . When you arrive at healthcare provider, tell the healthcare staff immediately you have returned from visiting [place], [place], [place], [place] or [place]; or traveled in the [place] to [place], [place], [place], or [place]; in the last two weeks or you have been in close contact with a person diagnosed with COVID-19 in the last 2 weeks.   . Tell the health care staff about your symptoms: fever, cough and shortness of breath. . After you have been seen by a medical provider, you will be either: o Tested for (COVID-19) and discharged home on quarantine except to seek medical care if symptoms worsen, and asked to  - Stay home and avoid contact with others until you get your results (4-5 days)  - Avoid travel on public transportation if possible (such as bus, train, or airplane) or o Sent to the Emergency Department by EMS for evaluation, COVID-19 testing, and possible admission depending on your condition and test results.  What to do if you are LOW RISK for COVID-19?  Reduce your risk of any infection by using the same precautions used for avoiding the common cold or flu:  Marland Kitchen Wash your hands often with soap and warm water for at least 20 seconds.  If soap and water are not readily available, use an alcohol-based hand sanitizer with at least 60% alcohol.  Marland Kitchen  If coughing or sneezing, cover your mouth and nose by coughing or sneezing into the elbow areas of your shirt or coat, into a tissue or into your sleeve (not your hands). . Avoid shaking hands with others and consider head nods or verbal greetings only. . Avoid touching your eyes, nose, or mouth with unwashed hands.  . Avoid close contact with people who are sick. . Avoid places or events with large numbers of people in one location,  like concerts or sporting events. . Carefully consider travel plans you have or are making. . If you are planning any travel outside or inside the [place], visit the CDC's Travelers' Health webpage for the latest health notices. . If you have some symptoms but not all symptoms, continue to monitor at home and seek medical attention if your symptoms worsen. . If you are having a medical emergency, call 911.   [facility] / e-Visit: [email or web address]         [facility]: [facility]: [phone]                   [facility]: [phone]

## 2019-06-13 NOTE — Telephone Encounter (Signed)
No los per 7/1. °

## 2019-06-15 ENCOUNTER — Other Ambulatory Visit: Payer: Self-pay

## 2019-06-15 ENCOUNTER — Inpatient Hospital Stay: Payer: Medicare Other

## 2019-06-15 VITALS — BP 148/71 | HR 76 | Temp 98.7°F | Resp 17

## 2019-06-15 DIAGNOSIS — C182 Malignant neoplasm of ascending colon: Secondary | ICD-10-CM | POA: Diagnosis not present

## 2019-06-15 DIAGNOSIS — Z452 Encounter for adjustment and management of vascular access device: Secondary | ICD-10-CM | POA: Diagnosis not present

## 2019-06-15 DIAGNOSIS — Z5112 Encounter for antineoplastic immunotherapy: Secondary | ICD-10-CM | POA: Diagnosis not present

## 2019-06-15 DIAGNOSIS — K648 Other hemorrhoids: Secondary | ICD-10-CM | POA: Diagnosis not present

## 2019-06-15 DIAGNOSIS — D500 Iron deficiency anemia secondary to blood loss (chronic): Secondary | ICD-10-CM

## 2019-06-15 DIAGNOSIS — Z5111 Encounter for antineoplastic chemotherapy: Secondary | ICD-10-CM | POA: Diagnosis not present

## 2019-06-15 DIAGNOSIS — Z95828 Presence of other vascular implants and grafts: Secondary | ICD-10-CM

## 2019-06-15 DIAGNOSIS — C786 Secondary malignant neoplasm of retroperitoneum and peritoneum: Secondary | ICD-10-CM | POA: Diagnosis not present

## 2019-06-15 MED ORDER — HEPARIN SOD (PORK) LOCK FLUSH 100 UNIT/ML IV SOLN
500.0000 [IU] | Freq: Once | INTRAVENOUS | Status: AC | PRN
  Administered 2019-06-15: 500 [IU]
  Filled 2019-06-15: qty 5

## 2019-06-15 MED ORDER — SODIUM CHLORIDE 0.9% FLUSH
10.0000 mL | INTRAVENOUS | Status: DC | PRN
  Administered 2019-06-15: 14:00:00 10 mL
  Filled 2019-06-15: qty 10

## 2019-06-15 NOTE — Patient Instructions (Signed)

## 2019-06-19 DIAGNOSIS — C182 Malignant neoplasm of ascending colon: Secondary | ICD-10-CM | POA: Diagnosis not present

## 2019-06-25 NOTE — Progress Notes (Signed)
Nathaniel Norton   [phone] Fax:(336) [phone]   Clinic Follow up Note   Patient Care Team: Lajean Manes, MD as PCP - General (Internal Medicine) Berle Mull, MD as Consulting Physician (Family Medicine) Clarene Essex, MD as Consulting Physician (Gastroenterology)  Date of Service:  06/27/2019  CHIEF COMPLAINT:  F/u ofmetastaticcolon cancer  SUMMARY OF ONCOLOGIC HISTORY: Oncology History Overview Note  Cancer Staging Cancer of right colon [facility]) Staging form: Colon and Rectum, AJCC 8th Edition - Clinical stage from 04/09/2019: Stage IVC (cTX, cNX, pM1c) - Signed by Truitt Merle, MD on 05/03/2019     Cancer of right colon [facility])  04/09/2019 Procedure   Colonoscopy 04/09/19 by Dr Watt Climes IMPRESSION -internal hemorrhoids -Diverticulosis in the sigmoid colon  -2 small polyps in the rectum and in the proximal transverse colon, removed with a hot snare. Resected and retrieved.  -3 medium polyps in the proximal transverse colon, in the mid transverse colon and in the distal transverse colon, removed and resected and retrieved.  -likely malignant partially obstructing tumor at the hepatic flexure. biopsied, tattooed.  -1 large polyp in the mid ascending colon  -the examination was otherwise normal     04/09/2019 Initial Biopsy   FINAL MICROSCOPIC DIAGNOSIS: 04/09/19 1. LG intestine-hepatic flexure, Biopsy:   INVASIVE WELL DIFFERENTIATED ADENOCARCINOMA   04/09/2019 Cancer Staging   Staging form: Colon and Rectum, AJCC 8th Edition - Clinical stage from 04/09/2019: Stage IVC (cTX, cNX, pM1c) - Signed by Truitt Merle, MD on 05/03/2019   04/10/2019 Imaging   CT AP 04/10/19  IMPRESSION: 1. There is an eccentric mass of the colon involving the ascending colon near the hepatic flexure measuring approximately 3.4 x 3.4 by 2.0 cm (series 2, image 37, series 3, image 37). There is extensive soft tissue nodularity of the mesocolon and omentum, and likely areas of the  peritoneum, for example bilateral upper quadrants (series 2, image 25). Findings are consistent with primary colon malignancy, probable omental and peritoneal involvement, and small volume malignant ascites. 2.  Other chronic and incidental findings as detailed above.   04/20/2019 Initial Diagnosis   Cancer of right colon ([place])   04/26/2019 Imaging   CT Chest 04/26/19 IMPRESSION: 1. Borderline to mild lower thoracic adenopathy, including within the right internal mammary and juxta cardiophrenic stations. Given the appearance of the upper abdomen, suspicious for nodal metastasis. 2. A low right paratracheal node is borderline sized, but favored to be reactive. 3. No evidence of pulmonary metastasis. 4. Peritoneal metastasis and abdominal ascites, as before. 5. Pancreatic parenchymal calcifications indicative of chronic calcific pancreatitis. 6. Coronary artery atherosclerosis.   04/27/2019 Pathology Results   Diagnosis 04/27/19 Peritoneum, biopsy, right upper quadrant, perihepatic - ADENOCARCINOMA, CONSISTENT WITH COLONIC PRIMARY. - SEE COMMENT.   05/16/2019 -  Chemotherapy   First line FOLFOX every 2 weeks with Avastin starting with cycle 2 starting 05/16/19      CURRENT THERAPY:  First lineFOLFOX every 2 weeks with Avastin starting with cycle 2 starting 05/16/19  INTERVAL HISTORY:  Nathaniel Norton is here for a follow up and treatment. He presents to the clinic alone. He notes he is doing fair. He notes he did not get sick from last chemo. He did have constipation which was better with cycle 3 as he took Miralax. This manages it. However he had 2 episodes of emesis during week 3. He restarted Doculax instead. He notes cold sensitivity with cycle 2 and 3 which resolved after 4-5 days. He also had jaw pain  when initially chewing his food for 2 days.  He denies neuropathy so far. He notes occasionally he will have upper abdominal pressure. He does not get this often.     REVIEW OF  SYSTEMS:   Constitutional: Denies fevers, chills or abnormal weight loss Eyes: Denies blurriness of vision Ears, nose, mouth, throat, and face: Denies mucositis or sore throat Respiratory: Denies cough, dyspnea or wheezes Cardiovascular: Denies palpitation, chest discomfort or lower extremity swelling Gastrointestinal:  Denies nausea, heartburn (+) constipation (+) occasional upper abdominal pressure Skin: Denies abnormal skin rashes MSK: (+) mild jaw pain with chewing Lymphatics: Denies new lymphadenopathy or easy bruising Neurological:Denies numbness, tingling or new weaknesses (+) Cold sensitivity  Behavioral/Psych: Mood is stable, no new changes  All other systems were reviewed with the patient and are negative.  MEDICAL HISTORY:  No past medical history on file.  SURGICAL HISTORY: Past Surgical History:  Procedure Laterality Date  . CATARACT EXTRACTION    . IR IMAGING GUIDED PORT INSERTION  05/10/2019  . L4 fracture  2012  . RETINAL DETACHMENT SURGERY    . Right hand surgery  1974    I have reviewed the social history and family history with the patient and they are unchanged from previous note.  ALLERGIES:  is allergic to feraheme [ferumoxytol] and codeine.  MEDICATIONS:  Current Outpatient Medications  Medication Sig Dispense Refill  . ferrous sulfate 325 (65 FE) MG tablet Take 325 mg by mouth daily with breakfast.    . lidocaine-prilocaine (EMLA) cream Apply to affected area once 30 g 3  . Multiple Vitamin (MULTIVITAMIN) tablet Take 1 tablet by mouth daily.    . ondansetron (ZOFRAN) 8 MG tablet Take 1 tablet (8 mg total) by mouth 2 (two) times daily as needed for refractory nausea / vomiting. Start on day 3 after chemotherapy. 30 tablet 1  . pantoprazole (PROTONIX) 20 MG tablet Take 1 tablet (20 mg total) by mouth daily. 30 tablet 0  . prochlorperazine (COMPAZINE) 10 MG tablet Take 1 tablet (10 mg total) by mouth every 6 (six) hours as needed (Nausea or vomiting). 30  tablet 1   No current facility-administered medications for this visit.    Facility-Administered Medications Ordered in Other Visits  Medication Dose Route Frequency Provider Last Rate Last Dose  . bevacizumab (AVASTIN) 375 mg in sodium chloride 0.9 % 100 mL chemo infusion  5 mg/kg (Treatment Plan Recorded) Intravenous Once Truitt Merle, MD      . dextrose 5 % solution   Intravenous Once Truitt Merle, MD      . fluorouracil (ADRUCIL) 4,550 mg in sodium chloride 0.9 % 59 mL chemo infusion  2,400 mg/m2 (Treatment Plan Recorded) Intravenous 1 day or 1 dose Truitt Merle, MD      . leucovorin 756 mg in dextrose 5 % 250 mL infusion  400 mg/m2 (Treatment Plan Recorded) Intravenous Once Truitt Merle, MD      . oxaliplatin (ELOXATIN) 130 mg in dextrose 5 % 500 mL chemo infusion  70 mg/m2 (Treatment Plan Recorded) Intravenous Once Truitt Merle, MD        PHYSICAL EXAMINATION: ECOG PERFORMANCE STATUS: 1 - Symptomatic but completely ambulatory  Vitals:   06/27/19 0902  BP: (!) 144/81  Pulse: (!) 59  Resp: 18  Temp: 98.5 F (36.9 C)  SpO2: 99%   Filed Weights   06/27/19 0902  Weight: 163 lb 9.6 oz (74.2 kg)    GENERAL:alert, no distress and comfortable SKIN: skin color, texture, turgor are normal,  no rashes or significant lesions EYES: normal, Conjunctiva are pink and non-injected, sclera clear  NECK: supple, thyroid normal size, non-tender, without nodularity LYMPH:  no palpable lymphadenopathy in the cervical, axillary  LUNGS: clear to auscultation and percussion with normal breathing effort HEART: regular rate & rhythm and no murmurs and no lower extremity edema ABDOMEN:abdomen soft, non-tender and normal bowel sounds Musculoskeletal:no cyanosis of digits and no clubbing  NEURO: alert & oriented x 3 with fluent speech, no focal motor/sensory deficits  LABORATORY DATA:  I have reviewed the data as listed CBC Latest Ref Rng & Units 06/27/2019 06/13/2019 05/30/2019  WBC 4.0 - 10.5 K/uL 3.6(L) 3.7(L) 4.1   Hemoglobin 13.0 - 17.0 g/dL 12.1(L) 11.6(L) 10.7(L)  Hematocrit 39.0 - 52.0 % 38.6(L) 37.8(L) 36.8(L)  Platelets 150 - 400 K/uL 109(L) 144(L) 163     CMP Latest Ref Rng & Units 06/27/2019 06/13/2019 05/30/2019  Glucose 70 - 99 mg/dL 90 80 90  BUN 8 - 23 mg/dL '11 11 10  ' Creatinine 0.61 - 1.24 mg/dL 0.86 0.90 0.88  Sodium 135 - 145 mmol/L 141 140 141  Potassium 3.5 - 5.1 mmol/L 3.9 4.3 4.2  Chloride 98 - 111 mmol/L 107 104 104  CO2 22 - 32 mmol/L '26 27 27  ' Calcium 8.9 - 10.3 mg/dL 9.0 9.1 9.0  Total Protein 6.5 - 8.1 g/dL 6.7 6.5 6.7  Total Bilirubin 0.3 - 1.2 mg/dL 0.8 0.7 0.5  Alkaline Phos 38 - 126 U/L 60 71 77  AST 15 - 41 U/L '17 19 17  ' ALT 0 - 44 U/L '16 18 18      ' RADIOGRAPHIC STUDIES: I have personally reviewed the radiological images as listed and agreed with the findings in the report. No results found.   ASSESSMENT & PLAN:  Nathaniel Norton is a 68 y.o. male with   1. Cancer of right colon,with peritoneal metastasis,stage IV,MMR normal -He was recently diagnosed in 03/2019. His Colonoscopy biopsy showed adenocarcinoma of right hepatic flexurecolon. -His peritoneal biopsy from 04/27/19 showsadenocarcinoma,consistent withmetastasis of his colon cancer. This is now stage IV disease which is no longer curable but still treatable.  -I reviewed his FO results which shows MSI-Stable, KRAS mutation (+). He is not eligible for immunotherapy at this time nor EGFR inhibitor. He will continue with biological agent Avastin. There are possible clinical trail options for later treatments.  -He has started first line FOLFOX q2weeks on 05/16/19, Avastin added with cycle 2. He is tolerating mostly well with constipation, cold sensitivity and mild jaw muscular pain.  -Labs reviewed, CBC and CMP WNL except WBC 3.6, Hg 12.1, PLT 109K,. Overall adequate to proceed with cycle 4 FOLFOX today with 15-20% dose reduction of oxaliplatin given thrombocytopenia.  -Plans to scan him after cycle 5  chemo.  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  -GI workup with coloscopy by Dr. Watt Climes showed internal hemorrhoids, benign polyps, diverticulosis and colon cancer.  -He was started on OTC multivitamin and oral iron in late 03/2019 and has been having black stool. This is likely from oral iron. -He has been treated with 1 doses of IV Feraheme on 05/03/19 and 05/16/19. He did have reaction to 2nd infusion and infusion was stopped -He had moderate response. Hg improved to 12.1 today (06/27/19). If he needs more IV iron in the future, I may given another type of iron.  3. Elevated PSA  -PSA was 5.7 in 11/2018 and 4.6 in 01/2019 -He plans to wait on seeing Urologist for work up given colon cancer diagnosis. Thisis understandable.   4. Social Support -He is single with no children and no family that [place]. -He lives alone in 1 level [employer]. -He does not have Internet access at home -He does have a cell phone which he can be reached. -If he needs help with transportation for treatment, I discussed our social workers can help him.  -I encouraged him to find someone to help him as needed at home.  5.Goal of care discussion  -We again discussed the incurable nature of his cancer, and the overall poor prognosis, especially if he does not have good response to chemotherapy or progress on chemo -The patient understands the goal of care is palliative. -he is full code now  6. Taste change, lower appetite  -Secondary to chemo -He lost 5 pounds after first cycle chemo but was able to gain it back, weight stable lately  -He will continue Ensure as needed and I encouraged him to eat more of what he can keep down.  -Will monitor. I will set up dietician consult  -With smaller frequent meals he is able to maintain weight   7. Constipation  -secondary  to chemo -Controlled with prophylactic Miralax, but after 2 episodes of emesis after taking it. He switched to Doculax    PLAN: -Labs reviewed and adequate to proceed with C4 FOLFOX and Avastin with oxaliplatin dose reduction to 79m/m2 due tp thrombocytopenia  -Lab, flush, f/u and chemo FOLFOX and Avastin in 2 weeks  -restaging CT AP with contras in 3-4 weeks    No problem-specific Assessment & Plan notes found for this encounter.   Orders Placed This Encounter  Procedures  . CT Abdomen Pelvis W Contrast    Standing Status:   Future    Standing Expiration Date:   06/26/2020    Order Specific Question:   If indicated for the ordered procedure, I authorize the administration of contrast media per Radiology protocol    Answer:   Yes    Order Specific Question:   [facility]    [facility]   Order Specific Question:   Is Oral Contrast requested for this exam?    Answer:   Yes, Per Radiology protocol    Order Specific Question:   Radiology Contrast Protocol - do NOT remove file path    Answer:   [email or web address]   All questions were answered. The patient knows to call the clinic with any problems, questions or concerns. No barriers to learning was detected. I spent 20 minutes counseling the patient face to face. The total time spent in the appointment was 25 minutes and more than 50% was on counseling and review of test results     YTruitt Merle MD 06/27/2019   I, AJoslyn Devon am acting as scribe for YTruitt Merle MD.   I have reviewed the above documentation for accuracy and completeness, and I agree with the above.

## 2019-06-27 ENCOUNTER — Other Ambulatory Visit: Payer: Self-pay

## 2019-06-27 ENCOUNTER — Inpatient Hospital Stay: Payer: Medicare Other

## 2019-06-27 ENCOUNTER — Telehealth: Payer: Self-pay | Admitting: Hematology

## 2019-06-27 ENCOUNTER — Inpatient Hospital Stay (HOSPITAL_BASED_OUTPATIENT_CLINIC_OR_DEPARTMENT_OTHER): Payer: Medicare Other | Admitting: Hematology

## 2019-06-27 ENCOUNTER — Encounter: Payer: Self-pay | Admitting: Hematology

## 2019-06-27 VITALS — BP 144/81 | HR 59 | Temp 98.5°F | Resp 18 | Ht 66.0 in | Wt 163.6 lb

## 2019-06-27 DIAGNOSIS — C182 Malignant neoplasm of ascending colon: Secondary | ICD-10-CM

## 2019-06-27 DIAGNOSIS — R590 Localized enlarged lymph nodes: Secondary | ICD-10-CM | POA: Diagnosis not present

## 2019-06-27 DIAGNOSIS — K5900 Constipation, unspecified: Secondary | ICD-10-CM

## 2019-06-27 DIAGNOSIS — Z95828 Presence of other vascular implants and grafts: Secondary | ICD-10-CM

## 2019-06-27 DIAGNOSIS — K5730 Diverticulosis of large intestine without perforation or abscess without bleeding: Secondary | ICD-10-CM

## 2019-06-27 DIAGNOSIS — Z5111 Encounter for antineoplastic chemotherapy: Secondary | ICD-10-CM | POA: Diagnosis not present

## 2019-06-27 DIAGNOSIS — R972 Elevated prostate specific antigen [PSA]: Secondary | ICD-10-CM

## 2019-06-27 DIAGNOSIS — I2510 Atherosclerotic heart disease of native coronary artery without angina pectoris: Secondary | ICD-10-CM

## 2019-06-27 DIAGNOSIS — K648 Other hemorrhoids: Secondary | ICD-10-CM | POA: Diagnosis not present

## 2019-06-27 DIAGNOSIS — C786 Secondary malignant neoplasm of retroperitoneum and peritoneum: Secondary | ICD-10-CM

## 2019-06-27 DIAGNOSIS — R6884 Jaw pain: Secondary | ICD-10-CM

## 2019-06-27 DIAGNOSIS — D500 Iron deficiency anemia secondary to blood loss (chronic): Secondary | ICD-10-CM

## 2019-06-27 DIAGNOSIS — Z5112 Encounter for antineoplastic immunotherapy: Secondary | ICD-10-CM

## 2019-06-27 DIAGNOSIS — R1110 Vomiting, unspecified: Secondary | ICD-10-CM | POA: Diagnosis not present

## 2019-06-27 DIAGNOSIS — R188 Other ascites: Secondary | ICD-10-CM | POA: Diagnosis not present

## 2019-06-27 DIAGNOSIS — Z79899 Other long term (current) drug therapy: Secondary | ICD-10-CM

## 2019-06-27 DIAGNOSIS — R5383 Other fatigue: Secondary | ICD-10-CM

## 2019-06-27 DIAGNOSIS — R634 Abnormal weight loss: Secondary | ICD-10-CM

## 2019-06-27 DIAGNOSIS — K921 Melena: Secondary | ICD-10-CM | POA: Diagnosis not present

## 2019-06-27 DIAGNOSIS — Z885 Allergy status to narcotic agent status: Secondary | ICD-10-CM

## 2019-06-27 DIAGNOSIS — R0602 Shortness of breath: Secondary | ICD-10-CM

## 2019-06-27 DIAGNOSIS — Z452 Encounter for adjustment and management of vascular access device: Secondary | ICD-10-CM | POA: Diagnosis not present

## 2019-06-27 LAB — CMP (CANCER CENTER ONLY)
ALT: 16 U/L (ref 0–44)
AST: 17 U/L (ref 15–41)
Albumin: 3.8 g/dL (ref 3.5–5.0)
Alkaline Phosphatase: 60 U/L (ref 38–126)
Anion gap: 8 (ref 5–15)
BUN: 11 mg/dL (ref 8–23)
CO2: 26 mmol/L (ref 22–32)
Calcium: 9 mg/dL (ref 8.9–10.3)
Chloride: 107 mmol/L (ref 98–111)
Creatinine: 0.86 mg/dL (ref 0.61–1.24)
GFR, Est AFR Am: >60 mL/min (ref >60)
GFR, Estimated: >60 mL/min (ref >60)
Glucose, Bld: 90 mg/dL (ref 70–99)
Potassium: 3.9 mmol/L (ref 3.5–5.1)
Sodium: 141 mmol/L (ref 135–145)
Total Bilirubin: 0.8 mg/dL (ref 0.3–1.2)
Total Protein: 6.7 g/dL (ref 6.5–8.1)

## 2019-06-27 LAB — CBC WITH DIFFERENTIAL (CANCER CENTER ONLY)
Abs Immature Granulocytes: 0 10*3/uL (ref 0.00–0.07)
Basophils Absolute: 0 10*3/uL (ref 0.0–0.1)
Basophils Relative: 0 %
Eosinophils Absolute: 0.1 10*3/uL (ref 0.0–0.5)
Eosinophils Relative: 1 %
HCT: 38.6 % — ABNORMAL LOW (ref 39.0–52.0)
Hemoglobin: 12.1 g/dL — ABNORMAL LOW (ref 13.0–17.0)
Immature Granulocytes: 0 %
Lymphocytes Relative: 29 %
Lymphs Abs: 1.1 10*3/uL (ref 0.7–4.0)
MCH: 23.4 pg — ABNORMAL LOW (ref 26.0–34.0)
MCHC: 31.3 g/dL (ref 30.0–36.0)
MCV: 74.7 fL — ABNORMAL LOW (ref 80.0–100.0)
Monocytes Absolute: 0.4 10*3/uL (ref 0.1–1.0)
Monocytes Relative: 12 %
Neutro Abs: 2.1 10*3/uL (ref 1.7–7.7)
Neutrophils Relative %: 58 %
Platelet Count: 109 10*3/uL — ABNORMAL LOW (ref 150–400)
RBC: 5.17 MIL/uL (ref 4.22–5.81)
RDW: 23.6 % — ABNORMAL HIGH (ref 11.5–15.5)
WBC Count: 3.6 10*3/uL — ABNORMAL LOW (ref 4.0–10.5)
nRBC: 0 % (ref 0.0–0.2)

## 2019-06-27 MED ORDER — PALONOSETRON HCL INJECTION 0.25 MG/5ML
INTRAVENOUS | Status: AC
  Filled 2019-06-27: qty 5

## 2019-06-27 MED ORDER — SODIUM CHLORIDE 0.9 % IV SOLN
2400.0000 mg/m2 | INTRAVENOUS | Status: DC
  Administered 2019-06-27: 4550 mg via INTRAVENOUS
  Filled 2019-06-27: qty 91

## 2019-06-27 MED ORDER — SODIUM CHLORIDE 0.9% FLUSH
10.0000 mL | INTRAVENOUS | Status: DC | PRN
  Administered 2019-06-27: 10 mL
  Filled 2019-06-27: qty 10

## 2019-06-27 MED ORDER — PALONOSETRON HCL INJECTION 0.25 MG/5ML
0.2500 mg | Freq: Once | INTRAVENOUS | Status: AC
  Administered 2019-06-27: 0.25 mg via INTRAVENOUS

## 2019-06-27 MED ORDER — DEXAMETHASONE SODIUM PHOSPHATE 10 MG/ML IJ SOLN
10.0000 mg | Freq: Once | INTRAMUSCULAR | Status: AC
  Administered 2019-06-27: 10 mg via INTRAVENOUS

## 2019-06-27 MED ORDER — DEXAMETHASONE SODIUM PHOSPHATE 10 MG/ML IJ SOLN
INTRAMUSCULAR | Status: AC
  Filled 2019-06-27: qty 1

## 2019-06-27 MED ORDER — SODIUM CHLORIDE 0.9 % IV SOLN
INTRAVENOUS | Status: DC
  Administered 2019-06-27: 10:00:00 via INTRAVENOUS
  Filled 2019-06-27: qty 250

## 2019-06-27 MED ORDER — OXALIPLATIN CHEMO INJECTION 100 MG/20ML
70.0000 mg/m2 | Freq: Once | INTRAVENOUS | Status: AC
  Administered 2019-06-27: 130 mg via INTRAVENOUS
  Filled 2019-06-27: qty 20

## 2019-06-27 MED ORDER — DEXTROSE 5 % IV SOLN
Freq: Once | INTRAVENOUS | Status: AC
  Administered 2019-06-27: 11:00:00 via INTRAVENOUS
  Filled 2019-06-27: qty 250

## 2019-06-27 MED ORDER — LEUCOVORIN CALCIUM INJECTION 350 MG
400.0000 mg/m2 | Freq: Once | INTRAVENOUS | Status: AC
  Administered 2019-06-27: 756 mg via INTRAVENOUS
  Filled 2019-06-27: qty 37.8

## 2019-06-27 MED ORDER — SODIUM CHLORIDE 0.9 % IV SOLN
5.2000 mg/kg | Freq: Once | INTRAVENOUS | Status: AC
  Administered 2019-06-27: 400 mg via INTRAVENOUS
  Filled 2019-06-27: qty 16

## 2019-06-27 NOTE — Telephone Encounter (Signed)
Scheduled appt per 7/15 los.  Printed calendar and avs.  Gave patient contrast and the number to central radiology.

## 2019-06-27 NOTE — Patient Instructions (Signed)

## 2019-06-27 NOTE — Patient Instructions (Signed)
[place] Discharge Instructions for Patients Receiving Chemotherapy  Today you received the following chemotherapy agents: Avastin, leucovorin, Oxaliplatin, 5FU pump  To help prevent nausea and vomiting after your treatment, we encourage you to take your nausea medication as directed.    If you develop nausea and vomiting that is not controlled by your nausea medication, call the clinic.   BELOW ARE SYMPTOMS THAT SHOULD BE REPORTED IMMEDIATELY:  *FEVER GREATER THAN 100.5 F  *CHILLS WITH OR WITHOUT FEVER  NAUSEA AND VOMITING THAT IS NOT CONTROLLED WITH YOUR NAUSEA MEDICATION  *UNUSUAL SHORTNESS OF BREATH  *UNUSUAL BRUISING OR BLEEDING  TENDERNESS IN MOUTH AND THROAT WITH OR WITHOUT PRESENCE OF ULCERS  *URINARY PROBLEMS  *BOWEL PROBLEMS  UNUSUAL RASH Items with * indicate a potential emergency and should be followed up as soon as possible.  Feel free to call the clinic should you have any questions or concerns. The clinic phone number is [phone].  Please show the [place] at check-in to the Emergency Department and triage nurse.  Coronavirus (COVID-19) Are you at risk?  Are you at risk for the Coronavirus (COVID-19)?  To be considered HIGH RISK for Coronavirus (COVID-19), you have to meet the following criteria:  . Traveled to [place], [place], [place], [place] or [place]; or in the [place] to [place], [place], [place], or [place]; and have fever, cough, and shortness of breath within the last 2 weeks of travel OR . Been in close contact with a person diagnosed with COVID-19 within the last 2 weeks and have fever, cough, and shortness of breath . IF YOU DO NOT MEET THESE CRITERIA, YOU ARE CONSIDERED LOW RISK FOR COVID-19.  What to do if you are HIGH RISK for COVID-19?  Marland Kitchen If you are having a medical emergency, call 911. . Seek medical care right away. Before you go to a doctor's office, urgent care or emergency department, call  ahead and tell them about your recent travel, contact with someone diagnosed with COVID-19, and your symptoms. You should receive instructions from your physician's office regarding next steps of care.  . When you arrive at healthcare provider, tell the healthcare staff immediately you have returned from visiting [place], [place], [place], [place] or [place]; or traveled in the [place] to [place], [place], [place], or [place]; in the last two weeks or you have been in close contact with a person diagnosed with COVID-19 in the last 2 weeks.   . Tell the health care staff about your symptoms: fever, cough and shortness of breath. . After you have been seen by a medical provider, you will be either: o Tested for (COVID-19) and discharged home on quarantine except to seek medical care if symptoms worsen, and asked to  - Stay home and avoid contact with others until you get your results (4-5 days)  - Avoid travel on public transportation if possible (such as bus, train, or airplane) or o Sent to the Emergency Department by EMS for evaluation, COVID-19 testing, and possible admission depending on your condition and test results.  What to do if you are LOW RISK for COVID-19?  Reduce your risk of any infection by using the same precautions used for avoiding the common cold or flu:  Marland Kitchen Wash your hands often with soap and warm water for at least 20 seconds.  If soap and water are not readily available, use an alcohol-based hand sanitizer with at least 60% alcohol.  Marland Kitchen  If coughing or sneezing, cover your mouth and nose by coughing or sneezing into the elbow areas of your shirt or coat, into a tissue or into your sleeve (not your hands). . Avoid shaking hands with others and consider head nods or verbal greetings only. . Avoid touching your eyes, nose, or mouth with unwashed hands.  . Avoid close contact with people who are sick. . Avoid places or events with large numbers of people in one location,  like concerts or sporting events. . Carefully consider travel plans you have or are making. . If you are planning any travel outside or inside the [place], visit the CDC's Travelers' Health webpage for the latest health notices. . If you have some symptoms but not all symptoms, continue to monitor at home and seek medical attention if your symptoms worsen. . If you are having a medical emergency, call 911.   [place] / e-Visit: [email or web address]         [facility]: [facility]: [phone]                   [facility]: [phone]

## 2019-06-29 ENCOUNTER — Other Ambulatory Visit: Payer: Self-pay

## 2019-06-29 ENCOUNTER — Inpatient Hospital Stay: Payer: Medicare Other

## 2019-06-29 VITALS — BP 143/82 | HR 58 | Temp 98.9°F | Resp 18

## 2019-06-29 DIAGNOSIS — C182 Malignant neoplasm of ascending colon: Secondary | ICD-10-CM | POA: Diagnosis not present

## 2019-06-29 DIAGNOSIS — Z95828 Presence of other vascular implants and grafts: Secondary | ICD-10-CM

## 2019-06-29 DIAGNOSIS — D500 Iron deficiency anemia secondary to blood loss (chronic): Secondary | ICD-10-CM

## 2019-06-29 DIAGNOSIS — Z5111 Encounter for antineoplastic chemotherapy: Secondary | ICD-10-CM | POA: Diagnosis not present

## 2019-06-29 DIAGNOSIS — K648 Other hemorrhoids: Secondary | ICD-10-CM | POA: Diagnosis not present

## 2019-06-29 DIAGNOSIS — Z452 Encounter for adjustment and management of vascular access device: Secondary | ICD-10-CM | POA: Diagnosis not present

## 2019-06-29 DIAGNOSIS — C786 Secondary malignant neoplasm of retroperitoneum and peritoneum: Secondary | ICD-10-CM | POA: Diagnosis not present

## 2019-06-29 DIAGNOSIS — Z5112 Encounter for antineoplastic immunotherapy: Secondary | ICD-10-CM | POA: Diagnosis not present

## 2019-06-29 MED ORDER — SODIUM CHLORIDE 0.9% FLUSH
10.0000 mL | INTRAVENOUS | Status: DC | PRN
  Administered 2019-06-29: 10 mL
  Filled 2019-06-29: qty 10

## 2019-06-29 MED ORDER — HEPARIN SOD (PORK) LOCK FLUSH 100 UNIT/ML IV SOLN
500.0000 [IU] | Freq: Once | INTRAVENOUS | Status: AC | PRN
  Administered 2019-06-29: 500 [IU]
  Filled 2019-06-29: qty 5

## 2019-06-29 NOTE — Patient Instructions (Signed)

## 2019-07-09 NOTE — Progress Notes (Signed)
[facility]   [phone] [phone]   Clinic Follow up Note   Patient Care Team: Lajean Manes, MD as PCP - General (Internal Medicine) Berle Mull, MD as Consulting Physician (Family Medicine) Clarene Essex, MD as Consulting Physician (Gastroenterology)  Date of Service:  07/11/2019  CHIEF COMPLAINT: F/u ofmetastaticcolon cancer  SUMMARY OF ONCOLOGIC HISTORY: Oncology History Overview Note  Cancer Staging Cancer of right colon [facility]) Staging form: Colon and Rectum, AJCC 8th Edition - Clinical stage from 04/09/2019: Stage IVC (cTX, cNX, pM1c) - Signed by Truitt Merle, MD on 05/03/2019     Cancer of right colon [facility])  04/09/2019 Procedure   Colonoscopy 04/09/19 by Dr Watt Climes IMPRESSION -internal hemorrhoids -Diverticulosis in the sigmoid colon  -2 small polyps in the rectum and in the proximal transverse colon, removed with a hot snare. Resected and retrieved.  -3 medium polyps in the proximal transverse colon, in the mid transverse colon and in the distal transverse colon, removed and resected and retrieved.  -likely malignant partially obstructing tumor at the hepatic flexure. biopsied, tattooed.  -1 large polyp in the mid ascending colon  -the examination was otherwise normal     04/09/2019 Initial Biopsy   FINAL MICROSCOPIC DIAGNOSIS: 04/09/19 1. LG intestine-hepatic flexure, Biopsy:   INVASIVE WELL DIFFERENTIATED ADENOCARCINOMA   04/09/2019 Cancer Staging   Staging form: Colon and Rectum, AJCC 8th Edition - Clinical stage from 04/09/2019: Stage IVC (cTX, cNX, pM1c) - Signed by Truitt Merle, MD on 05/03/2019   04/10/2019 Imaging   CT AP 04/10/19  IMPRESSION: 1. There is an eccentric mass of the colon involving the ascending colon near the hepatic flexure measuring approximately 3.4 x 3.4 by 2.0 cm (series 2, image 37, series 3, image 37). There is extensive soft tissue nodularity of the mesocolon and omentum, and likely areas of the  peritoneum, for example bilateral upper quadrants (series 2, image 25). Findings are consistent with primary colon malignancy, probable omental and peritoneal involvement, and small volume malignant ascites. 2.  Other chronic and incidental findings as detailed above.   04/20/2019 Initial Diagnosis   Cancer of right colon ([place])   04/26/2019 Imaging   CT Chest 04/26/19 IMPRESSION: 1. Borderline to mild lower thoracic adenopathy, including within the right internal mammary and juxta cardiophrenic stations. Given the appearance of the upper abdomen, suspicious for nodal metastasis. 2. A low right paratracheal node is borderline sized, but favored to be reactive. 3. No evidence of pulmonary metastasis. 4. Peritoneal metastasis and abdominal ascites, as before. 5. Pancreatic parenchymal calcifications indicative of chronic calcific pancreatitis. 6. Coronary artery atherosclerosis.   04/27/2019 Pathology Results   Diagnosis 04/27/19 Peritoneum, biopsy, right upper quadrant, perihepatic - ADENOCARCINOMA, CONSISTENT WITH COLONIC PRIMARY. - SEE COMMENT.   05/16/2019 -  Chemotherapy   First line FOLFOX every 2 weeks with Avastin starting with cycle 2 starting 05/16/19      CURRENT THERAPY:  First lineFOLFOX every 2 weeks with Avastin starting with cycle 2starting 05/16/19  INTERVAL HISTORY:  Nathaniel Norton is here for a follow up and treatment. He presents to the clinic alone. He notes he is doing well. He notes with dose reduction he felt better with less constipation. He notes he still had taste change and lower appetite for a few days but with week 2 he felt back to feeling well.  He has stye over right eyelid. This has been going on for a week and gotten more swollen yesterday. He notes he has been taking Protonix  but not sure if he needs it anymore as he is near the end of the prescription. He will monitor. He is concerned with diarrhea from CT contrast the day of his scan.    REVIEW  OF SYSTEMS:   Constitutional: Denies fevers, chills or abnormal weight loss Eyes: Denies blurriness of vision Ears, nose, mouth, throat, and face: Denies mucositis or sore throat Respiratory: Denies cough, dyspnea or wheezes Cardiovascular: Denies palpitation, chest discomfort or lower extremity swelling Gastrointestinal:  Denies nausea, heartburn or change in bowel habits Skin: Denies abnormal skin rashes (+) Stye over right eyelid Lymphatics: Denies new lymphadenopathy or easy bruising Neurological:Denies numbness, tingling or new weaknesses Behavioral/Psych: Mood is stable, no new changes  All other systems were reviewed with the patient and are negative.  MEDICAL HISTORY:  History reviewed. No pertinent past medical history.  SURGICAL HISTORY: Past Surgical History:  Procedure Laterality Date  . CATARACT EXTRACTION    . IR IMAGING GUIDED PORT INSERTION  05/10/2019  . L4 fracture  2012  . RETINAL DETACHMENT SURGERY    . Right hand surgery  1974    I have reviewed the social history and family history with the patient and they are unchanged from previous note.  ALLERGIES:  is allergic to feraheme [ferumoxytol] and codeine.  MEDICATIONS:  Current Outpatient Medications  Medication Sig Dispense Refill  . ferrous sulfate 325 (65 FE) MG tablet Take 325 mg by mouth daily with breakfast.    . lidocaine-prilocaine (EMLA) cream Apply to affected area once 30 g 3  . Multiple Vitamin (MULTIVITAMIN) tablet Take 1 tablet by mouth daily.    . ondansetron (ZOFRAN) 8 MG tablet Take 1 tablet (8 mg total) by mouth 2 (two) times daily as needed for refractory nausea / vomiting. Start on day 3 after chemotherapy. 30 tablet 1  . pantoprazole (PROTONIX) 20 MG tablet Take 1 tablet (20 mg total) by mouth daily. 30 tablet 0  . prochlorperazine (COMPAZINE) 10 MG tablet Take 1 tablet (10 mg total) by mouth every 6 (six) hours as needed (Nausea or vomiting). 30 tablet 1   No current  facility-administered medications for this visit.     PHYSICAL EXAMINATION: ECOG PERFORMANCE STATUS: 1 - Symptomatic but completely ambulatory  Vitals:   07/11/19 1005  BP: 137/77  Pulse: 62  Resp: 18  Temp: 98.5 F (36.9 C)  SpO2: 99%   Filed Weights   07/11/19 1005  Weight: 163 lb 1.6 oz (74 kg)    GENERAL:alert, no distress and comfortable SKIN: skin color, texture, turgor are normal, no rashes (+) Stye on right eyelid  EYES: normal, Conjunctiva are pink and non-injected, sclera clear   NECK: supple, thyroid normal size, non-tender, without nodularity LYMPH:  no palpable lymphadenopathy in the cervical, axillary  LUNGS: clear to auscultation and percussion with normal breathing effort HEART: regular rate & rhythm and no murmurs and no lower extremity edema ABDOMEN:abdomen soft, non-tender and normal bowel sounds Musculoskeletal:no cyanosis of digits and no clubbing  NEURO: alert & oriented x 3 with fluent speech, no focal motor/sensory deficits  LABORATORY DATA:  I have reviewed the data as listed CBC Latest Ref Rng & Units 07/11/2019 06/27/2019 06/13/2019  WBC 4.0 - 10.5 K/uL 3.5(L) 3.6(L) 3.7(L)  Hemoglobin 13.0 - 17.0 g/dL 12.7(L) 12.1(L) 11.6(L)  Hematocrit 39.0 - 52.0 % 40.9 38.6(L) 37.8(L)  Platelets 150 - 400 K/uL 108(L) 109(L) 144(L)     CMP Latest Ref Rng & Units 07/11/2019 06/27/2019 06/13/2019  Glucose 70 -  99 mg/dL 103(H) 90 80  BUN 8 - 23 mg/dL '13 11 11  ' Creatinine 0.61 - 1.24 mg/dL 0.90 0.86 0.90  Sodium 135 - 145 mmol/L 140 141 140  Potassium 3.5 - 5.1 mmol/L 4.6 3.9 4.3  Chloride 98 - 111 mmol/L 106 107 104  CO2 22 - 32 mmol/L '27 26 27  ' Calcium 8.9 - 10.3 mg/dL 9.6 9.0 9.1  Total Protein 6.5 - 8.1 g/dL 6.8 6.7 6.5  Total Bilirubin 0.3 - 1.2 mg/dL 1.0 0.8 0.7  Alkaline Phos 38 - 126 U/L 61 60 71  AST 15 - 41 U/L '17 17 19  ' ALT 0 - 44 U/L '16 16 18      ' RADIOGRAPHIC STUDIES: I have personally reviewed the radiological images as listed and agreed with  the findings in the report. No results found.   ASSESSMENT & PLAN:  Nathaniel Norton is a 68 y.o. male with   1. Cancer of right colon,with peritoneal metastasis,stage IV,MMR normal -He wasrecentlydiagnosed in 03/2019. His Colonoscopy biopsy showed adenocarcinoma of right hepatic flexurecolon. -His peritoneal biopsy from 04/27/19 showsadenocarcinoma,consistent withmetastasis of his colon cancer. This is now stage IV disease which is no longer curable but still treatable.  -I reviewed his FO results which shows MSI-Stable, KRAS mutation (+). He is not eligible for immunotherapy at this time nor EGFR inhibitor. He will continue with biological agent Avastin. There are possible clinical trail options for later treatments.  -He has startedfirst line FOLFOX q2weekson6/3/20, Avastin added with cycle 2. He is tolerating mostly well with constipation, cold sensitivity and mild jaw muscular pain.  -He tolerated chemo better with dose reduction with less constipation and recovered faster.  -Labs reviewed, CBC and CMP WNL except WBC 3.5, Hg 12.7, PLT 108K. Ferritin is still pending. Overall adequate to proceed with cycle 5 FOLFOX today at same dose.  -Plan to scan him on 07/23/19. I discussed contrast options to help him best tolerate.  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  -GI workup with coloscopy by Dr. Watt Climes showed internal hemorrhoids, benign polyps, diverticulosis and colon cancer.  -He started on OTC multivitamin and oral iron in late 03/2019 and has been having black stool. This is likely from oral iron. -He has been treated with1doses of IV Feraheme on 05/03/19 and 05/16/19. He did have reaction to 2nd infusionand infusion was stopped. He hadmoderateresponse.  -Today Hg 12.7, ferritin still pending (07/11/19). If he needs more IV iron in the future, I may given another type of  iron.  3. Elevated PSA  -PSA was 5.7 in 11/2018 and 4.6 in 01/2019 -He plans to wait on seeing Urologist for work up given colon cancer diagnosis. Thisis understandable.   4. Social Support -He is single with no children and no family that [place]. -He lives alone in 1 level [employer]. -He does not have Internet access at home -He does have a cell phone which he can be reached. -If he needs help with transportation for treatment, I discussed our social workers can help him.  -I encouraged him to find someone to help him as needed at home.  5.Goal of care discussion  -The patient understands the goal of care is palliative. -he is full code now  6. Taste change, lower appetite  -Secondary to chemo -He lost 5 poundsafter first cycle chemobut was able to gain it back, weight stable lately  -  He will continue Ensure as needed and I encouraged him to eat more of what he can keep down.  -Will monitor. I will set up dietician consult -With smaller frequent meals he is able to maintain weight. He is able to recover during week 2.   7. Constipation  -secondary to chemo -Controlled with prophylactic Miralax, but after 2 episodes of emesis after taking it. He switched to Doculax  -Improved with chemo dose reduction    PLAN: -Labs reviewed and adequate to proceed with C5 FOLFOX and Avastin  -Lab, flush, f/u and chemo FOLFOX and Avastin in 2 weeks -restaging CT AP with contrast on 07/23/19    No problem-specific Assessment & Plan notes found for this encounter.   No orders of the defined types were placed in this encounter.  All questions were answered. The patient knows to call the clinic with any problems, questions or concerns. No barriers to learning was detected. I spent 20 minutes counseling the patient face to face. The total time spent in the appointment was 25 minutes and more than 50% was on counseling and review of test results      Truitt Merle, MD 07/11/2019   I, Joslyn Devon, am acting as scribe for Truitt Merle, MD.   I have reviewed the above documentation for accuracy and completeness, and I agree with the above.

## 2019-07-11 ENCOUNTER — Other Ambulatory Visit: Payer: Self-pay

## 2019-07-11 ENCOUNTER — Inpatient Hospital Stay: Payer: Medicare Other

## 2019-07-11 ENCOUNTER — Encounter: Payer: Self-pay | Admitting: Hematology

## 2019-07-11 ENCOUNTER — Inpatient Hospital Stay (HOSPITAL_BASED_OUTPATIENT_CLINIC_OR_DEPARTMENT_OTHER): Payer: Medicare Other | Admitting: Hematology

## 2019-07-11 VITALS — BP 144/82

## 2019-07-11 VITALS — BP 137/77 | HR 62 | Temp 98.5°F | Resp 18 | Ht 66.0 in | Wt 163.1 lb

## 2019-07-11 DIAGNOSIS — Z5111 Encounter for antineoplastic chemotherapy: Secondary | ICD-10-CM | POA: Diagnosis not present

## 2019-07-11 DIAGNOSIS — Z452 Encounter for adjustment and management of vascular access device: Secondary | ICD-10-CM | POA: Diagnosis not present

## 2019-07-11 DIAGNOSIS — K5730 Diverticulosis of large intestine without perforation or abscess without bleeding: Secondary | ICD-10-CM

## 2019-07-11 DIAGNOSIS — R590 Localized enlarged lymph nodes: Secondary | ICD-10-CM

## 2019-07-11 DIAGNOSIS — D500 Iron deficiency anemia secondary to blood loss (chronic): Secondary | ICD-10-CM

## 2019-07-11 DIAGNOSIS — R5383 Other fatigue: Secondary | ICD-10-CM

## 2019-07-11 DIAGNOSIS — R972 Elevated prostate specific antigen [PSA]: Secondary | ICD-10-CM

## 2019-07-11 DIAGNOSIS — R1110 Vomiting, unspecified: Secondary | ICD-10-CM | POA: Diagnosis not present

## 2019-07-11 DIAGNOSIS — K921 Melena: Secondary | ICD-10-CM | POA: Diagnosis not present

## 2019-07-11 DIAGNOSIS — H00013 Hordeolum externum right eye, unspecified eyelid: Secondary | ICD-10-CM

## 2019-07-11 DIAGNOSIS — R0602 Shortness of breath: Secondary | ICD-10-CM

## 2019-07-11 DIAGNOSIS — R188 Other ascites: Secondary | ICD-10-CM | POA: Diagnosis not present

## 2019-07-11 DIAGNOSIS — Z79899 Other long term (current) drug therapy: Secondary | ICD-10-CM

## 2019-07-11 DIAGNOSIS — Z95828 Presence of other vascular implants and grafts: Secondary | ICD-10-CM

## 2019-07-11 DIAGNOSIS — K5900 Constipation, unspecified: Secondary | ICD-10-CM

## 2019-07-11 DIAGNOSIS — I2510 Atherosclerotic heart disease of native coronary artery without angina pectoris: Secondary | ICD-10-CM

## 2019-07-11 DIAGNOSIS — C786 Secondary malignant neoplasm of retroperitoneum and peritoneum: Secondary | ICD-10-CM

## 2019-07-11 DIAGNOSIS — C182 Malignant neoplasm of ascending colon: Secondary | ICD-10-CM

## 2019-07-11 DIAGNOSIS — Z885 Allergy status to narcotic agent status: Secondary | ICD-10-CM

## 2019-07-11 DIAGNOSIS — R6884 Jaw pain: Secondary | ICD-10-CM

## 2019-07-11 DIAGNOSIS — Z5112 Encounter for antineoplastic immunotherapy: Secondary | ICD-10-CM | POA: Diagnosis not present

## 2019-07-11 DIAGNOSIS — K648 Other hemorrhoids: Secondary | ICD-10-CM

## 2019-07-11 DIAGNOSIS — R634 Abnormal weight loss: Secondary | ICD-10-CM

## 2019-07-11 LAB — CBC WITH DIFFERENTIAL (CANCER CENTER ONLY)
Abs Immature Granulocytes: 0.01 10*3/uL (ref 0.00–0.07)
Basophils Absolute: 0 10*3/uL (ref 0.0–0.1)
Basophils Relative: 0 %
Eosinophils Absolute: 0 10*3/uL (ref 0.0–0.5)
Eosinophils Relative: 1 %
HCT: 40.9 % (ref 39.0–52.0)
Hemoglobin: 12.7 g/dL — ABNORMAL LOW (ref 13.0–17.0)
Immature Granulocytes: 0 %
Lymphocytes Relative: 27 %
Lymphs Abs: 1 10*3/uL (ref 0.7–4.0)
MCH: 23.8 pg — ABNORMAL LOW (ref 26.0–34.0)
MCHC: 31.1 g/dL (ref 30.0–36.0)
MCV: 76.7 fL — ABNORMAL LOW (ref 80.0–100.0)
Monocytes Absolute: 0.4 10*3/uL (ref 0.1–1.0)
Monocytes Relative: 11 %
Neutro Abs: 2.1 10*3/uL (ref 1.7–7.7)
Neutrophils Relative %: 61 %
Platelet Count: 108 10*3/uL — ABNORMAL LOW (ref 150–400)
RBC: 5.33 MIL/uL (ref 4.22–5.81)
RDW: 22.1 % — ABNORMAL HIGH (ref 11.5–15.5)
WBC Count: 3.5 10*3/uL — ABNORMAL LOW (ref 4.0–10.5)
nRBC: 0 % (ref 0.0–0.2)

## 2019-07-11 LAB — CMP (CANCER CENTER ONLY)
ALT: 16 U/L (ref 0–44)
AST: 17 U/L (ref 15–41)
Albumin: 3.9 g/dL (ref 3.5–5.0)
Alkaline Phosphatase: 61 U/L (ref 38–126)
Anion gap: 7 (ref 5–15)
BUN: 13 mg/dL (ref 8–23)
CO2: 27 mmol/L (ref 22–32)
Calcium: 9.6 mg/dL (ref 8.9–10.3)
Chloride: 106 mmol/L (ref 98–111)
Creatinine: 0.9 mg/dL (ref 0.61–1.24)
GFR, Est AFR Am: >60 mL/min (ref >60)
GFR, Estimated: >60 mL/min (ref >60)
Glucose, Bld: 103 mg/dL — ABNORMAL HIGH (ref 70–99)
Potassium: 4.6 mmol/L (ref 3.5–5.1)
Sodium: 140 mmol/L (ref 135–145)
Total Bilirubin: 1 mg/dL (ref 0.3–1.2)
Total Protein: 6.8 g/dL (ref 6.5–8.1)

## 2019-07-11 LAB — FERRITIN: Ferritin: 71 ng/mL (ref 24–336)

## 2019-07-11 MED ORDER — DEXTROSE 5 % IV SOLN
Freq: Once | INTRAVENOUS | Status: AC
  Administered 2019-07-11: 12:00:00 via INTRAVENOUS
  Filled 2019-07-11: qty 250

## 2019-07-11 MED ORDER — LEUCOVORIN CALCIUM INJECTION 350 MG
400.0000 mg/m2 | Freq: Once | INTRAVENOUS | Status: AC
  Administered 2019-07-11: 756 mg via INTRAVENOUS
  Filled 2019-07-11: qty 37.8

## 2019-07-11 MED ORDER — SODIUM CHLORIDE 0.9 % IV SOLN
5.2000 mg/kg | Freq: Once | INTRAVENOUS | Status: AC
  Administered 2019-07-11: 400 mg via INTRAVENOUS
  Filled 2019-07-11: qty 16

## 2019-07-11 MED ORDER — PALONOSETRON HCL INJECTION 0.25 MG/5ML
INTRAVENOUS | Status: AC
  Filled 2019-07-11: qty 5

## 2019-07-11 MED ORDER — SODIUM CHLORIDE 0.9% FLUSH
10.0000 mL | INTRAVENOUS | Status: DC | PRN
  Administered 2019-07-11: 10 mL
  Filled 2019-07-11: qty 10

## 2019-07-11 MED ORDER — SODIUM CHLORIDE 0.9 % IV SOLN
INTRAVENOUS | Status: DC
  Administered 2019-07-11: 11:00:00 via INTRAVENOUS
  Filled 2019-07-11: qty 250

## 2019-07-11 MED ORDER — PALONOSETRON HCL INJECTION 0.25 MG/5ML
0.2500 mg | Freq: Once | INTRAVENOUS | Status: AC
  Administered 2019-07-11: 0.25 mg via INTRAVENOUS

## 2019-07-11 MED ORDER — SODIUM CHLORIDE 0.9 % IV SOLN
2400.0000 mg/m2 | INTRAVENOUS | Status: DC
  Administered 2019-07-11: 4550 mg via INTRAVENOUS
  Filled 2019-07-11: qty 91

## 2019-07-11 MED ORDER — OXALIPLATIN CHEMO INJECTION 100 MG/20ML
70.0000 mg/m2 | Freq: Once | INTRAVENOUS | Status: AC
  Administered 2019-07-11: 130 mg via INTRAVENOUS
  Filled 2019-07-11: qty 20

## 2019-07-11 MED ORDER — SODIUM CHLORIDE 0.9% FLUSH
10.0000 mL | INTRAVENOUS | Status: DC | PRN
  Filled 2019-07-11: qty 10

## 2019-07-11 MED ORDER — HEPARIN SOD (PORK) LOCK FLUSH 100 UNIT/ML IV SOLN
500.0000 [IU] | Freq: Once | INTRAVENOUS | Status: DC | PRN
  Filled 2019-07-11: qty 5

## 2019-07-11 MED ORDER — DEXAMETHASONE SODIUM PHOSPHATE 10 MG/ML IJ SOLN
INTRAMUSCULAR | Status: AC
  Filled 2019-07-11: qty 1

## 2019-07-11 MED ORDER — DEXAMETHASONE SODIUM PHOSPHATE 10 MG/ML IJ SOLN
10.0000 mg | Freq: Once | INTRAMUSCULAR | Status: AC
  Administered 2019-07-11: 10 mg via INTRAVENOUS

## 2019-07-11 NOTE — Patient Instructions (Signed)
[facility] Discharge Instructions for Patients Receiving Chemotherapy  Today you received the following chemotherapy agents: Avastin, leucovorin, Oxaliplatin, 5FU pump  To help prevent nausea and vomiting after your treatment, we encourage you to take your nausea medication as directed.    If you develop nausea and vomiting that is not controlled by your nausea medication, call the clinic.   BELOW ARE SYMPTOMS THAT SHOULD BE REPORTED IMMEDIATELY:  *FEVER GREATER THAN 100.5 F  *CHILLS WITH OR WITHOUT FEVER  NAUSEA AND VOMITING THAT IS NOT CONTROLLED WITH YOUR NAUSEA MEDICATION  *UNUSUAL SHORTNESS OF BREATH  *UNUSUAL BRUISING OR BLEEDING  TENDERNESS IN MOUTH AND THROAT WITH OR WITHOUT PRESENCE OF ULCERS  *URINARY PROBLEMS  *BOWEL PROBLEMS  UNUSUAL RASH Items with * indicate a potential emergency and should be followed up as soon as possible.  Feel free to call the clinic should you have any questions or concerns. The clinic phone number is [phone].  Please show the [facility] at check-in to the Emergency Department and triage nurse.  Coronavirus (COVID-19) Are you at risk?  Are you at risk for the Coronavirus (COVID-19)?  To be considered HIGH RISK for Coronavirus (COVID-19), you have to meet the following criteria:  . Traveled to [place], [place], [place], [place] or [place]; or in the [place] to [place], [place], [place], or [place]; and have fever, cough, and shortness of breath within the last 2 weeks of travel OR . Been in close contact with a person diagnosed with COVID-19 within the last 2 weeks and have fever, cough, and shortness of breath . IF YOU DO NOT MEET THESE CRITERIA, YOU ARE CONSIDERED LOW RISK FOR COVID-19.  What to do if you are HIGH RISK for COVID-19?  Marland Kitchen If you are having a medical emergency, call 911. . Seek medical care right away. Before you go to a doctor's office, urgent care or emergency department, call  ahead and tell them about your recent travel, contact with someone diagnosed with COVID-19, and your symptoms. You should receive instructions from your physician's office regarding next steps of care.  . When you arrive at healthcare provider, tell the healthcare staff immediately you have returned from visiting [place], [place], [place], [place] or [place]; or traveled in the [place] to [place], [place], [place], or [place]; in the last two weeks or you have been in close contact with a person diagnosed with COVID-19 in the last 2 weeks.   . Tell the health care staff about your symptoms: fever, cough and shortness of breath. . After you have been seen by a medical provider, you will be either: o Tested for (COVID-19) and discharged home on quarantine except to seek medical care if symptoms worsen, and asked to  - Stay home and avoid contact with others until you get your results (4-5 days)  - Avoid travel on public transportation if possible (such as bus, train, or airplane) or o Sent to the Emergency Department by EMS for evaluation, COVID-19 testing, and possible admission depending on your condition and test results.  What to do if you are LOW RISK for COVID-19?  Reduce your risk of any infection by using the same precautions used for avoiding the common cold or flu:  Marland Kitchen Wash your hands often with soap and warm water for at least 20 seconds.  If soap and water are not readily available, use an alcohol-based hand sanitizer with at least 60% alcohol.  Marland Kitchen  If coughing or sneezing, cover your mouth and nose by coughing or sneezing into the elbow areas of your shirt or coat, into a tissue or into your sleeve (not your hands). . Avoid shaking hands with others and consider head nods or verbal greetings only. . Avoid touching your eyes, nose, or mouth with unwashed hands.  . Avoid close contact with people who are sick. . Avoid places or events with large numbers of people in one location,  like concerts or sporting events. . Carefully consider travel plans you have or are making. . If you are planning any travel outside or inside the [place], visit the CDC's Travelers' Health webpage for the latest health notices. . If you have some symptoms but not all symptoms, continue to monitor at home and seek medical attention if your symptoms worsen. . If you are having a medical emergency, call 911.   [place] / e-Visit: [email or web address]         [facility]: [facility]: [phone]                   [facility]: [phone]

## 2019-07-12 ENCOUNTER — Telehealth: Payer: Self-pay | Admitting: Hematology

## 2019-07-12 NOTE — Telephone Encounter (Signed)
No los per 7/29. °

## 2019-07-13 ENCOUNTER — Other Ambulatory Visit: Payer: Self-pay

## 2019-07-13 ENCOUNTER — Inpatient Hospital Stay: Payer: Medicare Other

## 2019-07-13 VITALS — BP 128/72 | HR 60 | Temp 98.2°F | Resp 17

## 2019-07-13 DIAGNOSIS — K648 Other hemorrhoids: Secondary | ICD-10-CM | POA: Diagnosis not present

## 2019-07-13 DIAGNOSIS — Z5111 Encounter for antineoplastic chemotherapy: Secondary | ICD-10-CM | POA: Diagnosis not present

## 2019-07-13 DIAGNOSIS — Z5112 Encounter for antineoplastic immunotherapy: Secondary | ICD-10-CM | POA: Diagnosis not present

## 2019-07-13 DIAGNOSIS — C786 Secondary malignant neoplasm of retroperitoneum and peritoneum: Secondary | ICD-10-CM | POA: Diagnosis not present

## 2019-07-13 DIAGNOSIS — C182 Malignant neoplasm of ascending colon: Secondary | ICD-10-CM

## 2019-07-13 DIAGNOSIS — Z452 Encounter for adjustment and management of vascular access device: Secondary | ICD-10-CM | POA: Diagnosis not present

## 2019-07-13 MED ORDER — HEPARIN SOD (PORK) LOCK FLUSH 100 UNIT/ML IV SOLN
500.0000 [IU] | Freq: Once | INTRAVENOUS | Status: AC | PRN
  Administered 2019-07-13: 500 [IU]
  Filled 2019-07-13: qty 5

## 2019-07-13 MED ORDER — SODIUM CHLORIDE 0.9% FLUSH
10.0000 mL | INTRAVENOUS | Status: DC | PRN
  Administered 2019-07-13: 14:00:00 10 mL
  Filled 2019-07-13: qty 10

## 2019-07-23 ENCOUNTER — Other Ambulatory Visit: Payer: Self-pay

## 2019-07-23 ENCOUNTER — Encounter (HOSPITAL_COMMUNITY): Payer: Self-pay

## 2019-07-23 ENCOUNTER — Ambulatory Visit (HOSPITAL_COMMUNITY)
Admission: RE | Admit: 2019-07-23 | Discharge: 2019-07-23 | Disposition: A | Discharge status: Home / Self Care | Payer: Medicare Other | Source: Ambulatory Visit | Attending: Hematology | Admitting: Hematology

## 2019-07-23 DIAGNOSIS — C182 Malignant neoplasm of ascending colon: Secondary | ICD-10-CM

## 2019-07-23 DIAGNOSIS — C189 Malignant neoplasm of colon, unspecified: Secondary | ICD-10-CM | POA: Diagnosis not present

## 2019-07-23 HISTORY — DX: Malignant (primary) neoplasm, unspecified: C80.1

## 2019-07-23 MED ORDER — SODIUM CHLORIDE (PF) 0.9 % IJ SOLN
INTRAMUSCULAR | Status: AC
  Filled 2019-07-23: qty 50

## 2019-07-23 MED ORDER — IOHEXOL 300 MG/ML  SOLN
100.0000 mL | Freq: Once | INTRAMUSCULAR | Status: AC | PRN
  Administered 2019-07-23: 100 mL via INTRAVENOUS

## 2019-07-23 NOTE — Progress Notes (Signed)
Nathaniel Norton   [phone] [phone]   Clinic Follow up Note   Patient Care Team: Lajean Manes, MD as PCP - General (Internal Medicine) Berle Mull, MD as Consulting Physician (Family Medicine) Clarene Essex, MD as Consulting Physician (Gastroenterology)  Date of Service:  07/25/2019  CHIEF COMPLAINT: F/u ofmetastaticcolon cancer  SUMMARY OF ONCOLOGIC HISTORY: Oncology History Overview Note  Cancer Staging Cancer of right colon [facility]) Staging form: Colon and Rectum, AJCC 8th Edition - Clinical stage from 04/09/2019: Stage IVC (cTX, cNX, pM1c) - Signed by Truitt Merle, MD on 05/03/2019     Cancer of right colon [facility])  04/09/2019 Procedure   Colonoscopy 04/09/19 by Dr Watt Climes IMPRESSION -internal hemorrhoids -Diverticulosis in the sigmoid colon  -2 small polyps in the rectum and in the proximal transverse colon, removed with a hot snare. Resected and retrieved.  -3 medium polyps in the proximal transverse colon, in the mid transverse colon and in the distal transverse colon, removed and resected and retrieved.  -likely malignant partially obstructing tumor at the hepatic flexure. biopsied, tattooed.  -1 large polyp in the mid ascending colon  -the examination was otherwise normal     04/09/2019 Initial Biopsy   FINAL MICROSCOPIC DIAGNOSIS: 04/09/19 1. LG intestine-hepatic flexure, Biopsy:   INVASIVE WELL DIFFERENTIATED ADENOCARCINOMA   04/09/2019 Cancer Staging   Staging form: Colon and Rectum, AJCC 8th Edition - Clinical stage from 04/09/2019: Stage IVC (cTX, cNX, pM1c) - Signed by Truitt Merle, MD on 05/03/2019   04/10/2019 Imaging   CT AP 04/10/19  IMPRESSION: 1. There is an eccentric mass of the colon involving the ascending colon near the hepatic flexure measuring approximately 3.4 x 3.4 by 2.0 cm (series 2, image 37, series 3, image 37). There is extensive soft tissue nodularity of the mesocolon and omentum, and likely areas of the  peritoneum, for example bilateral upper quadrants (series 2, image 25). Findings are consistent with primary colon malignancy, probable omental and peritoneal involvement, and small volume malignant ascites. 2.  Other chronic and incidental findings as detailed above.   04/20/2019 Initial Diagnosis   Cancer of right colon ([place])   04/26/2019 Imaging   CT Chest 04/26/19 IMPRESSION: 1. Borderline to mild lower thoracic adenopathy, including within the right internal mammary and juxta cardiophrenic stations. Given the appearance of the upper abdomen, suspicious for nodal metastasis. 2. A low right paratracheal node is borderline sized, but favored to be reactive. 3. No evidence of pulmonary metastasis. 4. Peritoneal metastasis and abdominal ascites, as before. 5. Pancreatic parenchymal calcifications indicative of chronic calcific pancreatitis. 6. Coronary artery atherosclerosis.   04/27/2019 Pathology Results   Diagnosis 04/27/19 Peritoneum, biopsy, right upper quadrant, perihepatic - ADENOCARCINOMA, CONSISTENT WITH COLONIC PRIMARY. - SEE COMMENT.   05/16/2019 -  Chemotherapy   First line FOLFOX every 2 weeks with Avastin starting with cycle 2 starting 05/16/19   07/23/2019 Imaging    CT AP W Contrast  IMPRESSION: 1. Primary ascending colon mass may be minimally smaller. Peritoneal metastatic disease appears slightly improved as well. 2. Chronic calcific pancreatitis. 3. Tiny left renal stone. 4. Small left inguinal hernia contains a knuckle of unobstructed colon. 5. Enlarged prostate.      CURRENT THERAPY:  First lineFOLFOX every 2 weeks with Avastin starting with cycle 2starting 05/16/19  INTERVAL HISTORY:  Nathaniel Norton is here for a follow up and treatment. He presents to the clinic alone. He notes he is doing well. He notes he was working on a tree and  had gotten bruising of his left forearm. He has no open wound. He denies any new changes with chemo. He still losses  appetite which will last for 4-5 days. He wonders his chance of complete response with HIPAC surgery. He will think about consultations with Dr.  Elvin So. He notes he tried Protonix and has ran out. He did not notice much difference.    REVIEW OF SYSTEMS:   Constitutional: Denies fevers, chills or abnormal weight loss Eyes: Denies blurriness of vision Ears, nose, mouth, throat, and face: Denies mucositis or sore throat Respiratory: Denies cough, dyspnea or wheezes Cardiovascular: Denies palpitation, chest discomfort or lower extremity swelling Gastrointestinal:  Denies nausea, heartburn or change in bowel habits Skin: Denies abnormal skin rashes (+) Moderate bruise of left forearm Lymphatics: Denies new lymphadenopathy or easy bruising Neurological:Denies numbness, tingling or new weaknesses Behavioral/Psych: Mood is stable, no new changes  All other systems were reviewed with the patient and are negative.  MEDICAL HISTORY:  Past Medical History:  Diagnosis Date  . colon ca dx'd 03/2019    SURGICAL HISTORY: Past Surgical History:  Procedure Laterality Date  . CATARACT EXTRACTION    . IR IMAGING GUIDED PORT INSERTION  05/10/2019  . L4 fracture  2012  . RETINAL DETACHMENT SURGERY    . Right hand surgery  1974    I have reviewed the social history and family history with the patient and they are unchanged from previous note.  ALLERGIES:  is allergic to feraheme [ferumoxytol] and codeine.  MEDICATIONS:  Current Outpatient Medications  Medication Sig Dispense Refill  . ferrous sulfate 325 (65 FE) MG tablet Take 325 mg by mouth daily with breakfast.    . lidocaine-prilocaine (EMLA) cream Apply to affected area once 30 g 3  . Multiple Vitamin (MULTIVITAMIN) tablet Take 1 tablet by mouth daily.    . ondansetron (ZOFRAN) 8 MG tablet Take 1 tablet (8 mg total) by mouth 2 (two) times daily as needed for refractory nausea / vomiting. Start on day 3 after chemotherapy. 30 tablet 1  .  prochlorperazine (COMPAZINE) 10 MG tablet Take 1 tablet (10 mg total) by mouth every 6 (six) hours as needed (Nausea or vomiting). 30 tablet 1   No current facility-administered medications for this visit.    Facility-Administered Medications Ordered in Other Visits  Medication Dose Route Frequency Provider Last Rate Last Dose  . bevacizumab (AVASTIN) 375 mg in sodium chloride 0.9 % 100 mL chemo infusion  5 mg/kg (Treatment Plan Recorded) Intravenous Once Truitt Merle, MD      . fluorouracil (ADRUCIL) 4,550 mg in sodium chloride 0.9 % 59 mL chemo infusion  2,400 mg/m2 (Treatment Plan Recorded) Intravenous 1 day or 1 dose Truitt Merle, MD      . heparin lock flush 100 unit/mL  500 Units Intracatheter Once PRN Truitt Merle, MD      . leucovorin 756 mg in dextrose 5 % 250 mL infusion  400 mg/m2 (Treatment Plan Recorded) Intravenous Once Truitt Merle, MD 144 mL/hr at 07/25/19 1328 756 mg at 07/25/19 1328  . oxaliplatin (ELOXATIN) 130 mg in dextrose 5 % 500 mL chemo infusion  70 mg/m2 (Treatment Plan Recorded) Intravenous Once Truitt Merle, MD 263 mL/hr at 07/25/19 1326 130 mg at 07/25/19 1326  . sodium chloride flush (NS) 0.9 % injection 10 mL  10 mL Intracatheter PRN Truitt Merle, MD        PHYSICAL EXAMINATION: ECOG PERFORMANCE STATUS: 1 - Symptomatic but completely ambulatory  Vitals:  07/25/19 1053  BP: (!) 143/84  Pulse: 62  Resp: 18  Temp: 97.8 F (36.6 C)  SpO2: 99%   Filed Weights   07/25/19 1053  Weight: 161 lb 1.6 oz (73.1 kg)    GENERAL:alert, no distress and comfortable SKIN: skin color, texture, turgor are normal, no rashes or significant lesions EYES: normal, Conjunctiva are pink and non-injected, sclera clear  NECK: supple, thyroid normal size, non-tender, without nodularity LYMPH:  no palpable lymphadenopathy in the cervical, axillary  LUNGS: clear to auscultation and percussion with normal breathing effort HEART: regular rate & rhythm and no murmurs and no lower extremity edema  ABDOMEN:abdomen soft, non-tender and normal bowel sounds Musculoskeletal:no cyanosis of digits and no clubbing  NEURO: alert & oriented x 3 with fluent speech, no focal motor/sensory deficits  LABORATORY DATA:  I have reviewed the data as listed CBC Latest Ref Rng & Units 07/25/2019 07/11/2019 06/27/2019  WBC 4.0 - 10.5 K/uL 3.9(L) 3.5(L) 3.6(L)  Hemoglobin 13.0 - 17.0 g/dL 13.2 12.7(L) 12.1(L)  Hematocrit 39.0 - 52.0 % 41.3 40.9 38.6(L)  Platelets 150 - 400 K/uL 101(L) 108(L) 109(L)     CMP Latest Ref Rng & Units 07/25/2019 07/11/2019 06/27/2019  Glucose 70 - 99 mg/dL 85 103(H) 90  BUN 8 - 23 mg/dL '19 13 11  ' Creatinine 0.61 - 1.24 mg/dL 0.97 0.90 0.86  Sodium 135 - 145 mmol/L 140 140 141  Potassium 3.5 - 5.1 mmol/L 4.5 4.6 3.9  Chloride 98 - 111 mmol/L 105 106 107  CO2 22 - 32 mmol/L '25 27 26  ' Calcium 8.9 - 10.3 mg/dL 9.9 9.6 9.0  Total Protein 6.5 - 8.1 g/dL 7.1 6.8 6.7  Total Bilirubin 0.3 - 1.2 mg/dL 0.7 1.0 0.8  Alkaline Phos 38 - 126 U/L 67 61 60  AST 15 - 41 U/L '19 17 17  ' ALT 0 - 44 U/L '21 16 16      ' RADIOGRAPHIC STUDIES: I have personally reviewed the radiological images as listed and agreed with the findings in the report. No results found.   ASSESSMENT & PLAN:  Nathaniel Norton is a 68 y.o. male with   1. Cancer of right colon,with peritoneal metastasis,stage IV,MMR normal -He wasrecentlydiagnosed in 03/2019. His Colonoscopy biopsy showed adenocarcinoma of right hepatic flexurecolon. -His peritoneal biopsy from 04/27/19 showsadenocarcinoma,consistent withmetastasis of his colon cancer. This is now stage IV disease which is no longer curable but still treatable.  -Ireviewed his FO results which shows MSI-Stable, KRASmutation (+). He is not eligible for immunotherapy at this time nor EGFR inhibitor. He will continue with biological agent Avastin. There are possible clinical trail options for later treatments. -He has startedfirst line FOLFOX  q2weekson6/3/20, Avastin added with cycle 2.He is tolerating mostly well with temporary loss of appetite and controlled mild nausea. Constipation improved with chemo dose reduction. Will watch for neuropathy. -I personally reviewed and discussed his CT AP from 07/23/19 which shows primary colon is smaller and peritoneal mets appear slightly improved. There is also finding of enlarged prostate. Given good response, will continue current treatment.  -I discussed given risk of neuropathy may switch his chemo to maintenance therapy at some point if he continues to have good response.  -I also discussed he may be a candidate for HIPAC surgery. If he is interested, I recommend he see specialist Dr. Elvin So at [facility] for more information. He will think about it.  -Labs reviewed, CBC and CMP WNL except WBC 3.9, PLT 101K. CEA and Iron panel is still  pending. Overall adequate to proceed withcycle6 FOLFOX today at same dose.  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  -GI workup with coloscopy by Dr. Watt Climes showed internal hemorrhoids, benign polyps, diverticulosis and colon cancer.  -He started on OTC multivitamin and oral iron in late 03/2019 and has been having black stool. This is likely from oral iron. -He has been treated with1doses of IV Feraheme on 05/03/19 and 05/16/19. He did have reaction to 2nd infusionand infusion was stopped. He hadmoderateresponse.  -Anemia resolved today. Iron panel still pending. (07/25/19). If he needs more IV iron in the future, I may given another type of iron.  3. Elevated PSA  -PSA was 5.7 in 11/2018 and 4.6 in 01/2019 -He plans to wait on seeing Urologist for work up given colon cancer diagnosis. Thisis understandable.   4. Social Support -He is single with no children and no family that [place]. -He lives alone in 1 [address] [employer]. -He does not have Internet access at home -He does have a cell phone which he can be reached. -If he needs help with transportation for treatment, I discussed our social workers can help him.  -I encouraged him to find someone to help him as needed at home.  5.Goal of care discussion  -The patient understands the goal of care is palliative. -he is full code now  6. Taste change, lower appetite  -Secondary to chemo -He lost 5 poundsafter first cycle chemobut was able to gain it back, weight stable lately -He will continue Ensure as needed and I encouraged him to eat more of what he can keep down.  -Will monitor. I will set up dietician consult -With smaller frequent meals he is able to maintain weight. He is able to recover during week 2. Stable.   7. Constipation  -secondary to chemo -Controlled with prophylactic Miralax, but after 2 episodes of emesis after taking it. He switched to Doculax -Improved with chemo dose reduction. Stable and controlled now.  -He did try Protonix for acid reflux, but did not feel much difference.   PLAN: -CT AP reviewed with patient. Scan shows partial response.  -Labs reviewed and adequate to proceed withC6FOLFOX and Avastinat same dose -Lab, flush, f/u with NP Lacie and chemo FOLFOX andAvastinin 2 weeks -He will think about referral to [facility] Clovis Riley at [facility]   No problem-specific [place] notes found for this encounter.   No orders of the defined types were placed in this encounter.  All questions were answered. The patient knows to call the clinic with any problems, questions or concerns. No barriers to learning was detected. I spent 20 minutes counseling the patient face to face. The total time spent in the appointment was 25 minutes and more than 50% was on counseling and review of test results     Truitt Merle, MD 07/25/2019   I, Joslyn Devon, am acting as scribe for Truitt Merle, MD.   I have reviewed the  above documentation for accuracy and completeness, and I agree with the above.

## 2019-07-25 ENCOUNTER — Inpatient Hospital Stay: Payer: Medicare Other

## 2019-07-25 ENCOUNTER — Telehealth: Payer: Self-pay | Admitting: Hematology

## 2019-07-25 ENCOUNTER — Encounter: Payer: Self-pay | Admitting: Hematology

## 2019-07-25 ENCOUNTER — Inpatient Hospital Stay: Payer: Medicare Other | Attending: Hematology

## 2019-07-25 ENCOUNTER — Other Ambulatory Visit: Payer: Self-pay

## 2019-07-25 ENCOUNTER — Inpatient Hospital Stay (HOSPITAL_BASED_OUTPATIENT_CLINIC_OR_DEPARTMENT_OTHER): Payer: Medicare Other | Admitting: Hematology

## 2019-07-25 VITALS — BP 143/84 | HR 62 | Temp 97.8°F | Resp 18 | Ht 66.0 in | Wt 161.1 lb

## 2019-07-25 DIAGNOSIS — K4090 Unilateral inguinal hernia, without obstruction or gangrene, not specified as recurrent: Secondary | ICD-10-CM | POA: Insufficient documentation

## 2019-07-25 DIAGNOSIS — C786 Secondary malignant neoplasm of retroperitoneum and peritoneum: Secondary | ICD-10-CM | POA: Insufficient documentation

## 2019-07-25 DIAGNOSIS — D696 Thrombocytopenia, unspecified: Secondary | ICD-10-CM | POA: Diagnosis not present

## 2019-07-25 DIAGNOSIS — D500 Iron deficiency anemia secondary to blood loss (chronic): Secondary | ICD-10-CM | POA: Diagnosis not present

## 2019-07-25 DIAGNOSIS — C182 Malignant neoplasm of ascending colon: Secondary | ICD-10-CM

## 2019-07-25 DIAGNOSIS — Z5112 Encounter for antineoplastic immunotherapy: Secondary | ICD-10-CM | POA: Diagnosis not present

## 2019-07-25 DIAGNOSIS — Z79899 Other long term (current) drug therapy: Secondary | ICD-10-CM | POA: Diagnosis not present

## 2019-07-25 DIAGNOSIS — Z5111 Encounter for antineoplastic chemotherapy: Secondary | ICD-10-CM | POA: Insufficient documentation

## 2019-07-25 DIAGNOSIS — N400 Enlarged prostate without lower urinary tract symptoms: Secondary | ICD-10-CM | POA: Diagnosis not present

## 2019-07-25 DIAGNOSIS — Z95828 Presence of other vascular implants and grafts: Secondary | ICD-10-CM

## 2019-07-25 LAB — CMP (CANCER CENTER ONLY)
ALT: 21 U/L (ref 0–44)
AST: 19 U/L (ref 15–41)
Albumin: 4.1 g/dL (ref 3.5–5.0)
Alkaline Phosphatase: 67 U/L (ref 38–126)
Anion gap: 10 (ref 5–15)
BUN: 19 mg/dL (ref 8–23)
CO2: 25 mmol/L (ref 22–32)
Calcium: 9.9 mg/dL (ref 8.9–10.3)
Chloride: 105 mmol/L (ref 98–111)
Creatinine: 0.97 mg/dL (ref 0.61–1.24)
GFR, Est AFR Am: >60 mL/min (ref >60)
GFR, Estimated: >60 mL/min (ref >60)
Glucose, Bld: 85 mg/dL (ref 70–99)
Potassium: 4.5 mmol/L (ref 3.5–5.1)
Sodium: 140 mmol/L (ref 135–145)
Total Bilirubin: 0.7 mg/dL (ref 0.3–1.2)
Total Protein: 7.1 g/dL (ref 6.5–8.1)

## 2019-07-25 LAB — CBC WITH DIFFERENTIAL (CANCER CENTER ONLY)
Abs Immature Granulocytes: 0 10*3/uL (ref 0.00–0.07)
Basophils Absolute: 0 10*3/uL (ref 0.0–0.1)
Basophils Relative: 0 %
Eosinophils Absolute: 0 10*3/uL (ref 0.0–0.5)
Eosinophils Relative: 1 %
HCT: 41.3 % (ref 39.0–52.0)
Hemoglobin: 13.2 g/dL (ref 13.0–17.0)
Immature Granulocytes: 0 %
Lymphocytes Relative: 32 %
Lymphs Abs: 1.2 10*3/uL (ref 0.7–4.0)
MCH: 24.9 pg — ABNORMAL LOW (ref 26.0–34.0)
MCHC: 32 g/dL (ref 30.0–36.0)
MCV: 77.8 fL — ABNORMAL LOW (ref 80.0–100.0)
Monocytes Absolute: 0.5 10*3/uL (ref 0.1–1.0)
Monocytes Relative: 13 %
Neutro Abs: 2.1 10*3/uL (ref 1.7–7.7)
Neutrophils Relative %: 54 %
Platelet Count: 101 10*3/uL — ABNORMAL LOW (ref 150–400)
RBC: 5.31 MIL/uL (ref 4.22–5.81)
RDW: 21 % — ABNORMAL HIGH (ref 11.5–15.5)
WBC Count: 3.9 10*3/uL — ABNORMAL LOW (ref 4.0–10.5)
nRBC: 0 % (ref 0.0–0.2)

## 2019-07-25 LAB — IRON AND TIBC
Iron: 302 ug/dL — ABNORMAL HIGH (ref 42–163)
Saturation Ratios: 104 % — ABNORMAL HIGH (ref 20–55)
TIBC: 290 ug/dL (ref 202–409)
UIBC: UNDETERMINED ug/dL (ref 117–376)

## 2019-07-25 LAB — CEA (IN HOUSE-CHCC): CEA (CHCC-In House): 3.18 ng/mL (ref 0.00–5.00)

## 2019-07-25 LAB — FERRITIN: Ferritin: 73 ng/mL (ref 24–336)

## 2019-07-25 LAB — TOTAL PROTEIN, URINE DIPSTICK: Protein, ur: NEGATIVE mg/dL

## 2019-07-25 MED ORDER — OXALIPLATIN CHEMO INJECTION 100 MG/20ML
70.0000 mg/m2 | Freq: Once | INTRAVENOUS | Status: AC
  Administered 2019-07-25: 130 mg via INTRAVENOUS
  Filled 2019-07-25: qty 20

## 2019-07-25 MED ORDER — PALONOSETRON HCL INJECTION 0.25 MG/5ML
0.2500 mg | Freq: Once | INTRAVENOUS | Status: AC
  Administered 2019-07-25: 0.25 mg via INTRAVENOUS

## 2019-07-25 MED ORDER — SODIUM CHLORIDE 0.9 % IV SOLN
2400.0000 mg/m2 | INTRAVENOUS | Status: DC
  Administered 2019-07-25: 4550 mg via INTRAVENOUS
  Filled 2019-07-25: qty 91

## 2019-07-25 MED ORDER — DEXTROSE 5 % IV SOLN
Freq: Once | INTRAVENOUS | Status: AC
  Administered 2019-07-25: 13:00:00 via INTRAVENOUS
  Filled 2019-07-25: qty 250

## 2019-07-25 MED ORDER — DEXAMETHASONE SODIUM PHOSPHATE 10 MG/ML IJ SOLN
10.0000 mg | Freq: Once | INTRAMUSCULAR | Status: AC
  Administered 2019-07-25: 10 mg via INTRAVENOUS

## 2019-07-25 MED ORDER — PALONOSETRON HCL INJECTION 0.25 MG/5ML
INTRAVENOUS | Status: AC
  Filled 2019-07-25: qty 5

## 2019-07-25 MED ORDER — SODIUM CHLORIDE 0.9 % IV SOLN
5.2000 mg/kg | Freq: Once | INTRAVENOUS | Status: AC
  Administered 2019-07-25: 400 mg via INTRAVENOUS
  Filled 2019-07-25: qty 16

## 2019-07-25 MED ORDER — LEUCOVORIN CALCIUM INJECTION 350 MG
400.0000 mg/m2 | Freq: Once | INTRAVENOUS | Status: AC
  Administered 2019-07-25: 756 mg via INTRAVENOUS
  Filled 2019-07-25: qty 37.8

## 2019-07-25 MED ORDER — DEXAMETHASONE SODIUM PHOSPHATE 10 MG/ML IJ SOLN
INTRAMUSCULAR | Status: AC
  Filled 2019-07-25: qty 1

## 2019-07-25 MED ORDER — SODIUM CHLORIDE 0.9 % IV SOLN
INTRAVENOUS | Status: DC
  Administered 2019-07-25: 16:00:00 via INTRAVENOUS
  Filled 2019-07-25: qty 250

## 2019-07-25 MED ORDER — SODIUM CHLORIDE 0.9% FLUSH
10.0000 mL | INTRAVENOUS | Status: DC | PRN
  Filled 2019-07-25: qty 10

## 2019-07-25 MED ORDER — SODIUM CHLORIDE 0.9% FLUSH
10.0000 mL | INTRAVENOUS | Status: DC | PRN
  Administered 2019-07-25: 10 mL
  Filled 2019-07-25: qty 10

## 2019-07-25 MED ORDER — HEPARIN SOD (PORK) LOCK FLUSH 100 UNIT/ML IV SOLN
500.0000 [IU] | Freq: Once | INTRAVENOUS | Status: DC | PRN
  Filled 2019-07-25: qty 5

## 2019-07-25 NOTE — Patient Instructions (Signed)
[place] Discharge Instructions for Patients Receiving Chemotherapy  Today you received the following chemotherapy agents Bevacizumab (AVASTIN), Oxaliplatin (ELOXATIN), Leucovorin & Flourouracil (ADRUCIL).  To help prevent nausea and vomiting after your treatment, we encourage you to take your nausea medication as prescribed.   If you develop nausea and vomiting that is not controlled by your nausea medication, call the clinic.   BELOW ARE SYMPTOMS THAT SHOULD BE REPORTED IMMEDIATELY:  *FEVER GREATER THAN 100.5 F  *CHILLS WITH OR WITHOUT FEVER  NAUSEA AND VOMITING THAT IS NOT CONTROLLED WITH YOUR NAUSEA MEDICATION  *UNUSUAL SHORTNESS OF BREATH  *UNUSUAL BRUISING OR BLEEDING  TENDERNESS IN MOUTH AND THROAT WITH OR WITHOUT PRESENCE OF ULCERS  *URINARY PROBLEMS  *BOWEL PROBLEMS  UNUSUAL RASH Items with * indicate a potential emergency and should be followed up as soon as possible.  Feel free to call the clinic should you have any questions or concerns. The clinic phone number is [phone].  Please show the [place] at check-in to the Emergency Department and triage nurse.  Coronavirus (COVID-19) Are you at risk?  Are you at risk for the Coronavirus (COVID-19)?  To be considered HIGH RISK for Coronavirus (COVID-19), you have to meet the following criteria:  . Traveled to [place], [place], [place], [place] or [place]; or in the [place] to [place], [place], [place], or [place]; and have fever, cough, and shortness of breath within the last 2 weeks of travel OR . Been in close contact with a person diagnosed with COVID-19 within the last 2 weeks and have fever, cough, and shortness of breath . IF YOU DO NOT MEET THESE CRITERIA, YOU ARE CONSIDERED LOW RISK FOR COVID-19.  What to do if you are HIGH RISK for COVID-19?  Marland Kitchen If you are having a medical emergency, call 911. . Seek medical care right away. Before you go to a doctor's office,  urgent care or emergency department, call ahead and tell them about your recent travel, contact with someone diagnosed with COVID-19, and your symptoms. You should receive instructions from your physician's office regarding next steps of care.  . When you arrive at healthcare provider, tell the healthcare staff immediately you have returned from visiting [place], [place], [place], [place] or [place]; or traveled in the [place] to [place], [place], [place], or [place]; in the last two weeks or you have been in close contact with a person diagnosed with COVID-19 in the last 2 weeks.   . Tell the health care staff about your symptoms: fever, cough and shortness of breath. . After you have been seen by a medical provider, you will be either: o Tested for (COVID-19) and discharged home on quarantine except to seek medical care if symptoms worsen, and asked to  - Stay home and avoid contact with others until you get your results (4-5 days)  - Avoid travel on public transportation if possible (such as bus, train, or airplane) or o Sent to the Emergency Department by EMS for evaluation, COVID-19 testing, and possible admission depending on your condition and test results.  What to do if you are LOW RISK for COVID-19?  Reduce your risk of any infection by using the same precautions used for avoiding the common cold or flu:  Marland Kitchen Wash your hands often with soap and warm water for at least 20 seconds.  If soap and water are not readily available, use an alcohol-based hand sanitizer with at least 60%  alcohol.  . If coughing or sneezing, cover your mouth and nose by coughing or sneezing into the elbow areas of your shirt or coat, into a tissue or into your sleeve (not your hands). . Avoid shaking hands with others and consider head nods or verbal greetings only. . Avoid touching your eyes, nose, or mouth with unwashed hands.  . Avoid close contact with people who are sick. . Avoid places or events with  large numbers of people in one location, like concerts or sporting events. . Carefully consider travel plans you have or are making. . If you are planning any travel outside or inside the [place], visit the CDC's Travelers' Health webpage for the latest health notices. . If you have some symptoms but not all symptoms, continue to monitor at home and seek medical attention if your symptoms worsen. . If you are having a medical emergency, call 911.   [place] / e-Visit: [email or web address]         [facility]: [facility]: [phone]                   [facility]: [phone]

## 2019-07-25 NOTE — Telephone Encounter (Signed)
Scheduled appt per 8/12 los. °

## 2019-07-25 NOTE — Progress Notes (Signed)
Verbal order per Dr. Burr Medico: okay to start patient's Folfox treatment while we await urine sample for urine dipstick lab for his Avastin. Pharmacy notified.

## 2019-07-27 ENCOUNTER — Other Ambulatory Visit: Payer: Self-pay

## 2019-07-27 ENCOUNTER — Inpatient Hospital Stay: Payer: Medicare Other

## 2019-07-27 VITALS — BP 128/72 | HR 62 | Temp 98.2°F | Resp 18

## 2019-07-27 DIAGNOSIS — K4090 Unilateral inguinal hernia, without obstruction or gangrene, not specified as recurrent: Secondary | ICD-10-CM | POA: Diagnosis not present

## 2019-07-27 DIAGNOSIS — N400 Enlarged prostate without lower urinary tract symptoms: Secondary | ICD-10-CM | POA: Diagnosis not present

## 2019-07-27 DIAGNOSIS — C182 Malignant neoplasm of ascending colon: Secondary | ICD-10-CM | POA: Diagnosis not present

## 2019-07-27 DIAGNOSIS — Z5111 Encounter for antineoplastic chemotherapy: Secondary | ICD-10-CM | POA: Diagnosis not present

## 2019-07-27 DIAGNOSIS — C786 Secondary malignant neoplasm of retroperitoneum and peritoneum: Secondary | ICD-10-CM | POA: Diagnosis not present

## 2019-07-27 DIAGNOSIS — Z5112 Encounter for antineoplastic immunotherapy: Secondary | ICD-10-CM | POA: Diagnosis not present

## 2019-07-27 MED ORDER — HEPARIN SOD (PORK) LOCK FLUSH 100 UNIT/ML IV SOLN
500.0000 [IU] | Freq: Once | INTRAVENOUS | Status: AC | PRN
  Administered 2019-07-27: 500 [IU]
  Filled 2019-07-27: qty 5

## 2019-07-27 MED ORDER — SODIUM CHLORIDE 0.9% FLUSH
10.0000 mL | INTRAVENOUS | Status: DC | PRN
  Administered 2019-07-27: 10 mL
  Filled 2019-07-27: qty 10

## 2019-08-07 NOTE — Progress Notes (Signed)
[name]   Telephone:(336) [phone] Fax:([phone]   Clinic Follow up Note   Patient Care Team: Lajean Manes, MD as PCP - General (Internal Medicine) Berle Mull, MD as Consulting Physician (Family Medicine) Clarene Essex, MD as Consulting Physician (Gastroenterology) 08/08/2019  CHIEF COMPLAINT: Follow-up metastatic colon cancer  SUMMARY OF ONCOLOGIC HISTORY: Oncology History Overview Note  Cancer Staging Cancer of right colon [facility]) Staging form: Colon and Rectum, AJCC 8th Edition - Clinical stage from 04/09/2019: Stage IVC (cTX, cNX, pM1c) - Signed by Truitt Merle, MD on 05/03/2019     Cancer of right colon [facility])  04/09/2019 Procedure   Colonoscopy 04/09/19 by Dr Watt Climes IMPRESSION -internal hemorrhoids -Diverticulosis in the sigmoid colon  -2 small polyps in the rectum and in the proximal transverse colon, removed with a hot snare. Resected and retrieved.  -3 medium polyps in the proximal transverse colon, in the mid transverse colon and in the distal transverse colon, removed and resected and retrieved.  -likely malignant partially obstructing tumor at the hepatic flexure. biopsied, tattooed.  -1 large polyp in the mid ascending colon  -the examination was otherwise normal     04/09/2019 Initial Biopsy   FINAL MICROSCOPIC DIAGNOSIS: 04/09/19 1. LG intestine-hepatic flexure, Biopsy:   INVASIVE WELL DIFFERENTIATED ADENOCARCINOMA   04/09/2019 Cancer Staging   Staging form: Colon and Rectum, AJCC 8th Edition - Clinical stage from 04/09/2019: Stage IVC (cTX, cNX, pM1c) - Signed by Truitt Merle, MD on 05/03/2019   04/10/2019 Imaging   CT AP 04/10/19  IMPRESSION: 1. There is an eccentric mass of the colon involving the ascending colon near the hepatic flexure measuring approximately 3.4 x 3.4 by 2.0 cm (series 2, image 37, series 3, image 37). There is extensive soft tissue nodularity of the mesocolon and omentum, and likely areas of the peritoneum, for example  bilateral upper quadrants (series 2, image 25). Findings are consistent with primary colon malignancy, probable omental and peritoneal involvement, and small volume malignant ascites. 2.  Other chronic and incidental findings as detailed above.   04/20/2019 Initial Diagnosis   Cancer of right colon ([place])   04/26/2019 Imaging   CT Chest 04/26/19 IMPRESSION: 1. Borderline to mild lower thoracic adenopathy, including within the right internal mammary and juxta cardiophrenic stations. Given the appearance of the upper abdomen, suspicious for nodal metastasis. 2. A low right paratracheal node is borderline sized, but favored to be reactive. 3. No evidence of pulmonary metastasis. 4. Peritoneal metastasis and abdominal ascites, as before. 5. Pancreatic parenchymal calcifications indicative of chronic calcific pancreatitis. 6. Coronary artery atherosclerosis.   04/27/2019 Pathology Results   Diagnosis 04/27/19 Peritoneum, biopsy, right upper quadrant, perihepatic - ADENOCARCINOMA, CONSISTENT WITH COLONIC PRIMARY. - SEE COMMENT.   05/16/2019 -  Chemotherapy   First line FOLFOX every 2 weeks with Avastin starting with cycle 2 starting 05/16/19   07/23/2019 Imaging    CT AP W Contrast  IMPRESSION: 1. Primary ascending colon mass may be minimally smaller. Peritoneal metastatic disease appears slightly improved as well. 2. Chronic calcific pancreatitis. 3. Tiny left renal stone. 4. Small left inguinal hernia contains a knuckle of unobstructed colon. 5. Enlarged prostate.     CURRENT THERAPY: First lineFOLFOX every 2 weeks with Avastin starting with cycle 2starting 05/16/19  INTERVAL HISTORY: Mr. Heslop returns for follow-up and treatment as scheduled.  Completed cycle 6 FOLFOX and Avastin on 07/25/2019. He notes some side effects are lasting longer with each chemo such as cold sensitivity and decreased taste. Denies mucositis.  Constipation is stable, usually has BM every 1-2 days. Denies  bleeding. No significant nausea. Denies bleeding. Denies cough, chest pain, dyspnea, leg edema. He has early signs of neuropathy with intermittent tingling in fingertips only. He has a stye which is irritating to him, he gets these periodically.    MEDICAL HISTORY:  Past Medical History:  Diagnosis Date  . colon ca dx'd 03/2019    SURGICAL HISTORY: Past Surgical History:  Procedure Laterality Date  . CATARACT EXTRACTION    . IR IMAGING GUIDED PORT INSERTION  05/10/2019  . L4 fracture  2012  . RETINAL DETACHMENT SURGERY    . Right hand surgery  1974    I have reviewed the social history and family history with the patient and they are unchanged from previous note.  ALLERGIES:  is allergic to feraheme [ferumoxytol] and codeine.  MEDICATIONS:  Current Outpatient Medications  Medication Sig Dispense Refill  . ferrous sulfate 325 (65 FE) MG tablet Take 325 mg by mouth daily with breakfast.    . lidocaine-prilocaine (EMLA) cream Apply to affected area once 30 g 3  . Multiple Vitamin (MULTIVITAMIN) tablet Take 1 tablet by mouth daily.    . ondansetron (ZOFRAN) 8 MG tablet Take 1 tablet (8 mg total) by mouth 2 (two) times daily as needed for refractory nausea / vomiting. Start on day 3 after chemotherapy. (Patient not taking: Reported on 08/08/2019) 30 tablet 1  . prochlorperazine (COMPAZINE) 10 MG tablet Take 1 tablet (10 mg total) by mouth every 6 (six) hours as needed (Nausea or vomiting). (Patient not taking: Reported on 08/08/2019) 30 tablet 1   No current facility-administered medications for this visit.    Facility-Administered Medications Ordered in Other Visits  Medication Dose Route Frequency Provider Last Rate Last Dose  . 0.9 %  sodium chloride infusion   Intravenous Continuous Truitt Merle, MD   Stopped at 08/08/19 1201  . fluorouracil (ADRUCIL) 4,150 mg in sodium chloride 0.9 % 67 mL chemo infusion  2,200 mg/m2 (Treatment Plan Recorded) Intravenous 1 day or 1 dose Truitt Merle, MD    4,150 mg at 08/08/19 1413    PHYSICAL EXAMINATION: ECOG PERFORMANCE STATUS: 1 - Symptomatic but completely ambulatory  Vitals:   08/08/19 0928  BP: 132/68  Pulse: (!) 57  Resp: 18  Temp: 98.2 F (36.8 C)  SpO2: 100%   Filed Weights   08/08/19 0928  Weight: 158 lb 14.4 oz (72.1 kg)    GENERAL:alert, no distress and comfortable SKIN: no rash  EYES: sclera clear. Right lower lid stye with mild erythema  OROPHARYNX: no thrush or ulcers LUNGS: clear to auscultation with normal breathing effort HEART: regular rate & rhythm, no lower extremity edema ABDOMEN:abdomen soft, non-tender and normal bowel sounds Musculoskeletal:no cyanosis of digits NEURO: alert & oriented x 3 with fluent speech, no focal motor/sensory deficits. Normal gait PAC without erythema   LABORATORY DATA:  I have reviewed the data as listed CBC Latest Ref Rng & Units 08/08/2019 07/25/2019 07/11/2019  WBC 4.0 - 10.5 K/uL 3.7(L) 3.9(L) 3.5(L)  Hemoglobin 13.0 - 17.0 g/dL 13.8 13.2 12.7(L)  Hematocrit 39.0 - 52.0 % 42.6 41.3 40.9  Platelets 150 - 400 K/uL 91(L) 101(L) 108(L)     CMP Latest Ref Rng & Units 08/08/2019 07/25/2019 07/11/2019  Glucose 70 - 99 mg/dL 104(H) 85 103(H)  BUN 8 - 23 mg/dL '19 19 13  ' Creatinine 0.61 - 1.24 mg/dL 1.06 0.97 0.90  Sodium 135 - 145 mmol/L 143 140 140  Potassium 3.5 - 5.1 mmol/L 4.4 4.5 4.6  Chloride 98 - 111 mmol/L 108 105 106  CO2 22 - 32 mmol/L '26 25 27  ' Calcium 8.9 - 10.3 mg/dL 9.7 9.9 9.6  Total Protein 6.5 - 8.1 g/dL 7.1 7.1 6.8  Total Bilirubin 0.3 - 1.2 mg/dL 0.9 0.7 1.0  Alkaline Phos 38 - 126 U/L 81 67 61  AST 15 - 41 U/L '30 19 17  ' ALT 0 - 44 U/L 35 21 16      RADIOGRAPHIC STUDIES: I have personally reviewed the radiological images as listed and agreed with the findings in the report. No results found.   ASSESSMENT & PLAN: Nathaniel Norton a 68 y.o.malewith   1. Cancer of right colon,with peritoneal metastasis,stage IV,MMR normal -He  wasrecentlydiagnosed in 03/2019. His Colonoscopy biopsy showed adenocarcinoma of right hepatic flexurecolon.Peritoneal biopsy from 04/27/19 showsadenocarcinoma,consistent withmetastasis of his colon cancer, stage IV disease   -tumor is MMR normal, he is not a candidate forimmunotherapy.FO result shows MSI-Stable, KRASmutation (+). He is not eligible for immunotherapy at this time nor EGFR inhibitor. He will continue with biological agent Avastin. -S/p 6 cycles of FOLFOX and avastin added from cycle 2 -Mr. Pettingill appears stable. He completed 6 cycles of FOLFOX and continues Avastin. He tolerates treatment moderately well with decreased taste and constipation that he manages without medication. He has early signs of peripheral neuropathy. No functional difficulties. Will monitor closely  -Labs reviewed, PLT 91K, CBC otherwise stable. CMP stable.  -He will proceed with cycle 7 FOLFOX and avastin today. Will reduce 5FU for thrombocytopenia. I discussed with Dr. Burr Medico -return in 2 weeks for f/u and next cycle   2. 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  -GI workup with coloscopy by Dr. Watt Climes showed internal hemorrhoids, benign polyps, diverticulosis and colon cancer.  -He was started on OTC multivitamin and oral iron in late 03/2019 and has been having intermittent black stool likely related to iron supplement  -s/p 1doses of IV Feraheme on 05/03/19 and 05/16/19. He did have reaction to 2nd infusionand infusion was stopped.   -previously reported intermittent black tarry stools since chemo when he was very constipated, BM has returned to normal consistency and color. I started PPI with  pantoprazole.  -iron study normal on 07/25/2019  4. Elevated PSA  -PSA was 5.7 in 11/2018 and 4.6 in 01/2019 -urology f/u deferred due to metastatic colon cancer   5. Social Support -limited social support  6.Goal of care discussion -full code -he understands the treatment goal is palliative; will continue as long as he can tolerate and it controls his disease   7. Taste change, lower appetite  -Secondary to chemo -followed by dietician -worsening on chemotherapy  -weight is slightly decreased  PLAN: -Labs reviewed -OK to treat with thrombocytopenia -Early neurotoxicity with mild neuropathy, fingertips only, continue monitoring  -Proceed with cycle 7 FOLFOX and avastin, dose reduce 5FU for thrombocytopenia -F/u in 2 weeks with next cycle  All questions were answered. The patient knows to call the clinic with any problems, questions or concerns. No barriers to learning was detected. I spent 20 minutes counseling the patient face to face. The total time spent in the appointment was 25 minutes and more than 50%  was on counseling and review of test results     Alla Feeling, NP 08/08/19

## 2019-08-08 ENCOUNTER — Inpatient Hospital Stay: Payer: Medicare Other

## 2019-08-08 ENCOUNTER — Encounter: Payer: Self-pay | Admitting: Nurse Practitioner

## 2019-08-08 ENCOUNTER — Inpatient Hospital Stay (HOSPITAL_BASED_OUTPATIENT_CLINIC_OR_DEPARTMENT_OTHER): Payer: Medicare Other | Admitting: Nurse Practitioner

## 2019-08-08 ENCOUNTER — Other Ambulatory Visit: Payer: Self-pay

## 2019-08-08 VITALS — BP 132/68 | HR 57 | Temp 98.2°F | Resp 18 | Ht 66.0 in | Wt 158.9 lb

## 2019-08-08 DIAGNOSIS — Z95828 Presence of other vascular implants and grafts: Secondary | ICD-10-CM

## 2019-08-08 DIAGNOSIS — K4090 Unilateral inguinal hernia, without obstruction or gangrene, not specified as recurrent: Secondary | ICD-10-CM | POA: Diagnosis not present

## 2019-08-08 DIAGNOSIS — C182 Malignant neoplasm of ascending colon: Secondary | ICD-10-CM

## 2019-08-08 DIAGNOSIS — D500 Iron deficiency anemia secondary to blood loss (chronic): Secondary | ICD-10-CM

## 2019-08-08 DIAGNOSIS — Z5112 Encounter for antineoplastic immunotherapy: Secondary | ICD-10-CM | POA: Diagnosis not present

## 2019-08-08 DIAGNOSIS — Z5111 Encounter for antineoplastic chemotherapy: Secondary | ICD-10-CM | POA: Diagnosis not present

## 2019-08-08 DIAGNOSIS — C786 Secondary malignant neoplasm of retroperitoneum and peritoneum: Secondary | ICD-10-CM | POA: Diagnosis not present

## 2019-08-08 DIAGNOSIS — N400 Enlarged prostate without lower urinary tract symptoms: Secondary | ICD-10-CM | POA: Diagnosis not present

## 2019-08-08 LAB — CBC WITH DIFFERENTIAL (CANCER CENTER ONLY)
Abs Immature Granulocytes: 0.01 10*3/uL (ref 0.00–0.07)
Basophils Absolute: 0 10*3/uL (ref 0.0–0.1)
Basophils Relative: 1 %
Eosinophils Absolute: 0 10*3/uL (ref 0.0–0.5)
Eosinophils Relative: 1 %
HCT: 42.6 % (ref 39.0–52.0)
Hemoglobin: 13.8 g/dL (ref 13.0–17.0)
Immature Granulocytes: 0 %
Lymphocytes Relative: 31 %
Lymphs Abs: 1.1 10*3/uL (ref 0.7–4.0)
MCH: 25.3 pg — ABNORMAL LOW (ref 26.0–34.0)
MCHC: 32.4 g/dL (ref 30.0–36.0)
MCV: 78.2 fL — ABNORMAL LOW (ref 80.0–100.0)
Monocytes Absolute: 0.5 10*3/uL (ref 0.1–1.0)
Monocytes Relative: 14 %
Neutro Abs: 2 10*3/uL (ref 1.7–7.7)
Neutrophils Relative %: 53 %
Platelet Count: 91 10*3/uL — ABNORMAL LOW (ref 150–400)
RBC: 5.45 MIL/uL (ref 4.22–5.81)
RDW: 21 % — ABNORMAL HIGH (ref 11.5–15.5)
WBC Count: 3.7 10*3/uL — ABNORMAL LOW (ref 4.0–10.5)
nRBC: 0 % (ref 0.0–0.2)

## 2019-08-08 LAB — CMP (CANCER CENTER ONLY)
ALT: 35 U/L (ref 0–44)
AST: 30 U/L (ref 15–41)
Albumin: 4 g/dL (ref 3.5–5.0)
Alkaline Phosphatase: 81 U/L (ref 38–126)
Anion gap: 9 (ref 5–15)
BUN: 19 mg/dL (ref 8–23)
CO2: 26 mmol/L (ref 22–32)
Calcium: 9.7 mg/dL (ref 8.9–10.3)
Chloride: 108 mmol/L (ref 98–111)
Creatinine: 1.06 mg/dL (ref 0.61–1.24)
GFR, Est AFR Am: >60 mL/min (ref >60)
GFR, Estimated: >60 mL/min (ref >60)
Glucose, Bld: 104 mg/dL — ABNORMAL HIGH (ref 70–99)
Potassium: 4.4 mmol/L (ref 3.5–5.1)
Sodium: 143 mmol/L (ref 135–145)
Total Bilirubin: 0.9 mg/dL (ref 0.3–1.2)
Total Protein: 7.1 g/dL (ref 6.5–8.1)

## 2019-08-08 MED ORDER — PALONOSETRON HCL INJECTION 0.25 MG/5ML
INTRAVENOUS | Status: AC
  Filled 2019-08-08: qty 5

## 2019-08-08 MED ORDER — LEUCOVORIN CALCIUM INJECTION 350 MG
400.0000 mg/m2 | Freq: Once | INTRAVENOUS | Status: AC
  Administered 2019-08-08: 756 mg via INTRAVENOUS
  Filled 2019-08-08: qty 37.8

## 2019-08-08 MED ORDER — DEXAMETHASONE SODIUM PHOSPHATE 10 MG/ML IJ SOLN
INTRAMUSCULAR | Status: AC
  Filled 2019-08-08: qty 1

## 2019-08-08 MED ORDER — OXALIPLATIN CHEMO INJECTION 100 MG/20ML
70.0000 mg/m2 | Freq: Once | INTRAVENOUS | Status: AC
  Administered 2019-08-08: 130 mg via INTRAVENOUS
  Filled 2019-08-08: qty 10

## 2019-08-08 MED ORDER — SODIUM CHLORIDE 0.9 % IV SOLN
2200.0000 mg/m2 | INTRAVENOUS | Status: DC
  Administered 2019-08-08: 4150 mg via INTRAVENOUS
  Filled 2019-08-08: qty 83

## 2019-08-08 MED ORDER — SODIUM CHLORIDE 0.9 % IV SOLN
INTRAVENOUS | Status: DC
  Administered 2019-08-08: 11:00:00 via INTRAVENOUS
  Filled 2019-08-08: qty 250

## 2019-08-08 MED ORDER — PALONOSETRON HCL INJECTION 0.25 MG/5ML
0.2500 mg | Freq: Once | INTRAVENOUS | Status: AC
  Administered 2019-08-08: 0.25 mg via INTRAVENOUS

## 2019-08-08 MED ORDER — DEXTROSE 5 % IV SOLN
Freq: Once | INTRAVENOUS | Status: AC
  Administered 2019-08-08: 12:00:00 via INTRAVENOUS
  Filled 2019-08-08: qty 250

## 2019-08-08 MED ORDER — DEXAMETHASONE SODIUM PHOSPHATE 10 MG/ML IJ SOLN
10.0000 mg | Freq: Once | INTRAMUSCULAR | Status: AC
  Administered 2019-08-08: 10 mg via INTRAVENOUS

## 2019-08-08 MED ORDER — SODIUM CHLORIDE 0.9 % IV SOLN
5.2000 mg/kg | Freq: Once | INTRAVENOUS | Status: AC
  Administered 2019-08-08: 400 mg via INTRAVENOUS
  Filled 2019-08-08: qty 16

## 2019-08-08 MED ORDER — SODIUM CHLORIDE 0.9% FLUSH
10.0000 mL | INTRAVENOUS | Status: DC | PRN
  Administered 2019-08-08: 10 mL
  Filled 2019-08-08: qty 10

## 2019-08-10 ENCOUNTER — Inpatient Hospital Stay: Payer: Medicare Other

## 2019-08-10 ENCOUNTER — Other Ambulatory Visit: Payer: Self-pay

## 2019-08-10 VITALS — BP 132/75 | HR 63 | Temp 98.3°F | Resp 18

## 2019-08-10 DIAGNOSIS — Z5111 Encounter for antineoplastic chemotherapy: Secondary | ICD-10-CM | POA: Diagnosis not present

## 2019-08-10 DIAGNOSIS — Z5112 Encounter for antineoplastic immunotherapy: Secondary | ICD-10-CM | POA: Diagnosis not present

## 2019-08-10 DIAGNOSIS — C786 Secondary malignant neoplasm of retroperitoneum and peritoneum: Secondary | ICD-10-CM | POA: Diagnosis not present

## 2019-08-10 DIAGNOSIS — N400 Enlarged prostate without lower urinary tract symptoms: Secondary | ICD-10-CM | POA: Diagnosis not present

## 2019-08-10 DIAGNOSIS — C182 Malignant neoplasm of ascending colon: Secondary | ICD-10-CM | POA: Diagnosis not present

## 2019-08-10 DIAGNOSIS — K4090 Unilateral inguinal hernia, without obstruction or gangrene, not specified as recurrent: Secondary | ICD-10-CM | POA: Diagnosis not present

## 2019-08-10 MED ORDER — HEPARIN SOD (PORK) LOCK FLUSH 100 UNIT/ML IV SOLN
500.0000 [IU] | Freq: Once | INTRAVENOUS | Status: AC | PRN
  Administered 2019-08-10: 500 [IU]
  Filled 2019-08-10: qty 5

## 2019-08-10 MED ORDER — SODIUM CHLORIDE 0.9% FLUSH
10.0000 mL | INTRAVENOUS | Status: DC | PRN
  Administered 2019-08-10: 10 mL
  Filled 2019-08-10: qty 10

## 2019-08-21 NOTE — Progress Notes (Signed)
[place]   Telephone:(336) [phone] Fax:(336) [phone]   Clinic Follow up Note   Patient Care Team: Nathaniel Manes, MD as PCP - General (Internal Medicine) Nathaniel Mull, MD as Consulting Physician (Family Medicine) Nathaniel Essex, MD as Consulting Physician (Gastroenterology) 08/22/2019  CHIEF COMPLAINT: f/u metastatic colon cancer  SUMMARY OF ONCOLOGIC HISTORY: Oncology History Overview Note  Cancer Staging Cancer of right colon [facility]) Staging form: Colon and Rectum, AJCC 8th Edition - Clinical stage from 04/09/2019: Stage IVC (cTX, cNX, pM1c) - Signed by Nathaniel Merle, MD on 05/03/2019     Cancer of right colon [facility])  04/09/2019 Procedure   Colonoscopy 04/09/19 by Dr Nathaniel Norton IMPRESSION -internal hemorrhoids -Diverticulosis in the sigmoid colon  -2 small polyps in the rectum and in the proximal transverse colon, removed with a hot snare. Resected and retrieved.  -3 medium polyps in the proximal transverse colon, in the mid transverse colon and in the distal transverse colon, removed and resected and retrieved.  -likely malignant partially obstructing tumor at the hepatic flexure. biopsied, tattooed.  -1 large polyp in the mid ascending colon  -the examination was otherwise normal     04/09/2019 Initial Biopsy   FINAL MICROSCOPIC DIAGNOSIS: 04/09/19 1. LG intestine-hepatic flexure, Biopsy:   INVASIVE WELL DIFFERENTIATED ADENOCARCINOMA   04/09/2019 Cancer Staging   Staging form: Colon and Rectum, AJCC 8th Edition - Clinical stage from 04/09/2019: Stage IVC (cTX, cNX, pM1c) - Signed by Nathaniel Merle, MD on 05/03/2019   04/10/2019 Imaging   CT AP 04/10/19  IMPRESSION: 1. There is an eccentric mass of the colon involving the ascending colon near the hepatic flexure measuring approximately 3.4 x 3.4 by 2.0 cm (series 2, image 37, series 3, image 37). There is extensive soft tissue nodularity of the mesocolon and omentum, and likely areas of the peritoneum, for example bilateral  upper quadrants (series 2, image 25). Findings are consistent with primary colon malignancy, probable omental and peritoneal involvement, and small volume malignant ascites. 2.  Other chronic and incidental findings as detailed above.   04/20/2019 Initial Diagnosis   Cancer of right colon ([place])   04/26/2019 Imaging   CT Chest 04/26/19 IMPRESSION: 1. Borderline to mild lower thoracic adenopathy, including within the right internal mammary and juxta cardiophrenic stations. Given the appearance of the upper abdomen, suspicious for nodal metastasis. 2. A low right paratracheal node is borderline sized, but favored to be reactive. 3. No evidence of pulmonary metastasis. 4. Peritoneal metastasis and abdominal ascites, as before. 5. Pancreatic parenchymal calcifications indicative of chronic calcific pancreatitis. 6. Coronary artery atherosclerosis.   04/27/2019 Pathology Results   Diagnosis 04/27/19 Peritoneum, biopsy, right upper quadrant, perihepatic - ADENOCARCINOMA, CONSISTENT WITH COLONIC PRIMARY. - SEE COMMENT.   05/16/2019 -  Chemotherapy   First line FOLFOX every 2 weeks with Avastin starting with cycle 2 starting 05/16/19   07/23/2019 Imaging    CT AP W Contrast  IMPRESSION: 1. Primary ascending colon mass may be minimally smaller. Peritoneal metastatic disease appears slightly improved as well. 2. Chronic calcific pancreatitis. 3. Tiny left renal stone. 4. Small left inguinal hernia contains a knuckle of unobstructed colon. 5. Enlarged prostate.     CURRENT THERAPY: First lineFOLFOX every 2 weeks with Avastin starting with cycle 2starting 05/16/19  INTERVAL HISTORY: Nathaniel Norton returns for f/u and treatment as scheduled. He completed cycle 7 on 08/08/19. It takes longer each cycle for taste to return, now lasting over 1 week. Warmer foods are easier to eat. Oral cold  sensitivity also lasts a week but cold sensitivity in his hands never goes away between chemo. He has  constant tingling in fingertips only, no numbness. Can button clothes fine. Denies mouth sores. Has BM every few days. Mild nausea on days 3-5 is well controlled. No blood in stool. He was off iron for 3-4 days while he ran out. He has had 2 mild nosebleeds since last chemo, lasting a couple minutes and resolved with pressure. Has mild aches and pains but no abdominal pain. Denies fever, chills, cough, chest pain, or leg edema. Has mild DOE which is stable for him.    MEDICAL HISTORY:  Past Medical History:  Diagnosis Date   colon ca dx'd 03/2019    SURGICAL HISTORY: Past Surgical History:  Procedure Laterality Date   CATARACT EXTRACTION     IR IMAGING GUIDED PORT INSERTION  05/10/2019   L4 fracture  2012   RETINAL DETACHMENT SURGERY     Right hand surgery  1974    I have reviewed the social history and family history with the patient and they are unchanged from previous note.  ALLERGIES:  is allergic to feraheme [ferumoxytol] and codeine.  MEDICATIONS:  Current Outpatient Medications  Medication Sig Dispense Refill   ferrous sulfate 325 (65 FE) MG tablet Take 325 mg by mouth daily with breakfast.     lidocaine-prilocaine (EMLA) cream Apply to affected area once 30 g 3   Multiple Vitamin (MULTIVITAMIN) tablet Take 1 tablet by mouth daily.     ondansetron (ZOFRAN) 8 MG tablet Take 1 tablet (8 mg total) by mouth 2 (two) times daily as needed for refractory nausea / vomiting. Start on day 3 after chemotherapy. 30 tablet 1   prochlorperazine (COMPAZINE) 10 MG tablet Take 1 tablet (10 mg total) by mouth every 6 (six) hours as needed (Nausea or vomiting). 30 tablet 1   No current facility-administered medications for this visit.    Facility-Administered Medications Ordered in Other Visits  Medication Dose Route Frequency Provider Last Rate Last Dose   0.9 %  sodium chloride infusion   Intravenous Continuous Nathaniel Merle, MD   Stopped at 08/22/19 1423   fluorouracil (ADRUCIL)  4,150 mg in sodium chloride 0.9 % 67 mL chemo infusion  2,200 mg/m2 (Treatment Plan Recorded) Intravenous 1 day or 1 dose Nathaniel Merle, MD   4,150 mg at 08/22/19 1427   heparin lock flush 100 unit/mL  500 Units Intracatheter Once PRN Nathaniel Merle, MD       sodium chloride flush (NS) 0.9 % injection 10 mL  10 mL Intracatheter PRN Nathaniel Merle, MD        PHYSICAL EXAMINATION: ECOG PERFORMANCE STATUS: 1 - Symptomatic but completely ambulatory  Vitals:   08/22/19 1022  BP: 140/79  Pulse: 62  Resp: 17  Temp: 98.3 F (36.8 C)  SpO2: 99%   Filed Weights   08/22/19 1022  Weight: 160 lb 11.2 oz (72.9 kg)    GENERAL:alert, no distress and comfortable SKIN: no rash  EYES:  sclera clear OROPHARYNX: no thrush or ulcers LUNGS: clear to auscultation with normal breathing effort HEART: regular rate & rhythm, no lower extremity edema ABDOMEN: abdomen soft, non-tender and normal bowel sounds Musculoskeletal:no cyanosis of digits  NEURO: alert & oriented x 3 with fluent speech, no peripheral sensory deficit per tuning fork exam. Normal gait PAC without erythema   LABORATORY DATA:  I have reviewed the data as listed CBC Latest Ref Rng & Units 08/22/2019 08/08/2019 07/25/2019  WBC  4.0 - 10.5 K/uL 3.3(L) 3.7(L) 3.9(L)  Hemoglobin 13.0 - 17.0 g/dL 13.1 13.8 13.2  Hematocrit 39.0 - 52.0 % 40.3 42.6 41.3  Platelets 150 - 400 K/uL 88(L) 91(L) 101(L)     CMP Latest Ref Rng & Units 08/22/2019 08/08/2019 07/25/2019  Glucose 70 - 99 mg/dL 86 104(H) 85  BUN 8 - 23 mg/dL _0 Creatinine 0.61 - 1.24 mg/dL 0.96 1.06 0.97  Sodium 135 - 145 mmol/L 142 143 140  Potassium 3.5 - 5.1 mmol/L 4.2 4.4 4.5  Chloride 98 - 111 mmol/L 107 108 105  CO2 22 - 32 mmol/L _1 Calcium 8.9 - 10.3 mg/dL 9.7 9.7 9.9  Total Protein 6.5 - 8.1 g/dL 6.7 7.1 7.1  Total Bilirubin 0.3 - 1.2 mg/dL 1.0 0.9 0.7  Alkaline Phos 38 - 126 U/L 83 81 67  AST 15 - 41 U/L _2 ALT 0 - 44 U/L 36 35 21      RADIOGRAPHIC  STUDIES: I have personally reviewed the radiological images as listed and agreed with the findings in the report. No results found.   ASSESSMENT & PLAN: Nathaniel Norton a 68 y.o.malewith   1. Cancer of right colon,with peritoneal metastasis,stage IV,MMR normal -He wasrecentlydiagnosed in 03/2019. His Colonoscopy biopsy showed adenocarcinoma of right hepatic flexurecolon.Peritoneal biopsy from 04/27/19 showsadenocarcinoma,consistent withmetastasis of his colon cancer, stage IV disease -tumor isMMRnormal, he is not a candidate forimmunotherapy.FO result shows MSI-Stable, KRASmutation (+). He is not eligible for immunotherapy at this time nor EGFR inhibitor. He will continue with biological agent Avastin. -S/p 7 cycles of FOLFOX and avastin added from cycle 2 -Mr. Maske appears stable. He completed 7 cycles of FOLFOX and continues Avastin. 5FU was dose reduced for thrombocytopenia. He had 2 episodes of epistaxis. He tolerates treatment moderately well with decreased taste and constipation that he manages without medication. He has progressive neuropathy and cold sensitivity, still no functional difficulties. Will monitor closely  -Labs reviewed, PLT 88K, CBC otherwise stable. CMP stable.  -He will proceed with treatment today; will hold Oxaliplatin due to worsening neurotoxicity and thrombocytopenia; proceed with 5FU, leucovorin, and Avastin only  -return in 2 weeks for f/u and next cycle   2. Peripheral neuropathy, secondary to Oxaliplatin -he developed mild intermittent tingling in fingertips after cycle 6 FOLFOX and progressed to constant tingling after cycle 7 -oxaliplatin was previously reduced for constipation -no functional difficulties or neuro deficits on exam -he has progressive cold sensitivity. Will hold oxaliplatin today with cycle 8 due to this and thrombocytopenia -if neuropathy and labs improve, will consider restarting dose-reduced Oxali with next cycle    3.Iron deficientAnemia due to chronic blood loss from colon cancer, SOB  -iron deficiency discovered in 03/2019 by PCP.  -GI workup with coloscopy by Dr. Watt Norton showed internal hemorrhoids, benign polyps, diverticulosis and colon cancer.  -He was started on OTC multivitamin and oral iron in late 03/2019 and has been havingintermittentblack stoollikely related to iron supplement -s/p1doses of IV Feraheme on 05/03/19 and 05/16/19. He did have reaction to 2nd infusionand it was stopped.  -previously reported intermittent black tarry stools since chemo when he was very constipated, BM has returned to normal consistency and color. Started PPI with pantoprazole.  -ferritin 113 today, normal; continue oral iron   4. Elevated PSA  -PSA was 5.7 in 11/2018 and 4.6 in 01/2019 -urology f/u deferred due to metastatic colon cancer  5. Social Support -limited social support  6.Goal of care discussion-full  code -he understands the treatment goal is palliative; will continue as long as he can tolerate and it controls his disease  7. Taste change, lower appetite  -Secondary to chemo -followed by dietician -worsening on chemotherapy  -weight is stable  PLAN: -Labs reviewed -Proceed with cycle 8 chemo with 5FU, leucovorin, and Avastin only; hold oxaliplatin due to neurotoxicity and thrombocytopenia -F/u and next cycle in 2 weeks; if thrombocytopenia and neuropathy improve, consider resuming dose-reduced oxaliplatin -I reviewed the plan with Dr. Burr Medico who amended the orders   All questions were answered. The patient knows to call the clinic with any problems, questions or concerns. No barriers to learning was detected. I spent 20 minutes counseling the patient face to face. The total time spent in the appointment was 25 minutes and more than 50% was on counseling and review of test results     Alla Feeling, NP 08/22/19

## 2019-08-22 ENCOUNTER — Other Ambulatory Visit: Payer: Self-pay

## 2019-08-22 ENCOUNTER — Inpatient Hospital Stay: Payer: Medicare Other | Attending: Hematology

## 2019-08-22 ENCOUNTER — Inpatient Hospital Stay: Payer: Medicare Other

## 2019-08-22 ENCOUNTER — Inpatient Hospital Stay (HOSPITAL_BASED_OUTPATIENT_CLINIC_OR_DEPARTMENT_OTHER): Payer: Medicare Other | Admitting: Nurse Practitioner

## 2019-08-22 ENCOUNTER — Encounter: Payer: Self-pay | Admitting: Nurse Practitioner

## 2019-08-22 VITALS — BP 140/79 | HR 62 | Temp 98.3°F | Resp 17 | Ht 66.0 in | Wt 160.7 lb

## 2019-08-22 VITALS — BP 156/84

## 2019-08-22 DIAGNOSIS — C182 Malignant neoplasm of ascending colon: Secondary | ICD-10-CM | POA: Diagnosis not present

## 2019-08-22 DIAGNOSIS — K5900 Constipation, unspecified: Secondary | ICD-10-CM | POA: Insufficient documentation

## 2019-08-22 DIAGNOSIS — G629 Polyneuropathy, unspecified: Secondary | ICD-10-CM | POA: Insufficient documentation

## 2019-08-22 DIAGNOSIS — D696 Thrombocytopenia, unspecified: Secondary | ICD-10-CM | POA: Diagnosis not present

## 2019-08-22 DIAGNOSIS — Z5111 Encounter for antineoplastic chemotherapy: Secondary | ICD-10-CM | POA: Insufficient documentation

## 2019-08-22 DIAGNOSIS — Z95828 Presence of other vascular implants and grafts: Secondary | ICD-10-CM

## 2019-08-22 DIAGNOSIS — D500 Iron deficiency anemia secondary to blood loss (chronic): Secondary | ICD-10-CM

## 2019-08-22 DIAGNOSIS — C786 Secondary malignant neoplasm of retroperitoneum and peritoneum: Secondary | ICD-10-CM | POA: Diagnosis not present

## 2019-08-22 DIAGNOSIS — Z5112 Encounter for antineoplastic immunotherapy: Secondary | ICD-10-CM | POA: Diagnosis present

## 2019-08-22 DIAGNOSIS — N400 Enlarged prostate without lower urinary tract symptoms: Secondary | ICD-10-CM | POA: Diagnosis not present

## 2019-08-22 DIAGNOSIS — Z79899 Other long term (current) drug therapy: Secondary | ICD-10-CM | POA: Diagnosis not present

## 2019-08-22 LAB — CMP (CANCER CENTER ONLY)
ALT: 36 U/L (ref 0–44)
AST: 30 U/L (ref 15–41)
Albumin: 4 g/dL (ref 3.5–5.0)
Alkaline Phosphatase: 83 U/L (ref 38–126)
Anion gap: 10 (ref 5–15)
BUN: 17 mg/dL (ref 8–23)
CO2: 25 mmol/L (ref 22–32)
Calcium: 9.7 mg/dL (ref 8.9–10.3)
Chloride: 107 mmol/L (ref 98–111)
Creatinine: 0.96 mg/dL (ref 0.61–1.24)
GFR, Est AFR Am: >60 mL/min (ref >60)
GFR, Estimated: >60 mL/min (ref >60)
Glucose, Bld: 86 mg/dL (ref 70–99)
Potassium: 4.2 mmol/L (ref 3.5–5.1)
Sodium: 142 mmol/L (ref 135–145)
Total Bilirubin: 1 mg/dL (ref 0.3–1.2)
Total Protein: 6.7 g/dL (ref 6.5–8.1)

## 2019-08-22 LAB — CBC WITH DIFFERENTIAL (CANCER CENTER ONLY)
Abs Immature Granulocytes: 0 10*3/uL (ref 0.00–0.07)
Basophils Absolute: 0 10*3/uL (ref 0.0–0.1)
Basophils Relative: 0 %
Eosinophils Absolute: 0 10*3/uL (ref 0.0–0.5)
Eosinophils Relative: 1 %
HCT: 40.3 % (ref 39.0–52.0)
Hemoglobin: 13.1 g/dL (ref 13.0–17.0)
Immature Granulocytes: 0 %
Lymphocytes Relative: 32 %
Lymphs Abs: 1.1 10*3/uL (ref 0.7–4.0)
MCH: 26.3 pg (ref 26.0–34.0)
MCHC: 32.5 g/dL (ref 30.0–36.0)
MCV: 80.8 fL (ref 80.0–100.0)
Monocytes Absolute: 0.5 10*3/uL (ref 0.1–1.0)
Monocytes Relative: 16 %
Neutro Abs: 1.7 10*3/uL (ref 1.7–7.7)
Neutrophils Relative %: 51 %
Platelet Count: 88 10*3/uL — ABNORMAL LOW (ref 150–400)
RBC: 4.99 MIL/uL (ref 4.22–5.81)
RDW: 20.9 % — ABNORMAL HIGH (ref 11.5–15.5)
WBC Count: 3.3 10*3/uL — ABNORMAL LOW (ref 4.0–10.5)
nRBC: 0 % (ref 0.0–0.2)

## 2019-08-22 LAB — CEA (IN HOUSE-CHCC): CEA (CHCC-In House): 1.77 ng/mL (ref 0.00–5.00)

## 2019-08-22 LAB — FERRITIN: Ferritin: 113 ng/mL (ref 24–336)

## 2019-08-22 MED ORDER — PALONOSETRON HCL INJECTION 0.25 MG/5ML
0.2500 mg | Freq: Once | INTRAVENOUS | Status: AC
  Administered 2019-08-22: 0.25 mg via INTRAVENOUS

## 2019-08-22 MED ORDER — SODIUM CHLORIDE 0.9 % IV SOLN
5.2000 mg/kg | Freq: Once | INTRAVENOUS | Status: AC
  Administered 2019-08-22: 400 mg via INTRAVENOUS
  Filled 2019-08-22: qty 16

## 2019-08-22 MED ORDER — PALONOSETRON HCL INJECTION 0.25 MG/5ML
INTRAVENOUS | Status: AC
  Filled 2019-08-22: qty 5

## 2019-08-22 MED ORDER — HEPARIN SOD (PORK) LOCK FLUSH 100 UNIT/ML IV SOLN
500.0000 [IU] | Freq: Once | INTRAVENOUS | Status: DC | PRN
  Filled 2019-08-22: qty 5

## 2019-08-22 MED ORDER — SODIUM CHLORIDE 0.9 % IV SOLN
2200.0000 mg/m2 | INTRAVENOUS | Status: DC
  Administered 2019-08-22: 14:00:00 4150 mg via INTRAVENOUS
  Filled 2019-08-22: qty 83

## 2019-08-22 MED ORDER — SODIUM CHLORIDE 0.9% FLUSH
10.0000 mL | INTRAVENOUS | Status: DC | PRN
  Administered 2019-08-22: 10:00:00 10 mL
  Filled 2019-08-22: qty 10

## 2019-08-22 MED ORDER — SODIUM CHLORIDE 0.9 % IV SOLN
INTRAVENOUS | Status: DC
  Administered 2019-08-22: 12:00:00 via INTRAVENOUS
  Filled 2019-08-22: qty 250

## 2019-08-22 MED ORDER — DEXAMETHASONE SODIUM PHOSPHATE 10 MG/ML IJ SOLN
INTRAMUSCULAR | Status: AC
  Filled 2019-08-22: qty 1

## 2019-08-22 MED ORDER — SODIUM CHLORIDE 0.9% FLUSH
10.0000 mL | INTRAVENOUS | Status: DC | PRN
  Filled 2019-08-22: qty 10

## 2019-08-22 MED ORDER — DEXAMETHASONE SODIUM PHOSPHATE 10 MG/ML IJ SOLN
10.0000 mg | Freq: Once | INTRAMUSCULAR | Status: AC
  Administered 2019-08-22: 10 mg via INTRAVENOUS

## 2019-08-22 MED ORDER — LEUCOVORIN CALCIUM INJECTION 350 MG
400.0000 mg/m2 | Freq: Once | INTRAVENOUS | Status: AC
  Administered 2019-08-22: 13:00:00 756 mg via INTRAVENOUS
  Filled 2019-08-22: qty 37.8

## 2019-08-22 NOTE — Progress Notes (Signed)
Per Nathaniel Rue NP. OK to tx w/ platelets 88. Oxaliplatin removed from treatment plan by Dr. Burr Medico

## 2019-08-22 NOTE — Patient Instructions (Signed)
[place] Discharge Instructions for Patients Receiving Chemotherapy  Today you received the following chemotherapy agents Bevacizumab (AVASTIN), Leucovorin & Flourouracil (ADRUCIL).  To help prevent nausea and vomiting after your treatment, we encourage you to take your nausea medication as prescribed.   If you develop nausea and vomiting that is not controlled by your nausea medication, call the clinic.   BELOW ARE SYMPTOMS THAT SHOULD BE REPORTED IMMEDIATELY:  *FEVER GREATER THAN 100.5 F  *CHILLS WITH OR WITHOUT FEVER  NAUSEA AND VOMITING THAT IS NOT CONTROLLED WITH YOUR NAUSEA MEDICATION  *UNUSUAL SHORTNESS OF BREATH  *UNUSUAL BRUISING OR BLEEDING  TENDERNESS IN MOUTH AND THROAT WITH OR WITHOUT PRESENCE OF ULCERS  *URINARY PROBLEMS  *BOWEL PROBLEMS  UNUSUAL RASH Items with * indicate a potential emergency and should be followed up as soon as possible.  Feel free to call the clinic should you have any questions or concerns. The clinic phone number is [phone].  Please show the [place] at check-in to the Emergency Department and triage nurse.

## 2019-08-23 ENCOUNTER — Telehealth: Payer: Self-pay | Admitting: Nurse Practitioner

## 2019-08-23 NOTE — Telephone Encounter (Signed)
No los per 9/9. °

## 2019-08-24 ENCOUNTER — Other Ambulatory Visit: Payer: Self-pay

## 2019-08-24 ENCOUNTER — Inpatient Hospital Stay: Payer: Medicare Other

## 2019-08-24 VITALS — BP 140/86 | HR 57 | Temp 98.5°F | Resp 18

## 2019-08-24 DIAGNOSIS — Z5112 Encounter for antineoplastic immunotherapy: Secondary | ICD-10-CM | POA: Diagnosis not present

## 2019-08-24 DIAGNOSIS — C182 Malignant neoplasm of ascending colon: Secondary | ICD-10-CM

## 2019-08-24 MED ORDER — SODIUM CHLORIDE 0.9% FLUSH
10.0000 mL | INTRAVENOUS | Status: DC | PRN
  Administered 2019-08-24: 10 mL
  Filled 2019-08-24: qty 10

## 2019-08-24 MED ORDER — HEPARIN SOD (PORK) LOCK FLUSH 100 UNIT/ML IV SOLN
500.0000 [IU] | Freq: Once | INTRAVENOUS | Status: AC | PRN
  Administered 2019-08-24: 500 [IU]
  Filled 2019-08-24: qty 5

## 2019-08-31 NOTE — Progress Notes (Signed)
[place]   [phone] Fax:(336) [phone]   Clinic Follow up Note   Patient Care Team: Lajean Manes, MD as PCP - General (Internal Medicine) Berle Mull, MD as Consulting Physician (Family Medicine) Clarene Essex, MD as Consulting Physician (Gastroenterology)  Date of Service:  09/05/2019  CHIEF COMPLAINT:  F/u ofmetastaticcolon cancer  SUMMARY OF ONCOLOGIC HISTORY: Oncology History Overview Note  Cancer Staging Cancer of right colon [facility]) Staging form: Colon and Rectum, AJCC 8th Edition - Clinical stage from 04/09/2019: Stage IVC (cTX, cNX, pM1c) - Signed by Truitt Merle, MD on 05/03/2019     Cancer of right colon [facility])  04/09/2019 Procedure   Colonoscopy 04/09/19 by Dr Watt Climes IMPRESSION -internal hemorrhoids -Diverticulosis in the sigmoid colon  -2 small polyps in the rectum and in the proximal transverse colon, removed with a hot snare. Resected and retrieved.  -3 medium polyps in the proximal transverse colon, in the mid transverse colon and in the distal transverse colon, removed and resected and retrieved.  -likely malignant partially obstructing tumor at the hepatic flexure. biopsied, tattooed.  -1 large polyp in the mid ascending colon  -the examination was otherwise normal     04/09/2019 Initial Biopsy   FINAL MICROSCOPIC DIAGNOSIS: 04/09/19 1. LG intestine-hepatic flexure, Biopsy:   INVASIVE WELL DIFFERENTIATED ADENOCARCINOMA   04/09/2019 Cancer Staging   Staging form: Colon and Rectum, AJCC 8th Edition - Clinical stage from 04/09/2019: Stage IVC (cTX, cNX, pM1c) - Signed by Truitt Merle, MD on 05/03/2019   04/10/2019 Imaging   CT AP 04/10/19  IMPRESSION: 1. There is an eccentric mass of the colon involving the ascending colon near the hepatic flexure measuring approximately 3.4 x 3.4 by 2.0 cm (series 2, image 37, series 3, image 37). There is extensive soft tissue nodularity of the mesocolon and omentum, and likely areas of the  peritoneum, for example bilateral upper quadrants (series 2, image 25). Findings are consistent with primary colon malignancy, probable omental and peritoneal involvement, and small volume malignant ascites. 2.  Other chronic and incidental findings as detailed above.   04/20/2019 Initial Diagnosis   Cancer of right colon ([place])   04/26/2019 Imaging   CT Chest 04/26/19 IMPRESSION: 1. Borderline to mild lower thoracic adenopathy, including within the right internal mammary and juxta cardiophrenic stations. Given the appearance of the upper abdomen, suspicious for nodal metastasis. 2. A low right paratracheal node is borderline sized, but favored to be reactive. 3. No evidence of pulmonary metastasis. 4. Peritoneal metastasis and abdominal ascites, as before. 5. Pancreatic parenchymal calcifications indicative of chronic calcific pancreatitis. 6. Coronary artery atherosclerosis.   04/27/2019 Pathology Results   Diagnosis 04/27/19 Peritoneum, biopsy, right upper quadrant, perihepatic - ADENOCARCINOMA, CONSISTENT WITH COLONIC PRIMARY. - SEE COMMENT.   05/16/2019 -  Chemotherapy   First line FOLFOX every 2 weeks with Avastin starting with cycle 2 starting 05/16/19   07/23/2019 Imaging    CT AP W Contrast  IMPRESSION: 1. Primary ascending colon mass may be minimally smaller. Peritoneal metastatic disease appears slightly improved as well. 2. Chronic calcific pancreatitis. 3. Tiny left renal stone. 4. Small left inguinal hernia contains a knuckle of unobstructed colon. 5. Enlarged prostate.      CURRENT THERAPY:  First lineFOLFOX every 2 weeks with Avastin starting with cycle 2starting 05/16/19. 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   INTERVAL HISTORY:  Ehan Freas is here for a follow up and treatment. He presents to the clinic alone.  He notes he is doing well. He notes recent bug bites with which seem different and itch. He  has not scratched. He notes Rosacea on his face intermittently. He also notes stye under right eye which he has had lately. He notes C8 without Oxaliplatin felt stable with little change in his loss of appetite. He still makes sure he eats. Weight is stable. He notes moderate cold sensitivity with C7. He notes now very mild tingling sensations occasionally S/p C8.   REVIEW OF SYSTEMS:   Constitutional: Denies fevers, chills or abnormal weight loss Eyes: Denies blurriness of vision (+) Stye under right eye  Ears, nose, mouth, throat, and face: Denies mucositis or sore throat Respiratory: Denies cough, dyspnea or wheezes Cardiovascular: Denies palpitation, chest discomfort or lower extremity swelling Gastrointestinal:  Denies nausea, heartburn or change in bowel habits Skin: Denies abnormal skin rashes (+) Bug bites of right upper arm and left upper shoulder (+) Rosacea of face intermittently  Lymphatics: Denies new lymphadenopathy or easy bruising Neurological: (+) Mild neuropathy of hands  Behavioral/Psych: Mood is stable, no new changes  All other systems were reviewed with the patient and are negative.  MEDICAL HISTORY:  Past Medical History:  Diagnosis Date  . colon ca dx'd 03/2019    SURGICAL HISTORY: Past Surgical History:  Procedure Laterality Date  . CATARACT EXTRACTION    . IR IMAGING GUIDED PORT INSERTION  05/10/2019  . L4 fracture  2012  . RETINAL DETACHMENT SURGERY    . Right hand surgery  1974    I have reviewed the social history and family history with the patient and they are unchanged from previous note.  ALLERGIES:  is allergic to feraheme [ferumoxytol] and codeine.  MEDICATIONS:  Current Outpatient Medications  Medication Sig Dispense Refill  . ferrous sulfate 325 (65 FE) MG tablet Take 325 mg by mouth daily with breakfast.    . lidocaine-prilocaine (EMLA) cream Apply to affected area once 30 g 3  . Multiple Vitamin (MULTIVITAMIN) tablet Take 1 tablet by mouth  daily.    . ondansetron (ZOFRAN) 8 MG tablet Take 1 tablet (8 mg total) by mouth 2 (two) times daily as needed for refractory nausea / vomiting. Start on day 3 after chemotherapy. (Patient not taking: Reported on 09/05/2019) 30 tablet 1  . prochlorperazine (COMPAZINE) 10 MG tablet Take 1 tablet (10 mg total) by mouth every 6 (six) hours as needed (Nausea or vomiting). (Patient not taking: Reported on 09/05/2019) 30 tablet 1   No current facility-administered medications for this visit.    Facility-Administered Medications Ordered in Other Visits  Medication Dose Route Frequency Provider Last Rate Last Dose  . bevacizumab (AVASTIN) 400 mg in sodium chloride 0.9 % 100 mL chemo infusion  5.2 mg/kg (Treatment Plan Recorded) Intravenous Once Truitt Merle, MD      . dextrose 5 % solution   Intravenous Once Truitt Merle, MD      . dextrose 5 % solution   Intravenous Once Truitt Merle, MD      . fluorouracil (ADRUCIL) 3,800 mg in sodium chloride 0.9 % 74 mL chemo infusion  2,000 mg/m2 (Treatment Plan Recorded) Intravenous 1 day or 1 dose Truitt Merle, MD      . leucovorin 756 mg in dextrose 5 % 250 mL infusion  400 mg/m2 (Treatment Plan Recorded) Intravenous Once Truitt Merle, MD      . oxaliplatin (ELOXATIN) 100 mg in dextrose 5 % 500 mL chemo infusion  52 mg/m2 (Treatment Plan Recorded) Intravenous  Once Truitt Merle, MD        PHYSICAL EXAMINATION: ECOG PERFORMANCE STATUS: 1 - Symptomatic but completely ambulatory  Vitals:   09/05/19 0910  BP: 136/80  Pulse: (!) 59  Resp: 18  Temp: 97.8 F (36.6 C)  SpO2: 100%   Filed Weights   09/05/19 0910  Weight: 160 lb 8 oz (72.8 kg)    GENERAL:alert, no distress and comfortable SKIN: skin color, texture, turgor are normal, no rashes or significant lesions (+) Rosacea of face, stye below right eye, bug bites of right upper arm and left upper shoulder with no sign of infection EYES: normal, Conjunctiva are pink and non-injected, sclera clear  NECK: supple, thyroid  normal size, non-tender, without nodularity LYMPH:  no palpable lymphadenopathy in the cervical, axillary  LUNGS: clear to auscultation and percussion with normal breathing effort HEART: regular rate & rhythm and no murmurs and no lower extremity edema ABDOMEN:abdomen soft, non-tender and normal bowel sounds Musculoskeletal:no cyanosis of digits and no clubbing  NEURO: alert & oriented x 3 with fluent speech, (+) Mild b/l motor/sensory deficits of hands  LABORATORY DATA:  I have reviewed the data as listed CBC Latest Ref Rng & Units 09/05/2019 08/22/2019 08/08/2019  WBC 4.0 - 10.5 K/uL 4.3 3.3(L) 3.7(L)  Hemoglobin 13.0 - 17.0 g/dL 13.8 13.1 13.8  Hematocrit 39.0 - 52.0 % 42.7 40.3 42.6  Platelets 150 - 400 K/uL 98(L) 88(L) 91(L)     CMP Latest Ref Rng & Units 09/05/2019 08/22/2019 08/08/2019  Glucose 70 - 99 mg/dL 95 86 104(H)  BUN 8 - 23 mg/dL _0 Creatinine 0.61 - 1.24 mg/dL 0.99 0.96 1.06  Sodium 135 - 145 mmol/L 142 142 143  Potassium 3.5 - 5.1 mmol/L 4.2 4.2 4.4  Chloride 98 - 111 mmol/L 107 107 108  CO2 22 - 32 mmol/L _1 Calcium 8.9 - 10.3 mg/dL 9.5 9.7 9.7  Total Protein 6.5 - 8.1 g/dL 6.9 6.7 7.1  Total Bilirubin 0.3 - 1.2 mg/dL 1.2 1.0 0.9  Alkaline Phos 38 - 126 U/L 94 83 81  AST 15 - 41 U/L 32 30 30  ALT 0 - 44 U/L 42 36 35      RADIOGRAPHIC STUDIES: I have personally reviewed the radiological images as listed and agreed with the findings in the report. No results found.   ASSESSMENT & PLAN:  Nathaniel Norton is a 68 y.o. male with    1. Cancer of right colon,with peritoneal metastasis,stage IV,MMR normal, KRAS mutation (+)  -He wasrecentlydiagnosed in 03/2019. His Colonoscopy biopsy showed adenocarcinoma of right hepatic flexurecolon. -His peritoneal biopsy from 04/27/19 showsadenocarcinoma,consistent withmetastasis of his colon cancer. This is now stage IV disease which is no longer curable but still treatable.  -Ireviewed his FO results  which shows MSI-Stable, KRASmutation (+). He is not eligible for immunotherapy at this time nor EGFR inhibitor. He will continue with biological agent Avastin.  -He has startedfirst line FOLFOX q2weekson6/3/20, Avastin added with cycle 2.He is tolerating mostly well with temporary loss of appetite and controlled mild nausea. Constipation improved with chemo dose reduction. S/p C7 he started to experience neuropathy. Which has improved after holding one dose. Will dose reduce Oxaliplatin starting with C9.  -Given risk of neuropathy may switch his chemo to maintenance therapy at some point if he continues to have good response.  -CT AP from 07/23/19 shows partial response, will continue current treatment.  -I discussed [place] surgery with him again. He is interested  and I will refer him to surgeon Dr. Clovis [facility] at [facility] to see if he is eligible. He is interested consultation for more information.  -Labs reviewed, CBC and CMP WNL except PLT 98K. Overall adequate to proceed with C9 FOLFOX and Avastin today with dose reduction.  -F/u in 2 weeks -I recommend flu vaccination, he agrees but by late October given he is on chemo. He understands.    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  -GI workup with coloscopy by Dr. Watt Climes showed internal hemorrhoids, benign polyps, diverticulosis and colon cancer.  -He started on OTC multivitamin and oral iron in late 03/2019 and has been having black stool. This is likely from oral iron. -He has been treated with1doses of IV Feraheme on 05/03/19 and 05/16/19. He did have reaction to 2nd infusionand infusion was stopped. -Anemia recently resolved. If he needs more IV iron in the future, I may given another type of iron.  3. Elevated PSA  -PSA was 5.7 in 11/2018 and 4.6 in 01/2019  -He plans to wait on seeing Urologist for work up given colon cancer diagnosis. Thisis understandable.   4.  Social Support -He is single with no children and no family that [place]. -He lives alone in 1 level [employer]. -He does not have Internet access at home -He does have a cell phone which he can be reached. -If he needs help with transportation for treatment, I discussed our social workers can help him.  -I encouraged him to find someone to help him as needed at home.  5.Goal of care discussion  -The patient understands the goal of care is palliative. -he is full code now  6. Taste change, lower appetite  -Secondary to chemo -He lost 5 poundsafter first cycle chemobut was able to gain it back. -He will continue Ensure as needed and I encouraged him to eat more of what he can keep down.  -Will monitor and f/u with dietician as needed.  -With smaller frequent meals he is able to maintain weight.Stable.   7. Constipation  -secondary to chemo -Controlled with prophylactic Miralax, but after 2 episodes of emesis after taking it. He switched to Doculax -Improved with chemo dose reduction. Stable and controlled now.  -He did try Protonix for acid reflux, but did not feel much difference.   8. Mild neuropathy  -S/p C7 FOLFOX, mainly in hands. Most cold related neuropathy  -Has mild sensory deficit of hands on exam today (09/05/19)  -Oxaliplatin dose reduced more than 50% starting with C9. Will monitor. May change to maintenance Xeloda in the near future.    PLAN: -Refer him to Dr. Clovis Riley for consultation  -Labs reviewed and adequate to proceed with C9 FOLFOX and Avastinat with dose reduction  -Lab, flush, f/u and chemo FOLFOX andAvastinin 2, 4, 6 and 8 weeks  No problem-specific Assessment & Plan notes found for this encounter.   No orders of the defined types were placed in this encounter.  All questions were answered. The patient knows to call the clinic with any problems, questions or concerns. No barriers to learning was detected. I spent  20 minutes counseling the patient face to face. The total time spent in the appointment was 25 minutes and more than 50% was on counseling and review of test results     Truitt Merle, MD 09/05/2019   I, Joslyn Devon, am acting as scribe for Truitt Merle, MD.  I have reviewed the above documentation for accuracy and completeness, and I agree with the above.       

## 2019-09-05 ENCOUNTER — Inpatient Hospital Stay (HOSPITAL_BASED_OUTPATIENT_CLINIC_OR_DEPARTMENT_OTHER): Payer: Medicare Other | Admitting: Hematology

## 2019-09-05 ENCOUNTER — Inpatient Hospital Stay: Payer: Medicare Other

## 2019-09-05 ENCOUNTER — Other Ambulatory Visit: Payer: Self-pay

## 2019-09-05 ENCOUNTER — Telehealth: Payer: Self-pay | Admitting: Hematology

## 2019-09-05 ENCOUNTER — Encounter: Payer: Self-pay | Admitting: Hematology

## 2019-09-05 VITALS — BP 136/80 | HR 59 | Temp 97.8°F | Resp 18 | Ht 66.0 in | Wt 160.5 lb

## 2019-09-05 VITALS — BP 155/80

## 2019-09-05 DIAGNOSIS — Z95828 Presence of other vascular implants and grafts: Secondary | ICD-10-CM

## 2019-09-05 DIAGNOSIS — C182 Malignant neoplasm of ascending colon: Secondary | ICD-10-CM | POA: Diagnosis not present

## 2019-09-05 DIAGNOSIS — D500 Iron deficiency anemia secondary to blood loss (chronic): Secondary | ICD-10-CM

## 2019-09-05 DIAGNOSIS — Z5112 Encounter for antineoplastic immunotherapy: Secondary | ICD-10-CM | POA: Diagnosis not present

## 2019-09-05 LAB — CMP (CANCER CENTER ONLY)
ALT: 42 U/L (ref 0–44)
AST: 32 U/L (ref 15–41)
Albumin: 4 g/dL (ref 3.5–5.0)
Alkaline Phosphatase: 94 U/L (ref 38–126)
Anion gap: 9 (ref 5–15)
BUN: 13 mg/dL (ref 8–23)
CO2: 26 mmol/L (ref 22–32)
Calcium: 9.5 mg/dL (ref 8.9–10.3)
Chloride: 107 mmol/L (ref 98–111)
Creatinine: 0.99 mg/dL (ref 0.61–1.24)
GFR, Est AFR Am: >60 mL/min (ref >60)
GFR, Estimated: >60 mL/min (ref >60)
Glucose, Bld: 95 mg/dL (ref 70–99)
Potassium: 4.2 mmol/L (ref 3.5–5.1)
Sodium: 142 mmol/L (ref 135–145)
Total Bilirubin: 1.2 mg/dL (ref 0.3–1.2)
Total Protein: 6.9 g/dL (ref 6.5–8.1)

## 2019-09-05 LAB — CBC WITH DIFFERENTIAL (CANCER CENTER ONLY)
Abs Immature Granulocytes: 0.01 10*3/uL (ref 0.00–0.07)
Basophils Absolute: 0 10*3/uL (ref 0.0–0.1)
Basophils Relative: 1 %
Eosinophils Absolute: 0.1 10*3/uL (ref 0.0–0.5)
Eosinophils Relative: 1 %
HCT: 42.7 % (ref 39.0–52.0)
Hemoglobin: 13.8 g/dL (ref 13.0–17.0)
Immature Granulocytes: 0 %
Lymphocytes Relative: 27 %
Lymphs Abs: 1.2 10*3/uL (ref 0.7–4.0)
MCH: 26.7 pg (ref 26.0–34.0)
MCHC: 32.3 g/dL (ref 30.0–36.0)
MCV: 82.8 fL (ref 80.0–100.0)
Monocytes Absolute: 0.5 10*3/uL (ref 0.1–1.0)
Monocytes Relative: 12 %
Neutro Abs: 2.5 10*3/uL (ref 1.7–7.7)
Neutrophils Relative %: 59 %
Platelet Count: 98 10*3/uL — ABNORMAL LOW (ref 150–400)
RBC: 5.16 MIL/uL (ref 4.22–5.81)
RDW: 20.7 % — ABNORMAL HIGH (ref 11.5–15.5)
WBC Count: 4.3 10*3/uL (ref 4.0–10.5)
nRBC: 0 % (ref 0.0–0.2)

## 2019-09-05 LAB — TOTAL PROTEIN, URINE DIPSTICK: Protein, ur: NEGATIVE mg/dL

## 2019-09-05 MED ORDER — SODIUM CHLORIDE 0.9% FLUSH
10.0000 mL | INTRAVENOUS | Status: DC | PRN
  Administered 2019-09-05: 09:00:00 10 mL
  Filled 2019-09-05: qty 10

## 2019-09-05 MED ORDER — PALONOSETRON HCL INJECTION 0.25 MG/5ML
INTRAVENOUS | Status: AC
  Filled 2019-09-05: qty 5

## 2019-09-05 MED ORDER — LEUCOVORIN CALCIUM INJECTION 350 MG
400.0000 mg/m2 | Freq: Once | INTRAVENOUS | Status: AC
  Administered 2019-09-05: 756 mg via INTRAVENOUS
  Filled 2019-09-05: qty 37.8

## 2019-09-05 MED ORDER — PALONOSETRON HCL INJECTION 0.25 MG/5ML
0.2500 mg | Freq: Once | INTRAVENOUS | Status: AC
  Administered 2019-09-05: 0.25 mg via INTRAVENOUS

## 2019-09-05 MED ORDER — DEXTROSE 5 % IV SOLN
Freq: Once | INTRAVENOUS | Status: AC
  Administered 2019-09-05: 11:00:00 via INTRAVENOUS
  Filled 2019-09-05: qty 250

## 2019-09-05 MED ORDER — DEXAMETHASONE SODIUM PHOSPHATE 10 MG/ML IJ SOLN
INTRAMUSCULAR | Status: AC
  Filled 2019-09-05: qty 1

## 2019-09-05 MED ORDER — OXALIPLATIN CHEMO INJECTION 100 MG/20ML
52.0000 mg/m2 | Freq: Once | INTRAVENOUS | Status: AC
  Administered 2019-09-05: 100 mg via INTRAVENOUS
  Filled 2019-09-05: qty 20

## 2019-09-05 MED ORDER — SODIUM CHLORIDE 0.9 % IV SOLN
2000.0000 mg/m2 | INTRAVENOUS | Status: DC
  Administered 2019-09-05: 14:00:00 3800 mg via INTRAVENOUS
  Filled 2019-09-05: qty 76

## 2019-09-05 MED ORDER — SODIUM CHLORIDE 0.9 % IV SOLN
5.2000 mg/kg | Freq: Once | INTRAVENOUS | Status: AC
  Administered 2019-09-05: 400 mg via INTRAVENOUS
  Filled 2019-09-05: qty 16

## 2019-09-05 MED ORDER — DEXAMETHASONE SODIUM PHOSPHATE 10 MG/ML IJ SOLN
10.0000 mg | Freq: Once | INTRAMUSCULAR | Status: AC
  Administered 2019-09-05: 10 mg via INTRAVENOUS

## 2019-09-05 NOTE — Telephone Encounter (Signed)
Scheduled appt per 9/23 los. °

## 2019-09-05 NOTE — Patient Instructions (Signed)
Coronavirus (COVID-19) Are you at risk?  Are you at risk for the Coronavirus (COVID-19)?  To be considered HIGH RISK for Coronavirus (COVID-19), you have to meet the following criteria:  . Traveled to [place], [place], [place], [place] or [place]; or in the [place] to [place], [place], [place], or [place]; and have fever, cough, and shortness of breath within the last 2 weeks of travel OR . Been in close contact with a person diagnosed with COVID-19 within the last 2 weeks and have fever, cough, and shortness of breath . IF YOU DO NOT MEET THESE CRITERIA, YOU ARE CONSIDERED LOW RISK FOR COVID-19.  What to do if you are HIGH RISK for COVID-19?  . If you are having a medical emergency, call 911. . Seek medical care right away. Before you go to a doctor's office, urgent care or emergency department, call ahead and tell them about your recent travel, contact with someone diagnosed with COVID-19, and your symptoms. You should receive instructions from your physician's office regarding next steps of care.  . When you arrive at healthcare provider, tell the healthcare staff immediately you have returned from visiting [place], [place], [place], [place] or [place]; or traveled in the [place] to [place], [place], [place], or [place]; in the last two weeks or you have been in close contact with a person diagnosed with COVID-19 in the last 2 weeks.   . Tell the health care staff about your symptoms: fever, cough and shortness of breath. . After you have been seen by a medical provider, you will be either: o Tested for (COVID-19) and discharged home on quarantine except to seek medical care if symptoms worsen, and asked to  - Stay home and avoid contact with others until you get your results (4-5 days)  - Avoid travel on public transportation if possible (such as bus, train, or airplane) or o Sent to the Emergency Department by EMS for evaluation, COVID-19 testing, and possible  admission depending on your condition and test results.  What to do if you are LOW RISK for COVID-19?  Reduce your risk of any infection by using the same precautions used for avoiding the common cold or flu:  . Wash your hands often with soap and warm water for at least 20 seconds.  If soap and water are not readily available, use an alcohol-based hand sanitizer with at least 60% alcohol.  . If coughing or sneezing, cover your mouth and nose by coughing or sneezing into the elbow areas of your shirt or coat, into a tissue or into your sleeve (not your hands). . Avoid shaking hands with others and consider head nods or verbal greetings only. . Avoid touching your eyes, nose, or mouth with unwashed hands.  . Avoid close contact with people who are sick. . Avoid places or events with large numbers of people in one location, like concerts or sporting events. . Carefully consider travel plans you have or are making. . If you are planning any travel outside or inside the [place], visit the CDC's Travelers' Health webpage for the latest health notices. . If you have some symptoms but not all symptoms, continue to monitor at home and seek medical attention if your symptoms worsen. . If you are having a medical emergency, call 911.   ADDITIONAL HEALTHCARE OPTIONS FOR PATIENTS  [facility] / e-Visit: [email or web address]         [facility]: [phone]  Onset   Urgent Care: [facility]: [place] Discharge Instructions for Patients Receiving Chemotherapy  Today you received the following chemotherapy agents Avastin, Oxaliplatin, Leucovorin and Adrucil   To help prevent nausea and vomiting after your treatment, we encourage you to take your nausea medication as directed.    If you develop nausea and vomiting that is not controlled by your nausea  medication, call the clinic.   BELOW ARE SYMPTOMS THAT SHOULD BE REPORTED IMMEDIATELY:  *FEVER GREATER THAN 100.5 F  *CHILLS WITH OR WITHOUT FEVER  NAUSEA AND VOMITING THAT IS NOT CONTROLLED WITH YOUR NAUSEA MEDICATION  *UNUSUAL SHORTNESS OF BREATH  *UNUSUAL BRUISING OR BLEEDING  TENDERNESS IN MOUTH AND THROAT WITH OR WITHOUT PRESENCE OF ULCERS  *URINARY PROBLEMS  *BOWEL PROBLEMS  UNUSUAL RASH Items with * indicate a potential emergency and should be followed up as soon as possible.  Feel free to call the clinic should you have any questions or concerns. The clinic phone number is [phone].  Please show the [place] at check-in to the Emergency Department and triage nurse.

## 2019-09-05 NOTE — Progress Notes (Signed)
Ok to treat with labs today per MD Burr Medico.

## 2019-09-07 ENCOUNTER — Other Ambulatory Visit: Payer: Self-pay

## 2019-09-07 ENCOUNTER — Inpatient Hospital Stay: Payer: Medicare Other

## 2019-09-07 VITALS — BP 142/79 | HR 57 | Temp 98.3°F | Resp 18

## 2019-09-07 DIAGNOSIS — D500 Iron deficiency anemia secondary to blood loss (chronic): Secondary | ICD-10-CM

## 2019-09-07 DIAGNOSIS — Z95828 Presence of other vascular implants and grafts: Secondary | ICD-10-CM

## 2019-09-07 DIAGNOSIS — Z5112 Encounter for antineoplastic immunotherapy: Secondary | ICD-10-CM | POA: Diagnosis not present

## 2019-09-07 MED ORDER — HEPARIN SOD (PORK) LOCK FLUSH 100 UNIT/ML IV SOLN
500.0000 [IU] | Freq: Once | INTRAVENOUS | Status: AC | PRN
  Administered 2019-09-07: 500 [IU]
  Filled 2019-09-07: qty 5

## 2019-09-07 MED ORDER — SODIUM CHLORIDE 0.9% FLUSH
10.0000 mL | INTRAVENOUS | Status: DC | PRN
  Administered 2019-09-07: 10 mL
  Filled 2019-09-07: qty 10

## 2019-09-07 NOTE — Patient Instructions (Signed)

## 2019-09-13 ENCOUNTER — Telehealth: Payer: Self-pay | Admitting: Hematology

## 2019-09-13 NOTE — Telephone Encounter (Signed)
Faxed records to Dr Georgina Snell office [phone]

## 2019-09-14 NOTE — Progress Notes (Signed)
Nathaniel Norton   [phone] Fax:([phone]) [phone]   Clinic Follow up Note   Patient Care Team: Lajean Manes, MD as PCP - General (Internal Medicine) Berle Mull, MD as Consulting Physician (Family Medicine) Clarene Essex, MD as Consulting Physician (Gastroenterology)  Date of Service:  09/19/2019  CHIEF COMPLAINT: F/u ofmetastaticcolon cancer  SUMMARY OF ONCOLOGIC HISTORY: Oncology History Overview Note  Cancer Staging Cancer of right colon [facility]) Staging form: Colon and Rectum, AJCC 8th Edition - Clinical stage from 04/09/2019: Stage IVC (cTX, cNX, pM1c) - Signed by Truitt Merle, MD on 05/03/2019     Cancer of right colon [facility])  04/09/2019 Procedure   Colonoscopy 04/09/19 by Dr Watt Climes IMPRESSION -internal hemorrhoids -Diverticulosis in the sigmoid colon  -2 small polyps in the rectum and in the proximal transverse colon, removed with a hot snare. Resected and retrieved.  -3 medium polyps in the proximal transverse colon, in the mid transverse colon and in the distal transverse colon, removed and resected and retrieved.  -likely malignant partially obstructing tumor at the hepatic flexure. biopsied, tattooed.  -1 large polyp in the mid ascending colon  -the examination was otherwise normal     04/09/2019 Initial Biopsy   FINAL MICROSCOPIC DIAGNOSIS: 04/09/19 1. LG intestine-hepatic flexure, Biopsy:   INVASIVE WELL DIFFERENTIATED ADENOCARCINOMA   04/09/2019 Cancer Staging   Staging form: Colon and Rectum, AJCC 8th Edition - Clinical stage from 04/09/2019: Stage IVC (cTX, cNX, pM1c) - Signed by Truitt Merle, MD on 05/03/2019   04/10/2019 Imaging   CT AP 04/10/19  IMPRESSION: 1. There is an eccentric mass of the colon involving the ascending colon near the hepatic flexure measuring approximately 3.4 x 3.4 by 2.0 cm (series 2, image 37, series 3, image 37). There is extensive soft tissue nodularity of the mesocolon and omentum, and likely areas of the  peritoneum, for example bilateral upper quadrants (series 2, image 25). Findings are consistent with primary colon malignancy, probable omental and peritoneal involvement, and small volume malignant ascites. 2.  Other chronic and incidental findings as detailed above.   04/20/2019 Initial Diagnosis   Cancer of right colon ([place])   04/26/2019 Imaging   CT Chest 04/26/19 IMPRESSION: 1. Borderline to mild lower thoracic adenopathy, including within the right internal mammary and juxta cardiophrenic stations. Given the appearance of the upper abdomen, suspicious for nodal metastasis. 2. A low right paratracheal node is borderline sized, but favored to be reactive. 3. No evidence of pulmonary metastasis. 4. Peritoneal metastasis and abdominal ascites, as before. 5. Pancreatic parenchymal calcifications indicative of chronic calcific pancreatitis. 6. Coronary artery atherosclerosis.   04/27/2019 Pathology Results   Diagnosis 04/27/19 Peritoneum, biopsy, right upper quadrant, perihepatic - ADENOCARCINOMA, CONSISTENT WITH COLONIC PRIMARY. - SEE COMMENT.   05/16/2019 -  Chemotherapy   First line FOLFOX every 2 weeks with Avastin starting with cycle 2 starting 05/16/19   07/23/2019 Imaging    CT AP W Contrast  IMPRESSION: 1. Primary ascending colon mass may be minimally smaller. Peritoneal metastatic disease appears slightly improved as well. 2. Chronic calcific pancreatitis. 3. Tiny left renal stone. 4. Small left inguinal hernia contains a knuckle of unobstructed colon. 5. Enlarged prostate.      CURRENT THERAPY:  First lineFOLFOX every 2 weeks with Avastin starting with cycle 2starting 05/16/19. 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   INTERVAL HISTORY:  [name] is here for a follow up and treatment. She presents to the clinic alone. He  notes he is thinking about restarting jogging when he feels better on his 2nd week. He  notes week of last infusion he felt fatigued and his neuropathy has improved. He also has loss of taste and cold sensitivity lasted longer. These recovered most of 2nd week.    REVIEW OF SYSTEMS:   Constitutional: Denies fevers, chills or abnormal weight loss Eyes: Denies blurriness of vision Ears, nose, mouth, throat, and face: Denies mucositis or sore throat Respiratory: Denies cough, dyspnea or wheezes Cardiovascular: Denies palpitation, chest discomfort or lower extremity swelling Gastrointestinal:  Denies nausea, heartburn or change in bowel habits Skin: Denies abnormal skin rashes Lymphatics: Denies new lymphadenopathy or easy bruising Neurological: (+) Improved neuropathy  Behavioral/Psych: Mood is stable, no new changes  All other systems were reviewed with the patient and are negative.  MEDICAL HISTORY:  Past Medical History:  Diagnosis Date  . colon ca dx'd 03/2019    SURGICAL HISTORY: Past Surgical History:  Procedure Laterality Date  . CATARACT EXTRACTION    . IR IMAGING GUIDED PORT INSERTION  05/10/2019  . L4 fracture  2012  . RETINAL DETACHMENT SURGERY    . Right hand surgery  1974    I have reviewed the social history and family history with the patient and they are unchanged from previous note.  ALLERGIES:  is allergic to feraheme [ferumoxytol] and codeine.  MEDICATIONS:  Current Outpatient Medications  Medication Sig Dispense Refill  . ferrous sulfate 325 (65 FE) MG tablet Take 325 mg by mouth daily with breakfast.    . lidocaine-prilocaine (EMLA) cream Apply to affected area once 30 g 3  . Multiple Vitamin (MULTIVITAMIN) tablet Take 1 tablet by mouth daily.    . ondansetron (ZOFRAN) 8 MG tablet Take 1 tablet (8 mg total) by mouth 2 (two) times daily as needed for refractory nausea / vomiting. Start on day 3 after chemotherapy. (Patient not taking: Reported on 09/05/2019) 30 tablet 1  . prochlorperazine (COMPAZINE) 10 MG tablet Take 1 tablet (10 mg total) by  mouth every 6 (six) hours as needed (Nausea or vomiting). (Patient not taking: Reported on 09/05/2019) 30 tablet 1   No current facility-administered medications for this visit.     PHYSICAL EXAMINATION: ECOG PERFORMANCE STATUS: 1 - Symptomatic but completely ambulatory  Vitals:   09/19/19 1120  BP: (!) 136/91  Pulse: 65  Resp: 18  Temp: 98.7 F (37.1 C)  SpO2: 99%   Filed Weights   09/19/19 1120  Weight: 164 lb 1.6 oz (74.4 kg)    GENERAL:alert, no distress and comfortable SKIN: skin color, texture, turgor are normal, no rashes or significant lesions EYES: normal, Conjunctiva are pink and non-injected, sclera clear  NECK: supple, thyroid normal size, non-tender, without nodularity LYMPH:  no palpable lymphadenopathy in the cervical, axillary  LUNGS: clear to auscultation and percussion with normal breathing effort HEART: regular rate & rhythm and no murmurs and no lower extremity edema ABDOMEN:abdomen soft, non-tender and normal bowel sounds Musculoskeletal:no cyanosis of digits and no clubbing  NEURO: alert & oriented x 3 with fluent speech, no focal motor/sensory deficits  LABORATORY DATA:  I have reviewed the data as listed CBC Latest Ref Rng & Units 09/19/2019 09/05/2019 08/22/2019  WBC 4.0 - 10.5 K/uL 3.7(L) 4.3 3.3(L)  Hemoglobin 13.0 - 17.0 g/dL 14.1 13.8 13.1  Hematocrit 39.0 - 52.0 % 40.8 42.7 40.3  Platelets 150 - 400 K/uL 110(L) 98(L) 88(L)     CMP Latest Ref Rng & Units 09/19/2019 09/05/2019 08/22/2019  Glucose 70 - 99 mg/dL 82 95 86  BUN 8 - 23 mg/dL _0 Creatinine 0.61 - 1.24 mg/dL 0.84 0.99 0.96  Sodium 135 - 145 mmol/L 139 142 142  Potassium 3.5 - 5.1 mmol/L 4.3 4.2 4.2  Chloride 98 - 111 mmol/L 105 107 107  CO2 22 - 32 mmol/L 20(L) 26 25  Calcium 8.9 - 10.3 mg/dL 9.5 9.5 9.7  Total Protein 6.5 - 8.1 g/dL 7.8 6.9 6.7  Total Bilirubin 0.3 - 1.2 mg/dL 1.0 1.2 1.0  Alkaline Phos 38 - 126 U/L 97 94 83  AST 15 - 41 U/L 34 32 30  ALT 0 - 44 U/L 34 42 36       RADIOGRAPHIC STUDIES: I have personally reviewed the radiological images as listed and agreed with the findings in the report. No results found.   ASSESSMENT & PLAN:  Nathaniel Norton is a 68 y.o. male with   1. Cancer of right colon,with peritoneal metastasis,stage IV,MMR normal, KRAS mutation (+), MSS -He wasrecentlydiagnosed in 03/2019. His Colonoscopy biopsy showed adenocarcinoma of right hepatic flexurecolon. -His peritoneal biopsy from 04/27/19 showsadenocarcinoma,consistent withmetastasis of his colon cancer. This is now stage IV disease which is no longer curable but still treatable.  -He has startedfirst line FOLFOX q2weekson6/3/20, Avastin added with cycle 2.He is tolerating mostly well withtemporary loss of appetite and controlled mild nausea. Constipation improved with chemo dose reduction. S/p C7 he started to experience neuropathy. Which has improved after holding one dose.  -CT AP from 07/23/19 shows partial response, will continue current treatment.  -I previously referred him to Dr. Clovis Riley to further discuss [place] surgery. He has not heard from his office yet. I will f/u.  -With oxaliplatin dose reduction of C9 he had prolonged cold sensitivity, more fatigue and loss of taste, but recovered well. His nausea well controlled and neuropathy has mildly improved.  -Labs reviewed, CBC and CMP WNL except WBC 3.7, PLT 110K. Ferritin and CEA still pending.  Overall adequate to proceed with C10 FOLFOX and Avastin today.  -I discussed I will likely stop Oxaliplatin in a few months given neuropathy. Will likely proceed with maintenance therapy after next scan and continue for as long as disease is controlled. I discussed surgery is still his best option for cancer treatment and encouraged him to consider. He understands.  -Plan for next scan in 10/2019.  -F/u in 2 weeks  -I recommend flu vaccination, he agrees but by late October given he is on chemo.    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  -GI workup with coloscopy by Dr. Watt Climes showed internal hemorrhoids, benign polyps, diverticulosis and colon cancer.  -He started on OTC multivitamin and oral iron in late 03/2019 and has been having black stool. This is likely from oral iron. -He has been treated with1doses of IV Feraheme on 05/03/19 and 05/16/19. He did have reaction to 2nd infusionand infusion was stopped. -Anemia recently resolved. If he needs more IV iron in the future, I may given another type of iron. -Ferritin still pending today   3. Elevated PSA  -PSA was 5.7 in 11/2018 and 4.6 in 01/2019  -He plans to wait on seeing Urologist for work up given colon cancer diagnosis. Thisis understandable.   4. Social Support -He is single with no children and no family that [place]. -He lives alone in 1 level [employer]. -He does not have Internet  access at home -He does have a cell phone which he can be reached. -If he needs help with transportation for treatment, I discussed our social workers can help him.  -I encouraged him to find someone to help him as needed at home.  5.Goal of care discussion  -The patient understands the goal of care is palliative. -he is full code now  6. Taste change, lower appetite  -Secondary to chemo -He lost 5 poundsafter first cycle chemobut was able to gain it back. -He will continue Ensure as needed and I encouraged him to eat more of what he can keep down.  -Will monitor and f/u with dietician as needed.  -With smaller frequent meals he is able to maintain weight.Stable.  7. Constipation  -secondary to chemo -Controlled with prophylactic Miralax, but after 2 episodes of emesis after taking it. He switched to Doculax -Improved with chemo dose reduction. Stable and controlled now. -He did try Protonix for acid reflux, but did not feel much  difference.He has not had Acid reflux lately.   8. Mild neuropathy  -S/p C7 FOLFOX, mainly in hands. Most cold related neuropathy  -Has mild sensory deficit of hands on exam today (09/05/19)  -Oxaliplatin dose reduced more than 50% starting with C9. Will monitor. May change to maintenance Xeloda in the near future.  -Has mildly improved with chemo dose reduction.    PLAN: -Will f/u on Dr. Georgina Snell referral -Labs reviewed and adequate to proceed with C10 FOLFOX and Avastin today -Lab, flush, f/uand chemo FOLFOX andAvastinin 2, 4, 6 weeks   No problem-specific Assessment & Plan notes found for this encounter.   No orders of the defined types were placed in this encounter.  All questions were answered. The patient knows to call the clinic with any problems, questions or concerns. No barriers to learning was detected. I spent 20 minutes counseling the patient face to face. The total time spent in the appointment was 25 minutes and more than 50% was on counseling and review of test results     Truitt Merle, MD 09/19/2019   I, Joslyn Devon, am acting as scribe for Truitt Merle, MD.   I have reviewed the above documentation for accuracy and completeness, and I agree with the above.

## 2019-09-14 NOTE — Progress Notes (Signed)
The following biosimilar Zirabev (bevacizumab-bvzr) has been selected for use in this patient.  Maggie Dow Blahnik, PharmD, BCPS Oncology Pharmacist Pharmacy Phone: [phone] 09/14/2019     

## 2019-09-19 ENCOUNTER — Inpatient Hospital Stay: Payer: Medicare Other | Attending: Hematology

## 2019-09-19 ENCOUNTER — Inpatient Hospital Stay: Payer: Medicare Other

## 2019-09-19 ENCOUNTER — Encounter: Payer: Self-pay | Admitting: Hematology

## 2019-09-19 ENCOUNTER — Inpatient Hospital Stay (HOSPITAL_BASED_OUTPATIENT_CLINIC_OR_DEPARTMENT_OTHER): Payer: Medicare Other | Admitting: Hematology

## 2019-09-19 ENCOUNTER — Other Ambulatory Visit: Payer: Self-pay

## 2019-09-19 VITALS — BP 136/91 | HR 65 | Temp 98.7°F | Resp 18 | Ht 66.0 in | Wt 164.1 lb

## 2019-09-19 VITALS — BP 135/85

## 2019-09-19 DIAGNOSIS — G620 Drug-induced polyneuropathy: Secondary | ICD-10-CM | POA: Insufficient documentation

## 2019-09-19 DIAGNOSIS — C786 Secondary malignant neoplasm of retroperitoneum and peritoneum: Secondary | ICD-10-CM | POA: Insufficient documentation

## 2019-09-19 DIAGNOSIS — K5900 Constipation, unspecified: Secondary | ICD-10-CM | POA: Insufficient documentation

## 2019-09-19 DIAGNOSIS — K219 Gastro-esophageal reflux disease without esophagitis: Secondary | ICD-10-CM | POA: Diagnosis not present

## 2019-09-19 DIAGNOSIS — D696 Thrombocytopenia, unspecified: Secondary | ICD-10-CM | POA: Diagnosis not present

## 2019-09-19 DIAGNOSIS — C182 Malignant neoplasm of ascending colon: Secondary | ICD-10-CM

## 2019-09-19 DIAGNOSIS — Z452 Encounter for adjustment and management of vascular access device: Secondary | ICD-10-CM | POA: Diagnosis present

## 2019-09-19 DIAGNOSIS — Z5112 Encounter for antineoplastic immunotherapy: Secondary | ICD-10-CM | POA: Insufficient documentation

## 2019-09-19 DIAGNOSIS — Z23 Encounter for immunization: Secondary | ICD-10-CM | POA: Diagnosis not present

## 2019-09-19 DIAGNOSIS — R5383 Other fatigue: Secondary | ICD-10-CM | POA: Diagnosis not present

## 2019-09-19 DIAGNOSIS — Z79899 Other long term (current) drug therapy: Secondary | ICD-10-CM | POA: Insufficient documentation

## 2019-09-19 DIAGNOSIS — R110 Nausea: Secondary | ICD-10-CM | POA: Diagnosis not present

## 2019-09-19 DIAGNOSIS — D500 Iron deficiency anemia secondary to blood loss (chronic): Secondary | ICD-10-CM

## 2019-09-19 DIAGNOSIS — R630 Anorexia: Secondary | ICD-10-CM | POA: Diagnosis not present

## 2019-09-19 DIAGNOSIS — Z5111 Encounter for antineoplastic chemotherapy: Secondary | ICD-10-CM | POA: Insufficient documentation

## 2019-09-19 DIAGNOSIS — Z95828 Presence of other vascular implants and grafts: Secondary | ICD-10-CM

## 2019-09-19 LAB — CBC WITH DIFFERENTIAL (CANCER CENTER ONLY)
Abs Immature Granulocytes: 0.01 10*3/uL (ref 0.00–0.07)
Basophils Absolute: 0 10*3/uL (ref 0.0–0.1)
Basophils Relative: 1 %
Eosinophils Absolute: 0.1 10*3/uL (ref 0.0–0.5)
Eosinophils Relative: 2 %
HCT: 40.8 % (ref 39.0–52.0)
Hemoglobin: 14.1 g/dL (ref 13.0–17.0)
Immature Granulocytes: 0 %
Lymphocytes Relative: 33 %
Lymphs Abs: 1.2 10*3/uL (ref 0.7–4.0)
MCH: 29.4 pg (ref 26.0–34.0)
MCHC: 34.6 g/dL (ref 30.0–36.0)
MCV: 85.2 fL (ref 80.0–100.0)
Monocytes Absolute: 0.4 10*3/uL (ref 0.1–1.0)
Monocytes Relative: 12 %
Neutro Abs: 2 10*3/uL (ref 1.7–7.7)
Neutrophils Relative %: 52 %
Platelet Count: 110 10*3/uL — ABNORMAL LOW (ref 150–400)
RBC: 4.79 MIL/uL (ref 4.22–5.81)
RDW: 19.5 % — ABNORMAL HIGH (ref 11.5–15.5)
WBC Count: 3.7 10*3/uL — ABNORMAL LOW (ref 4.0–10.5)
nRBC: 0 % (ref 0.0–0.2)

## 2019-09-19 LAB — CMP (CANCER CENTER ONLY)
ALT: 34 U/L (ref 0–44)
AST: 34 U/L (ref 15–41)
Albumin: 3.9 g/dL (ref 3.5–5.0)
Alkaline Phosphatase: 97 U/L (ref 38–126)
Anion gap: 14 (ref 5–15)
BUN: 12 mg/dL (ref 8–23)
CO2: 20 mmol/L — ABNORMAL LOW (ref 22–32)
Calcium: 9.5 mg/dL (ref 8.9–10.3)
Chloride: 105 mmol/L (ref 98–111)
Creatinine: 0.84 mg/dL (ref 0.61–1.24)
GFR, Est AFR Am: >60 mL/min (ref >60)
GFR, Estimated: >60 mL/min (ref >60)
Glucose, Bld: 82 mg/dL (ref 70–99)
Potassium: 4.3 mmol/L (ref 3.5–5.1)
Sodium: 139 mmol/L (ref 135–145)
Total Bilirubin: 1 mg/dL (ref 0.3–1.2)
Total Protein: 7.8 g/dL (ref 6.5–8.1)

## 2019-09-19 LAB — FERRITIN: Ferritin: 145 ng/mL (ref 24–336)

## 2019-09-19 LAB — CEA (IN HOUSE-CHCC): CEA (CHCC-In House): 1.59 ng/mL (ref 0.00–5.00)

## 2019-09-19 MED ORDER — DEXTROSE 5 % IV SOLN
Freq: Once | INTRAVENOUS | Status: AC
  Administered 2019-09-19: 12:00:00 via INTRAVENOUS
  Filled 2019-09-19: qty 250

## 2019-09-19 MED ORDER — SODIUM CHLORIDE 0.9 % IV SOLN
2000.0000 mg/m2 | INTRAVENOUS | Status: DC
  Administered 2019-09-19: 3800 mg via INTRAVENOUS
  Filled 2019-09-19: qty 76

## 2019-09-19 MED ORDER — LEUCOVORIN CALCIUM INJECTION 350 MG
400.0000 mg/m2 | Freq: Once | INTRAVENOUS | Status: AC
  Administered 2019-09-19: 756 mg via INTRAVENOUS
  Filled 2019-09-19: qty 37.8

## 2019-09-19 MED ORDER — SODIUM CHLORIDE 0.9 % IV SOLN
5.0000 mg/kg | Freq: Once | INTRAVENOUS | Status: AC
  Administered 2019-09-19: 13:00:00 400 mg via INTRAVENOUS
  Filled 2019-09-19: qty 16

## 2019-09-19 MED ORDER — DEXAMETHASONE SODIUM PHOSPHATE 10 MG/ML IJ SOLN
INTRAMUSCULAR | Status: AC
  Filled 2019-09-19: qty 1

## 2019-09-19 MED ORDER — PALONOSETRON HCL INJECTION 0.25 MG/5ML
0.2500 mg | Freq: Once | INTRAVENOUS | Status: AC
  Administered 2019-09-19: 0.25 mg via INTRAVENOUS

## 2019-09-19 MED ORDER — DEXAMETHASONE SODIUM PHOSPHATE 10 MG/ML IJ SOLN
10.0000 mg | Freq: Once | INTRAMUSCULAR | Status: AC
  Administered 2019-09-19: 10 mg via INTRAVENOUS

## 2019-09-19 MED ORDER — PALONOSETRON HCL INJECTION 0.25 MG/5ML
INTRAVENOUS | Status: AC
  Filled 2019-09-19: qty 5

## 2019-09-19 MED ORDER — DEXTROSE 5 % IV SOLN
Freq: Once | INTRAVENOUS | Status: AC
  Administered 2019-09-19: 13:00:00 via INTRAVENOUS
  Filled 2019-09-19: qty 250

## 2019-09-19 MED ORDER — OXALIPLATIN CHEMO INJECTION 100 MG/20ML
52.0000 mg/m2 | Freq: Once | INTRAVENOUS | Status: AC
  Administered 2019-09-19: 13:00:00 100 mg via INTRAVENOUS
  Filled 2019-09-19: qty 20

## 2019-09-19 MED ORDER — SODIUM CHLORIDE 0.9% FLUSH
10.0000 mL | INTRAVENOUS | Status: DC | PRN
  Administered 2019-09-19: 10 mL
  Filled 2019-09-19: qty 10

## 2019-09-20 ENCOUNTER — Telehealth: Payer: Self-pay | Admitting: Hematology

## 2019-09-20 NOTE — Telephone Encounter (Signed)
No los per 10/7. °

## 2019-09-21 ENCOUNTER — Inpatient Hospital Stay: Payer: Medicare Other

## 2019-09-21 ENCOUNTER — Other Ambulatory Visit: Payer: Self-pay

## 2019-09-21 ENCOUNTER — Telehealth: Payer: Self-pay

## 2019-09-21 VITALS — BP 129/68 | HR 65 | Temp 98.7°F | Resp 16

## 2019-09-21 DIAGNOSIS — Z452 Encounter for adjustment and management of vascular access device: Secondary | ICD-10-CM | POA: Diagnosis not present

## 2019-09-21 DIAGNOSIS — C182 Malignant neoplasm of ascending colon: Secondary | ICD-10-CM

## 2019-09-21 MED ORDER — SODIUM CHLORIDE 0.9% FLUSH
10.0000 mL | INTRAVENOUS | Status: DC | PRN
  Administered 2019-09-21: 10 mL
  Filled 2019-09-21: qty 10

## 2019-09-21 MED ORDER — HEPARIN SOD (PORK) LOCK FLUSH 100 UNIT/ML IV SOLN
500.0000 [IU] | Freq: Once | INTRAVENOUS | Status: AC | PRN
  Administered 2019-09-21: 14:00:00 500 [IU]
  Filled 2019-09-21: qty 5

## 2019-09-21 NOTE — Telephone Encounter (Signed)
Faxed [facility] Report, most recent CT scan, and CT biopsy report to Dr. Georgina Snell office at [facility] at [phone]. Also asked Tedra Coupe in radiology to power-share CT scans to [facility] so Dr. Clovis Riley can review images. Received confirmation that fax went through successfully.

## 2019-09-27 ENCOUNTER — Telehealth: Payer: Self-pay | Admitting: Hematology

## 2019-09-27 NOTE — Telephone Encounter (Signed)
Faxed medical records to Dr. Clovis Riley for a referral at [phone]. Release [id number]

## 2019-10-02 NOTE — Progress Notes (Addendum)
[place]   [phone] [phone]   Clinic Follow up Note   Patient Care Team: Nathaniel Manes, MD as PCP - General (Internal Medicine) Nathaniel Mull, MD as Consulting Physician (Family Medicine) Nathaniel Essex, MD as Consulting Physician (Gastroenterology) 10/03/2019  CHIEF COMPLAINT: F/u metastatic colon cancer   SUMMARY OF ONCOLOGIC HISTORY: Oncology History Overview Note  Cancer Staging Cancer of right colon [facility]) Staging form: Colon and Rectum, AJCC 8th Edition - Clinical stage from 04/09/2019: Stage IVC (cTX, cNX, pM1c) - Signed by Nathaniel Merle, MD on 05/03/2019     Cancer of right colon [facility])  04/09/2019 Procedure   Colonoscopy 04/09/19 by Dr Nathaniel Norton IMPRESSION -internal hemorrhoids -Diverticulosis in the sigmoid colon  -2 small polyps in the rectum and in the proximal transverse colon, removed with a hot snare. Resected and retrieved.  -3 medium polyps in the proximal transverse colon, in the mid transverse colon and in the distal transverse colon, removed and resected and retrieved.  -likely malignant partially obstructing tumor at the hepatic flexure. biopsied, tattooed.  -1 large polyp in the mid ascending colon  -the examination was otherwise normal     04/09/2019 Initial Biopsy   FINAL MICROSCOPIC DIAGNOSIS: 04/09/19 1. LG intestine-hepatic flexure, Biopsy:   INVASIVE WELL DIFFERENTIATED ADENOCARCINOMA   04/09/2019 Cancer Staging   Staging form: Colon and Rectum, AJCC 8th Edition - Clinical stage from 04/09/2019: Stage IVC (cTX, cNX, pM1c) - Signed by Nathaniel Merle, MD on 05/03/2019   04/10/2019 Imaging   CT AP 04/10/19  IMPRESSION: 1. There is an eccentric mass of the colon involving the ascending colon near the hepatic flexure measuring approximately 3.4 x 3.4 by 2.0 cm (series 2, image 37, series 3, image 37). There is extensive soft tissue nodularity of the mesocolon and omentum, and likely areas of the peritoneum, for example  bilateral upper quadrants (series 2, image 25). Findings are consistent with primary colon malignancy, probable omental and peritoneal involvement, and small volume malignant ascites. 2.  Other chronic and incidental findings as detailed above.   04/20/2019 Initial Diagnosis   Cancer of right colon ([place])   04/26/2019 Imaging   CT Chest 04/26/19 IMPRESSION: 1. Borderline to mild lower thoracic adenopathy, including within the right internal mammary and juxta cardiophrenic stations. Given the appearance of the upper abdomen, suspicious for nodal metastasis. 2. A low right paratracheal node is borderline sized, but favored to be reactive. 3. No evidence of pulmonary metastasis. 4. Peritoneal metastasis and abdominal ascites, as before. 5. Pancreatic parenchymal calcifications indicative of chronic calcific pancreatitis. 6. Coronary artery atherosclerosis.   04/27/2019 Pathology Results   Diagnosis 04/27/19 Peritoneum, biopsy, right upper quadrant, perihepatic - ADENOCARCINOMA, CONSISTENT WITH COLONIC PRIMARY. - SEE COMMENT.   05/16/2019 -  Chemotherapy   First line FOLFOX every 2 weeks with Avastin starting with cycle 2 starting 05/16/19   07/23/2019 Imaging    CT AP W Contrast  IMPRESSION: 1. Primary ascending colon mass may be minimally smaller. Peritoneal metastatic disease appears slightly improved as well. 2. Chronic calcific pancreatitis. 3. Tiny left renal stone. 4. Small left inguinal hernia contains a knuckle of unobstructed colon. 5. Enlarged prostate.     CURRENT THERAPY: First lineFOLFOX every 2 weeks with Avastin starting with cycle 2starting 05/16/19. Oxaliplatin held C8and then reduced starting C9due toneurotoxicityand thrombocytopenia.   INTERVAL HISTORY: Nathaniel Norton returns for f/u and treatment as scheduled. He completed cycle 10 on 09/19/19. He is doing well. He feels unwell with fatigue, decreased taste/appetite, and  mild nausea from days 3 - 5 or 6. He  recovers well. Continues to be able to work in the yard and be active. Has a sore tooth in the back right but no other mucositis. Not limiting po intake. He saw a dentist this week who thinks he may have an irritated nerve. Home meds control nausea, he takes them very rarely. Has 1 BM per day. Noticed blood tinged nasal drainage 1-2 times in past 2 weeks, denies other bleeding such as epistaxis, hematuria, GI bleeding, or major bruising. No new or worsening ABD pain. Tingling remains stable, able to function without difficulty. Cold sensitivity lessens over time but does not completely resolve. Otherwise, denies fever, chills, cough, chest pain, dyspnea, or leg edema.    MEDICAL HISTORY:  Past Medical History:  Diagnosis Date  . colon ca dx'd 03/2019    SURGICAL HISTORY: Past Surgical History:  Procedure Laterality Date  . CATARACT EXTRACTION    . IR IMAGING GUIDED PORT INSERTION  05/10/2019  . L4 fracture  2012  . RETINAL DETACHMENT SURGERY    . Right hand surgery  1974    I have reviewed the social history and family history with the patient and they are unchanged from previous note.  ALLERGIES:  is allergic to feraheme [ferumoxytol] and codeine.  MEDICATIONS:  Current Outpatient Medications  Medication Sig Dispense Refill  . ferrous sulfate 325 (65 FE) MG tablet Take 325 mg by mouth daily with breakfast.    . lidocaine-prilocaine (EMLA) cream Apply to affected area once 30 g 3  . Multiple Vitamin (MULTIVITAMIN) tablet Take 1 tablet by mouth daily.    . ondansetron (ZOFRAN) 8 MG tablet Take 1 tablet (8 mg total) by mouth 2 (two) times daily as needed for refractory nausea / vomiting. Start on day 3 after chemotherapy. 30 tablet 1  . prochlorperazine (COMPAZINE) 10 MG tablet Take 1 tablet (10 mg total) by mouth every 6 (six) hours as needed (Nausea or vomiting). 30 tablet 1   No current facility-administered medications for this visit.    Facility-Administered Medications Ordered in  Other Visits  Medication Dose Route Frequency Provider Last Rate Last Dose  . 0.9 %  sodium chloride infusion   Intravenous Continuous Nathaniel Pier, MD 20 mL/hr at 10/03/19 [phone]    . bevacizumab-bvzr (ZIRABEV) 400 mg in sodium chloride 0.9 % 100 mL chemo infusion  5 mg/kg (Treatment Plan Recorded) Intravenous Once Nathaniel Merle, MD      . fluorouracil (ADRUCIL) 3,800 mg in sodium chloride 0.9 % 74 mL chemo infusion  2,000 mg/m2 (Treatment Plan Recorded) Intravenous 1 day or 1 dose Nathaniel Merle, MD      . leucovorin 756 mg in dextrose 5 % 250 mL infusion  400 mg/m2 (Treatment Plan Recorded) Intravenous Once Nathaniel Merle, MD        PHYSICAL EXAMINATION: ECOG PERFORMANCE STATUS: 1 - Symptomatic but completely ambulatory  Vitals:   10/03/19 0850  BP: (!) 144/78  Pulse: (!) 56  Resp: 17  Temp: 97.8 F (36.6 C)  SpO2: 98%   Filed Weights   10/03/19 0850  Weight: 162 lb 12.8 oz (73.8 kg)    GENERAL:alert, no distress and comfortable SKIN: facial rosacea, no other rash  EYES: sclera clear OROPHARYNX: no thrush or ulcers NECK: without mass LUNGS: clear with normal breathing effort HEART: regular rate & rhythm, no lower extremity edema ABDOMEN:abdomen soft, non-tender and normal bowel sounds. No hepatomegaly Musculoskeletal:no cyanosis of digits  NEURO: alert &  oriented x 3 with fluent speech, no focal motor/sensory deficits per tuning fork exam PAC without erythema   LABORATORY DATA:  I have reviewed the data as listed CBC Latest Ref Rng & Units 10/03/2019 09/19/2019 09/05/2019  WBC 4.0 - 10.5 K/uL 3.2(L) 3.7(L) 4.3  Hemoglobin 13.0 - 17.0 g/dL 12.9(L) 14.1 13.8  Hematocrit 39.0 - 52.0 % 39.4 40.8 42.7  Platelets 150 - 400 K/uL 79(L) 110(L) 98(L)     CMP Latest Ref Rng & Units 10/03/2019 09/19/2019 09/05/2019  Glucose 70 - 99 mg/dL 113(H) 82 95  BUN 8 - 23 mg/dL _0 Creatinine 0.61 - 1.24 mg/dL 0.85 0.84 0.99  Sodium 135 - 145 mmol/L 142 139 142  Potassium 3.5 - 5.1 mmol/L 3.8  4.3 4.2  Chloride 98 - 111 mmol/L 106 105 107  CO2 22 - 32 mmol/L 26 20(L) 26  Calcium 8.9 - 10.3 mg/dL 9.2 9.5 9.5  Total Protein 6.5 - 8.1 g/dL 6.5 7.8 6.9  Total Bilirubin 0.3 - 1.2 mg/dL 2.0(H) 1.0 1.2  Alkaline Phos 38 - 126 U/L 73 97 94  AST 15 - 41 U/L 20 34 32  ALT 0 - 44 U/L 18 34 42      RADIOGRAPHIC STUDIES: I have personally reviewed the radiological images as listed and agreed with the findings in the report. No results found.   ASSESSMENT & PLAN: Nathaniel Norton a 68 y.o.malewith   1. Cancer of right colon,with peritoneal metastasis,stage IV,MMR normal -He wasrecentlydiagnosed in 03/2019. His Colonoscopy biopsy showed adenocarcinoma of right hepatic flexurecolon.Peritoneal biopsy from 04/27/19 showsadenocarcinoma,consistent withmetastasis of his colon cancer, stage IV disease -tumor isMMRnormal, he is not a candidate forimmunotherapy.FO resultshows MSI-Stable, KRASmutation (+). He is not eligible for immunotherapy at this time nor EGFR inhibitor. He will continue with biological agent Avastin. -He has been referred to Dr. Clovis Riley for surgical consult -S/p cycle 10, oxaliplatin and 5FU have been dose reduced. He continues to tolerate treatment moderately well overall  2. Peripheral neuropathy, secondary to Oxaliplatin -he developed mild intermittent tingling in fingertips after cycle 6 FOLFOX and progressed to constant tingling after cycle 7 -oxaliplatin was previously reduced for constipation -no functional difficulties or neuro deficits on exam -he has progressive cold sensitivity after cycle 7. 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  -GI workup with coloscopy by Dr. Watt Norton showed internal hemorrhoids, benign polyps, diverticulosis  and colon cancer.  -He was started on OTC multivitamin and oral iron in late 03/2019 and has been havingintermittentblack stoollikely related to iron supplement -s/p1doses of IV Feraheme on 05/03/19 and 05/16/19. He did have reaction to 2nd infusionand it was stopped. -ferritin on 10/7 at 145, he takes oral iron once daily. He does not need additional IV feraheme at this time  4. Elevated PSA  -PSA was 5.7 in 11/2018 and 4.6 in 01/2019 -urology f/u deferred due to metastatic colon cancer  5. Social Support -limited social support  6.Goal of care discussion-full code -he understands the treatment goal is palliative; will continue as long as he can tolerate and it controls his disease  7. Taste change, lower appetite  -Secondary to chemo -followed by dietician -worse on days 3-6 after chemo, recovers well  Disposition: Mr. Steinberger appears stable. He completed cycle 10 FOLFIRI and continues Avastin. He tolerates treatment  moderately well with mild fatigue, nausea, and decreased appetite from days 3 to 6. Neuropathy and cold sensitivity are stable overall. He is able to recover well. Labs reviewed, CBC with worsening thrombocytopenia, PLT 79K without significant bleeding. CMP is unremarkable except bili is 2.0. He has no RUQ or juandice. Will repeat labs next week. Proceed with cycle 11 treatment today, holding oxaliplatin for thrombocytopenia. He will receive Avastin, leucovorin, and 5FU only. He will monitor for increased bleeding and knows to call clinic if needed. He will return for f/u and next cycle in 2 weeks. Plan to restage in 10/2019. I reviewed the plan with Dr. Burr Medico who agrees.    Orders Placed This Encounter  Procedures  . CT Abdomen Pelvis W Contrast    Standing Status:   Future    Standing Expiration Date:   10/02/2020    Order Specific Question:   If indicated for the ordered procedure, I authorize the administration of contrast media per Radiology protocol     Answer:   Yes    Order Specific Question:   Preferred imaging location?    Answer:   [facility]    Order Specific Question:   Is Oral Contrast requested for this exam?    Answer:   Yes, Per Radiology protocol    Order Specific Question:   Radiology Contrast Protocol - do NOT remove file path    Answer:   [email or web address]   All questions were answered. The patient knows to call the clinic with any problems, questions or concerns. No barriers to learning was detected.     Alla Feeling, NP 10/03/19

## 2019-10-03 ENCOUNTER — Inpatient Hospital Stay (HOSPITAL_BASED_OUTPATIENT_CLINIC_OR_DEPARTMENT_OTHER): Payer: Medicare Other | Admitting: Nurse Practitioner

## 2019-10-03 ENCOUNTER — Encounter: Payer: Self-pay | Admitting: Nurse Practitioner

## 2019-10-03 ENCOUNTER — Inpatient Hospital Stay: Payer: Medicare Other

## 2019-10-03 ENCOUNTER — Telehealth: Payer: Self-pay | Admitting: Nurse Practitioner

## 2019-10-03 ENCOUNTER — Other Ambulatory Visit: Payer: Self-pay

## 2019-10-03 VITALS — BP 144/78 | HR 56 | Temp 97.8°F | Resp 17 | Ht 66.0 in | Wt 162.8 lb

## 2019-10-03 DIAGNOSIS — C182 Malignant neoplasm of ascending colon: Secondary | ICD-10-CM

## 2019-10-03 DIAGNOSIS — D500 Iron deficiency anemia secondary to blood loss (chronic): Secondary | ICD-10-CM

## 2019-10-03 DIAGNOSIS — Z95828 Presence of other vascular implants and grafts: Secondary | ICD-10-CM

## 2019-10-03 DIAGNOSIS — Z452 Encounter for adjustment and management of vascular access device: Secondary | ICD-10-CM | POA: Diagnosis not present

## 2019-10-03 DIAGNOSIS — D490 Neoplasm of unspecified behavior of digestive system: Secondary | ICD-10-CM

## 2019-10-03 DIAGNOSIS — Z23 Encounter for immunization: Secondary | ICD-10-CM

## 2019-10-03 LAB — CBC WITH DIFFERENTIAL (CANCER CENTER ONLY)
Abs Immature Granulocytes: 0.01 10*3/uL (ref 0.00–0.07)
Basophils Absolute: 0 10*3/uL (ref 0.0–0.1)
Basophils Relative: 1 %
Eosinophils Absolute: 0 10*3/uL (ref 0.0–0.5)
Eosinophils Relative: 1 %
HCT: 39.4 % (ref 39.0–52.0)
Hemoglobin: 12.9 g/dL — ABNORMAL LOW (ref 13.0–17.0)
Immature Granulocytes: 0 %
Lymphocytes Relative: 27 %
Lymphs Abs: 0.9 10*3/uL (ref 0.7–4.0)
MCH: 28.2 pg (ref 26.0–34.0)
MCHC: 32.7 g/dL (ref 30.0–36.0)
MCV: 86 fL (ref 80.0–100.0)
Monocytes Absolute: 0.4 10*3/uL (ref 0.1–1.0)
Monocytes Relative: 12 %
Neutro Abs: 1.9 10*3/uL (ref 1.7–7.7)
Neutrophils Relative %: 59 %
Platelet Count: 79 10*3/uL — ABNORMAL LOW (ref 150–400)
RBC: 4.58 MIL/uL (ref 4.22–5.81)
RDW: 19 % — ABNORMAL HIGH (ref 11.5–15.5)
WBC Count: 3.2 10*3/uL — ABNORMAL LOW (ref 4.0–10.5)
nRBC: 0 % (ref 0.0–0.2)

## 2019-10-03 LAB — CMP (CANCER CENTER ONLY)
ALT: 18 U/L (ref 0–44)
AST: 20 U/L (ref 15–41)
Albumin: 3.7 g/dL (ref 3.5–5.0)
Alkaline Phosphatase: 73 U/L (ref 38–126)
Anion gap: 10 (ref 5–15)
BUN: 12 mg/dL (ref 8–23)
CO2: 26 mmol/L (ref 22–32)
Calcium: 9.2 mg/dL (ref 8.9–10.3)
Chloride: 106 mmol/L (ref 98–111)
Creatinine: 0.85 mg/dL (ref 0.61–1.24)
GFR, Est AFR Am: >60 mL/min (ref >60)
GFR, Estimated: >60 mL/min (ref >60)
Glucose, Bld: 113 mg/dL — ABNORMAL HIGH (ref 70–99)
Potassium: 3.8 mmol/L (ref 3.5–5.1)
Sodium: 142 mmol/L (ref 135–145)
Total Bilirubin: 2 mg/dL — ABNORMAL HIGH (ref 0.3–1.2)
Total Protein: 6.5 g/dL (ref 6.5–8.1)

## 2019-10-03 MED ORDER — LEUCOVORIN CALCIUM INJECTION 350 MG
400.0000 mg/m2 | Freq: Once | INTRAVENOUS | Status: DC
  Filled 2019-10-03: qty 37.8

## 2019-10-03 MED ORDER — DEXAMETHASONE SODIUM PHOSPHATE 10 MG/ML IJ SOLN
10.0000 mg | Freq: Once | INTRAMUSCULAR | Status: AC
  Administered 2019-10-03: 10 mg via INTRAVENOUS

## 2019-10-03 MED ORDER — LEUCOVORIN CALCIUM INJECTION 350 MG
400.0000 mg/m2 | Freq: Once | INTRAVENOUS | Status: AC
  Administered 2019-10-03: 756 mg via INTRAVENOUS
  Filled 2019-10-03: qty 37.8

## 2019-10-03 MED ORDER — DEXAMETHASONE SODIUM PHOSPHATE 10 MG/ML IJ SOLN
INTRAMUSCULAR | Status: AC
  Filled 2019-10-03: qty 1

## 2019-10-03 MED ORDER — SODIUM CHLORIDE 0.9 % IV SOLN
2000.0000 mg/m2 | INTRAVENOUS | Status: DC
  Administered 2019-10-03: 3800 mg via INTRAVENOUS
  Filled 2019-10-03: qty 76

## 2019-10-03 MED ORDER — SODIUM CHLORIDE 0.9 % IV SOLN
INTRAVENOUS | Status: DC
  Administered 2019-10-03: 10:00:00 via INTRAVENOUS
  Filled 2019-10-03: qty 250

## 2019-10-03 MED ORDER — SODIUM CHLORIDE 0.9 % IV SOLN
5.0000 mg/kg | Freq: Once | INTRAVENOUS | Status: AC
  Administered 2019-10-03: 400 mg via INTRAVENOUS
  Filled 2019-10-03: qty 16

## 2019-10-03 MED ORDER — SODIUM CHLORIDE 0.9% FLUSH
10.0000 mL | INTRAVENOUS | Status: DC | PRN
  Administered 2019-10-03: 10 mL
  Filled 2019-10-03: qty 10

## 2019-10-03 MED ORDER — PALONOSETRON HCL INJECTION 0.25 MG/5ML
INTRAVENOUS | Status: AC
  Filled 2019-10-03: qty 5

## 2019-10-03 MED ORDER — INFLUENZA VAC A&B SA ADJ QUAD 0.5 ML IM PRSY
0.5000 mL | PREFILLED_SYRINGE | Freq: Once | INTRAMUSCULAR | Status: AC
  Administered 2019-10-03: 0.5 mL via INTRAMUSCULAR

## 2019-10-03 MED ORDER — INFLUENZA VAC A&B SA ADJ QUAD 0.5 ML IM PRSY
PREFILLED_SYRINGE | INTRAMUSCULAR | Status: AC
  Filled 2019-10-03: qty 0.5

## 2019-10-03 MED ORDER — PALONOSETRON HCL INJECTION 0.25 MG/5ML
0.2500 mg | Freq: Once | INTRAVENOUS | Status: AC
  Administered 2019-10-03: 0.25 mg via INTRAVENOUS

## 2019-10-03 NOTE — Patient Instructions (Signed)
Nathaniel Norton Discharge Instructions for Patients Receiving Chemotherapy  Today you received the following chemotherapy agents Bevacizumab (ZIRABAV), Leucovorin & Flourouracil (ADRUCIL).  To help prevent nausea and vomiting after your treatment, we encourage you to take your nausea medication as prescribed.   If you develop nausea and vomiting that is not controlled by your nausea medication, call the clinic.   BELOW ARE SYMPTOMS THAT SHOULD BE REPORTED IMMEDIATELY:  *FEVER GREATER THAN 100.5 F  *CHILLS WITH OR WITHOUT FEVER  NAUSEA AND VOMITING THAT IS NOT CONTROLLED WITH YOUR NAUSEA MEDICATION  *UNUSUAL SHORTNESS OF BREATH  *UNUSUAL BRUISING OR BLEEDING  TENDERNESS IN MOUTH AND THROAT WITH OR WITHOUT PRESENCE OF ULCERS  *URINARY PROBLEMS  *BOWEL PROBLEMS  UNUSUAL RASH Items with * indicate a potential emergency and should be followed up as soon as possible.  Feel free to call the clinic should you have any questions or concerns. The clinic phone number is [phone].  Please show the [place] [place] at check-in to the Emergency Department and triage nurse.  Influenza Virus Vaccine injection What is this medicine? INFLUENZA VIRUS VACCINE (in floo EN zuh VAHY ruhs vak SEEN) helps to reduce the risk of getting influenza also known as the flu. The vaccine only helps protect you against some strains of the flu. This medicine may be used for other purposes; ask your health care provider or pharmacist if you have questions. COMMON BRAND NAME(S): Afluria, Afluria Quadrivalent, Agriflu, Alfuria, FLUAD, Fluarix, Fluarix Quadrivalent, Flublok, Flublok Quadrivalent, FLUCELVAX, Flulaval, Fluvirin, Fluzone, Fluzone High-Dose, Fluzone Intradermal What should I tell my health care provider before I take this medicine? They need to know if you have any of these conditions:  bleeding disorder like hemophilia  fever or infection  Guillain-Barre syndrome or other  neurological problems  immune system problems  infection with the human immunodeficiency virus (HIV) or AIDS  low blood platelet counts  multiple sclerosis  an unusual or allergic reaction to influenza virus vaccine, latex, other medicines, foods, dyes, or preservatives. Different brands of vaccines contain different allergens. Some may contain latex or eggs. Talk to your doctor about your allergies to make sure that you get the right vaccine.  pregnant or trying to get pregnant  breast-feeding How should I use this medicine? This vaccine is for injection into a muscle or under the skin. It is given by a health care professional. A copy of Vaccine Information Statements will be given before each vaccination. Read this sheet carefully each time. The sheet may change frequently. Talk to your healthcare provider to see which vaccines are right for you. Some vaccines should not be used in all age groups. Overdosage: If you think you have taken too much of this medicine contact a poison control center or emergency room at once. NOTE: This medicine is only for you. Do not share this medicine with others. What if I miss a dose? This does not apply. What may interact with this medicine?  chemotherapy or radiation therapy  medicines that lower your immune system like etanercept, anakinra, infliximab, and adalimumab  medicines that treat or prevent blood clots like warfarin  phenytoin  steroid medicines like prednisone or cortisone  theophylline  vaccines This list may not describe all possible interactions. Give your health care provider a list of all the medicines, herbs, non-prescription drugs, or dietary supplements you use. Also tell them if you smoke, drink alcohol, or use illegal drugs. Some items may interact with your medicine. What should I  watch for while using this medicine? Report any side effects that do not go away within 3 days to your doctor or health care professional.  Call your health care provider if any unusual symptoms occur within 6 weeks of receiving this vaccine. You may still catch the flu, but the illness is not usually as bad. You cannot get the flu from the vaccine. The vaccine will not protect against colds or other illnesses that may cause fever. The vaccine is needed every year. What side effects may I notice from receiving this medicine? Side effects that you should report to your doctor or health care professional as soon as possible:  allergic reactions like skin rash, itching or hives, swelling of the face, lips, or tongue Side effects that usually do not require medical attention (report to your doctor or health care professional if they continue or are bothersome):  fever  headache  muscle aches and pains  pain, tenderness, redness, or swelling at the injection site  tiredness This list may not describe all possible side effects. Call your doctor for medical advice about side effects. You may report side effects to FDA at 1-800-FDA-1088. Where should I keep my medicine? The vaccine will be given by a health care professional in a clinic, pharmacy, doctor's office, or other health care setting. You will not be given vaccine doses to store at home. NOTE: This sheet is a summary. It may not cover all possible information. If you have questions about this medicine, talk to your doctor, pharmacist, or health care provider.  2020 Elsevier/Gold Standard (2018-10-24 08:45:43)

## 2019-10-03 NOTE — Telephone Encounter (Signed)
Scheduled appt per 10/21 sch message -- pt is aware of appt date and time   

## 2019-10-03 NOTE — Patient Instructions (Signed)

## 2019-10-04 ENCOUNTER — Telehealth: Payer: Self-pay | Admitting: Nurse Practitioner

## 2019-10-04 NOTE — Telephone Encounter (Signed)
No los per 10/21. °

## 2019-10-05 ENCOUNTER — Inpatient Hospital Stay: Payer: Medicare Other

## 2019-10-05 ENCOUNTER — Other Ambulatory Visit: Payer: Self-pay

## 2019-10-05 VITALS — BP 129/76 | HR 57 | Temp 98.3°F | Resp 18

## 2019-10-05 DIAGNOSIS — Z452 Encounter for adjustment and management of vascular access device: Secondary | ICD-10-CM | POA: Diagnosis not present

## 2019-10-05 DIAGNOSIS — C182 Malignant neoplasm of ascending colon: Secondary | ICD-10-CM

## 2019-10-05 MED ORDER — SODIUM CHLORIDE 0.9% FLUSH
10.0000 mL | INTRAVENOUS | Status: DC | PRN
  Administered 2019-10-05: 10 mL
  Filled 2019-10-05: qty 10

## 2019-10-05 MED ORDER — HEPARIN SOD (PORK) LOCK FLUSH 100 UNIT/ML IV SOLN
500.0000 [IU] | Freq: Once | INTRAVENOUS | Status: AC | PRN
  Administered 2019-10-05: 500 [IU]
  Filled 2019-10-05: qty 5

## 2019-10-09 ENCOUNTER — Inpatient Hospital Stay: Payer: Medicare Other

## 2019-10-09 ENCOUNTER — Other Ambulatory Visit: Payer: Self-pay

## 2019-10-09 DIAGNOSIS — D500 Iron deficiency anemia secondary to blood loss (chronic): Secondary | ICD-10-CM

## 2019-10-09 DIAGNOSIS — C182 Malignant neoplasm of ascending colon: Secondary | ICD-10-CM

## 2019-10-09 DIAGNOSIS — Z452 Encounter for adjustment and management of vascular access device: Secondary | ICD-10-CM | POA: Diagnosis not present

## 2019-10-09 DIAGNOSIS — Z95828 Presence of other vascular implants and grafts: Secondary | ICD-10-CM

## 2019-10-09 LAB — CMP (CANCER CENTER ONLY)
ALT: 27 U/L (ref 0–44)
AST: 25 U/L (ref 15–41)
Albumin: 4.2 g/dL (ref 3.5–5.0)
Alkaline Phosphatase: 76 U/L (ref 38–126)
Anion gap: 12 (ref 5–15)
BUN: 16 mg/dL (ref 8–23)
CO2: 27 mmol/L (ref 22–32)
Calcium: 9.8 mg/dL (ref 8.9–10.3)
Chloride: 101 mmol/L (ref 98–111)
Creatinine: 0.85 mg/dL (ref 0.61–1.24)
GFR, Est AFR Am: >60 mL/min (ref >60)
GFR, Estimated: >60 mL/min (ref >60)
Glucose, Bld: 87 mg/dL (ref 70–99)
Potassium: 4.4 mmol/L (ref 3.5–5.1)
Sodium: 140 mmol/L (ref 135–145)
Total Bilirubin: 1.9 mg/dL — ABNORMAL HIGH (ref 0.3–1.2)
Total Protein: 7.3 g/dL (ref 6.5–8.1)

## 2019-10-09 LAB — CBC WITH DIFFERENTIAL (CANCER CENTER ONLY)
Abs Immature Granulocytes: 0.01 10*3/uL (ref 0.00–0.07)
Basophils Absolute: 0 10*3/uL (ref 0.0–0.1)
Basophils Relative: 0 %
Eosinophils Absolute: 0 10*3/uL (ref 0.0–0.5)
Eosinophils Relative: 1 %
HCT: 43.8 % (ref 39.0–52.0)
Hemoglobin: 14.6 g/dL (ref 13.0–17.0)
Immature Granulocytes: 0 %
Lymphocytes Relative: 42 %
Lymphs Abs: 1.3 10*3/uL (ref 0.7–4.0)
MCH: 29.4 pg (ref 26.0–34.0)
MCHC: 33.3 g/dL (ref 30.0–36.0)
MCV: 88.1 fL (ref 80.0–100.0)
Monocytes Absolute: 0.2 10*3/uL (ref 0.1–1.0)
Monocytes Relative: 8 %
Neutro Abs: 1.5 10*3/uL — ABNORMAL LOW (ref 1.7–7.7)
Neutrophils Relative %: 49 %
Platelet Count: 115 10*3/uL — ABNORMAL LOW (ref 150–400)
RBC: 4.97 MIL/uL (ref 4.22–5.81)
RDW: 17.7 % — ABNORMAL HIGH (ref 11.5–15.5)
WBC Count: 3.1 10*3/uL — ABNORMAL LOW (ref 4.0–10.5)
nRBC: 0 % (ref 0.0–0.2)

## 2019-10-09 MED ORDER — SODIUM CHLORIDE 0.9% FLUSH
10.0000 mL | INTRAVENOUS | Status: DC | PRN
  Administered 2019-10-09: 10 mL
  Filled 2019-10-09: qty 10

## 2019-10-09 MED ORDER — HEPARIN SOD (PORK) LOCK FLUSH 100 UNIT/ML IV SOLN
500.0000 [IU] | Freq: Once | INTRAVENOUS | Status: AC | PRN
  Administered 2019-10-09: 500 [IU]
  Filled 2019-10-09: qty 5

## 2019-10-09 NOTE — Patient Instructions (Signed)

## 2019-10-10 NOTE — Progress Notes (Signed)
[place]   [phone] Fax:([phone]   Clinic Follow up Note   Patient Care Team: Lajean Manes, MD as PCP - General (Internal Medicine) Berle Mull, MD as Consulting Physician (Family Medicine) Clarene Essex, MD as Consulting Physician (Gastroenterology)  Date of Service:  10/17/2019  CHIEF COMPLAINT: F/u ofmetastaticcolon cancer  SUMMARY OF ONCOLOGIC HISTORY: Oncology History Overview Note  Cancer Staging Cancer of right colon [facility]) Staging form: Colon and Rectum, AJCC 8th Edition - Clinical stage from 04/09/2019: Stage IVC (cTX, cNX, pM1c) - Signed by Truitt Merle, MD on 05/03/2019     Cancer of right colon [facility])  04/09/2019 Procedure   Colonoscopy 04/09/19 by Dr Watt Climes IMPRESSION -internal hemorrhoids -Diverticulosis in the sigmoid colon  -2 small polyps in the rectum and in the proximal transverse colon, removed with a hot snare. Resected and retrieved.  -3 medium polyps in the proximal transverse colon, in the mid transverse colon and in the distal transverse colon, removed and resected and retrieved.  -likely malignant partially obstructing tumor at the hepatic flexure. biopsied, tattooed.  -1 large polyp in the mid ascending colon  -the examination was otherwise normal     04/09/2019 Initial Biopsy   FINAL MICROSCOPIC DIAGNOSIS: 04/09/19 1. LG intestine-hepatic flexure, Biopsy:   INVASIVE WELL DIFFERENTIATED ADENOCARCINOMA   04/09/2019 Cancer Staging   Staging form: Colon and Rectum, AJCC 8th Edition - Clinical stage from 04/09/2019: Stage IVC (cTX, cNX, pM1c) - Signed by Truitt Merle, MD on 05/03/2019   04/10/2019 Imaging   CT AP 04/10/19  IMPRESSION: 1. There is an eccentric mass of the colon involving the ascending colon near the hepatic flexure measuring approximately 3.4 x 3.4 by 2.0 cm (series 2, image 37, series 3, image 37). There is extensive soft tissue nodularity of the mesocolon and omentum, and likely areas of the  peritoneum, for example bilateral upper quadrants (series 2, image 25). Findings are consistent with primary colon malignancy, probable omental and peritoneal involvement, and small volume malignant ascites. 2.  Other chronic and incidental findings as detailed above.   04/20/2019 Initial Diagnosis   Cancer of right colon ([place])   04/26/2019 Imaging   CT Chest 04/26/19 IMPRESSION: 1. Borderline to mild lower thoracic adenopathy, including within the right internal mammary and juxta cardiophrenic stations. Given the appearance of the upper abdomen, suspicious for nodal metastasis. 2. A low right paratracheal node is borderline sized, but favored to be reactive. 3. No evidence of pulmonary metastasis. 4. Peritoneal metastasis and abdominal ascites, as before. 5. Pancreatic parenchymal calcifications indicative of chronic calcific pancreatitis. 6. Coronary artery atherosclerosis.   04/27/2019 Pathology Results   Diagnosis 04/27/19 Peritoneum, biopsy, right upper quadrant, perihepatic - ADENOCARCINOMA, CONSISTENT WITH COLONIC PRIMARY. - SEE COMMENT.   05/16/2019 -  Chemotherapy   First line FOLFOX every 2 weeks with Avastin starting with cycle 2 starting 05/16/19   07/23/2019 Imaging    CT AP W Contrast  IMPRESSION: 1. Primary ascending colon mass may be minimally smaller. Peritoneal metastatic disease appears slightly improved as well. 2. Chronic calcific pancreatitis. 3. Tiny left renal stone. 4. Small left inguinal hernia contains a knuckle of unobstructed colon. 5. Enlarged prostate.      CURRENT THERAPY:  First lineFOLFOX every 2 weeks with Avastin starting with cycle 2starting 05/16/19. Oxaliplatin held C8and then reduced starting C9due toneurotoxicityand thrombocytopenia. Oxaliplatin held since C11 due to thrombocytopenia.   INTERVAL HISTORY:  Nathaniel Norton is here for a follow up and treatment. She presents to the  clinic alone. He notes he is doing well. He notes he  had felt no major change without Oxaliplatin with C11. He notes his neuropathy is stable of hands and still dropping things. He denies numbness of toes. He notes posterior gum irritation with last cycle. He was seen by dentist before that treatment when that irritation started. Now this has resoled. He notes his taste change improved on week of chemo. He notes his urine may be more yellow when not drink as much but otherwise normal. He denies LE edema or pain.    REVIEW OF SYSTEMS:   Constitutional: Denies fevers, chills or abnormal weight loss Eyes: Denies blurriness of vision Ears, nose, mouth, throat, and face: Denies mucositis or sore throat Respiratory: Denies cough, dyspnea or wheezes Cardiovascular: Denies palpitation, chest discomfort or lower extremity swelling Gastrointestinal:  Denies nausea, heartburn or change in bowel habits Skin: Denies abnormal skin rashes Lymphatics: Denies new lymphadenopathy or easy bruising Neurological: (+) Neuropathy, mainly in hands stable  Behavioral/Psych: Mood is stable, no new changes  All other systems were reviewed with the patient and are negative.  MEDICAL HISTORY:  Past Medical History:  Diagnosis Date  . colon ca dx'd 03/2019    SURGICAL HISTORY: Past Surgical History:  Procedure Laterality Date  . CATARACT EXTRACTION    . IR IMAGING GUIDED PORT INSERTION  05/10/2019  . L4 fracture  2012  . RETINAL DETACHMENT SURGERY    . Right hand surgery  1974    I have reviewed the social history and family history with the patient and they are unchanged from previous note.  ALLERGIES:  is allergic to feraheme [ferumoxytol] and codeine.  MEDICATIONS:  Current Outpatient Medications  Medication Sig Dispense Refill  . ferrous sulfate 325 (65 FE) MG tablet Take 325 mg by mouth daily with breakfast.    . lidocaine-prilocaine (EMLA) cream Apply to affected area once 30 g 3  . Multiple Vitamin (MULTIVITAMIN) tablet Take 1 tablet by mouth daily.     . ondansetron (ZOFRAN) 8 MG tablet Take 1 tablet (8 mg total) by mouth 2 (two) times daily as needed for refractory nausea / vomiting. Start on day 3 after chemotherapy. 30 tablet 1  . prochlorperazine (COMPAZINE) 10 MG tablet Take 1 tablet (10 mg total) by mouth every 6 (six) hours as needed (Nausea or vomiting). 30 tablet 1   No current facility-administered medications for this visit.     PHYSICAL EXAMINATION: ECOG PERFORMANCE STATUS: 1 - Symptomatic but completely ambulatory  Vitals:   10/17/19 0840  BP: (!) 141/85  Pulse: (!) 59  Resp: 18  Temp: 98 F (36.7 C)  SpO2: 100%   Filed Weights   10/17/19 0840  Weight: 163 lb 12.8 oz (74.3 kg)    GENERAL:alert, no distress and comfortable SKIN: skin color, texture, turgor are normal, no rashes or significant lesions EYES: normal, Conjunctiva are pink and non-injected, sclera clear  NECK: supple, thyroid normal size, non-tender, without nodularity LYMPH:  no palpable lymphadenopathy in the cervical, axillary  LUNGS: clear to auscultation and percussion with normal breathing effort HEART: regular rate & rhythm and no murmurs and no lower extremity edema ABDOMEN:abdomen soft, non-tender and normal bowel sounds Musculoskeletal:no cyanosis of digits and no clubbing  NEURO: alert & oriented x 3 with fluent speech, no focal motor/sensory deficits  LABORATORY DATA:  I have reviewed the data as listed CBC Latest Ref Rng & Units 10/17/2019 10/09/2019 10/03/2019  WBC 4.0 - 10.5 K/uL 4.3 3.1(L) 3.2(L)  Hemoglobin 13.0 - 17.0 g/dL 14.2 14.6 12.9(L)  Hematocrit 39.0 - 52.0 % 41.9 43.8 39.4  Platelets 150 - 400 K/uL 85(L) 115(L) 79(L)     CMP Latest Ref Rng & Units 10/17/2019 10/09/2019 10/03/2019  Glucose 70 - 99 mg/dL 96 87 113(H)  BUN 8 - 23 mg/dL _0 Creatinine 0.61 - 1.24 mg/dL 0.99 0.85 0.85  Sodium 135 - 145 mmol/L 142 140 142  Potassium 3.5 - 5.1 mmol/L 4.1 4.4 3.8  Chloride 98 - 111 mmol/L 106 101 106  CO2 22 - 32  mmol/L _1 Calcium 8.9 - 10.3 mg/dL 9.7 9.8 9.2  Total Protein 6.5 - 8.1 g/dL 7.3 7.3 6.5  Total Bilirubin 0.3 - 1.2 mg/dL 1.0 1.9(H) 2.0(H)  Alkaline Phos 38 - 126 U/L 86 76 73  AST 15 - 41 U/L _2 ALT 0 - 44 U/L _3 RADIOGRAPHIC STUDIES: I have personally reviewed the radiological images as listed and agreed with the findings in the report. No results found.   ASSESSMENT & PLAN:  Nathaniel Norton is a 68 y.o. male with   1. Cancer of right colon,with peritoneal metastasis,stage IV,MMR normal, KRAS mutation (+), MSS -He wasrecentlydiagnosed in 03/2019. His Colonoscopy biopsy showed adenocarcinoma of right hepatic flexurecolon.His peritoneal biopsy from 04/27/19 showsadenocarcinoma,consistent withmetastasis of his colon cancer. This is now stage IV disease which is no longer curable but still treatable.  -He startedfirst line FOLFOX q2weekson6/3/20, Avastin added with cycle 2.Dose moderately reduce starting with C9 due to neuropathy. Oxaliplatin held from [facility] due to thrombocytopenia.  -I will likely stop Oxaliplatin in a few months given neuropathy. Will likely proceed with maintenance therapy after next scan and continue for as long as disease is controlled. I discussed surgery is still his best option for cancer treatment and encouraged him to consider. He understands.  -He will be seen by Dr. Clovis Riley to further discuss [place] surgery on 11/02/19.  -S/p C11 he feels stable, including neuropathy, and noticed no change without Oxaliplatin. Per patient he has not noticed concerning urine color change.  -Labs reviewed, plt 85K, tbili 1.0. Overall adequate to proceed with C12 chemo with 5-fu/LV and Avastin, will continue holding Oxaliplatin due to thrombocytopenia.  -Plan for next scan on 11/05/19. Given visit with Dr Clovis Riley and recent hyperbilirubinemia, I will try to move scan a week earlier, he is agreeable.  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 Watt Climes showed internal hemorrhoids, benign polyps, diverticulosis and colon cancer.  -Since 03/2019 he has been on OTC multivitamin and oral iron in late 03/2019.  -He has been treated with1doses of IV Feraheme on 05/03/19 and 05/16/19. He did have reaction to 2nd infusionand infusion was stopped.Will give another type of iron in the future if needed.  -Anemiarecentlyresolved.   3. Elevated PSA  -PSA was 5.7 in 11/2018 and 4.6 in 01/2019 -He plans to wait on seeing Urologist for work up given colon cancer diagnosis. Thisis understandable.   4. Social Support -He is single with no children and no family that [place]. -He lives alone in 1 level [employer]. -He does not have Internet access at home. He does have a cell phone which he can be reached. -If he needs help with transportation for treatment, I discussed our social workers can help him.  -I encouraged him to  find someone to help him as needed at home.  5.Goal of care discussion  -The patient understands the goal of care is palliative. -he is full code now  6. Taste change, lower appetite, Constipation -Secondary to chemo -He is on Ensure. Will monitorand f/u with dietician as needed. -With smaller frequent meals he is able to maintain weight.Stable. -Constipation controled on Miralax or Ducolax. Stable.   7. Mild neuropathy  -S/p C7 FOLFOX, mainly in hands.Most cold related neuropathy.  -Oxaliplatin dose reduced more than 50% starting with C9. Has mildly improved with chemo dose reduction.  -May change to maintenance Xeloda in the near future.Stable and mainly in hands, no numbness of toes.  8. Thrombocytopenia, Hyperbilirubinemia  -S/p C10 his plt significantly decreased to 79K and tbili newly increased to 2.0 (10/03/19). Oxaliplatin held C11.  -plt 85K today and tbili 1.0 today (10/17/19). Will continue  to hold Oxaliplatin for C12.    PLAN: -Labs reviewed and adequate to proceed with C12 5FU/LV and Avastin today, will hold oxaliplatin due to thrombocytopenia -Lab, flush, f/u and chemo in 2 and 4 weeks  -will move his CT AP W Contrast  From11/23/20 to a week earlier  -He is scheduled to see Dr. Clovis Riley on 11/20    No problem-specific Assessment & Plan notes found for this encounter.   No orders of the defined types were placed in this encounter.  All questions were answered. The patient knows to call the clinic with any problems, questions or concerns. No barriers to learning was detected. I spent 25 minutes counseling the patient face to face. The total time spent in the appointment was 30 minutes and more than 50% was on counseling and review of test results     Truitt Merle, MD 10/17/2019   I, Joslyn Devon, am acting as scribe for Truitt Merle, MD.   I have reviewed the above documentation for accuracy and completeness, and I agree with the above.

## 2019-10-11 ENCOUNTER — Telehealth: Payer: Self-pay

## 2019-10-11 NOTE — Telephone Encounter (Signed)
Called patient and left voicemail to let him know the results of his recent lab work as well as results from last scan in August per Lacie Burton-NP. Reminded him to keep upcoming appointments on 10/17/19, and encouraged him to call clinic back if he had any questions or need any further clarification.

## 2019-10-11 NOTE — Telephone Encounter (Signed)
-----   Message from Alla Feeling, NP sent at 10/11/2019  1:52 PM EDT ----- Could one of you please call Nathaniel Norton and let him know labs. I had him return this week to check bili, which is still mildly elevated. Other LFTs are normal. The liver was unremarkable on last scan in 07/2019. Have him monitor for juandice, dark urine, or RUQ pain. Keep next appointments.  Thanks, Regan Rakers

## 2019-10-17 ENCOUNTER — Telehealth: Payer: Self-pay

## 2019-10-17 ENCOUNTER — Other Ambulatory Visit: Payer: Self-pay

## 2019-10-17 ENCOUNTER — Inpatient Hospital Stay (HOSPITAL_BASED_OUTPATIENT_CLINIC_OR_DEPARTMENT_OTHER): Payer: Medicare Other | Admitting: Hematology

## 2019-10-17 ENCOUNTER — Encounter: Payer: Self-pay | Admitting: Hematology

## 2019-10-17 ENCOUNTER — Inpatient Hospital Stay: Payer: Medicare Other | Attending: Hematology

## 2019-10-17 ENCOUNTER — Inpatient Hospital Stay: Payer: Medicare Other

## 2019-10-17 VITALS — BP 160/86 | HR 55

## 2019-10-17 VITALS — BP 141/85 | HR 59 | Temp 98.0°F | Resp 18 | Ht 66.0 in | Wt 163.8 lb

## 2019-10-17 DIAGNOSIS — R972 Elevated prostate specific antigen [PSA]: Secondary | ICD-10-CM | POA: Diagnosis not present

## 2019-10-17 DIAGNOSIS — Z5112 Encounter for antineoplastic immunotherapy: Secondary | ICD-10-CM | POA: Insufficient documentation

## 2019-10-17 DIAGNOSIS — D500 Iron deficiency anemia secondary to blood loss (chronic): Secondary | ICD-10-CM

## 2019-10-17 DIAGNOSIS — Z452 Encounter for adjustment and management of vascular access device: Secondary | ICD-10-CM | POA: Diagnosis present

## 2019-10-17 DIAGNOSIS — Z79899 Other long term (current) drug therapy: Secondary | ICD-10-CM | POA: Diagnosis not present

## 2019-10-17 DIAGNOSIS — C182 Malignant neoplasm of ascending colon: Secondary | ICD-10-CM | POA: Diagnosis not present

## 2019-10-17 DIAGNOSIS — K5900 Constipation, unspecified: Secondary | ICD-10-CM | POA: Diagnosis not present

## 2019-10-17 DIAGNOSIS — C786 Secondary malignant neoplasm of retroperitoneum and peritoneum: Secondary | ICD-10-CM | POA: Diagnosis not present

## 2019-10-17 DIAGNOSIS — Z5111 Encounter for antineoplastic chemotherapy: Secondary | ICD-10-CM | POA: Diagnosis present

## 2019-10-17 DIAGNOSIS — D696 Thrombocytopenia, unspecified: Secondary | ICD-10-CM | POA: Diagnosis not present

## 2019-10-17 DIAGNOSIS — G620 Drug-induced polyneuropathy: Secondary | ICD-10-CM | POA: Insufficient documentation

## 2019-10-17 DIAGNOSIS — Z95828 Presence of other vascular implants and grafts: Secondary | ICD-10-CM

## 2019-10-17 LAB — CMP (CANCER CENTER ONLY)
ALT: 22 U/L (ref 0–44)
AST: 24 U/L (ref 15–41)
Albumin: 4 g/dL (ref 3.5–5.0)
Alkaline Phosphatase: 86 U/L (ref 38–126)
Anion gap: 14 (ref 5–15)
BUN: 10 mg/dL (ref 8–23)
CO2: 22 mmol/L (ref 22–32)
Calcium: 9.7 mg/dL (ref 8.9–10.3)
Chloride: 106 mmol/L (ref 98–111)
Creatinine: 0.99 mg/dL (ref 0.61–1.24)
GFR, Est AFR Am: >60 mL/min (ref >60)
GFR, Estimated: >60 mL/min (ref >60)
Glucose, Bld: 96 mg/dL (ref 70–99)
Potassium: 4.1 mmol/L (ref 3.5–5.1)
Sodium: 142 mmol/L (ref 135–145)
Total Bilirubin: 1 mg/dL (ref 0.3–1.2)
Total Protein: 7.3 g/dL (ref 6.5–8.1)

## 2019-10-17 LAB — IRON AND TIBC
Iron: 157 ug/dL (ref 42–163)
Saturation Ratios: 53 % (ref 20–55)
TIBC: 294 ug/dL (ref 202–409)
UIBC: 137 ug/dL (ref 117–376)

## 2019-10-17 LAB — CBC WITH DIFFERENTIAL (CANCER CENTER ONLY)
Abs Immature Granulocytes: 0.01 10*3/uL (ref 0.00–0.07)
Basophils Absolute: 0 10*3/uL (ref 0.0–0.1)
Basophils Relative: 1 %
Eosinophils Absolute: 0.1 10*3/uL (ref 0.0–0.5)
Eosinophils Relative: 2 %
HCT: 41.9 % (ref 39.0–52.0)
Hemoglobin: 14.2 g/dL (ref 13.0–17.0)
Immature Granulocytes: 0 %
Lymphocytes Relative: 28 %
Lymphs Abs: 1.2 10*3/uL (ref 0.7–4.0)
MCH: 29.8 pg (ref 26.0–34.0)
MCHC: 33.9 g/dL (ref 30.0–36.0)
MCV: 87.8 fL (ref 80.0–100.0)
Monocytes Absolute: 0.4 10*3/uL (ref 0.1–1.0)
Monocytes Relative: 10 %
Neutro Abs: 2.6 10*3/uL (ref 1.7–7.7)
Neutrophils Relative %: 59 %
Platelet Count: 85 10*3/uL — ABNORMAL LOW (ref 150–400)
RBC: 4.77 MIL/uL (ref 4.22–5.81)
RDW: 16.9 % — ABNORMAL HIGH (ref 11.5–15.5)
WBC Count: 4.3 10*3/uL (ref 4.0–10.5)
nRBC: 0 % (ref 0.0–0.2)

## 2019-10-17 LAB — CEA (IN HOUSE-CHCC): CEA (CHCC-In House): 1.36 ng/mL (ref 0.00–5.00)

## 2019-10-17 LAB — TOTAL PROTEIN, URINE DIPSTICK: Protein, ur: NEGATIVE mg/dL

## 2019-10-17 LAB — FERRITIN: Ferritin: 116 ng/mL (ref 24–336)

## 2019-10-17 MED ORDER — SODIUM CHLORIDE 0.9% FLUSH
10.0000 mL | INTRAVENOUS | Status: DC | PRN
  Administered 2019-10-17: 08:00:00 10 mL
  Filled 2019-10-17: qty 10

## 2019-10-17 MED ORDER — PALONOSETRON HCL INJECTION 0.25 MG/5ML
0.2500 mg | Freq: Once | INTRAVENOUS | Status: AC
  Administered 2019-10-17: 0.25 mg via INTRAVENOUS

## 2019-10-17 MED ORDER — PALONOSETRON HCL INJECTION 0.25 MG/5ML
INTRAVENOUS | Status: AC
  Filled 2019-10-17: qty 5

## 2019-10-17 MED ORDER — SODIUM CHLORIDE 0.9 % IV SOLN
5.0000 mg/kg | Freq: Once | INTRAVENOUS | Status: AC
  Administered 2019-10-17: 400 mg via INTRAVENOUS
  Filled 2019-10-17: qty 16

## 2019-10-17 MED ORDER — DEXAMETHASONE SODIUM PHOSPHATE 10 MG/ML IJ SOLN
INTRAMUSCULAR | Status: AC
  Filled 2019-10-17: qty 1

## 2019-10-17 MED ORDER — SODIUM CHLORIDE 0.9 % IV SOLN
2000.0000 mg/m2 | INTRAVENOUS | Status: DC
  Administered 2019-10-17: 3800 mg via INTRAVENOUS
  Filled 2019-10-17: qty 76

## 2019-10-17 MED ORDER — LEUCOVORIN CALCIUM INJECTION 350 MG
400.0000 mg/m2 | Freq: Once | INTRAVENOUS | Status: AC
  Administered 2019-10-17: 756 mg via INTRAVENOUS
  Filled 2019-10-17: qty 37.8

## 2019-10-17 MED ORDER — DEXAMETHASONE SODIUM PHOSPHATE 10 MG/ML IJ SOLN
10.0000 mg | Freq: Once | INTRAMUSCULAR | Status: AC
  Administered 2019-10-17: 10 mg via INTRAVENOUS

## 2019-10-17 MED ORDER — SODIUM CHLORIDE 0.9 % IV SOLN
INTRAVENOUS | Status: DC
  Administered 2019-10-17: 10:00:00 via INTRAVENOUS
  Filled 2019-10-17: qty 250

## 2019-10-17 MED ORDER — DEXTROSE 5 % IV SOLN
Freq: Once | INTRAVENOUS | Status: AC
  Administered 2019-10-17: 10:00:00 via INTRAVENOUS
  Filled 2019-10-17: qty 250

## 2019-10-17 NOTE — Telephone Encounter (Signed)
Spoke with patient in Infusion area regarding rescheduling his CT scan to prior to seeing Dr. Clovis Riley. Informed him have it rescheduled for Monday 11/16 at 2:30 at [facility], arrive by 2:15, NPO after 10:30 am, drink first bottle of oral prep at 12:30 and second at 1:30.  The patient verbalized an understanding.

## 2019-10-17 NOTE — Progress Notes (Signed)
Per Dr. Burr Medico: OK to treat with platelets of 85,000 but will hold Oxaliplatin from treatment plan. OK to release treatment plan as long as patient gives urine specimen before he leaves today  11:49 Urine sample collected and urine protein negative

## 2019-10-17 NOTE — Patient Instructions (Signed)
[place] Discharge Instructions for Patients Receiving Chemotherapy  Today you received the following chemotherapy agents: Bevacizumab-bvzr Noah Charon), Leucovorin, and Fluorouracil (Adrucil, 5-FU)  To help prevent nausea and vomiting after your treatment, we encourage you to take your nausea medication as directed.    If you develop nausea and vomiting that is not controlled by your nausea medication, call the clinic.   BELOW ARE SYMPTOMS THAT SHOULD BE REPORTED IMMEDIATELY:  *FEVER GREATER THAN 100.5 F  *CHILLS WITH OR WITHOUT FEVER  NAUSEA AND VOMITING THAT IS NOT CONTROLLED WITH YOUR NAUSEA MEDICATION  *UNUSUAL SHORTNESS OF BREATH  *UNUSUAL BRUISING OR BLEEDING  TENDERNESS IN MOUTH AND THROAT WITH OR WITHOUT PRESENCE OF ULCERS  *URINARY PROBLEMS  *BOWEL PROBLEMS  UNUSUAL RASH Items with * indicate a potential emergency and should be followed up as soon as possible.  Feel free to call the clinic should you have any questions or concerns. The clinic phone number is [phone].  Please show the [place] at check-in to the Emergency Department and triage nurse.  Coronavirus (COVID-19) Are you at risk?  Are you at risk for the Coronavirus (COVID-19)?  To be considered HIGH RISK for Coronavirus (COVID-19), you have to meet the following criteria:  . Traveled to [place], [place], [place], [place] or [place]; or in the [place] to [place], [place], [place], or [place]; and have fever, cough, and shortness of breath within the last 2 weeks of travel OR . Been in close contact with a person diagnosed with COVID-19 within the last 2 weeks and have fever, cough, and shortness of breath . IF YOU DO NOT MEET THESE CRITERIA, YOU ARE CONSIDERED LOW RISK FOR COVID-19.  What to do if you are HIGH RISK for COVID-19?  Marland Kitchen If you are having a medical emergency, call 911. . Seek medical care right away. Before you go to a doctor's office, urgent care  or emergency department, call ahead and tell them about your recent travel, contact with someone diagnosed with COVID-19, and your symptoms. You should receive instructions from your physician's office regarding next steps of care.  . When you arrive at healthcare provider, tell the healthcare staff immediately you have returned from visiting [place], [place], [place], [place] or [place]; or traveled in the [place] to [place], [place], [place], or [place]; in the last two weeks or you have been in close contact with a person diagnosed with COVID-19 in the last 2 weeks.   . Tell the health care staff about your symptoms: fever, cough and shortness of breath. . After you have been seen by a medical provider, you will be either: o Tested for (COVID-19) and discharged home on quarantine except to seek medical care if symptoms worsen, and asked to  - Stay home and avoid contact with others until you get your results (4-5 days)  - Avoid travel on public transportation if possible (such as bus, train, or airplane) or o Sent to the Emergency Department by EMS for evaluation, COVID-19 testing, and possible admission depending on your condition and test results.  What to do if you are LOW RISK for COVID-19?  Reduce your risk of any infection by using the same precautions used for avoiding the common cold or flu:  Marland Kitchen Wash your hands often with soap and warm water for at least 20 seconds.  If soap and water are not readily available, use an alcohol-based hand sanitizer with at least 60%  alcohol.  . If coughing or sneezing, cover your mouth and nose by coughing or sneezing into the elbow areas of your shirt or coat, into a tissue or into your sleeve (not your hands). . Avoid shaking hands with others and consider head nods or verbal greetings only. . Avoid touching your eyes, nose, or mouth with unwashed hands.  . Avoid close contact with people who are sick. . Avoid places or events with large  numbers of people in one location, like concerts or sporting events. . Carefully consider travel plans you have or are making. . If you are planning any travel outside or inside the [place], visit the CDC's Travelers' Health webpage for the latest health notices. . If you have some symptoms but not all symptoms, continue to monitor at home and seek medical attention if your symptoms worsen. . If you are having a medical emergency, call 911.   [place] / e-Visit: [email or web address]         [facility]: [facility]: [phone]                   [facility]: [phone]

## 2019-10-19 ENCOUNTER — Other Ambulatory Visit: Payer: Self-pay

## 2019-10-19 ENCOUNTER — Inpatient Hospital Stay: Payer: Medicare Other

## 2019-10-19 VITALS — BP 141/87 | HR 59 | Temp 98.0°F | Resp 16

## 2019-10-19 DIAGNOSIS — Z452 Encounter for adjustment and management of vascular access device: Secondary | ICD-10-CM | POA: Diagnosis not present

## 2019-10-19 DIAGNOSIS — C182 Malignant neoplasm of ascending colon: Secondary | ICD-10-CM

## 2019-10-19 MED ORDER — HEPARIN SOD (PORK) LOCK FLUSH 100 UNIT/ML IV SOLN
500.0000 [IU] | Freq: Once | INTRAVENOUS | Status: AC | PRN
  Administered 2019-10-19: 500 [IU]
  Filled 2019-10-19: qty 5

## 2019-10-19 MED ORDER — SODIUM CHLORIDE 0.9% FLUSH
10.0000 mL | INTRAVENOUS | Status: DC | PRN
  Administered 2019-10-19: 10 mL
  Filled 2019-10-19: qty 10

## 2019-10-19 NOTE — Patient Instructions (Signed)

## 2019-10-29 ENCOUNTER — Ambulatory Visit (HOSPITAL_COMMUNITY)
Admission: RE | Admit: 2019-10-29 | Discharge: 2019-10-29 | Disposition: A | Discharge status: Home / Self Care | Payer: Medicare Other | Source: Ambulatory Visit | Attending: Nurse Practitioner | Admitting: Nurse Practitioner

## 2019-10-29 ENCOUNTER — Other Ambulatory Visit: Payer: Self-pay

## 2019-10-29 ENCOUNTER — Encounter (HOSPITAL_COMMUNITY): Payer: Self-pay

## 2019-10-29 DIAGNOSIS — D490 Neoplasm of unspecified behavior of digestive system: Secondary | ICD-10-CM | POA: Diagnosis present

## 2019-10-29 MED ORDER — SODIUM CHLORIDE (PF) 0.9 % IJ SOLN
INTRAMUSCULAR | Status: AC
  Filled 2019-10-29: qty 50

## 2019-10-29 MED ORDER — IOHEXOL 300 MG/ML  SOLN
100.0000 mL | Freq: Once | INTRAMUSCULAR | Status: AC | PRN
  Administered 2019-10-29: 100 mL via INTRAVENOUS

## 2019-10-29 MED ORDER — HEPARIN SOD (PORK) LOCK FLUSH 100 UNIT/ML IV SOLN
INTRAVENOUS | Status: AC
  Administered 2019-10-29: 500 [IU] via INTRAVENOUS
  Filled 2019-10-29: qty 5

## 2019-10-29 MED ORDER — HEPARIN SOD (PORK) LOCK FLUSH 100 UNIT/ML IV SOLN
500.0000 [IU] | Freq: Once | INTRAVENOUS | Status: AC
  Administered 2019-10-29: 15:00:00 500 [IU] via INTRAVENOUS

## 2019-10-31 ENCOUNTER — Inpatient Hospital Stay: Payer: Medicare Other

## 2019-10-31 ENCOUNTER — Inpatient Hospital Stay (HOSPITAL_BASED_OUTPATIENT_CLINIC_OR_DEPARTMENT_OTHER): Payer: Medicare Other | Admitting: Hematology

## 2019-10-31 ENCOUNTER — Encounter: Payer: Self-pay | Admitting: Hematology

## 2019-10-31 ENCOUNTER — Other Ambulatory Visit: Payer: Self-pay

## 2019-10-31 VITALS — BP 143/88 | HR 55 | Temp 98.0°F | Resp 17 | Ht 66.0 in | Wt 165.3 lb

## 2019-10-31 DIAGNOSIS — C182 Malignant neoplasm of ascending colon: Secondary | ICD-10-CM

## 2019-10-31 DIAGNOSIS — D500 Iron deficiency anemia secondary to blood loss (chronic): Secondary | ICD-10-CM

## 2019-10-31 DIAGNOSIS — Z7189 Other specified counseling: Secondary | ICD-10-CM

## 2019-10-31 DIAGNOSIS — Z452 Encounter for adjustment and management of vascular access device: Secondary | ICD-10-CM | POA: Diagnosis not present

## 2019-10-31 DIAGNOSIS — Z95828 Presence of other vascular implants and grafts: Secondary | ICD-10-CM

## 2019-10-31 LAB — CMP (CANCER CENTER ONLY)
ALT: 25 U/L (ref 0–44)
AST: 24 U/L (ref 15–41)
Albumin: 4 g/dL (ref 3.5–5.0)
Alkaline Phosphatase: 97 U/L (ref 38–126)
Anion gap: 10 (ref 5–15)
BUN: 11 mg/dL (ref 8–23)
CO2: 26 mmol/L (ref 22–32)
Calcium: 9.3 mg/dL (ref 8.9–10.3)
Chloride: 105 mmol/L (ref 98–111)
Creatinine: 0.93 mg/dL (ref 0.61–1.24)
GFR, Est AFR Am: >60 mL/min (ref >60)
GFR, Estimated: >60 mL/min (ref >60)
Glucose, Bld: 83 mg/dL (ref 70–99)
Potassium: 4.3 mmol/L (ref 3.5–5.1)
Sodium: 141 mmol/L (ref 135–145)
Total Bilirubin: 1.5 mg/dL — ABNORMAL HIGH (ref 0.3–1.2)
Total Protein: 7 g/dL (ref 6.5–8.1)

## 2019-10-31 LAB — CBC WITH DIFFERENTIAL (CANCER CENTER ONLY)
Abs Immature Granulocytes: 0 10*3/uL (ref 0.00–0.07)
Basophils Absolute: 0 10*3/uL (ref 0.0–0.1)
Basophils Relative: 0 %
Eosinophils Absolute: 0.1 10*3/uL (ref 0.0–0.5)
Eosinophils Relative: 3 %
HCT: 43.1 % (ref 39.0–52.0)
Hemoglobin: 14.1 g/dL (ref 13.0–17.0)
Immature Granulocytes: 0 %
Lymphocytes Relative: 23 %
Lymphs Abs: 1 10*3/uL (ref 0.7–4.0)
MCH: 29.7 pg (ref 26.0–34.0)
MCHC: 32.7 g/dL (ref 30.0–36.0)
MCV: 90.7 fL (ref 80.0–100.0)
Monocytes Absolute: 0.5 10*3/uL (ref 0.1–1.0)
Monocytes Relative: 11 %
Neutro Abs: 2.8 10*3/uL (ref 1.7–7.7)
Neutrophils Relative %: 63 %
Platelet Count: 86 10*3/uL — ABNORMAL LOW (ref 150–400)
RBC: 4.75 MIL/uL (ref 4.22–5.81)
RDW: 15.8 % — ABNORMAL HIGH (ref 11.5–15.5)
WBC Count: 4.4 10*3/uL (ref 4.0–10.5)
nRBC: 0 % (ref 0.0–0.2)

## 2019-10-31 MED ORDER — SODIUM CHLORIDE 0.9 % IV SOLN
5.0000 mg/kg | Freq: Once | INTRAVENOUS | Status: AC
  Administered 2019-10-31: 400 mg via INTRAVENOUS
  Filled 2019-10-31: qty 16

## 2019-10-31 MED ORDER — SODIUM CHLORIDE 0.9 % IV SOLN
INTRAVENOUS | Status: DC
  Administered 2019-10-31: 11:00:00 via INTRAVENOUS
  Filled 2019-10-31: qty 250

## 2019-10-31 MED ORDER — PALONOSETRON HCL INJECTION 0.25 MG/5ML
0.2500 mg | Freq: Once | INTRAVENOUS | Status: AC
  Administered 2019-10-31: 0.25 mg via INTRAVENOUS

## 2019-10-31 MED ORDER — LEUCOVORIN CALCIUM INJECTION 350 MG
400.0000 mg/m2 | Freq: Once | INTRAVENOUS | Status: AC
  Administered 2019-10-31: 756 mg via INTRAVENOUS
  Filled 2019-10-31: qty 37.8

## 2019-10-31 MED ORDER — SODIUM CHLORIDE 0.9% FLUSH
10.0000 mL | INTRAVENOUS | Status: DC | PRN
  Administered 2019-10-31: 10 mL
  Filled 2019-10-31: qty 10

## 2019-10-31 MED ORDER — DEXAMETHASONE SODIUM PHOSPHATE 10 MG/ML IJ SOLN
INTRAMUSCULAR | Status: AC
  Filled 2019-10-31: qty 1

## 2019-10-31 MED ORDER — PALONOSETRON HCL INJECTION 0.25 MG/5ML
INTRAVENOUS | Status: AC
  Filled 2019-10-31: qty 5

## 2019-10-31 MED ORDER — DEXAMETHASONE SODIUM PHOSPHATE 10 MG/ML IJ SOLN
10.0000 mg | Freq: Once | INTRAMUSCULAR | Status: AC
  Administered 2019-10-31: 11:00:00 10 mg via INTRAVENOUS

## 2019-10-31 MED ORDER — SODIUM CHLORIDE 0.9 % IV SOLN
2000.0000 mg/m2 | INTRAVENOUS | Status: DC
  Administered 2019-10-31: 3800 mg via INTRAVENOUS
  Filled 2019-10-31: qty 76

## 2019-10-31 NOTE — Progress Notes (Signed)
Verbal order from Dr Burr Medico: "okay to treat with Plt of 86. Hold Oxaliplatin today."

## 2019-10-31 NOTE — Progress Notes (Signed)
Nathaniel Norton    [phone] [phone]   Clinic Follow up Note   Patient Care Team: Lajean Manes, MD as PCP - General (Internal Medicine) Berle Mull, MD as Consulting Physician (Family Medicine) Clarene Essex, MD as Consulting Physician (Gastroenterology)  Date of Service:  10/31/2019  CHIEF COMPLAINT:  F/u ofmetastaticcolon cancer  SUMMARY OF ONCOLOGIC HISTORY: Oncology History Overview Note  Cancer Staging Cancer of right colon [facility]) Staging form: Colon and Rectum, AJCC 8th Edition - Clinical stage from 04/09/2019: Stage IVC (cTX, cNX, pM1c) - Signed by Truitt Merle, MD on 05/03/2019     Cancer of right colon [facility])  04/09/2019 Procedure   Colonoscopy 04/09/19 by Dr Watt Climes IMPRESSION -internal hemorrhoids -Diverticulosis in the sigmoid colon  -2 small polyps in the rectum and in the proximal transverse colon, removed with a hot snare. Resected and retrieved.  -3 medium polyps in the proximal transverse colon, in the mid transverse colon and in the distal transverse colon, removed and resected and retrieved.  -likely malignant partially obstructing tumor at the hepatic flexure. biopsied, tattooed.  -1 large polyp in the mid ascending colon  -the examination was otherwise normal     04/09/2019 Initial Biopsy   FINAL MICROSCOPIC DIAGNOSIS: 04/09/19 1. LG intestine-hepatic flexure, Biopsy:   INVASIVE WELL DIFFERENTIATED ADENOCARCINOMA   04/09/2019 Cancer Staging   Staging form: Colon and Rectum, AJCC 8th Edition - Clinical stage from 04/09/2019: Stage IVC (cTX, cNX, pM1c) - Signed by Truitt Merle, MD on 05/03/2019   04/10/2019 Imaging   CT AP 04/10/19  IMPRESSION: 1. There is an eccentric mass of the colon involving the ascending colon near the hepatic flexure measuring approximately 3.4 x 3.4 by 2.0 cm (series 2, image 37, series 3, image 37). There is extensive soft tissue nodularity of the mesocolon and omentum, and likely areas of the  peritoneum, for example bilateral upper quadrants (series 2, image 25). Findings are consistent with primary colon malignancy, probable omental and peritoneal involvement, and small volume malignant ascites. 2.  Other chronic and incidental findings as detailed above.   04/20/2019 Initial Diagnosis   Cancer of right colon ([place])   04/26/2019 Imaging   CT Chest 04/26/19 IMPRESSION: 1. Borderline to mild lower thoracic adenopathy, including within the right internal mammary and juxta cardiophrenic stations. Given the appearance of the upper abdomen, suspicious for nodal metastasis. 2. A low right paratracheal node is borderline sized, but favored to be reactive. 3. No evidence of pulmonary metastasis. 4. Peritoneal metastasis and abdominal ascites, as before. 5. Pancreatic parenchymal calcifications indicative of chronic calcific pancreatitis. 6. Coronary artery atherosclerosis.   04/27/2019 Pathology Results   Diagnosis 04/27/19 Peritoneum, biopsy, right upper quadrant, perihepatic - ADENOCARCINOMA, CONSISTENT WITH COLONIC PRIMARY. - SEE COMMENT.   05/16/2019 -  Chemotherapy   First line FOLFOX every 2 weeks with Avastin starting with cycle 2 starting 05/16/19. 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    07/23/2019 Imaging    CT AP W Contrast  IMPRESSION: 1. Primary ascending colon mass may be minimally smaller. Peritoneal metastatic disease appears slightly improved as well. 2. Chronic calcific pancreatitis. 3. Tiny left renal stone. 4. Small left inguinal hernia contains a knuckle of unobstructed colon. 5. Enlarged prostate.   10/29/2019 Imaging   CT AP W Contrast IMPRESSION: No significant change in small ascending colon soft tissue mass and diffuse omental  carcinomatosis.   No new or progressive metastatic  disease identified.   Stable small left inguinal hernia containing a loop of sigmoid colon. No evidence of bowel obstruction or strangulation.   Colonic diverticulosis. No radiographic evidence of diverticulitis.   Stable enlarged prostate.      CURRENT THERAPY:  First lineFOLFOX every 2 weeks with Avastin starting with cycle 2starting 05/16/19. Oxaliplatin held C8and then reduced starting C9due toneurotoxicityand thrombocytopenia. Oxaliplatin D/c since C11 due to neuropathy and thrombocytopenia, now on maintenance therapy 5-FU/LV and Avastin.   INTERVAL HISTORY:  Nathaniel Norton is here for a follow up and treatment. He presents to the clinic alone. He notes his neuropathy has persisted recently and lasted to today. He notes his dexterity is mildly decreased. He plans to see Dr. Clovis Riley on 11/20.    REVIEW OF SYSTEMS:   Constitutional: Denies fevers, chills or abnormal weight loss Eyes: Denies blurriness of vision Ears, nose, mouth, throat, and face: Denies mucositis or sore throat Respiratory: Denies cough, dyspnea or wheezes Cardiovascular: Denies palpitation, chest discomfort or lower extremity swelling Gastrointestinal:  Denies nausea, heartburn or change in bowel habits Skin: Denies abnormal skin rashes Lymphatics: Denies new lymphadenopathy or easy bruising Neurological: (+) Neuropathy of hands, stable but persistent  Behavioral/Psych: Mood is stable, no new changes  All other systems were reviewed with the patient and are negative.  MEDICAL HISTORY:  Past Medical History:  Diagnosis Date  . colon ca dx'd 03/2019    SURGICAL HISTORY: Past Surgical History:  Procedure Laterality Date  . CATARACT EXTRACTION    . IR IMAGING GUIDED PORT INSERTION  05/10/2019  . L4 fracture  2012  . RETINAL DETACHMENT SURGERY    . Right hand surgery  1974    I have reviewed the social history and family history with the patient and they are unchanged from previous note.   ALLERGIES:  is allergic to feraheme [ferumoxytol] and codeine.  MEDICATIONS:  Current Outpatient Medications  Medication Sig Dispense Refill  . ferrous sulfate 325 (65 FE) MG tablet Take 325 mg by mouth daily with breakfast.    . lidocaine-prilocaine (EMLA) cream Apply to affected area once 30 g 3  . Multiple Vitamin (MULTIVITAMIN) tablet Take 1 tablet by mouth daily.    . ondansetron (ZOFRAN) 8 MG tablet Take 1 tablet (8 mg total) by mouth 2 (two) times daily as needed for refractory nausea / vomiting. Start on day 3 after chemotherapy. 30 tablet 1  . prochlorperazine (COMPAZINE) 10 MG tablet Take 1 tablet (10 mg total) by mouth every 6 (six) hours as needed (Nausea or vomiting). 30 tablet 1   No current facility-administered medications for this visit.    Facility-Administered Medications Ordered in Other Visits  Medication Dose Route Frequency Provider Last Rate Last Dose  . 0.9 %  sodium chloride infusion   Intravenous Continuous Truitt Merle, MD 20 mL/hr at 10/31/19 1043    . bevacizumab-bvzr (ZIRABEV) 400 mg in sodium chloride 0.9 % 100 mL chemo infusion  5 mg/kg (Treatment Plan Recorded) Intravenous Once Truitt Merle, MD      . dexamethasone (DECADRON) injection 10 mg  10 mg Intravenous Once Truitt Merle, MD      . fluorouracil (ADRUCIL) 3,800 mg in sodium chloride 0.9 % 74 mL chemo infusion  2,000 mg/m2 (Treatment Plan Recorded) Intravenous 1 day or 1 dose Truitt Merle, MD      . leucovorin 756 mg in dextrose 5 % 250 mL infusion  400 mg/m2 (Treatment Plan Recorded)  Intravenous Once Truitt Merle, MD      . palonosetron Leona Carry) injection 0.25 mg  0.25 mg Intravenous Once Truitt Merle, MD        PHYSICAL EXAMINATION: ECOG PERFORMANCE STATUS: 1 - Symptomatic but completely ambulatory  Vitals:   10/31/19 0942  BP: (!) 143/88  Pulse: (!) 55  Resp: 17  Temp: 98 F (36.7 C)  SpO2: 100%   Filed Weights   10/31/19 0942  Weight: 165 lb 4.8 oz (75 kg)    GENERAL:alert, no distress and comfortable  SKIN: skin color, texture, turgor are normal, no rashes or significant lesions EYES: normal, Conjunctiva are pink and non-injected, sclera clear  NECK: supple, thyroid normal size, non-tender, without nodularity LYMPH:  no palpable lymphadenopathy in the cervical, axillary  LUNGS: clear to auscultation and percussion with normal breathing effort HEART: regular rate & rhythm and no murmurs and no lower extremity edema ABDOMEN:abdomen soft, non-tender and normal bowel sounds Musculoskeletal:no cyanosis of digits and no clubbing  NEURO: alert & oriented x 3 with fluent speech, no focal motor/sensory deficits  LABORATORY DATA:  I have reviewed the data as listed CBC Latest Ref Rng & Units 10/31/2019 10/17/2019 10/09/2019  WBC 4.0 - 10.5 K/uL 4.4 4.3 3.1(L)  Hemoglobin 13.0 - 17.0 g/dL 14.1 14.2 14.6  Hematocrit 39.0 - 52.0 % 43.1 41.9 43.8  Platelets 150 - 400 K/uL 86(L) 85(L) 115(L)     CMP Latest Ref Rng & Units 10/31/2019 10/17/2019 10/09/2019  Glucose 70 - 99 mg/dL 83 96 87  BUN 8 - 23 mg/dL '11 10 16  ' Creatinine 0.61 - 1.24 mg/dL 0.93 0.99 0.85  Sodium 135 - 145 mmol/L 141 142 140  Potassium 3.5 - 5.1 mmol/L 4.3 4.1 4.4  Chloride 98 - 111 mmol/L 105 106 101  CO2 22 - 32 mmol/L '26 22 27  ' Calcium 8.9 - 10.3 mg/dL 9.3 9.7 9.8  Total Protein 6.5 - 8.1 g/dL 7.0 7.3 7.3  Total Bilirubin 0.3 - 1.2 mg/dL 1.5(H) 1.0 1.9(H)  Alkaline Phos 38 - 126 U/L 97 86 76  AST 15 - 41 U/L '24 24 25  ' ALT 0 - 44 U/L '25 22 27      ' RADIOGRAPHIC STUDIES: I have personally reviewed the radiological images as listed and agreed with the findings in the report. Ct Abdomen Pelvis W Contrast  Result Date: 10/29/2019 CLINICAL DATA:  Follow-up stage IV colon carcinoma. Currently undergoing chemotherapy. EXAM: CT ABDOMEN AND PELVIS WITH CONTRAST TECHNIQUE: Multidetector CT imaging of the abdomen and pelvis was performed using the standard protocol following bolus administration of intravenous contrast.  CONTRAST:  139m OMNIPAQUE IOHEXOL 300 MG/ML  SOLN COMPARISON:  07/23/2019 FINDINGS: Lower Chest: No acute findings. Hepatobiliary: No hepatic masses identified. Pancreas: No mass or acute inflammatory changes. Scattered punctate calcifications in the pancreas are suspicious for chronic pancreatitis. Spleen: Within normal limits in size and appearance. Adrenals/Urinary Tract: No masses identified. Several small renal cysts again noted. A 2 mm nonobstructing calculus again noted in lower pole of left kidney. No evidence of hydronephrosis. Unremarkable unopacified urinary bladder. Stomach/Bowel: Focal area of mild colonic wall thickening in the ascending colon shows no significant change compared to prior study. Soft tissue caking throughout the omental fat shows no significant change compared to prior study, consistent with peritoneal carcinomatosis. No evidence of ascites. Diverticulosis is seen mainly involving the sigmoid colon, however there is no evidence of diverticulitis. Vascular/Lymphatic: No pathologically enlarged lymph nodes. No abdominal aortic aneurysm. Reproductive:  Stable mildly enlarged  prostate. Other: Stable small left inguinal hernia containing a loop of sigmoid colon. No evidence of bowel obstruction or strangulation. Musculoskeletal: No suspicious bone lesions identified. Multiple old lower thoracic and lumbar vertebral body compression fracture deformities are stable. IMPRESSION: No significant change in small ascending colon soft tissue mass and diffuse omental carcinomatosis. No new or progressive metastatic disease identified. Stable small left inguinal hernia containing a loop of sigmoid colon. No evidence of bowel obstruction or strangulation. Colonic diverticulosis. No radiographic evidence of diverticulitis. Stable enlarged prostate. Electronically Signed   By: Marlaine Hind M.D.   On: 10/29/2019 15:43     ASSESSMENT & PLAN:  Ariez Neilan is a 68 y.o. Norton with   1. Cancer of  right colon,with peritoneal metastasis,stage IV,MMR normal, KRAS mutation (+), MSS -He wasrecentlydiagnosed in 03/2019. His Colonoscopy biopsy showed adenocarcinoma of right hepatic flexurecolon.His peritoneal biopsy from 04/27/19 showsadenocarcinoma,consistent withmetastasis of his colon cancer. This is now stage IV disease which is no longer curable but still treatable. -He startedfirst line FOLFOX q2weekson6/3/20, Avastin added with cycle 2.Dose moderately reduce starting with C9 due to neuropathy. Oxaliplatin was stopped from C11 due to thrombocytopenia and neuropathy  -I personally reviewed and discussed CT AP from 10/29/19 with pt which shows stable disease. Will continue current maintenance regimen for as long as this controls his disease.  -He will be seen by Dr. Clovis Riley to further discuss HIPEC surgery on 11/02/19. If further disease control is needed before HIPEC surgery, I may start him on FOLFIRI to help him became surgical eligibility. If surgery is not offered at all, will continue with systemic treatment to control his disease. He understands. I will stop chemo at least one month before his HIPEC surgery  -Labs reviewed, thrombocytopenia stable. Overall adequate to proceed with C13 maintenance 5-fu/LV and Avastin.  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 Watt Climes showed internal hemorrhoids, benign polyps, diverticulosis and colon cancer.  -Since 03/2019 he has been on OTC multivitamin and oral iron in late 03/2019.  -He has been treated with1doses of IV Feraheme on 05/03/19 and 05/16/19. He did have reaction to 2nd infusionand infusion was stopped.Will give another type of iron in the future if needed.  -Anemiarecentlyresolved.   3. Elevated PSA  -PSA was 5.7 in 11/2018 and 4.6 in 01/2019 -He plans to wait on seeing Urologist for work up given colon cancer diagnosis. Thisis  understandable.   4. Social Support -He is single with no children and no family that [place]. -He lives alone in 1 level [employer]. -He does not have Internet access at home. He does have a cell phone which he can be reached. -If he needs help with transportation for treatment, I discussed our social workers can help him.  -I encouraged him to find someone to help him as needed at home.  5.Goal of care discussion  -The patient understands the goal of care is palliative. -he is full code now  6. Taste change, lower appetite, Constipation  -Secondary to chemo -He is on Ensure. Will monitorand f/u with dietician as needed. -With smaller frequent meals his weight is able to trend up lately.  -Constipation controled on Miralax or Ducolax. Stable.   7. Mild neuropathy  -S/p C7 FOLFOX, mainly in hands.Mostly cold related neuropathy.  -Oxaliplatin dose reduced more than 50% starting with C9. D/c since C11 due to neuropathy.  -Mostly stable and mainly in hands  and lasts longer. None in toes/feet.   8. Thrombocytopenia, Hyperbilirubinemia  -S/p C10 his plt significantly decreased to 79K and tbili newly increased to 2.0 (10/03/19). Oxaliplatin D/c  since C11.  -PLT 86k stable (10/31/19. He denies any bleeding.    PLAN: -CT AP reviewed, overall stable disease. Will ask radiology to push all his scans to power share so Dr. Clovis Riley can review them  -Labs reviewed and adequate to proceed with C13 maintenance 5FU/LV and Avastin today -Lab, flush, f/u and chemo in 2 weeks  -He is scheduled to see Dr. Clovis Riley on 11/20    No problem-specific Assessment & Plan notes found for this encounter.   No orders of the defined types were placed in this encounter.  All questions were answered. The patient knows to call the clinic with any problems, questions or concerns. No barriers to learning was detected. I spent 20 minutes counseling the patient face to face. The  total time spent in the appointment was 25 minutes and more than 50% was on counseling and review of test results     Truitt Merle, MD 10/31/2019   I, Joslyn Devon, am acting as scribe for Truitt Merle, MD.   I have reviewed the above documentation for accuracy and completeness, and I agree with the above.

## 2019-10-31 NOTE — Progress Notes (Signed)
Spoke with Tedra Coupe in Imaging requested all scans be pushed to Power Share so [facility] (Dr. Clovis Riley) can see.

## 2019-10-31 NOTE — Patient Instructions (Signed)
[facility] Discharge Instructions for Patients Receiving Chemotherapy  Today you received the following chemotherapy agents Bevacizumab (ZIRABEV), Leucovorin & Flourouracil (ADRUCIL).  To help prevent nausea and vomiting after your treatment, we encourage you to take your nausea medication as prescribed.    If you develop nausea and vomiting that is not controlled by your nausea medication, call the clinic.   BELOW ARE SYMPTOMS THAT SHOULD BE REPORTED IMMEDIATELY:  *FEVER GREATER THAN 100.5 F  *CHILLS WITH OR WITHOUT FEVER  NAUSEA AND VOMITING THAT IS NOT CONTROLLED WITH YOUR NAUSEA MEDICATION  *UNUSUAL SHORTNESS OF BREATH  *UNUSUAL BRUISING OR BLEEDING  TENDERNESS IN MOUTH AND THROAT WITH OR WITHOUT PRESENCE OF ULCERS  *URINARY PROBLEMS  *BOWEL PROBLEMS  UNUSUAL RASH Items with * indicate a potential emergency and should be followed up as soon as possible.  Feel free to call the clinic should you have any questions or concerns. The clinic phone number is [phone].  Please show the [facility] at check-in to the Emergency Department and triage nurse.  Coronavirus (COVID-19) Are you at risk?  Are you at risk for the Coronavirus (COVID-19)?  To be considered HIGH RISK for Coronavirus (COVID-19), you have to meet the following criteria:  . Traveled to [place], [place], [place], [place] or [place]; or in the [place] to [place], [place], [place], or [place]; and have fever, cough, and shortness of breath within the last 2 weeks of travel OR . Been in close contact with a person diagnosed with COVID-19 within the last 2 weeks and have fever, cough, and shortness of breath . IF YOU DO NOT MEET THESE CRITERIA, YOU ARE CONSIDERED LOW RISK FOR COVID-19.  What to do if you are HIGH RISK for COVID-19?  Marland Kitchen If you are having a medical emergency, call 911. . Seek medical care right away. Before you go to a doctor's office, urgent care or emergency  department, call ahead and tell them about your recent travel, contact with someone diagnosed with COVID-19, and your symptoms. You should receive instructions from your physician's office regarding next steps of care.  . When you arrive at healthcare provider, tell the healthcare staff immediately you have returned from visiting [place], [place], [place], [place] or [place]; or traveled in the [place] to [place], [place], [place], or [place]; in the last two weeks or you have been in close contact with a person diagnosed with COVID-19 in the last 2 weeks.   . Tell the health care staff about your symptoms: fever, cough and shortness of breath. . After you have been seen by a medical provider, you will be either: o Tested for (COVID-19) and discharged home on quarantine except to seek medical care if symptoms worsen, and asked to  - Stay home and avoid contact with others until you get your results (4-5 days)  - Avoid travel on public transportation if possible (such as bus, train, or airplane) or o Sent to the Emergency Department by EMS for evaluation, COVID-19 testing, and possible admission depending on your condition and test results.  What to do if you are LOW RISK for COVID-19?  Reduce your risk of any infection by using the same precautions used for avoiding the common cold or flu:  Marland Kitchen Wash your hands often with soap and warm water for at least 20 seconds.  If soap and water are not readily available, use an alcohol-based hand sanitizer with at least 60% alcohol.  Marland Kitchen  If coughing or sneezing, cover your mouth and nose by coughing or sneezing into the elbow areas of your shirt or coat, into a tissue or into your sleeve (not your hands). . Avoid shaking hands with others and consider head nods or verbal greetings only. . Avoid touching your eyes, nose, or mouth with unwashed hands.  . Avoid close contact with people who are sick. . Avoid places or events with large numbers of people  in one location, like concerts or sporting events. . Carefully consider travel plans you have or are making. . If you are planning any travel outside or inside the [place], visit the CDC's Travelers' Health webpage for the latest health notices. . If you have some symptoms but not all symptoms, continue to monitor at home and seek medical attention if your symptoms worsen. . If you are having a medical emergency, call 911.   [place] / e-Visit: [email or web address]         [facility]: [facility]: [phone]                   [facility]: [phone]

## 2019-11-01 ENCOUNTER — Telehealth: Payer: Self-pay | Admitting: Hematology

## 2019-11-01 NOTE — Telephone Encounter (Signed)
Per 11/18 los Next appointment scheduled

## 2019-11-02 ENCOUNTER — Other Ambulatory Visit: Payer: Self-pay

## 2019-11-02 ENCOUNTER — Inpatient Hospital Stay: Payer: Medicare Other

## 2019-11-02 VITALS — BP 134/76 | HR 57 | Temp 97.8°F | Resp 18

## 2019-11-02 DIAGNOSIS — Z452 Encounter for adjustment and management of vascular access device: Secondary | ICD-10-CM | POA: Diagnosis not present

## 2019-11-02 DIAGNOSIS — C182 Malignant neoplasm of ascending colon: Secondary | ICD-10-CM

## 2019-11-02 MED ORDER — HEPARIN SOD (PORK) LOCK FLUSH 100 UNIT/ML IV SOLN
500.0000 [IU] | Freq: Once | INTRAVENOUS | Status: AC | PRN
  Administered 2019-11-02: 500 [IU]
  Filled 2019-11-02: qty 5

## 2019-11-02 MED ORDER — SODIUM CHLORIDE 0.9% FLUSH
10.0000 mL | INTRAVENOUS | Status: DC | PRN
  Administered 2019-11-02: 10 mL
  Filled 2019-11-02: qty 10

## 2019-11-05 ENCOUNTER — Ambulatory Visit (HOSPITAL_COMMUNITY): Payer: Medicare Other

## 2019-11-12 ENCOUNTER — Telehealth: Payer: Self-pay

## 2019-11-12 NOTE — Telephone Encounter (Signed)
Spoke with patient per Dr. Burr Medico to let him know that because he is having surgery on 12/17/2019 we will not do treatment on 12/2.  He verbalized an understanding.

## 2019-11-13 NOTE — Progress Notes (Signed)
[place]   [phone] [phone]   Clinic Follow up Note   Patient Care Team: Nathaniel Manes, MD as PCP - General (Internal Medicine) Nathaniel Mull, MD as Consulting Physician (Family Medicine) Nathaniel Essex, MD as Consulting Physician (Gastroenterology) 11/14/2019  CHIEF COMPLAINT: F/u metastatic colon cancer   SUMMARY OF ONCOLOGIC HISTORY: Oncology History Overview Note  Cancer Staging Cancer of right colon [facility]) Staging form: Colon and Rectum, AJCC 8th Edition - Clinical stage from 04/09/2019: Stage IVC (cTX, cNX, pM1c) - Signed by Nathaniel Merle, MD on 05/03/2019     Cancer of right colon [facility])  04/09/2019 Procedure   Colonoscopy 04/09/19 by Dr Nathaniel Norton IMPRESSION -internal hemorrhoids -Diverticulosis in the sigmoid colon  -2 small polyps in the rectum and in the proximal transverse colon, removed with a hot snare. Resected and retrieved.  -3 medium polyps in the proximal transverse colon, in the mid transverse colon and in the distal transverse colon, removed and resected and retrieved.  -likely malignant partially obstructing tumor at the hepatic flexure. biopsied, tattooed.  -1 large polyp in the mid ascending colon  -the examination was otherwise normal     04/09/2019 Initial Biopsy   FINAL MICROSCOPIC DIAGNOSIS: 04/09/19 1. LG intestine-hepatic flexure, Biopsy:   INVASIVE WELL DIFFERENTIATED ADENOCARCINOMA   04/09/2019 Cancer Staging   Staging form: Colon and Rectum, AJCC 8th Edition - Clinical stage from 04/09/2019: Stage IVC (cTX, cNX, pM1c) - Signed by Nathaniel Merle, MD on 05/03/2019   04/10/2019 Imaging   CT AP 04/10/19  IMPRESSION: 1. There is an eccentric mass of the colon involving the ascending colon near the hepatic flexure measuring approximately 3.4 x 3.4 by 2.0 cm (series 2, image 37, series 3, image 37). There is extensive soft tissue nodularity of the mesocolon and omentum, and likely areas of the peritoneum, for example  bilateral upper quadrants (series 2, image 25). Findings are consistent with primary colon malignancy, probable omental and peritoneal involvement, and small volume malignant ascites. 2.  Other chronic and incidental findings as detailed above.   04/20/2019 Initial Diagnosis   Cancer of right colon ([place])   04/26/2019 Imaging   CT Chest 04/26/19 IMPRESSION: 1. Borderline to mild lower thoracic adenopathy, including within the right internal mammary and juxta cardiophrenic stations. Given the appearance of the upper abdomen, suspicious for nodal metastasis. 2. A low right paratracheal node is borderline sized, but favored to be reactive. 3. No evidence of pulmonary metastasis. 4. Peritoneal metastasis and abdominal ascites, as before. 5. Pancreatic parenchymal calcifications indicative of chronic calcific pancreatitis. 6. Coronary artery atherosclerosis.   04/27/2019 Pathology Results   Diagnosis 04/27/19 Peritoneum, biopsy, right upper quadrant, perihepatic - ADENOCARCINOMA, CONSISTENT WITH COLONIC PRIMARY. - SEE COMMENT.   05/16/2019 -  Chemotherapy   First line FOLFOX every 2 weeks with Avastin starting with cycle 2 starting 05/16/19. 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    07/23/2019 Imaging    CT AP W Contrast  IMPRESSION: 1. Primary ascending colon mass may be minimally smaller. Peritoneal metastatic disease appears slightly improved as well. 2. Chronic calcific pancreatitis. 3. Tiny left renal stone. 4. Small left inguinal hernia contains a knuckle of unobstructed colon. 5. Enlarged prostate.   10/29/2019 Imaging   CT AP W Contrast IMPRESSION: No significant change in small ascending colon soft tissue mass and diffuse omental carcinomatosis.   No  new or progressive metastatic disease identified.   Stable  small left inguinal hernia containing a loop of sigmoid colon. No evidence of bowel obstruction or strangulation.   Colonic diverticulosis. No radiographic evidence of diverticulitis.   Stable enlarged prostate.     CURRENT THERAPY: First lineFOLFOX every 2 weeks with Avastin starting with cycle 2starting 05/16/19. Oxaliplatin held C8and then reduced starting C9due toneurotoxicityand thrombocytopenia. Oxaliplatin D/c since C11 due to neuropathy and thrombocytopenia, now on maintenance therapy 5-FU/LV and Avastin.   INTERVAL HISTORY: Nathaniel Norton returns for f/u as scheduled. He completed last cycle FOLFOX and avastin on 10/31/19. He was seen by Dr. Clovis Norton on 11/20 who plans to do HIPEC surgery on 12/17/19. Days 4 and 5 after chemo are the worst, then he recovers. He feels pretty well today. Appetite is good. Fatigue fluctuates but reasonably good lately. Has BM once daily which is not his normal but stable. Denies n/v or abdominal pain. Has arthritis pain in both thumbs. He has neuropathy more in left hand than right, small buttons are difficult but otherwise functions well. Has clear rhinorrhea and occasional blood streaks and occasional cough. Denies congestion, sore throat, fever, chills, chest pain, dyspnea, or swelling.    MEDICAL HISTORY:  Past Medical History:  Diagnosis Date  . colon ca dx'd 03/2019    SURGICAL HISTORY: Past Surgical History:  Procedure Laterality Date  . CATARACT EXTRACTION    . IR IMAGING GUIDED PORT INSERTION  05/10/2019  . L4 fracture  2012  . RETINAL DETACHMENT SURGERY    . Right hand surgery  1974    I have reviewed the social history and family history with the patient and they are unchanged from previous note.  ALLERGIES:  is allergic to feraheme [ferumoxytol] and codeine.  MEDICATIONS:  Current Outpatient Medications  Medication Sig Dispense Refill  . ferrous sulfate 325 (65 FE) MG tablet Take 325 mg by mouth daily with breakfast.    .  lidocaine-prilocaine (EMLA) cream Apply to affected area once 30 g 3  . metroNIDAZOLE (FLAGYL) 500 MG tablet Take 500 mg by mouth once.    . Multiple Vitamin (MULTIVITAMIN) tablet Take 1 tablet by mouth daily.    Marland Kitchen neomycin (MYCIFRADIN) 500 MG tablet Take 500 mg by mouth 3 (three) times daily. 3x daily for 1 day    . ondansetron (ZOFRAN) 8 MG tablet Take 1 tablet (8 mg total) by mouth 2 (two) times daily as needed for refractory nausea / vomiting. Start on day 3 after chemotherapy. 30 tablet 1  . prochlorperazine (COMPAZINE) 10 MG tablet Take 1 tablet (10 mg total) by mouth every 6 (six) hours as needed (Nausea or vomiting). 30 tablet 1   No current facility-administered medications for this visit.     PHYSICAL EXAMINATION: ECOG PERFORMANCE STATUS: 1 - Symptomatic but completely ambulatory  Vitals:   11/14/19 0931  BP: (!) 144/85  Pulse: 62  Resp: 17  Temp: 98 F (36.7 C)  SpO2: 100%   Filed Weights   11/14/19 0931  Weight: 167 lb 4.8 oz (75.9 kg)    GENERAL:alert, no distress and comfortable SKIN: facial erythema and dry skin EYES:  sclera clear LUNGS: clear with normal breathing effort HEART: regular rate & rhythm, no lower extremity edema ABDOMEN: abdomen soft, non-tender and normal bowel sounds NEURO: alert & oriented x 3 with fluent speech PAC without erythema   LABORATORY DATA:  I have reviewed the data as listed CBC Latest Ref Rng & Units 11/14/2019 10/31/2019 10/17/2019  WBC 4.0 - 10.5 K/uL 6.1 4.4 4.3  Hemoglobin 13.0 - 17.0 g/dL 14.2 14.1 14.2  Hematocrit 39.0 - 52.0 % 42.9 43.1 41.9  Platelets 150 - 400 K/uL 106(L) 86(L) 85(L)     CMP Latest Ref Rng & Units 11/14/2019 10/31/2019 10/17/2019  Glucose 70 - 99 mg/dL 98 83 96  BUN 8 - 23 mg/dL _0 Creatinine 0.61 - 1.24 mg/dL 0.94 0.93 0.99  Sodium 135 - 145 mmol/L 141 141 142  Potassium 3.5 - 5.1 mmol/L 4.6 4.3 4.1  Chloride 98 - 111 mmol/L 105 105 106  CO2 22 - 32 mmol/L _1 Calcium 8.9 - 10.3  mg/dL 9.9 9.3 9.7  Total Protein 6.5 - 8.1 g/dL 7.2 7.0 7.3  Total Bilirubin 0.3 - 1.2 mg/dL 1.2 1.5(H) 1.0  Alkaline Phos 38 - 126 U/L 103 97 86  AST 15 - 41 U/L _2 ALT 0 - 44 U/L _3 RADIOGRAPHIC STUDIES: I have personally reviewed the radiological images as listed and agreed with the findings in the report. No results found.   ASSESSMENT & PLAN: Nathaniel Norton a 68 y.o.malewith   1. Cancer of right colon,with peritoneal metastasis,stage IV,MMR normal -Diagnosed in 03/2019. His Colonoscopy biopsy showed adenocarcinoma of right hepatic flexure.Peritoneal biopsy from 04/27/19 showsadenocarcinoma,consistent withmetastasis of his colon cancer, stage IV disease -tumor isMMRnormal, he is not a candidate forimmunotherapy.FO resultshows MSI-Stable, KRASmutation (+). He is not eligible for immunotherapy at this time nor EGFR inhibitor. He will continue with biological agent Avastin. -S/p 10 cycles of FOLFOX and Avastin, oxaliplatin and 5FU have been dose reduced for neuropathy and thrombocytopenia.  -Oxaliplatin was eventually d/c'd with cycle 11 due to progressive neuropathy and thrombocytopenia,  -s/p 13 treatment cycles, he tolerated maintenance 5FU/leuc and avastin well overall with fatigue, taste change, and constipation on days 4 and 5. He recovers well -CT on 10/29/19 showed stable disease. His last treatment was on 11/18. He was seen by Dr. Clovis Norton who felt him to be a surgical candidate, he is scheduled for HIPEC on 1/4. I reviewed his plan with Dr. Burr Medico -Labs are stable, PLT 106K, no significant bleeding  -We will see him back after surgery, to review final path and discuss the plan going forward.  -lab, f/u in 8 weeks   2. Peripheral neuropathy, secondary to Oxaliplatin -he developed mild intermittent tingling in fingertips after cycle 6 FOLFOXand progressed to constant tingling after cycle 7 -oxaliplatin was previously reduced for  constipation -no functional difficulties or neuro deficits on exam -he has progressive cold sensitivity after cycle 7. 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  -GI workup with coloscopy by Dr. Watt Norton showed internal hemorrhoids, benign polyps, diverticulosis and colon cancer.  -He was started on OTC multivitamin and oral iron in late 03/2019 and has been havingintermittentblack stoollikely related to iron supplement -s/p1doses of IV Feraheme on 05/03/19 and 05/16/19. He did have reaction to 2nd infusionanditwas stopped. -ferritin 140 today, adequate on oral iron once daily. No need for additional Feraheme at this time -will monitor after surgery   4. Elevated PSA  -PSA was 5.7 in 11/2018 and 4.6 in  01/2019 -urology f/u deferred due to metastatic colon cancer  5. Social Support -limited social support, has a neighbor who is close friend checks on him and can help with meals etc after surgery. He does not like to impose on others   6.Goal of care discussion-full code -he was on palliative chemo, to control disease and prolong his life -He is scheduled to undergo HIPEC on 12/16/18   7. Taste change, lower appetite  -Secondary to chemo -followed by dietician -worse on days 4-5 after chemo -recovers well, weight improved   PLAN: -Labs reviewed -Hold treatment -HIPEC per Dr. Clovis Norton on 12/16/18 -B complex for CIPN -F/u in 8 weeks   All questions were answered. The patient knows to call the clinic with any problems, questions or concerns. No barriers to learning was detected. I spent 20 minutes counseling the patient face to face. The total time spent in the  appointment was 25 minutes and more than 50% was on counseling and review of test results     Alla Feeling, NP 11/14/19

## 2019-11-14 ENCOUNTER — Other Ambulatory Visit: Payer: Self-pay

## 2019-11-14 ENCOUNTER — Ambulatory Visit: Payer: Medicare Other

## 2019-11-14 ENCOUNTER — Inpatient Hospital Stay (HOSPITAL_BASED_OUTPATIENT_CLINIC_OR_DEPARTMENT_OTHER): Payer: Medicare Other | Admitting: Nurse Practitioner

## 2019-11-14 ENCOUNTER — Inpatient Hospital Stay: Payer: Medicare Other | Attending: Hematology

## 2019-11-14 ENCOUNTER — Inpatient Hospital Stay: Payer: Medicare Other

## 2019-11-14 ENCOUNTER — Encounter: Payer: Self-pay | Admitting: Nurse Practitioner

## 2019-11-14 VITALS — BP 144/85 | HR 62 | Temp 98.0°F | Resp 17 | Ht 66.0 in | Wt 167.3 lb

## 2019-11-14 DIAGNOSIS — C786 Secondary malignant neoplasm of retroperitoneum and peritoneum: Secondary | ICD-10-CM | POA: Insufficient documentation

## 2019-11-14 DIAGNOSIS — R0602 Shortness of breath: Secondary | ICD-10-CM | POA: Insufficient documentation

## 2019-11-14 DIAGNOSIS — R5383 Other fatigue: Secondary | ICD-10-CM | POA: Insufficient documentation

## 2019-11-14 DIAGNOSIS — D500 Iron deficiency anemia secondary to blood loss (chronic): Secondary | ICD-10-CM

## 2019-11-14 DIAGNOSIS — C182 Malignant neoplasm of ascending colon: Secondary | ICD-10-CM

## 2019-11-14 DIAGNOSIS — J3489 Other specified disorders of nose and nasal sinuses: Secondary | ICD-10-CM | POA: Insufficient documentation

## 2019-11-14 DIAGNOSIS — D509 Iron deficiency anemia, unspecified: Secondary | ICD-10-CM | POA: Insufficient documentation

## 2019-11-14 DIAGNOSIS — R05 Cough: Secondary | ICD-10-CM | POA: Insufficient documentation

## 2019-11-14 DIAGNOSIS — Z79899 Other long term (current) drug therapy: Secondary | ICD-10-CM | POA: Insufficient documentation

## 2019-11-14 DIAGNOSIS — G629 Polyneuropathy, unspecified: Secondary | ICD-10-CM | POA: Diagnosis not present

## 2019-11-14 DIAGNOSIS — Z95828 Presence of other vascular implants and grafts: Secondary | ICD-10-CM

## 2019-11-14 LAB — CBC WITH DIFFERENTIAL (CANCER CENTER ONLY)
Abs Immature Granulocytes: 0.02 10*3/uL (ref 0.00–0.07)
Basophils Absolute: 0 10*3/uL (ref 0.0–0.1)
Basophils Relative: 0 %
Eosinophils Absolute: 0.2 10*3/uL (ref 0.0–0.5)
Eosinophils Relative: 3 %
HCT: 42.9 % (ref 39.0–52.0)
Hemoglobin: 14.2 g/dL (ref 13.0–17.0)
Immature Granulocytes: 0 %
Lymphocytes Relative: 19 %
Lymphs Abs: 1.1 10*3/uL (ref 0.7–4.0)
MCH: 29.9 pg (ref 26.0–34.0)
MCHC: 33.1 g/dL (ref 30.0–36.0)
MCV: 90.3 fL (ref 80.0–100.0)
Monocytes Absolute: 0.6 10*3/uL (ref 0.1–1.0)
Monocytes Relative: 10 %
Neutro Abs: 4.2 10*3/uL (ref 1.7–7.7)
Neutrophils Relative %: 68 %
Platelet Count: 106 10*3/uL — ABNORMAL LOW (ref 150–400)
RBC: 4.75 MIL/uL (ref 4.22–5.81)
RDW: 14.5 % (ref 11.5–15.5)
WBC Count: 6.1 10*3/uL (ref 4.0–10.5)
nRBC: 0 % (ref 0.0–0.2)

## 2019-11-14 LAB — FERRITIN: Ferritin: 140 ng/mL (ref 24–336)

## 2019-11-14 LAB — CMP (CANCER CENTER ONLY)
ALT: 21 U/L (ref 0–44)
AST: 20 U/L (ref 15–41)
Albumin: 4 g/dL (ref 3.5–5.0)
Alkaline Phosphatase: 103 U/L (ref 38–126)
Anion gap: 9 (ref 5–15)
BUN: 16 mg/dL (ref 8–23)
CO2: 27 mmol/L (ref 22–32)
Calcium: 9.9 mg/dL (ref 8.9–10.3)
Chloride: 105 mmol/L (ref 98–111)
Creatinine: 0.94 mg/dL (ref 0.61–1.24)
GFR, Est AFR Am: >60 mL/min (ref >60)
GFR, Estimated: >60 mL/min (ref >60)
Glucose, Bld: 98 mg/dL (ref 70–99)
Potassium: 4.6 mmol/L (ref 3.5–5.1)
Sodium: 141 mmol/L (ref 135–145)
Total Bilirubin: 1.2 mg/dL (ref 0.3–1.2)
Total Protein: 7.2 g/dL (ref 6.5–8.1)

## 2019-11-14 LAB — CEA (IN HOUSE-CHCC): CEA (CHCC-In House): 1.71 ng/mL (ref 0.00–5.00)

## 2019-11-14 MED ORDER — SODIUM CHLORIDE 0.9% FLUSH
10.0000 mL | INTRAVENOUS | Status: DC | PRN
  Administered 2019-11-14: 09:00:00 10 mL
  Filled 2019-11-14: qty 10

## 2019-11-14 MED ORDER — HEPARIN SOD (PORK) LOCK FLUSH 100 UNIT/ML IV SOLN
500.0000 [IU] | Freq: Once | INTRAVENOUS | Status: AC | PRN
  Administered 2019-11-14: 09:00:00 500 [IU]
  Filled 2019-11-14: qty 5

## 2019-11-15 ENCOUNTER — Telehealth: Payer: Self-pay | Admitting: Hematology

## 2019-11-15 NOTE — Telephone Encounter (Signed)
Scheduled appt per 12/2 los.  Spoke with pt and they are aware of the appt date and time,

## 2020-01-02 NOTE — Progress Notes (Signed)
[place]   [phone] [phone]   Clinic Follow up Note   Patient Care Team: Lajean Manes, MD as PCP - General (Internal Medicine) Berle Mull, MD as Consulting Physician (Family Medicine) Clarene Essex, MD as Consulting Physician (Gastroenterology)  Date of Service:  01/09/2020  CHIEF COMPLAINT: F/u ofmetastaticcolon cancer  SUMMARY OF ONCOLOGIC HISTORY: Oncology History Overview Note  Cancer Staging Cancer of right colon [facility]) Staging form: Colon and Rectum, AJCC 8th Edition - Clinical stage from 04/09/2019: Stage IVC (cTX, cNX, pM1c) - Signed by Truitt Merle, MD on 05/03/2019     Cancer of right colon [facility])  04/09/2019 Procedure   Colonoscopy 04/09/19 by Dr Watt Climes IMPRESSION -internal hemorrhoids -Diverticulosis in the sigmoid colon  -2 small polyps in the rectum and in the proximal transverse colon, removed with a hot snare. Resected and retrieved.  -3 medium polyps in the proximal transverse colon, in the mid transverse colon and in the distal transverse colon, removed and resected and retrieved.  -likely malignant partially obstructing tumor at the hepatic flexure. biopsied, tattooed.  -1 large polyp in the mid ascending colon  -the examination was otherwise normal     04/09/2019 Initial Biopsy   FINAL MICROSCOPIC DIAGNOSIS: 04/09/19 1. LG intestine-hepatic flexure, Biopsy:   INVASIVE WELL DIFFERENTIATED ADENOCARCINOMA   04/09/2019 Cancer Staging   Staging form: Colon and Rectum, AJCC 8th Edition - Clinical stage from 04/09/2019: Stage IVC (cTX, cNX, pM1c) - Signed by Truitt Merle, MD on 05/03/2019   04/10/2019 Imaging   CT AP 04/10/19  IMPRESSION: 1. There is an eccentric mass of the colon involving the ascending colon near the hepatic flexure measuring approximately 3.4 x 3.4 by 2.0 cm (series 2, image 37, series 3, image 37). There is extensive soft tissue nodularity of the mesocolon and omentum, and likely areas of the  peritoneum, for example bilateral upper quadrants (series 2, image 25). Findings are consistent with primary colon malignancy, probable omental and peritoneal involvement, and small volume malignant ascites. 2.  Other chronic and incidental findings as detailed above.   04/20/2019 Initial Diagnosis   Cancer of right colon ([place])   04/26/2019 Imaging   CT Chest 04/26/19 IMPRESSION: 1. Borderline to mild lower thoracic adenopathy, including within the right internal mammary and juxta cardiophrenic stations. Given the appearance of the upper abdomen, suspicious for nodal metastasis. 2. A low right paratracheal node is borderline sized, but favored to be reactive. 3. No evidence of pulmonary metastasis. 4. Peritoneal metastasis and abdominal ascites, as before. 5. Pancreatic parenchymal calcifications indicative of chronic calcific pancreatitis. 6. Coronary artery atherosclerosis.   04/27/2019 Pathology Results   Diagnosis 04/27/19 Peritoneum, biopsy, right upper quadrant, perihepatic - ADENOCARCINOMA, CONSISTENT WITH COLONIC PRIMARY. - SEE COMMENT.   05/16/2019 - 10/31/2019 Chemotherapy   First line FOLFOX every 2 weeks with Avastin starting with cycle 2 starting 05/16/19. 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 Stopped after 10/31/19 for surgery.    07/23/2019 Imaging    CT AP W Contrast  IMPRESSION: 1. Primary ascending colon mass may be minimally smaller. Peritoneal metastatic disease appears slightly improved as well. 2. Chronic calcific pancreatitis. 3. Tiny left renal stone. 4. Small left inguinal hernia contains a knuckle of unobstructed colon. 5. Enlarged prostate.   10/29/2019 Imaging   CT AP W Contrast IMPRESSION: No significant change in small ascending colon soft tissue  mass and diffuse omental  carcinomatosis.   No new or progressive metastatic disease identified.   Stable small left inguinal hernia containing a loop of sigmoid colon. No evidence of bowel obstruction or strangulation.   Colonic diverticulosis. No radiographic evidence of diverticulitis.   Stable enlarged prostate.   12/17/2019 Pathology Results   HIPEC Surgery by Dr Clovis Riley 12/17/19 FINAL PATHOLOGIC DIAGNOSIS  MICROSCOPIC EXAMINATION AND DIAGNOSIS  A.          "RIGHT COLON, OMENTUM, SPLEEN":       Invasive adenocarcinoma with mucinous features, moderately differentiated.  Tumor involves mesentery and spleen.  Five out of ten lymph nodes involved by adenocarcinoma with perinodal soft tissue involvement (5/10), see comment.  Margins are uninvolved by invasive carcinoma.  Ileum and appendix, uninvolved.  See comment and cancer case summary.  B.          "ROUND LIGAMENT OF LIVER":       Involved by adenocarcinoma with mucinous features, moderately differentiated.  C.          "RIGHT DIAPHRAM STRIPPING":       Negative for carcinoma.  D.          "RIGHT HEPATIC CAPSULECTOMY":       Chronic inflammation and fibrosis.  Negative for carcinoma.  E.          "DEBRIDED TUMOR":       Involved by adenocarcinoma with mucinous features, moderately differentiated.   COMMENT: Sections disclose five out of ten lymph nodes involved by adenocarcinoma with perinodal soft tissue involvement. However, it is not possible to be certain if this represents lymph nodes replaced by tumor and/or soft tissue involvement. In addition, focal perineural invasion is seen.      CURRENT THERAPY:  Supportive Care   INTERVAL HISTORY:  Nathaniel Norton is here for a follow up and treatment. He presents to the clinic alone. He notes he will see Dr. Clovis Riley on 1/29. He has been having abdominal issues. He barley has any pain, not on medication. He notes his biggest issue is no appetite and does not want to eat or drink anything.  He has lost weight. He denies much nausea. He gets full feeling easily. He has tried ensure. He notes he has issues sleeping, he can only sleep flat on his back. He notes diarrhea since discharge and decreased intake due to this.     REVIEW OF SYSTEMS:   Constitutional: Denies fevers, chills (+) Low food intake, low appetite, weight loss (+) Trouble sleeping  Eyes: Denies blurriness of vision Ears, nose, mouth, throat, and face: Denies mucositis or sore throat Respiratory: Denies cough, dyspnea or wheezes Cardiovascular: Denies palpitation, chest discomfort or lower extremity swelling Gastrointestinal:  Denies heartburn (+) Diarrhea has improved.  UA: (+) Urinary urgency and frequency with low output.  Skin: Denies abnormal skin rashes Lymphatics: Denies new lymphadenopathy or easy bruising Neurological:Denies numbness, tingling or new weaknesses Behavioral/Psych: Mood is stable, no new changes  All other systems were reviewed with the patient and are negative.  MEDICAL HISTORY:  Past Medical History:  Diagnosis Date  . colon ca dx'd 03/2019    SURGICAL HISTORY: Past Surgical History:  Procedure Laterality Date  . CATARACT EXTRACTION    . IR IMAGING GUIDED PORT INSERTION  05/10/2019  . L4 fracture  2012  . RETINAL DETACHMENT SURGERY    . Right hand surgery  1974    I have reviewed the social history and family history with the patient and they  are unchanged from previous note.  ALLERGIES:  is allergic to feraheme [ferumoxytol] and codeine.  MEDICATIONS:  Current Outpatient Medications  Medication Sig Dispense Refill  . ferrous sulfate 325 (65 FE) MG tablet Take 325 mg by mouth daily with breakfast.    . lidocaine-prilocaine (EMLA) cream Apply to affected area once 30 g 3  . Multiple Vitamin (MULTIVITAMIN) tablet Take 1 tablet by mouth daily.    . ondansetron (ZOFRAN) 8 MG tablet Take 1 tablet (8 mg total) by mouth 2 (two) times daily as needed for refractory nausea /  vomiting. Start on day 3 after chemotherapy. 30 tablet 1  . prochlorperazine (COMPAZINE) 10 MG tablet Take 1 tablet (10 mg total) by mouth every 6 (six) hours as needed (Nausea or vomiting). 30 tablet 1  . mirtazapine (REMERON) 7.5 MG tablet Take 1 tablet (7.5 mg total) by mouth at bedtime. 30 tablet 1   No current facility-administered medications for this visit.    PHYSICAL EXAMINATION: ECOG PERFORMANCE STATUS: 2 - Symptomatic, <50% confined to bed  Vitals:   01/09/20 1028  BP: 122/78  Pulse: 86  Resp: 17  Temp: 98 F (36.7 C)  SpO2: 100%   Filed Weights   01/09/20 1028  Weight: 148 lb 3.2 oz (67.2 kg)    GENERAL:alert, no distress and comfortable SKIN: skin color, texture, turgor are normal, no rashes or significant lesions (+) Right buttock small shallow ulcer  EYES: normal, Conjunctiva are pink and non-injected, sclera clear  NECK: supple, thyroid normal size, non-tender, without nodularity LYMPH:  no palpable lymphadenopathy in the cervical, axillary  LUNGS: clear to auscultation and percussion with normal breathing effort HEART: regular rate & rhythm and no murmurs and no lower extremity edema ABDOMEN:abdomen soft, non-tender and normal bowel sounds (+) Surgical incision healing well with mild opening at distal end, clean and dry. (+) Mild scar tissue at incision site  Musculoskeletal:no cyanosis of digits and no clubbing  NEURO: alert & oriented x 3 with fluent speech, no focal motor/sensory deficits  LABORATORY DATA:  I have reviewed the data as listed CBC Latest Ref Rng & Units 01/09/2020 11/14/2019 10/31/2019  WBC 4.0 - 10.5 K/uL 9.1 6.1 4.4  Hemoglobin 13.0 - 17.0 g/dL 11.1(L) 14.2 14.1  Hematocrit 39.0 - 52.0 % 34.4(L) 42.9 43.1  Platelets 150 - 400 K/uL 808(H) 106(L) 86(L)     CMP Latest Ref Rng & Units 01/09/2020 11/14/2019 10/31/2019  Glucose 70 - 99 mg/dL 116(H) 98 83  BUN 8 - 23 mg/dL _0 Creatinine 0.61 - 1.24 mg/dL 0.81 0.94 0.93  Sodium 135 -  145 mmol/L 137 141 141  Potassium 3.5 - 5.1 mmol/L 4.0 4.6 4.3  Chloride 98 - 111 mmol/L 101 105 105  CO2 22 - 32 mmol/L _1 Calcium 8.9 - 10.3 mg/dL 9.6 9.9 9.3  Total Protein 6.5 - 8.1 g/dL 7.2 7.2 7.0  Total Bilirubin 0.3 - 1.2 mg/dL 0.6 1.2 1.5(H)  Alkaline Phos 38 - 126 U/L 144(H) 103 97  AST 15 - 41 U/L _2 ALT 0 - 44 U/L _3 RADIOGRAPHIC STUDIES: I have personally reviewed the radiological images as listed and agreed with the findings in the report. No results found.   ASSESSMENT & PLAN:  Siraj Dermody is a 69 y.o. male with    1. Cancer of right colon,with peritoneal metastasis,stage IV,MMR normal, KRAS mutation (+), MSS -He wasrecentlydiagnosed in 03/2019. His Colonoscopy  biopsy showed adenocarcinoma of right hepatic flexurecolon.His peritoneal biopsy from 04/27/19 showsadenocarcinoma,consistent withmetastasis of his colon cancer. This is now stage IV disease which is no longer curable but still treatable. -He startedfirst line FOLFOX q2weekson6/3/20, Avastin added with cycle 2.Dose moderately reduce starting with C9 due to neuropathy.Oxaliplatin was stopped from C11 due to thrombocytopenia and neuropathy. Last dose chemo given 10/31/19 to proceed with surgery.  -He was previously seen by Dr.Levine and he underwent HIPEC surgery on 12/17/19.  -I discussed I do not plan to give more chemotherapy at this time. He will continue with cancer surveillance post surgery. -After his surgery he had 11 day stay in hospital with AKI and developed bed sore. Since discharge he has been having low by mouth intake, weight loss, trouble sleeping, diarrhea and urinary change. We reviewed supportive care for his symptoms.  -Labs reviewed, Hg 11.1, plt 808K, BG 116, albumin 3.3, alk phos 144. CEA and ferritin still pending. He is not dehydrated so far. -Physical exam shows abdominal incision is healing well with small opening at distal end. I encouraged him to  keep it clean and dry as it continues to heal. His right buttock bed sore is small and healing. I recommend he not put much pressure on it.  -F/u in 1 month. He can call with any concerns.    2. Supportive Care: No appetite/Low intake, weight loss, trouble sleeping, diarrhea, urine urgency  -After his surgery he had 11 day stay in hospital. He had AKI and developed bed sore. Since discharge he has been having no appetite, barely eating and has lost 11 pounds, trouble sleeping, diarrhea, Urinary urgency. -He had diarrhea with daily frequency since discharge and this has improved with eating less. I recommend he use imodium OTC as needed.   -He will have urinary urgency and frequency with low output. I discussed this is related to surgery. If he has incontinence he should f/u with Dr. Clovis Riley  -He has tried Ensure but cannot tolerate much, same with other liquids and food. He can barely keep down 2 meals a day.  -He can only sleep on his back otherwise he is in pain. I will call in Mirtazapine to help his sleep and his appetite. I offered to refer him to dietician, he declined. I encouraged him to increase eating and drinking.  -He was given Lovenox injection 14 days which he completed, I recommend he be more active at home to reduce risk of blood clot. I offered PT referral, he declined.  -He was prescribed cream for his right buttock bed sore but not cleared by insurance. I recommend he f/u with Dr Felipa Eth for another medication. Small and healing well on exam today (01/09/20). -His surgical incision is still open at distal end on exam today, healing well (01/09/20). Will monitor.  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 Watt Climes showed internal hemorrhoids, benign polyps, diverticulosis and colon cancer.  -Since 03/2019 he has been onOTC multivitamin and oral iron in late 03/2019. -He has been  treated withIV Feraheme on 05/03/19 and 05/16/19. He did have reaction to 2nd infusionand infusion was stopped.Will give another type of iron in the future if needed. -Mild currently at 11.1 today (01/09/20)  4. Elevated PSA  -PSA was 5.7 in 11/2018 and 4.6 in 01/2019 -He plans to wait on seeing Urologist for work up given colon cancer diagnosis. Thisis understandable.  5. Social Support -He is single with no children and no family that [place]. -He lives alone in 1 [address] [employer]. -He does not have Internet access at home.He does have a cell phone which he can be reached. -If he needs help with transportation for treatment, I discussed our social workers can help him.  -I encouraged him to find someone to help him as needed at home. -He has been having a lot of symptoms since HIPEC surgery. He is able to manage at home alone and has help from neighbors.   6.Goal of care discussion  -The patient understands the goal of care is palliative. -he is full code now  7. Mild neuropathy  -S/p C7 FOLFOX, mainly in hands.Mostly cold related neuropathy. -Oxaliplatin dose reduced more than 50% starting with C9. D/c since C11 due to neuropathy.  -Mostly stable and mainly in hands and lasts longer.     PLAN: -I called in Mirtazapine today  -he will f/u with Dr. Clovis Riley later this week -Lab and f/u in 1 month   No problem-specific Assessment & Plan notes found for this encounter.   No orders of the defined types were placed in this encounter.  All questions were answered. The patient knows to call the clinic with any problems, questions or concerns. No barriers to learning was detected. The total time spent in the appointment was 30 minutes.     Truitt Merle, MD 01/09/2020   I, Joslyn Devon, am acting as scribe for Truitt Merle, MD.   I have reviewed the above documentation for accuracy and completeness, and I agree with the above.

## 2020-01-09 ENCOUNTER — Inpatient Hospital Stay: Payer: Medicare Other | Attending: Hematology

## 2020-01-09 ENCOUNTER — Other Ambulatory Visit: Payer: Self-pay

## 2020-01-09 ENCOUNTER — Inpatient Hospital Stay: Payer: Medicare Other

## 2020-01-09 ENCOUNTER — Encounter: Payer: Self-pay | Admitting: Hematology

## 2020-01-09 ENCOUNTER — Inpatient Hospital Stay (HOSPITAL_BASED_OUTPATIENT_CLINIC_OR_DEPARTMENT_OTHER): Payer: Medicare Other | Admitting: Hematology

## 2020-01-09 VITALS — BP 122/78 | HR 86 | Temp 98.0°F | Resp 17 | Ht 66.0 in | Wt 148.2 lb

## 2020-01-09 DIAGNOSIS — D500 Iron deficiency anemia secondary to blood loss (chronic): Secondary | ICD-10-CM

## 2020-01-09 DIAGNOSIS — C182 Malignant neoplasm of ascending colon: Secondary | ICD-10-CM

## 2020-01-09 DIAGNOSIS — Z452 Encounter for adjustment and management of vascular access device: Secondary | ICD-10-CM | POA: Insufficient documentation

## 2020-01-09 DIAGNOSIS — Z79899 Other long term (current) drug therapy: Secondary | ICD-10-CM | POA: Insufficient documentation

## 2020-01-09 DIAGNOSIS — G629 Polyneuropathy, unspecified: Secondary | ICD-10-CM | POA: Insufficient documentation

## 2020-01-09 DIAGNOSIS — C786 Secondary malignant neoplasm of retroperitoneum and peritoneum: Secondary | ICD-10-CM | POA: Insufficient documentation

## 2020-01-09 DIAGNOSIS — Z95828 Presence of other vascular implants and grafts: Secondary | ICD-10-CM

## 2020-01-09 LAB — CBC WITH DIFFERENTIAL (CANCER CENTER ONLY)
Abs Immature Granulocytes: 0.04 10*3/uL (ref 0.00–0.07)
Basophils Absolute: 0.1 10*3/uL (ref 0.0–0.1)
Basophils Relative: 1 %
Eosinophils Absolute: 0 10*3/uL (ref 0.0–0.5)
Eosinophils Relative: 0 %
HCT: 34.4 % — ABNORMAL LOW (ref 39.0–52.0)
Hemoglobin: 11.1 g/dL — ABNORMAL LOW (ref 13.0–17.0)
Immature Granulocytes: 0 %
Lymphocytes Relative: 14 %
Lymphs Abs: 1.2 10*3/uL (ref 0.7–4.0)
MCH: 28.8 pg (ref 26.0–34.0)
MCHC: 32.3 g/dL (ref 30.0–36.0)
MCV: 89.4 fL (ref 80.0–100.0)
Monocytes Absolute: 1.7 10*3/uL — ABNORMAL HIGH (ref 0.1–1.0)
Monocytes Relative: 18 %
Neutro Abs: 6.1 10*3/uL (ref 1.7–7.7)
Neutrophils Relative %: 67 %
Platelet Count: 808 10*3/uL — ABNORMAL HIGH (ref 150–400)
RBC: 3.85 MIL/uL — ABNORMAL LOW (ref 4.22–5.81)
RDW: 15.7 % — ABNORMAL HIGH (ref 11.5–15.5)
WBC Count: 9.1 10*3/uL (ref 4.0–10.5)
nRBC: 0 % (ref 0.0–0.2)

## 2020-01-09 LAB — CMP (CANCER CENTER ONLY)
ALT: 20 U/L (ref 0–44)
AST: 25 U/L (ref 15–41)
Albumin: 3.3 g/dL — ABNORMAL LOW (ref 3.5–5.0)
Alkaline Phosphatase: 144 U/L — ABNORMAL HIGH (ref 38–126)
Anion gap: 11 (ref 5–15)
BUN: 17 mg/dL (ref 8–23)
CO2: 25 mmol/L (ref 22–32)
Calcium: 9.6 mg/dL (ref 8.9–10.3)
Chloride: 101 mmol/L (ref 98–111)
Creatinine: 0.81 mg/dL (ref 0.61–1.24)
GFR, Est AFR Am: >60 mL/min (ref >60)
GFR, Estimated: >60 mL/min (ref >60)
Glucose, Bld: 116 mg/dL — ABNORMAL HIGH (ref 70–99)
Potassium: 4 mmol/L (ref 3.5–5.1)
Sodium: 137 mmol/L (ref 135–145)
Total Bilirubin: 0.6 mg/dL (ref 0.3–1.2)
Total Protein: 7.2 g/dL (ref 6.5–8.1)

## 2020-01-09 LAB — FERRITIN: Ferritin: 1252 ng/mL — ABNORMAL HIGH (ref 24–336)

## 2020-01-09 LAB — CEA (IN HOUSE-CHCC): CEA (CHCC-In House): 1.06 ng/mL (ref 0.00–5.00)

## 2020-01-09 MED ORDER — MIRTAZAPINE 7.5 MG PO TABS: 7.5000 mg | ORAL_TABLET | Freq: Every day | ORAL | 1 refills | Status: DC

## 2020-01-09 MED ORDER — SODIUM CHLORIDE 0.9% FLUSH
10.0000 mL | INTRAVENOUS | Status: DC | PRN
  Administered 2020-01-09: 10 mL
  Filled 2020-01-09: qty 10

## 2020-01-09 MED ORDER — HEPARIN SOD (PORK) LOCK FLUSH 100 UNIT/ML IV SOLN
500.0000 [IU] | Freq: Once | INTRAVENOUS | Status: AC | PRN
  Administered 2020-01-09: 500 [IU]
  Filled 2020-01-09: qty 5

## 2020-01-10 ENCOUNTER — Telehealth: Payer: Self-pay | Admitting: Hematology

## 2020-01-10 NOTE — Telephone Encounter (Signed)
Scheduled appt per 1/27 los.  Sent a message to HIM pool to have a calendar mailed out. 

## 2020-02-06 NOTE — Progress Notes (Signed)
[place]   [phone] [phone]   Clinic Follow up Note   Patient Care Team: Lajean Manes, MD as PCP - General (Internal Medicine) Berle Mull, MD as Consulting Physician (Family Medicine) Clarene Essex, MD as Consulting Physician (Gastroenterology)  Date of Service:  02/08/2020  CHIEF COMPLAINT: F/u ofmetastaticcolon cancer  SUMMARY OF ONCOLOGIC HISTORY: Oncology History Overview Note  Cancer Staging Cancer of right colon [facility]) Staging form: Colon and Rectum, AJCC 8th Edition - Clinical stage from 04/09/2019: Stage IVC (cTX, cNX, pM1c) - Signed by Truitt Merle, MD on 05/03/2019     Cancer of right colon [facility])  04/09/2019 Procedure   Colonoscopy 04/09/19 by Dr Watt Climes IMPRESSION -internal hemorrhoids -Diverticulosis in the sigmoid colon  -2 small polyps in the rectum and in the proximal transverse colon, removed with a hot snare. Resected and retrieved.  -3 medium polyps in the proximal transverse colon, in the mid transverse colon and in the distal transverse colon, removed and resected and retrieved.  -likely malignant partially obstructing tumor at the hepatic flexure. biopsied, tattooed.  -1 large polyp in the mid ascending colon  -the examination was otherwise normal     04/09/2019 Initial Biopsy   FINAL MICROSCOPIC DIAGNOSIS: 04/09/19 1. LG intestine-hepatic flexure, Biopsy:   INVASIVE WELL DIFFERENTIATED ADENOCARCINOMA   04/09/2019 Cancer Staging   Staging form: Colon and Rectum, AJCC 8th Edition - Clinical stage from 04/09/2019: Stage IVC (cTX, cNX, pM1c) - Signed by Truitt Merle, MD on 05/03/2019   04/10/2019 Imaging   CT AP 04/10/19  IMPRESSION: 1. There is an eccentric mass of the colon involving the ascending colon near the hepatic flexure measuring approximately 3.4 x 3.4 by 2.0 cm (series 2, image 37, series 3, image 37). There is extensive soft tissue nodularity of the mesocolon and omentum, and likely areas of the  peritoneum, for example bilateral upper quadrants (series 2, image 25). Findings are consistent with primary colon malignancy, probable omental and peritoneal involvement, and small volume malignant ascites. 2.  Other chronic and incidental findings as detailed above.   04/20/2019 Initial Diagnosis   Cancer of right colon ([place])   04/26/2019 Imaging   CT Chest 04/26/19 IMPRESSION: 1. Borderline to mild lower thoracic adenopathy, including within the right internal mammary and juxta cardiophrenic stations. Given the appearance of the upper abdomen, suspicious for nodal metastasis. 2. A low right paratracheal node is borderline sized, but favored to be reactive. 3. No evidence of pulmonary metastasis. 4. Peritoneal metastasis and abdominal ascites, as before. 5. Pancreatic parenchymal calcifications indicative of chronic calcific pancreatitis. 6. Coronary artery atherosclerosis.   04/27/2019 Pathology Results   Diagnosis 04/27/19 Peritoneum, biopsy, right upper quadrant, perihepatic - ADENOCARCINOMA, CONSISTENT WITH COLONIC PRIMARY. - SEE COMMENT.   05/16/2019 - 10/31/2019 Chemotherapy   First line FOLFOX every 2 weeks with Avastin starting with cycle 2 starting 05/16/19. 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 Stopped after 10/31/19 for surgery.    07/23/2019 Imaging    CT AP W Contrast  IMPRESSION: 1. Primary ascending colon mass may be minimally smaller. Peritoneal metastatic disease appears slightly improved as well. 2. Chronic calcific pancreatitis. 3. Tiny left renal stone. 4. Small left inguinal hernia contains a knuckle of unobstructed colon. 5. Enlarged prostate.   10/29/2019 Imaging   CT AP W Contrast IMPRESSION: No significant change in small ascending colon soft tissue  mass and diffuse omental  carcinomatosis.   No new or progressive metastatic disease identified.   Stable small left inguinal hernia containing a loop of sigmoid colon. No evidence of bowel obstruction or strangulation.   Colonic diverticulosis. No radiographic evidence of diverticulitis.   Stable enlarged prostate.   12/17/2019 Pathology Results   HIPEC Surgery by Dr Clovis Riley 12/17/19 FINAL PATHOLOGIC DIAGNOSIS  MICROSCOPIC EXAMINATION AND DIAGNOSIS  A.          "RIGHT COLON, OMENTUM, SPLEEN":       Invasive adenocarcinoma with mucinous features, moderately differentiated.  Tumor involves mesentery and spleen.  Five out of ten lymph nodes involved by adenocarcinoma with perinodal soft tissue involvement (5/10), see comment.  Margins are uninvolved by invasive carcinoma.  Ileum and appendix, uninvolved.  See comment and cancer case summary.  B.          "ROUND LIGAMENT OF LIVER":       Involved by adenocarcinoma with mucinous features, moderately differentiated.  C.          "RIGHT DIAPHRAM STRIPPING":       Negative for carcinoma.  D.          "RIGHT HEPATIC CAPSULECTOMY":       Chronic inflammation and fibrosis.  Negative for carcinoma.  E.          "DEBRIDED TUMOR":       Involved by adenocarcinoma with mucinous features, moderately differentiated.   COMMENT: Sections disclose five out of ten lymph nodes involved by adenocarcinoma with perinodal soft tissue involvement. However, it is not possible to be certain if this represents lymph nodes replaced by tumor and/or soft tissue involvement. In addition, focal perineural invasion is seen.      CURRENT THERAPY:  Supportive Care and Surveillance   INTERVAL HISTORY:  Nora Rooke is here for a follow up. He presents to the clinic alone. He feels like he is improving from surgery. He notes he is still losing weight. He continues to increase eating intake. He can eat at least half of each meal. He has Boost but does not taste good to him.  He notes he started to be active at ome in the last week. He denies abdominal pain and nausea. His BM are mostly normal now. He does have temporarily abdominal discomfort at night 2-3 times a week. He attributes to diet. He notes stable Rosacea and had recent flare of acne rash on face. Has resolved now. He notes he tried Mirtazapine which helped him fall asleep fast but did not keep him asleep. He did during the morning he felt drowsy and did not want to get out of bed.   He plans to f/u with Dr. Clovis Riley in 06/2020 with CT scan.     REVIEW OF SYSTEMS:   Constitutional: Denies fevers, chills (+) increased eating, weight loss Eyes: Denies blurriness of vision Ears, nose, mouth, throat, and face: Denies mucositis or sore throat Respiratory: Denies cough, dyspnea or wheezes Cardiovascular: Denies palpitation, chest discomfort or lower extremity swelling Gastrointestinal:  Denies nausea, heartburn or change in bowel habits Skin: Denies abnormal skin rashes (+) Facial Rosacea  Lymphatics: Denies new lymphadenopathy or easy bruising Neurological:Denies numbness, tingling or new weaknesses Behavioral/Psych: Mood is stable, no new changes  All other systems were reviewed with the patient and are negative.  MEDICAL HISTORY:  Past Medical History:  Diagnosis Date  . colon ca dx'd 03/2019    SURGICAL HISTORY: Past Surgical History:  Procedure Laterality Date  .  CATARACT EXTRACTION    . IR IMAGING GUIDED PORT INSERTION  05/10/2019  . L4 fracture  2012  . RETINAL DETACHMENT SURGERY    . Right hand surgery  1974    I have reviewed the social history and family history with the patient and they are unchanged from previous note.  ALLERGIES:  is allergic to feraheme [ferumoxytol] and codeine.  MEDICATIONS:  Current Outpatient Medications  Medication Sig Dispense Refill  . ferrous sulfate 325 (65 FE) MG tablet Take 325 mg by mouth daily with breakfast.    . fluticasone (FLONASE) 50 MCG/ACT nasal  spray Place into the nose.    . lidocaine-prilocaine (EMLA) cream Apply to affected area once 30 g 3  . mirtazapine (REMERON) 7.5 MG tablet Take 1 tablet (7.5 mg total) by mouth at bedtime. 30 tablet 1  . Multiple Vitamin (MULTIVITAMIN) tablet Take 1 tablet by mouth daily.    . ondansetron (ZOFRAN) 8 MG tablet Take 1 tablet (8 mg total) by mouth 2 (two) times daily as needed for refractory nausea / vomiting. Start on day 3 after chemotherapy. 30 tablet 1  . prochlorperazine (COMPAZINE) 10 MG tablet Take 1 tablet (10 mg total) by mouth every 6 (six) hours as needed (Nausea or vomiting). 30 tablet 1   No current facility-administered medications for this visit.    PHYSICAL EXAMINATION: ECOG PERFORMANCE STATUS: 1  Vitals:   02/08/20 1058  BP: 108/70  Pulse: 81  Resp: 17  Temp: 97.8 F (36.6 C)  SpO2: 100%   Filed Weights   02/08/20 1058  Weight: 143 lb 14.4 oz (65.3 kg)    GENERAL:alert, no distress and comfortable SKIN: skin color, texture, turgor are normal, no rashes or significant lesions (+) Facial Rosacea  EYES: normal, Conjunctiva are pink and non-injected, sclera clear  NECK: supple, thyroid normal size, non-tender, without nodularity LYMPH:  no palpable lymphadenopathy in the cervical, axillary  LUNGS: clear to auscultation and percussion with normal breathing effort HEART: regular rate & rhythm and no murmurs and no lower extremity edema ABDOMEN:abdomen soft, non-tender and normal bowel sounds (+) Midline surgical incision healed well with scar tissue  Musculoskeletal:no cyanosis of digits and no clubbing  NEURO: alert & oriented x 3 with fluent speech, no focal motor/sensory deficits  LABORATORY DATA:  I have reviewed the data as listed CBC Latest Ref Rng & Units 02/08/2020 01/09/2020 11/14/2019  WBC 4.0 - 10.5 K/uL 7.5 9.1 6.1  Hemoglobin 13.0 - 17.0 g/dL 11.4(L) 11.1(L) 14.2  Hematocrit 39.0 - 52.0 % 35.9(L) 34.4(L) 42.9  Platelets 150 - 400 K/uL 347 808(H) 106(L)       CMP Latest Ref Rng & Units 02/08/2020 01/09/2020 11/14/2019  Glucose 70 - 99 mg/dL 129(H) 116(H) 98  BUN 8 - 23 mg/dL '10 17 16  ' Creatinine 0.61 - 1.24 mg/dL 0.73 0.81 0.94  Sodium 135 - 145 mmol/L 138 137 141  Potassium 3.5 - 5.1 mmol/L 4.3 4.0 4.6  Chloride 98 - 111 mmol/L 103 101 105  CO2 22 - 32 mmol/L '28 25 27  ' Calcium 8.9 - 10.3 mg/dL 9.3 9.6 9.9  Total Protein 6.5 - 8.1 g/dL 6.9 7.2 7.2  Total Bilirubin 0.3 - 1.2 mg/dL 0.7 0.6 1.2  Alkaline Phos 38 - 126 U/L 133(H) 144(H) 103  AST 15 - 41 U/L '26 25 20  ' ALT 0 - 44 U/L '25 20 21      ' RADIOGRAPHIC STUDIES: I have personally reviewed the radiological images as listed and agreed with  the findings in the report. No results found.   ASSESSMENT & PLAN:  Nathaniel Norton is a 69 y.o. male with    1. Cancer of right colon,with peritoneal metastasis,stage IV,MMR normal, KRAS mutation (+), MSS -He wasrecentlydiagnosed in 03/2019. His Colonoscopy biopsy showed adenocarcinoma of right hepatic flexurecolon.His peritoneal biopsy from 04/27/19 showsadenocarcinoma,consistent withmetastasis of his colon cancer. This is now stage IV disease which is no longer curable but still treatable. -He startedfirst line FOLFOX q2weekson6/3/20, Avastin added with cycle 2.Dose moderately reduce starting with C9 due to neuropathy.Oxaliplatinwas stoppedfrom C11 due to thrombocytopenia and neuropathy. Last dose chemo given 10/31/19 to proceed with surgery.  -He was previously seen by Dr.Levine and he underwent HIPEC surgery on 12/17/19.  -I discussed I do not plan to give more chemotherapy at this time. He will continue with cancer surveillance post surgery. -After mild complications from surgery almost 2 months ago, he is improving in the last few weeks. His BM are mostly normal and controlled, he has recently started being more active and he has increased in eating but weight still trending down. I reviewed supportive care with him.   -Physical exam today is normal. Labs reviewed, with mild anemia, BG 129, Alk Phos 133, albumin 3.1. CEA and Iron panel still pending.  -We discussed high risk of cancer recurrence, will do CT more frequent in first year after surgery. I will see him back in 03/2020 with CT scan. He will sees Dr. Clovis Riley with scan in July    2. Supportive Care: No appetite/Low intake, weight loss, trouble sleeping, urine urgency  -After his surgery he had 11 day stay in hospital. He had AKI and developed bed sore. Since discharge he has been having no appetite, barely eating and has lost 11 pounds, trouble sleeping, diarrhea, Urinary urgency. -He will have urinary urgency and frequency with low output. I discussed this is related to surgery. If he has incontinence he should f/u with Dr. Clovis Riley  -Almost 2 months from surgery he continues to improve overall.  -His eating has increased with eating at least 1/2 of eat meal, but still losing weight. I recommend high protein, high calorie diet. He can use boost or protein powder in milkshakes or dilute it given he does not like taste.  -He has started being active at home in the last 1-2 weeks with yard and housework. I encouraged him to continue to increase activity and mild exercise if feasible.  -His BM are mostly normal now. He does have temporarily abdominal discomfort at night 2-3 times a week. He attributes to diet.  -He tried Mirtazapine for sleep and appetite, but caused residual drowsiness in the morning. He can try half tablet at a time or he is fine to stop given improved appetite.   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 Watt Climes showed internal hemorrhoids, benign polyps, diverticulosis and colon cancer.  -Since 03/2019 he has been onOTC multivitamin and oral iron in late 03/2019. -He has been treated withIV Feraheme on 05/03/19 and 05/16/19. He did have reaction to 2nd infusionand infusion  was stopped.Will give another type of iron in the future if needed. -Mild currently and stable.   4. Elevated PSA  -PSA was 5.7 in 11/2018 and 4.6 in 01/2019 -He plans to wait on seeing Urologist for work up given colon cancer diagnosis. Thisis understandable.   5. Social Support -He is single with no children and no family that [place]. -He lives  alone in [place] level [place]. -He does not have Internet access at home.He does have a cell phone which he can be reached. -If he needs help with transportation for treatment, I discussed our social workers can help him.  -I encouraged him to find someone to help him as needed at home. -He has been having a lot of symptoms since HIPEC surgery. He is able to manage at home alone and has help from neighbors.   6.Goal of care discussion  -The patient understands the goal of care is palliative. -he is full code now  7. Mild neuropathy  -S/p C7 FOLFOX, mainly in hands.Mostlycold related neuropathy. -Oxaliplatin dose reduced more than 50% starting with C9. D/c since C11 due to neuropathy.  -Mostly stable and mainly in handsand lasts longer.    PLAN: -He is recovering better  -F/u in 8-9 weeks with lab and CT AP a few day s before.     No problem-specific Assessment & Plan notes found for this encounter.   Orders Placed This Encounter  Procedures  . CT Abdomen Pelvis W Contrast    Standing Status:   Future    Standing Expiration Date:   02/07/2021    Order Specific Question:   If indicated for the ordered procedure, I authorize the administration of contrast media per Radiology protocol    Answer:   Yes    Order Specific Question:   Preferred imaging location?    Answer:   [facility]    Order Specific Question:   Is Oral Contrast requested for this exam?    Answer:   Yes, Per Radiology protocol    Order Specific Question:   Radiology Contrast Protocol - do NOT remove file path     Answer:   [email or web address]   All questions were answered. The patient knows to call the clinic with any problems, questions or concerns. No barriers to learning was detected. The total time spent in the appointment was 30 minutes.     Truitt Merle, MD 02/08/2020   I, Joslyn Devon, am acting as scribe for Truitt Merle, MD.   I have reviewed the above documentation for accuracy and completeness, and I agree with the above.

## 2020-02-08 ENCOUNTER — Inpatient Hospital Stay: Payer: Medicare Other | Attending: Hematology | Admitting: Hematology

## 2020-02-08 ENCOUNTER — Inpatient Hospital Stay: Payer: Medicare Other

## 2020-02-08 ENCOUNTER — Other Ambulatory Visit: Payer: Self-pay

## 2020-02-08 ENCOUNTER — Encounter: Payer: Self-pay | Admitting: Hematology

## 2020-02-08 VITALS — BP 108/70 | HR 81 | Temp 97.8°F | Resp 17 | Ht 66.0 in | Wt 143.9 lb

## 2020-02-08 DIAGNOSIS — R634 Abnormal weight loss: Secondary | ICD-10-CM | POA: Diagnosis not present

## 2020-02-08 DIAGNOSIS — G629 Polyneuropathy, unspecified: Secondary | ICD-10-CM | POA: Insufficient documentation

## 2020-02-08 DIAGNOSIS — C786 Secondary malignant neoplasm of retroperitoneum and peritoneum: Secondary | ICD-10-CM | POA: Diagnosis present

## 2020-02-08 DIAGNOSIS — R972 Elevated prostate specific antigen [PSA]: Secondary | ICD-10-CM | POA: Insufficient documentation

## 2020-02-08 DIAGNOSIS — C182 Malignant neoplasm of ascending colon: Secondary | ICD-10-CM | POA: Diagnosis present

## 2020-02-08 DIAGNOSIS — R400 Somnolence: Secondary | ICD-10-CM | POA: Diagnosis not present

## 2020-02-08 DIAGNOSIS — D500 Iron deficiency anemia secondary to blood loss (chronic): Secondary | ICD-10-CM

## 2020-02-08 DIAGNOSIS — D649 Anemia, unspecified: Secondary | ICD-10-CM | POA: Diagnosis not present

## 2020-02-08 DIAGNOSIS — Z79899 Other long term (current) drug therapy: Secondary | ICD-10-CM | POA: Diagnosis not present

## 2020-02-08 DIAGNOSIS — Z95828 Presence of other vascular implants and grafts: Secondary | ICD-10-CM

## 2020-02-08 LAB — CBC WITH DIFFERENTIAL (CANCER CENTER ONLY)
Abs Immature Granulocytes: 0.02 10*3/uL (ref 0.00–0.07)
Basophils Absolute: 0 10*3/uL (ref 0.0–0.1)
Basophils Relative: 1 %
Eosinophils Absolute: 0.1 10*3/uL (ref 0.0–0.5)
Eosinophils Relative: 2 %
HCT: 35.9 % — ABNORMAL LOW (ref 39.0–52.0)
Hemoglobin: 11.4 g/dL — ABNORMAL LOW (ref 13.0–17.0)
Immature Granulocytes: 0 %
Lymphocytes Relative: 35 %
Lymphs Abs: 2.6 10*3/uL (ref 0.7–4.0)
MCH: 27 pg (ref 26.0–34.0)
MCHC: 31.8 g/dL (ref 30.0–36.0)
MCV: 84.9 fL (ref 80.0–100.0)
Monocytes Absolute: 0.8 10*3/uL (ref 0.1–1.0)
Monocytes Relative: 11 %
Neutro Abs: 3.9 10*3/uL (ref 1.7–7.7)
Neutrophils Relative %: 51 %
Platelet Count: 347 10*3/uL (ref 150–400)
RBC: 4.23 MIL/uL (ref 4.22–5.81)
RDW: 17.2 % — ABNORMAL HIGH (ref 11.5–15.5)
WBC Count: 7.5 10*3/uL (ref 4.0–10.5)
nRBC: 0 % (ref 0.0–0.2)

## 2020-02-08 LAB — IRON AND TIBC
Iron: 42 ug/dL (ref 42–163)
Saturation Ratios: 15 % — ABNORMAL LOW (ref 20–55)
TIBC: 278 ug/dL (ref 202–409)
UIBC: 236 ug/dL (ref 117–376)

## 2020-02-08 LAB — CMP (CANCER CENTER ONLY)
ALT: 25 U/L (ref 0–44)
AST: 26 U/L (ref 15–41)
Albumin: 3.1 g/dL — ABNORMAL LOW (ref 3.5–5.0)
Alkaline Phosphatase: 133 U/L — ABNORMAL HIGH (ref 38–126)
Anion gap: 7 (ref 5–15)
BUN: 10 mg/dL (ref 8–23)
CO2: 28 mmol/L (ref 22–32)
Calcium: 9.3 mg/dL (ref 8.9–10.3)
Chloride: 103 mmol/L (ref 98–111)
Creatinine: 0.73 mg/dL (ref 0.61–1.24)
GFR, Est AFR Am: >60 mL/min (ref >60)
GFR, Estimated: >60 mL/min (ref >60)
Glucose, Bld: 129 mg/dL — ABNORMAL HIGH (ref 70–99)
Potassium: 4.3 mmol/L (ref 3.5–5.1)
Sodium: 138 mmol/L (ref 135–145)
Total Bilirubin: 0.7 mg/dL (ref 0.3–1.2)
Total Protein: 6.9 g/dL (ref 6.5–8.1)

## 2020-02-08 LAB — CEA (IN HOUSE-CHCC): CEA (CHCC-In House): 1.35 ng/mL (ref 0.00–5.00)

## 2020-02-08 LAB — FERRITIN: Ferritin: 306 ng/mL (ref 24–336)

## 2020-02-08 MED ORDER — SODIUM CHLORIDE 0.9% FLUSH
10.0000 mL | INTRAVENOUS | Status: DC | PRN
  Administered 2020-02-08: 11:00:00 10 mL
  Filled 2020-02-08: qty 10

## 2020-02-08 MED ORDER — HEPARIN SOD (PORK) LOCK FLUSH 100 UNIT/ML IV SOLN
500.0000 [IU] | Freq: Once | INTRAVENOUS | Status: AC | PRN
  Administered 2020-02-08: 500 [IU]
  Filled 2020-02-08: qty 5

## 2020-02-11 ENCOUNTER — Telehealth: Payer: Self-pay | Admitting: Hematology

## 2020-02-11 NOTE — Telephone Encounter (Signed)
Scheduled appt per 2/26 los.  Sent a message to HIM pool to get a calendar mailed out. 

## 2020-04-03 ENCOUNTER — Inpatient Hospital Stay: Payer: Medicare Other | Attending: Hematology

## 2020-04-03 ENCOUNTER — Inpatient Hospital Stay: Payer: Medicare Other

## 2020-04-03 ENCOUNTER — Other Ambulatory Visit: Payer: Self-pay

## 2020-04-03 ENCOUNTER — Ambulatory Visit (HOSPITAL_COMMUNITY)
Admission: RE | Admit: 2020-04-03 | Discharge: 2020-04-03 | Disposition: A | Discharge status: Home / Self Care | Payer: Medicare Other | Source: Ambulatory Visit | Attending: Hematology | Admitting: Hematology

## 2020-04-03 DIAGNOSIS — C182 Malignant neoplasm of ascending colon: Secondary | ICD-10-CM | POA: Diagnosis present

## 2020-04-03 DIAGNOSIS — Z95828 Presence of other vascular implants and grafts: Secondary | ICD-10-CM

## 2020-04-03 DIAGNOSIS — C786 Secondary malignant neoplasm of retroperitoneum and peritoneum: Secondary | ICD-10-CM | POA: Insufficient documentation

## 2020-04-03 DIAGNOSIS — D500 Iron deficiency anemia secondary to blood loss (chronic): Secondary | ICD-10-CM

## 2020-04-03 LAB — CMP (CANCER CENTER ONLY)
ALT: 12 U/L (ref 0–44)
AST: 21 U/L (ref 15–41)
Albumin: 3.7 g/dL (ref 3.5–5.0)
Alkaline Phosphatase: 69 U/L (ref 38–126)
Anion gap: 9 (ref 5–15)
BUN: 10 mg/dL (ref 8–23)
CO2: 27 mmol/L (ref 22–32)
Calcium: 9.6 mg/dL (ref 8.9–10.3)
Chloride: 104 mmol/L (ref 98–111)
Creatinine: 0.82 mg/dL (ref 0.61–1.24)
GFR, Est AFR Am: >60 mL/min (ref >60)
GFR, Estimated: >60 mL/min (ref >60)
Glucose, Bld: 95 mg/dL (ref 70–99)
Potassium: 4.1 mmol/L (ref 3.5–5.1)
Sodium: 140 mmol/L (ref 135–145)
Total Bilirubin: 1 mg/dL (ref 0.3–1.2)
Total Protein: 7.1 g/dL (ref 6.5–8.1)

## 2020-04-03 LAB — CBC WITH DIFFERENTIAL (CANCER CENTER ONLY)
Abs Immature Granulocytes: 0.01 10*3/uL (ref 0.00–0.07)
Basophils Absolute: 0.1 10*3/uL (ref 0.0–0.1)
Basophils Relative: 1 %
Eosinophils Absolute: 0.1 10*3/uL (ref 0.0–0.5)
Eosinophils Relative: 2 %
HCT: 41.1 % (ref 39.0–52.0)
Hemoglobin: 13.3 g/dL (ref 13.0–17.0)
Immature Granulocytes: 0 %
Lymphocytes Relative: 36 %
Lymphs Abs: 2.6 10*3/uL (ref 0.7–4.0)
MCH: 27.1 pg (ref 26.0–34.0)
MCHC: 32.4 g/dL (ref 30.0–36.0)
MCV: 83.7 fL (ref 80.0–100.0)
Monocytes Absolute: 1.1 10*3/uL — ABNORMAL HIGH (ref 0.1–1.0)
Monocytes Relative: 15 %
Neutro Abs: 3.4 10*3/uL (ref 1.7–7.7)
Neutrophils Relative %: 46 %
Platelet Count: 267 10*3/uL (ref 150–400)
RBC: 4.91 MIL/uL (ref 4.22–5.81)
RDW: 19.7 % — ABNORMAL HIGH (ref 11.5–15.5)
WBC Count: 7.3 10*3/uL (ref 4.0–10.5)
nRBC: 0 % (ref 0.0–0.2)

## 2020-04-03 LAB — FERRITIN: Ferritin: 90 ng/mL (ref 24–336)

## 2020-04-03 LAB — CEA (IN HOUSE-CHCC): CEA (CHCC-In House): 1.29 ng/mL (ref 0.00–5.00)

## 2020-04-03 MED ORDER — HEPARIN SOD (PORK) LOCK FLUSH 100 UNIT/ML IV SOLN
INTRAVENOUS | Status: AC
  Filled 2020-04-03: qty 5

## 2020-04-03 MED ORDER — SODIUM CHLORIDE 0.9% FLUSH
10.0000 mL | INTRAVENOUS | Status: DC | PRN
  Administered 2020-04-03: 10 mL
  Filled 2020-04-03: qty 10

## 2020-04-03 MED ORDER — HEPARIN SOD (PORK) LOCK FLUSH 100 UNIT/ML IV SOLN
500.0000 [IU] | Freq: Once | INTRAVENOUS | Status: AC
  Administered 2020-04-03: 500 [IU] via INTRAVENOUS

## 2020-04-03 MED ORDER — SODIUM CHLORIDE (PF) 0.9 % IJ SOLN
INTRAMUSCULAR | Status: AC
  Filled 2020-04-03: qty 50

## 2020-04-03 MED ORDER — IOHEXOL 300 MG/ML  SOLN
100.0000 mL | Freq: Once | INTRAMUSCULAR | Status: AC | PRN
  Administered 2020-04-03: 100 mL via INTRAVENOUS

## 2020-04-07 NOTE — Progress Notes (Signed)
[place]   Telephone:(336) [phone] Fax:(336) [phone]   Clinic Follow up Note   Patient Care Team: Lajean Manes, MD as PCP - General (Internal Medicine) Berle Mull, MD as Consulting Physician (Family Medicine) Clarene Essex, MD as Consulting Physician (Gastroenterology)  Date of Service:  04/09/2020  CHIEF COMPLAINT: F/u ofmetastaticcolon cancer  SUMMARY OF ONCOLOGIC HISTORY: Oncology History Overview Note  Cancer Staging Cancer of right colon [facility]) Staging form: Colon and Rectum, AJCC 8th Edition - Clinical stage from 04/09/2019: Stage IVC (cTX, cNX, pM1c) - Signed by Truitt Merle, MD on 05/03/2019     Cancer of right colon [facility])  04/09/2019 Procedure   Colonoscopy 04/09/19 by Dr Watt Climes IMPRESSION -internal hemorrhoids -Diverticulosis in the sigmoid colon  -2 small polyps in the rectum and in the proximal transverse colon, removed with a hot snare. Resected and retrieved.  -3 medium polyps in the proximal transverse colon, in the mid transverse colon and in the distal transverse colon, removed and resected and retrieved.  -likely malignant partially obstructing tumor at the hepatic flexure. biopsied, tattooed.  -1 large polyp in the mid ascending colon  -the examination was otherwise normal     04/09/2019 Initial Biopsy   FINAL MICROSCOPIC DIAGNOSIS: 04/09/19 1. LG intestine-hepatic flexure, Biopsy:   INVASIVE WELL DIFFERENTIATED ADENOCARCINOMA   04/09/2019 Cancer Staging   Staging form: Colon and Rectum, AJCC 8th Edition - Clinical stage from 04/09/2019: Stage IVC (cTX, cNX, pM1c) - Signed by Truitt Merle, MD on 05/03/2019   04/10/2019 Imaging   CT AP 04/10/19  IMPRESSION: 1. There is an eccentric mass of the colon involving the ascending colon near the hepatic flexure measuring approximately 3.4 x 3.4 by 2.0 cm (series 2, image 37, series 3, image 37). There is extensive soft tissue nodularity of the mesocolon and omentum, and likely areas of the  peritoneum, for example bilateral upper quadrants (series 2, image 25). Findings are consistent with primary colon malignancy, probable omental and peritoneal involvement, and small volume malignant ascites. 2.  Other chronic and incidental findings as detailed above.   04/20/2019 Initial Diagnosis   Cancer of right colon ([place])   04/26/2019 Imaging   CT Chest 04/26/19 IMPRESSION: 1. Borderline to mild lower thoracic adenopathy, including within the right internal mammary and juxta cardiophrenic stations. Given the appearance of the upper abdomen, suspicious for nodal metastasis. 2. A low right paratracheal node is borderline sized, but favored to be reactive. 3. No evidence of pulmonary metastasis. 4. Peritoneal metastasis and abdominal ascites, as before. 5. Pancreatic parenchymal calcifications indicative of chronic calcific pancreatitis. 6. Coronary artery atherosclerosis.   04/27/2019 Pathology Results   Diagnosis 04/27/19 Peritoneum, biopsy, right upper quadrant, perihepatic - ADENOCARCINOMA, CONSISTENT WITH COLONIC PRIMARY. - SEE COMMENT.   05/16/2019 - 10/31/2019 Chemotherapy   First line FOLFOX every 2 weeks with Avastin starting with cycle 2 starting 05/16/19. 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 Stopped after 10/31/19 for surgery.    07/23/2019 Imaging    CT AP W Contrast  IMPRESSION: 1. Primary ascending colon mass may be minimally smaller. Peritoneal metastatic disease appears slightly improved as well. 2. Chronic calcific pancreatitis. 3. Tiny left renal stone. 4. Small left inguinal hernia contains a knuckle of unobstructed colon. 5. Enlarged prostate.   10/29/2019 Imaging   CT AP W Contrast IMPRESSION: No significant change in small ascending colon soft tissue  mass and diffuse omental  carcinomatosis.   No new or progressive metastatic disease identified.   Stable small left inguinal hernia containing a loop of sigmoid colon. No evidence of bowel obstruction or strangulation.   Colonic diverticulosis. No radiographic evidence of diverticulitis.   Stable enlarged prostate.   12/17/2019 Pathology Results   HIP[facility] Surgery by Dr Clovis Riley 12/17/19 FINAL PATHOLOGIC DIAGNOSIS  MICROSCOPIC EXAMINATION AND DIAGNOSIS  A.          "RIGHT COLON, OMENTUM, SPLEEN":       Invasive adenocarcinoma with mucinous features, moderately differentiated.  Tumor involves mesentery and spleen.  Five out of ten lymph nodes involved by adenocarcinoma with perinodal soft tissue involvement (5/10), see comment.  Margins are uninvolved by invasive carcinoma.  Ileum and appendix, uninvolved.  See comment and cancer case summary.  B.          "ROUND LIGAMENT OF LIVER":       Involved by adenocarcinoma with mucinous features, moderately differentiated.  C.          "RIGHT DIAPHRAM STRIPPING":       Negative for carcinoma.  D.          "RIGHT HEPATIC CAPSULECTOMY":       Chronic inflammation and fibrosis.  Negative for carcinoma.  E.          "DEBRIDED TUMOR":       Involved by adenocarcinoma with mucinous features, moderately differentiated.   COMMENT: Sections disclose five out of ten lymph nodes involved by adenocarcinoma with perinodal soft tissue involvement. However, it is not possible to be certain if this represents lymph nodes replaced by tumor and/or soft tissue involvement. In addition, focal perineural invasion is seen.   04/03/2020 Imaging   CT AP W contrast  IMPRESSION: 1. Status post interval splenectomy and right hemicolectomy. 2. Response to therapy of omental/peritoneal metastasis. The only residual indeterminate finding is fluid density along the capsule of the hepatic dome which could be postoperative or secondary to localized residual peritoneal disease. 3. Left  nephrolithiasis. 4.  Possible constipation. 5. Prostatomegaly. 6.  Aortic Atherosclerosis (ICD10-I70.0).      CURRENT THERAPY:  Surveillance   INTERVAL HISTORY:  Nathaniel Norton is here for a follow up. He presents to the clinic alone. He notes he is doing well. He feels he has gas and stomach rumbles. He notes he gained weight but in his stomach. He note he has not gain much muscle and he is not able to pick up much weights. He does more aerobics. He notes occasional dull pain in stomach based on how he moves. He notes regular BM with occasional loose stool unexpectedly. He notes his struggle to urinate has much improved and only occasionally. He plans to see Dr Clovis Riley in 06/2020.     REVIEW OF SYSTEMS:   Constitutional: Denies fevers, chills or abnormal weight loss Eyes: Denies blurriness of vision Ears, nose, mouth, throat, and face: Denies mucositis or sore throat Respiratory: Denies cough, dyspnea or wheezes Cardiovascular: Denies palpitation, chest discomfort or lower extremity swelling Gastrointestinal:  Denies nausea, heartburn or change in bowel habits (+) gas (+) Occasional dull abdominal pain  Skin: Denies abnormal skin rashes Lymphatics: Denies new lymphadenopathy or easy bruising Neurological:Denies numbness, tingling or new weaknesses Behavioral/Psych: Mood is stable, no new changes  All other systems were reviewed with the patient and are negative.   MEDICAL HISTORY:  Past Medical History:  Diagnosis Date  . colon ca dx'd 03/2019    SURGICAL  HISTORY: Past Surgical History:  Procedure Laterality Date  . CATARACT EXTRACTION    . IR IMAGING GUIDED PORT INSERTION  05/10/2019  . L4 fracture  2012  . RETINAL DETACHMENT SURGERY    . Right hand surgery  1974    I have reviewed the social history and family history with the patient and they are unchanged from previous note.  ALLERGIES:  is allergic to feraheme [ferumoxytol] and codeine.  MEDICATIONS:  Current  Outpatient Medications  Medication Sig Dispense Refill  . ferrous sulfate 325 (65 FE) MG tablet Take 325 mg by mouth daily with breakfast.    . fluticasone (FLONASE) 50 MCG/ACT nasal spray Place into the nose.    . lidocaine-prilocaine (EMLA) cream Apply to affected area once 30 g 3  . mirtazapine (REMERON) 7.5 MG tablet Take 1 tablet (7.5 mg total) by mouth at bedtime. 30 tablet 1  . Multiple Vitamin (MULTIVITAMIN) tablet Take 1 tablet by mouth daily.    . ondansetron (ZOFRAN) 8 MG tablet Take 1 tablet (8 mg total) by mouth 2 (two) times daily as needed for refractory nausea / vomiting. Start on day 3 after chemotherapy. 30 tablet 1  . prochlorperazine (COMPAZINE) 10 MG tablet Take 1 tablet (10 mg total) by mouth every 6 (six) hours as needed (Nausea or vomiting). 30 tablet 1   No current facility-administered medications for this visit.    PHYSICAL EXAMINATION: ECOG PERFORMANCE STATUS: 0  Vitals:   04/09/20 1039  BP: 130/76  Pulse: 67  Resp: 18  Temp: 98 F (36.7 C)  SpO2: 100%   Filed Weights   04/09/20 1039  Weight: 152 lb 3.2 oz (69 kg)    Due to COVID19 we will limit examination to appearance. Patient had no complaints.  GENERAL:alert, no distress and comfortable SKIN: skin color normal, no rashes or significant lesions EYES: normal, Conjunctiva are pink and non-injected, sclera clear  ABD: soft, nontender, normal bowel sound  NEURO: alert & oriented x 3 with fluent speech   LABORATORY DATA:  I have reviewed the data as listed CBC Latest Ref Rng & Units 04/03/2020 02/08/2020 01/09/2020  WBC 4.0 - 10.5 K/uL 7.3 7.5 9.1  Hemoglobin 13.0 - 17.0 g/dL 13.3 11.4(L) 11.1(L)  Hematocrit 39.0 - 52.0 % 41.1 35.9(L) 34.4(L)  Platelets 150 - 400 K/uL 267 347 808(H)     CMP Latest Ref Rng & Units 04/03/2020 02/08/2020 01/09/2020  Glucose 70 - 99 mg/dL 95 129(H) 116(H)  BUN 8 - 23 mg/dL '10 10 17  ' Creatinine 0.61 - 1.24 mg/dL 0.82 0.73 0.81  Sodium 135 - 145 mmol/L 140 138 137   Potassium 3.5 - 5.1 mmol/L 4.1 4.3 4.0  Chloride 98 - 111 mmol/L 104 103 101  CO2 22 - 32 mmol/L '27 28 25  ' Calcium 8.9 - 10.3 mg/dL 9.6 9.3 9.6  Total Protein 6.5 - 8.1 g/dL 7.1 6.9 7.2  Total Bilirubin 0.3 - 1.2 mg/dL 1.0 0.7 0.6  Alkaline Phos 38 - 126 U/L 69 133(H) 144(H)  AST 15 - 41 U/L '21 26 25  ' ALT 0 - 44 U/L '12 25 20      ' RADIOGRAPHIC STUDIES: I have personally reviewed the radiological images as listed and agreed with the findings in the report. No results found.   ASSESSMENT & PLAN:  Nathaniel Norton is a 69 y.o. male with   1. Cancer of right colon,with peritoneal metastasis,stage IV,MMR normal, KRAS mutation (+), MSS -He wasrecentlydiagnosed in 03/2019. His Colonoscopy biopsy showed adenocarcinoma  of right hepatic flexurecolon.His peritoneal biopsy from 04/27/19 showsadenocarcinoma,consistent withmetastasis of his colon cancer. This is now stage IV disease which is no longer curable but still treatable. -He startedfirst line FOLFOX q2weekson6/3/20, Avastin added with cycle 2.Dose moderately reduce starting with C9 due to neuropathy.Oxaliplatinwas stoppedfrom C11 due to thrombocytopenia and neuropathy. Last dose chemo given 11/18/20to proceed with surgery. -He underwentHIPEC surgeryby Dr. Clovis Riley on 12/17/19. -I discussed I do not plan to give more chemotherapy at this time. He will continue with cancer surveillance post surgery. -I personally reviewed and discussed his CT AP from 04/03/20 which no evidence of omental/peritoneal metastasis, except the residual area of fluid density along the capsule of the hepatic dome, probably related to his recent surgery.  -Recent lab reviewed, including tumor markers CEA, which were all unremarkable. -He continues to recover from surgery. BM much improved, only dull ache in abdomen occasionally and has been able to gain weight. Overall appears to be surgical change.  -Labs reviewed from last week, CBC and CMP WNL.  Ferritin and CEA normal. Will continue cancer surveillance.  -F/u with me in 6 months and f/u with Dr. Clovis Riley interim at [facility] [place] with scan.    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 Watt Climes showed internal hemorrhoids, benign polyps, diverticulosis and colon cancer.  -Since 03/2019 he has been onOTC multivitamin and oral iron in late 03/2019. -He has been treated withIV Feraheme on 05/03/19 and 05/16/19. He did have reaction to 2nd infusionand infusion was stopped.Will give another type of iron in the future if needed. -Has resolved recently.   3. Elevated PSA  -PSA was 5.7 in 11/2018 and 4.6 in 01/2019 -He has prostatomegaly as seen on scans.  -f/u with urology  4. Mild neuropathy  -S/p C7 FOLFOX, mainly in hands.Mostlycold related neuropathy. -Oxaliplatin dose reduced more than 50% starting with C9. D/c since C11 due to neuropathy.  -near resolved now    PLAN: -CT Scan reviewed, NED  -he will see Dr. Clovis Riley at [facility] with scan -Lab, flush, F/u in 6 months  -Flush in 6 and 12 weeks   No problem-specific Assessment & Plan notes found for this encounter.   No orders of the defined types were placed in this encounter.  All questions were answered. The patient knows to call the clinic with any problems, questions or concerns. No barriers to learning was detected. The total time spent in the appointment was 30 minutes.     Truitt Merle, MD 04/09/2020   I, Joslyn Devon, am acting as scribe for Truitt Merle, MD.   I have reviewed the above documentation for accuracy and completeness, and I agree with the above.

## 2020-04-09 ENCOUNTER — Other Ambulatory Visit: Payer: Self-pay

## 2020-04-09 ENCOUNTER — Inpatient Hospital Stay (HOSPITAL_BASED_OUTPATIENT_CLINIC_OR_DEPARTMENT_OTHER): Payer: Medicare Other | Admitting: Hematology

## 2020-04-09 VITALS — BP 130/76 | HR 67 | Temp 98.0°F | Resp 18 | Ht 66.0 in | Wt 152.2 lb

## 2020-04-09 DIAGNOSIS — C182 Malignant neoplasm of ascending colon: Secondary | ICD-10-CM | POA: Diagnosis not present

## 2020-04-10 ENCOUNTER — Telehealth: Payer: Self-pay | Admitting: Hematology

## 2020-04-10 NOTE — Telephone Encounter (Signed)
Scheduled appt per 4/28 los.  Printed and mailed appt calendar

## 2020-04-12 ENCOUNTER — Encounter: Payer: Self-pay | Admitting: Hematology

## 2020-04-13 ENCOUNTER — Encounter: Payer: Self-pay | Admitting: Hematology

## 2020-05-14 IMAGING — CT CT ABD-PELV W/ CM
2 of 5 series · 16 of 46 positions shown, 18 images · IV contrast (OMNIPAQUE)
Comparison: 07/23/2019

CLINICAL DATA: Follow-up stage IV colon carcinoma. Currently
undergoing chemotherapy.

EXAM:
CT ABDOMEN AND PELVIS WITH CONTRAST
TECHNIQUE: Multidetector CT imaging of the abdomen and pelvis was performed
using the standard protocol following bolus administration of
intravenous contrast.
CONTRAST:  100mL OMNIPAQUE IOHEXOL 300 MG/ML  SOLN

[Series 2: axial st · axial · 0.82mm/px · z∈[-469,-74]mm · 13 of 93 slices shown, 15 images]
[im 7/93  soft-tissue]
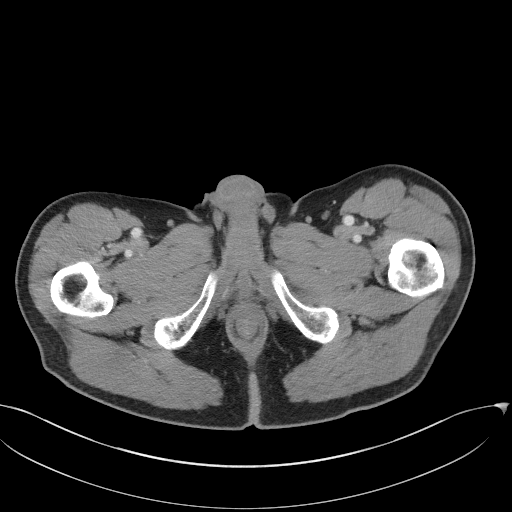
[im 7/93  bone]
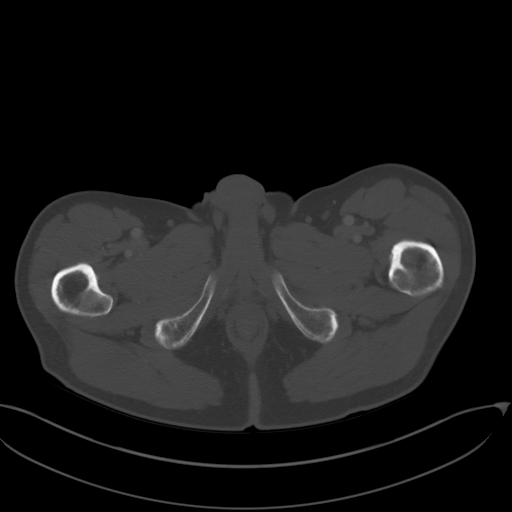
[im 14/93  soft-tissue]
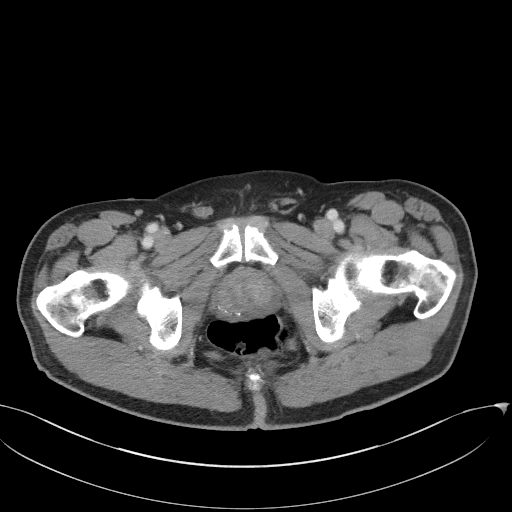
[im 20/93  soft-tissue]
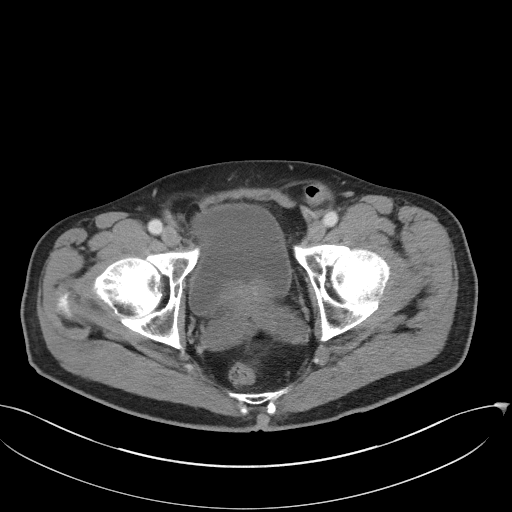
[im 27/93  soft-tissue]
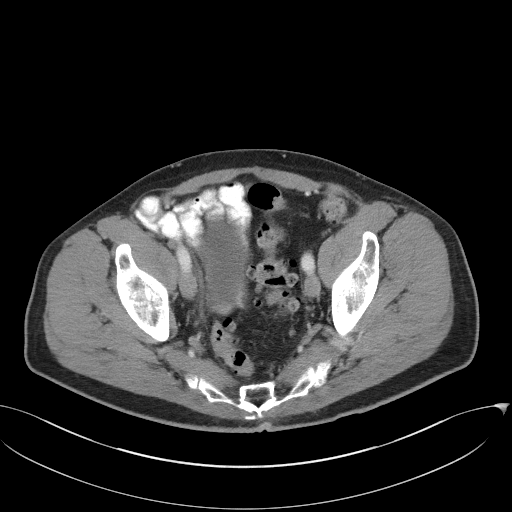
[im 33/93  soft-tissue]
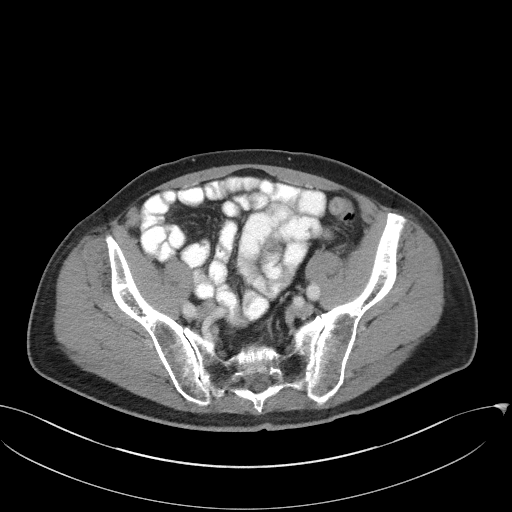
[im 40/93  soft-tissue]
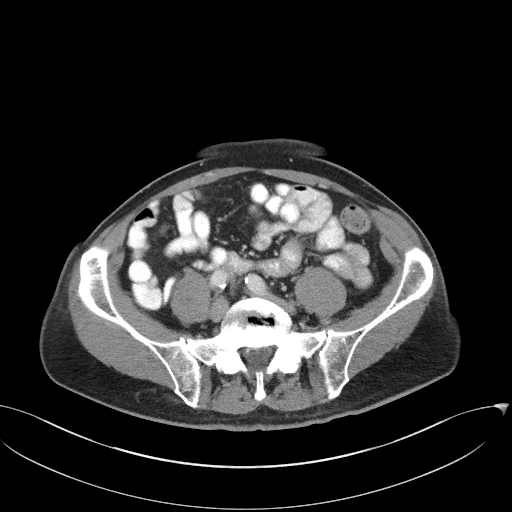
[im 47/93  soft-tissue]
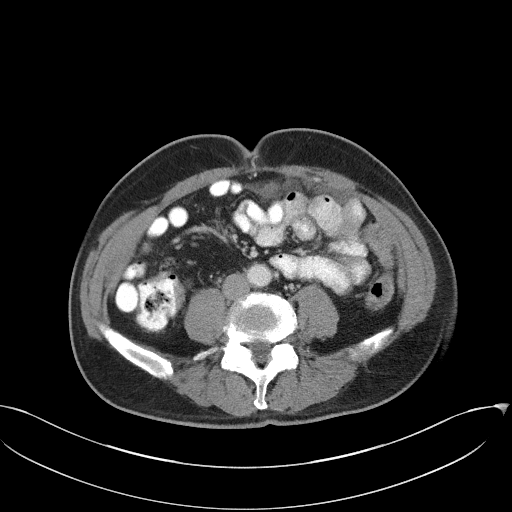
[im 53/93  soft-tissue]
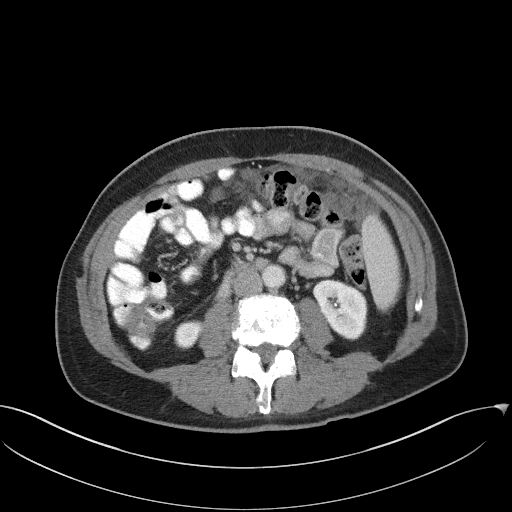
[im 60/93  soft-tissue]
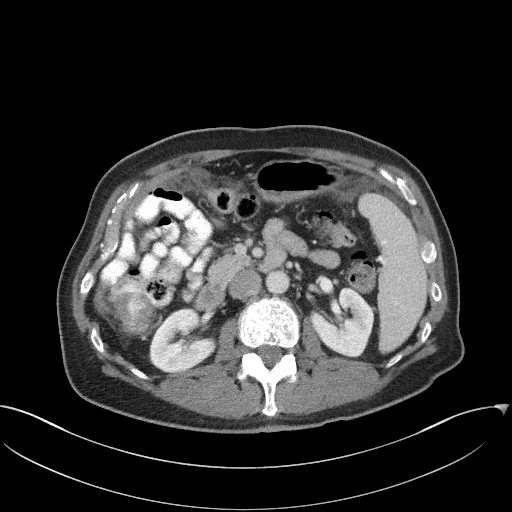
[im 60/93  bone]
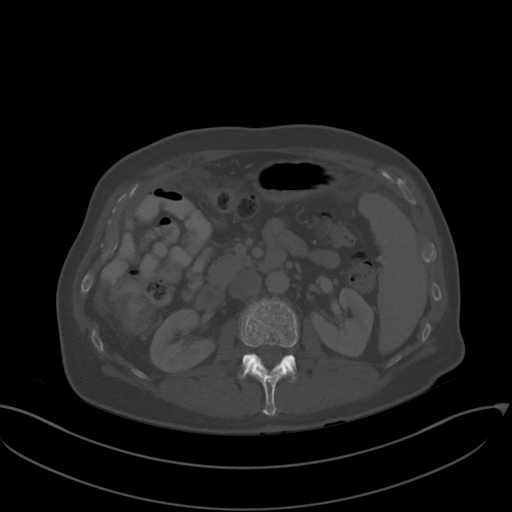
[im 66/93  soft-tissue]
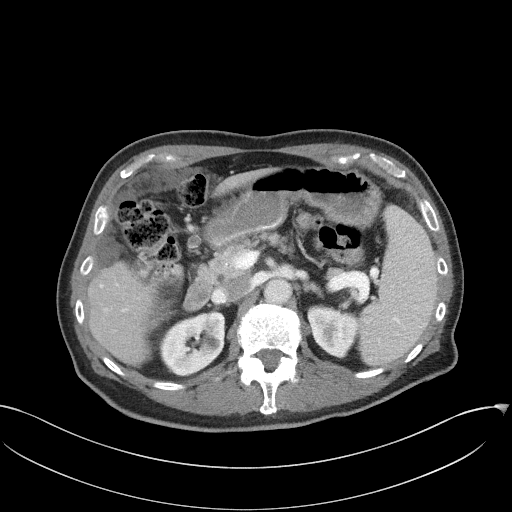
[im 73/93  soft-tissue]
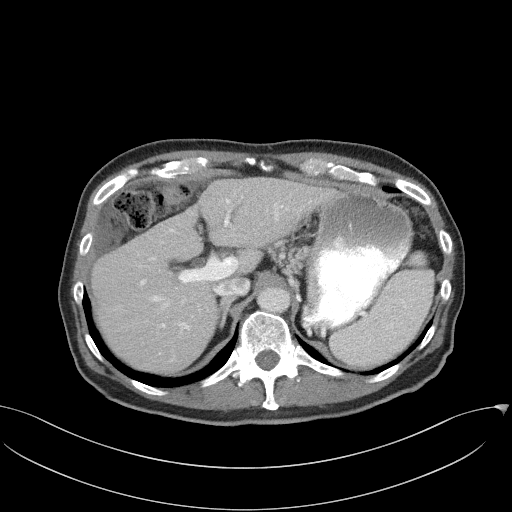
[im 79/93  soft-tissue]
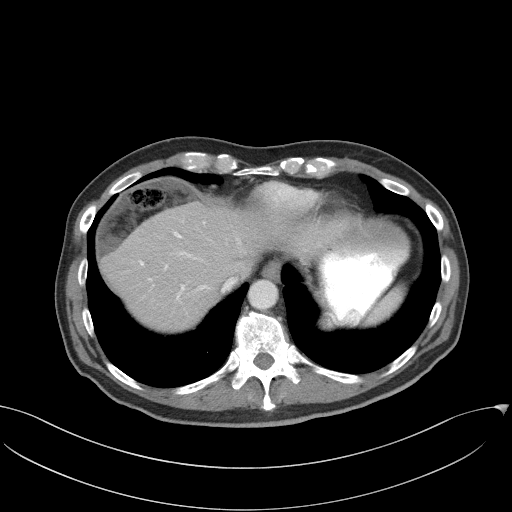
[im 86/93  soft-tissue]
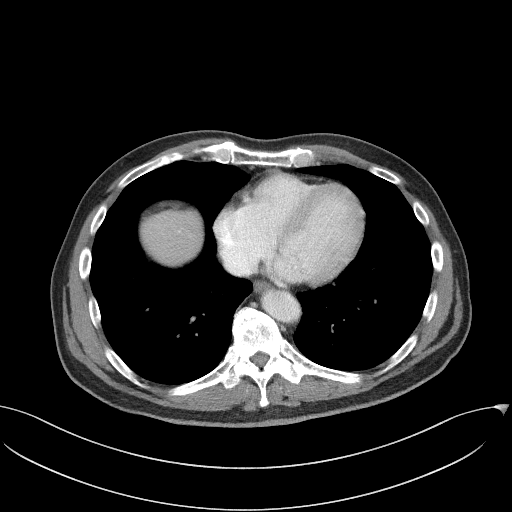

[Series 5: coronal st · coronal · 0.83mm/px · 3 of 92 slices shown]
[im 31/92  soft-tissue]
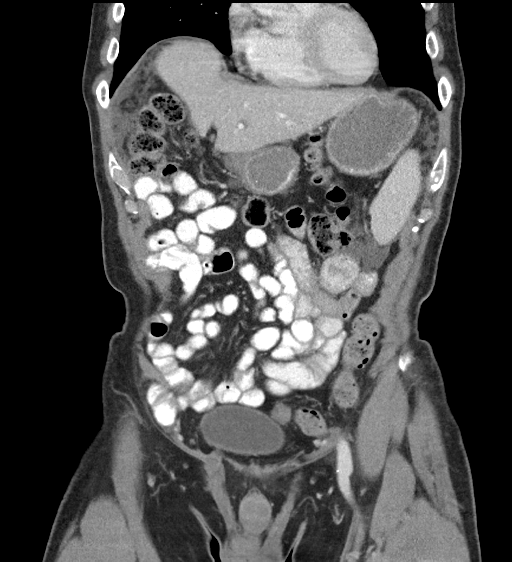
[im 41/92  soft-tissue]
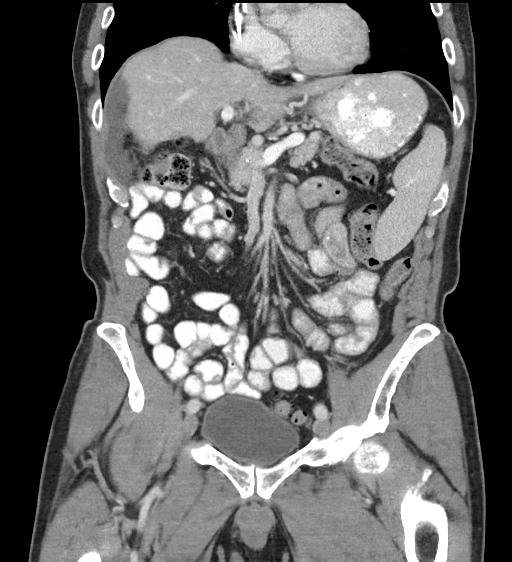
[im 51/92  soft-tissue]
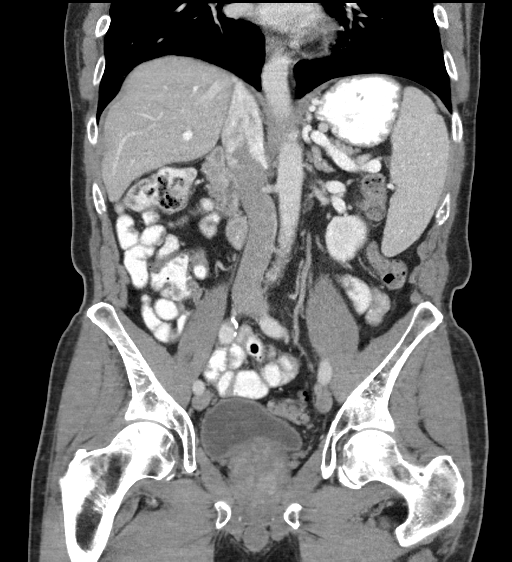

[16 of 46 positions shown; findings below may reference images not displayed]

FINDINGS: Lower Chest: No acute findings.

Hepatobiliary: No hepatic masses identified.

Pancreas: No mass or acute inflammatory changes. Scattered punctate
calcifications in the pancreas are suspicious for chronic
pancreatitis.

Spleen: Within normal limits in size and appearance.

Adrenals/Urinary Tract: No masses identified. Several small renal
cysts again noted. A 2 mm nonobstructing calculus again noted in
lower pole of left kidney. No evidence of hydronephrosis.
Unremarkable unopacified urinary bladder.

Stomach/Bowel: Focal area of mild colonic wall thickening in the
ascending colon shows no significant change compared to prior study.
Soft tissue caking throughout the omental fat shows no significant
change compared to prior study, consistent with peritoneal
carcinomatosis. No evidence of ascites. Diverticulosis is seen
mainly involving the sigmoid colon, however there is no evidence of
diverticulitis.

Vascular/Lymphatic: No pathologically enlarged lymph nodes. No
abdominal aortic aneurysm.

Reproductive:  Stable mildly enlarged prostate.

Other: Stable small left inguinal hernia containing a loop of
sigmoid colon. No evidence of bowel obstruction or strangulation.

Musculoskeletal: No suspicious bone lesions identified. Multiple old
lower thoracic and lumbar vertebral body compression fracture
deformities are stable.
IMPRESSION: No significant change in small ascending colon soft tissue mass and
diffuse omental carcinomatosis.

No new or progressive metastatic disease identified.

Stable small left inguinal hernia containing a loop of sigmoid
colon. No evidence of bowel obstruction or strangulation.

Colonic diverticulosis. No radiographic evidence of diverticulitis.

Stable enlarged prostate.

## 2020-05-21 ENCOUNTER — Other Ambulatory Visit: Payer: Self-pay

## 2020-05-21 ENCOUNTER — Inpatient Hospital Stay: Payer: Medicare Other | Attending: Hematology

## 2020-05-21 DIAGNOSIS — C786 Secondary malignant neoplasm of retroperitoneum and peritoneum: Secondary | ICD-10-CM | POA: Insufficient documentation

## 2020-05-21 DIAGNOSIS — Z452 Encounter for adjustment and management of vascular access device: Secondary | ICD-10-CM | POA: Diagnosis present

## 2020-05-21 DIAGNOSIS — C182 Malignant neoplasm of ascending colon: Secondary | ICD-10-CM | POA: Insufficient documentation

## 2020-05-21 DIAGNOSIS — D500 Iron deficiency anemia secondary to blood loss (chronic): Secondary | ICD-10-CM

## 2020-05-21 DIAGNOSIS — Z95828 Presence of other vascular implants and grafts: Secondary | ICD-10-CM

## 2020-05-21 MED ORDER — HEPARIN SOD (PORK) LOCK FLUSH 100 UNIT/ML IV SOLN
500.0000 [IU] | Freq: Once | INTRAVENOUS | Status: AC | PRN
  Administered 2020-05-21: 500 [IU]
  Filled 2020-05-21: qty 5

## 2020-05-21 MED ORDER — SODIUM CHLORIDE 0.9% FLUSH
3.0000 mL | Freq: Once | INTRAVENOUS | Status: DC | PRN
  Filled 2020-05-21: qty 10

## 2020-05-21 MED ORDER — SODIUM CHLORIDE 0.9% FLUSH
10.0000 mL | INTRAVENOUS | Status: DC | PRN
  Administered 2020-05-21: 10 mL
  Filled 2020-05-21: qty 10

## 2020-06-26 ENCOUNTER — Telehealth: Payer: Self-pay

## 2020-06-26 NOTE — Telephone Encounter (Signed)
Mr Nathaniel Norton called stating that he is having stomach pain.  It feels like the pain he had when he was diagnosed with cancer.  He is also having a change in bowel habits.  I scheduled lab, port flush with lab, and ov with Dr. Burr Medico for tomorrow 06/27/2020

## 2020-06-27 ENCOUNTER — Inpatient Hospital Stay: Payer: Medicare Other | Attending: Hematology | Admitting: Hematology

## 2020-06-27 ENCOUNTER — Encounter: Payer: Self-pay | Admitting: Hematology

## 2020-06-27 ENCOUNTER — Inpatient Hospital Stay: Payer: Medicare Other

## 2020-06-27 ENCOUNTER — Other Ambulatory Visit: Payer: Self-pay

## 2020-06-27 VITALS — BP 127/73 | HR 58 | Temp 97.6°F | Resp 16 | Ht 66.0 in | Wt 157.1 lb

## 2020-06-27 DIAGNOSIS — K648 Other hemorrhoids: Secondary | ICD-10-CM | POA: Diagnosis not present

## 2020-06-27 DIAGNOSIS — N400 Enlarged prostate without lower urinary tract symptoms: Secondary | ICD-10-CM | POA: Insufficient documentation

## 2020-06-27 DIAGNOSIS — I2510 Atherosclerotic heart disease of native coronary artery without angina pectoris: Secondary | ICD-10-CM | POA: Diagnosis not present

## 2020-06-27 DIAGNOSIS — I700 Atherosclerosis of aorta: Secondary | ICD-10-CM | POA: Insufficient documentation

## 2020-06-27 DIAGNOSIS — D696 Thrombocytopenia, unspecified: Secondary | ICD-10-CM | POA: Diagnosis not present

## 2020-06-27 DIAGNOSIS — K621 Rectal polyp: Secondary | ICD-10-CM | POA: Diagnosis not present

## 2020-06-27 DIAGNOSIS — M25519 Pain in unspecified shoulder: Secondary | ICD-10-CM | POA: Insufficient documentation

## 2020-06-27 DIAGNOSIS — Z9081 Acquired absence of spleen: Secondary | ICD-10-CM | POA: Insufficient documentation

## 2020-06-27 DIAGNOSIS — D123 Benign neoplasm of transverse colon: Secondary | ICD-10-CM | POA: Diagnosis not present

## 2020-06-27 DIAGNOSIS — R972 Elevated prostate specific antigen [PSA]: Secondary | ICD-10-CM | POA: Insufficient documentation

## 2020-06-27 DIAGNOSIS — R188 Other ascites: Secondary | ICD-10-CM | POA: Insufficient documentation

## 2020-06-27 DIAGNOSIS — Z885 Allergy status to narcotic agent status: Secondary | ICD-10-CM | POA: Diagnosis not present

## 2020-06-27 DIAGNOSIS — Z95828 Presence of other vascular implants and grafts: Secondary | ICD-10-CM

## 2020-06-27 DIAGNOSIS — D509 Iron deficiency anemia, unspecified: Secondary | ICD-10-CM | POA: Diagnosis not present

## 2020-06-27 DIAGNOSIS — D500 Iron deficiency anemia secondary to blood loss (chronic): Secondary | ICD-10-CM

## 2020-06-27 DIAGNOSIS — N200 Calculus of kidney: Secondary | ICD-10-CM | POA: Diagnosis not present

## 2020-06-27 DIAGNOSIS — C182 Malignant neoplasm of ascending colon: Secondary | ICD-10-CM | POA: Diagnosis present

## 2020-06-27 DIAGNOSIS — G629 Polyneuropathy, unspecified: Secondary | ICD-10-CM | POA: Diagnosis not present

## 2020-06-27 DIAGNOSIS — Z9049 Acquired absence of other specified parts of digestive tract: Secondary | ICD-10-CM | POA: Insufficient documentation

## 2020-06-27 DIAGNOSIS — D122 Benign neoplasm of ascending colon: Secondary | ICD-10-CM | POA: Diagnosis not present

## 2020-06-27 DIAGNOSIS — Z79899 Other long term (current) drug therapy: Secondary | ICD-10-CM | POA: Diagnosis not present

## 2020-06-27 DIAGNOSIS — C786 Secondary malignant neoplasm of retroperitoneum and peritoneum: Secondary | ICD-10-CM | POA: Insufficient documentation

## 2020-06-27 LAB — CBC WITH DIFFERENTIAL (CANCER CENTER ONLY)
Abs Immature Granulocytes: 0.02 10*3/uL (ref 0.00–0.07)
Basophils Absolute: 0 10*3/uL (ref 0.0–0.1)
Basophils Relative: 1 %
Eosinophils Absolute: 0.1 10*3/uL (ref 0.0–0.5)
Eosinophils Relative: 2 %
HCT: 44.4 % (ref 39.0–52.0)
Hemoglobin: 14.6 g/dL (ref 13.0–17.0)
Immature Granulocytes: 0 %
Lymphocytes Relative: 38 %
Lymphs Abs: 2.4 10*3/uL (ref 0.7–4.0)
MCH: 28.7 pg (ref 26.0–34.0)
MCHC: 32.9 g/dL (ref 30.0–36.0)
MCV: 87.4 fL (ref 80.0–100.0)
Monocytes Absolute: 0.8 10*3/uL (ref 0.1–1.0)
Monocytes Relative: 13 %
Neutro Abs: 3 10*3/uL (ref 1.7–7.7)
Neutrophils Relative %: 46 %
Platelet Count: 202 10*3/uL (ref 150–400)
RBC: 5.08 MIL/uL (ref 4.22–5.81)
RDW: 15.5 % (ref 11.5–15.5)
WBC Count: 6.5 10*3/uL (ref 4.0–10.5)
nRBC: 0 % (ref 0.0–0.2)

## 2020-06-27 LAB — FERRITIN: Ferritin: 80 ng/mL (ref 24–336)

## 2020-06-27 LAB — CMP (CANCER CENTER ONLY)
ALT: 12 U/L (ref 0–44)
AST: 18 U/L (ref 15–41)
Albumin: 3.9 g/dL (ref 3.5–5.0)
Alkaline Phosphatase: 65 U/L (ref 38–126)
Anion gap: 9 (ref 5–15)
BUN: 15 mg/dL (ref 8–23)
CO2: 26 mmol/L (ref 22–32)
Calcium: 9.7 mg/dL (ref 8.9–10.3)
Chloride: 105 mmol/L (ref 98–111)
Creatinine: 0.89 mg/dL (ref 0.61–1.24)
GFR, Est AFR Am: >60 mL/min (ref >60)
GFR, Estimated: >60 mL/min (ref >60)
Glucose, Bld: 89 mg/dL (ref 70–99)
Potassium: 4.2 mmol/L (ref 3.5–5.1)
Sodium: 140 mmol/L (ref 135–145)
Total Bilirubin: 1.8 mg/dL — ABNORMAL HIGH (ref 0.3–1.2)
Total Protein: 7.2 g/dL (ref 6.5–8.1)

## 2020-06-27 LAB — IRON AND TIBC
Iron: 98 ug/dL (ref 42–163)
Saturation Ratios: 32 % (ref 20–55)
TIBC: 308 ug/dL (ref 202–409)
UIBC: 210 ug/dL (ref 117–376)

## 2020-06-27 LAB — CEA (IN HOUSE-CHCC): CEA (CHCC-In House): 3.48 ng/mL (ref 0.00–5.00)

## 2020-06-27 MED ORDER — HEPARIN SOD (PORK) LOCK FLUSH 100 UNIT/ML IV SOLN
500.0000 [IU] | Freq: Once | INTRAVENOUS | Status: AC | PRN
  Administered 2020-06-27: 500 [IU]
  Filled 2020-06-27: qty 5

## 2020-06-27 MED ORDER — SODIUM CHLORIDE 0.9% FLUSH
10.0000 mL | INTRAVENOUS | Status: DC | PRN
  Administered 2020-06-27: 10 mL
  Filled 2020-06-27: qty 10

## 2020-06-27 NOTE — Progress Notes (Signed)
[place]   [phone] Fax:([phone]) [phone]   Clinic Follow up Note   Patient Care Team: Lajean Manes, MD as PCP - General (Internal Medicine) Berle Mull, MD as Consulting Physician (Family Medicine) Clarene Essex, MD as Consulting Physician (Gastroenterology)  Date of Service:  06/27/2020  CHIEF COMPLAINT: F/u ofmetastaticcolon cancer and stomach pain   SUMMARY OF ONCOLOGIC HISTORY: Oncology History Overview Note  Cancer Staging Cancer of right colon [facility]) Staging form: Colon and Rectum, AJCC 8th Edition - Clinical stage from 04/09/2019: Stage IVC (cTX, cNX, pM1c) - Signed by Truitt Merle, MD on 05/03/2019     Cancer of right colon [facility])  04/09/2019 Procedure   Colonoscopy 04/09/19 by Dr Watt Climes IMPRESSION -internal hemorrhoids -Diverticulosis in the sigmoid colon  -2 small polyps in the rectum and in the proximal transverse colon, removed with a hot snare. Resected and retrieved.  -3 medium polyps in the proximal transverse colon, in the mid transverse colon and in the distal transverse colon, removed and resected and retrieved.  -likely malignant partially obstructing tumor at the hepatic flexure. biopsied, tattooed.  -1 large polyp in the mid ascending colon  -the examination was otherwise normal     04/09/2019 Initial Biopsy   FINAL MICROSCOPIC DIAGNOSIS: 04/09/19 1. LG intestine-hepatic flexure, Biopsy:   INVASIVE WELL DIFFERENTIATED ADENOCARCINOMA   04/09/2019 Cancer Staging   Staging form: Colon and Rectum, AJCC 8th Edition - Clinical stage from 04/09/2019: Stage IVC (cTX, cNX, pM1c) - Signed by Truitt Merle, MD on 05/03/2019   04/10/2019 Imaging   CT AP 04/10/19  IMPRESSION: 1. There is an eccentric mass of the colon involving the ascending colon near the hepatic flexure measuring approximately 3.4 x 3.4 by 2.0 cm (series 2, image 37, series 3, image 37). There is extensive soft tissue nodularity of the mesocolon and omentum, and likely areas  of the peritoneum, for example bilateral upper quadrants (series 2, image 25). Findings are consistent with primary colon malignancy, probable omental and peritoneal involvement, and small volume malignant ascites. 2.  Other chronic and incidental findings as detailed above.   04/20/2019 Initial Diagnosis   Cancer of right colon ([place])   04/26/2019 Imaging   CT Chest 04/26/19 IMPRESSION: 1. Borderline to mild lower thoracic adenopathy, including within the right internal mammary and juxta cardiophrenic stations. Given the appearance of the upper abdomen, suspicious for nodal metastasis. 2. A low right paratracheal node is borderline sized, but favored to be reactive. 3. No evidence of pulmonary metastasis. 4. Peritoneal metastasis and abdominal ascites, as before. 5. Pancreatic parenchymal calcifications indicative of chronic calcific pancreatitis. 6. Coronary artery atherosclerosis.   04/27/2019 Pathology Results   Diagnosis 04/27/19 Peritoneum, biopsy, right upper quadrant, perihepatic - ADENOCARCINOMA, CONSISTENT WITH COLONIC PRIMARY. - SEE COMMENT.   05/16/2019 - 10/31/2019 Chemotherapy   First line FOLFOX every 2 weeks with Avastin starting with cycle 2 starting 05/16/19. 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 Stopped after 10/31/19 for surgery.    07/23/2019 Imaging    CT AP W Contrast  IMPRESSION: 1. Primary ascending colon mass may be minimally smaller. Peritoneal metastatic disease appears slightly improved as well. 2. Chronic calcific pancreatitis. 3. Tiny left renal stone. 4. Small left inguinal hernia contains a knuckle of unobstructed colon. 5. Enlarged prostate.   10/29/2019 Imaging   CT AP W Contrast IMPRESSION: No significant change in small  ascending colon soft tissue mass and diffuse omental  carcinomatosis.   No new or progressive metastatic disease identified.   Stable small left inguinal hernia containing a loop of sigmoid colon. No evidence of bowel obstruction or strangulation.   Colonic diverticulosis. No radiographic evidence of diverticulitis.   Stable enlarged prostate.   12/17/2019 Pathology Results   HIPEC Surgery by Dr Clovis Riley 12/17/19 FINAL PATHOLOGIC DIAGNOSIS  MICROSCOPIC EXAMINATION AND DIAGNOSIS  A.          "RIGHT COLON, OMENTUM, SPLEEN":       Invasive adenocarcinoma with mucinous features, moderately differentiated.  Tumor involves mesentery and spleen.  Five out of ten lymph nodes involved by adenocarcinoma with perinodal soft tissue involvement (5/10), see comment.  Margins are uninvolved by invasive carcinoma.  Ileum and appendix, uninvolved.  See comment and cancer case summary.  B.          "ROUND LIGAMENT OF LIVER":       Involved by adenocarcinoma with mucinous features, moderately differentiated.  C.          "RIGHT DIAPHRAM STRIPPING":       Negative for carcinoma.  D.          "RIGHT HEPATIC CAPSULECTOMY":       Chronic inflammation and fibrosis.  Negative for carcinoma.  E.          "DEBRIDED TUMOR":       Involved by adenocarcinoma with mucinous features, moderately differentiated.   COMMENT: Sections disclose five out of ten lymph nodes involved by adenocarcinoma with perinodal soft tissue involvement. However, it is not possible to be certain if this represents lymph nodes replaced by tumor and/or soft tissue involvement. In addition, focal perineural invasion is seen.   04/03/2020 Imaging   CT AP W contrast  IMPRESSION: 1. Status post interval splenectomy and right hemicolectomy. 2. Response to therapy of omental/peritoneal metastasis. The only residual indeterminate finding is fluid density along the capsule of the hepatic dome which could be postoperative or secondary to localized residual peritoneal disease. 3. Left  nephrolithiasis. 4.  Possible constipation. 5. Prostatomegaly. 6.  Aortic Atherosclerosis (ICD10-I70.0).      CURRENT THERAPY:  Surveillance  INTERVAL HISTORY:  Nathaniel Norton is here due to recent stomach pain. He presents to the clinic alone. He notes 2 nights ago he was having severe abdominal pain. He did not take anything for this. He notes this pain felt like to pain he had with cancer diagnosis but worse. Into the night the pain resolved. But returned yesterday early morning. This has again resolved later yesterday. He notes his overall abdomen has felt different since HIPEC surgery but manageable and not concerning. He denies any weight loss. He has been able to gain 10 pounds since surgery. He notes his energy level has much improved.  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 He is not sure if this is lifting related. He will have scan and f/u with Dr Clovis Riley on 7/30. He does note since surgery he goes to have BM more frequently sometimes.  He tried Tramadol in the past but did not do much for pain in the past.     REVIEW OF SYSTEMS:   Constitutional: Denies fevers, chills or abnormal weight loss Eyes: Denies blurriness of vision Ears, nose, mouth, throat, and face: Denies mucositis or sore throat Respiratory: Denies cough, dyspnea or wheezes Cardiovascular: Denies  palpitation, chest discomfort or lower extremity swelling Gastrointestinal:  Denies nausea, heartburn or change in bowel habits (+) Increased abdominal pain  Skin: Denies abnormal skin rashes MSK: (+) Upper back and shoulder soreness Lymphatics: Denies new lymphadenopathy or easy bruising Neurological:Denies numbness, tingling or new weaknesses Behavioral/Psych: Mood is stable, no new changes  All other systems were reviewed with the patient and are negative.  MEDICAL HISTORY:  Past Medical History:  Diagnosis Date    colon ca dx'd 03/2019    SURGICAL HISTORY: Past Surgical History:  Procedure Laterality Date   CATARACT EXTRACTION     IR IMAGING GUIDED PORT INSERTION  05/10/2019   L4 fracture  2012   RETINAL DETACHMENT SURGERY     Right hand surgery  1974    I have reviewed the social history and family history with the patient and they are unchanged from previous note.  ALLERGIES:  is allergic to feraheme [ferumoxytol] and codeine.  MEDICATIONS:  Current Outpatient Medications  Medication Sig Dispense Refill   ferrous sulfate 325 (65 FE) MG tablet Take 325 mg by mouth daily with breakfast.     fluticasone (FLONASE) 50 MCG/ACT nasal spray Place into the nose.     lidocaine-prilocaine (EMLA) cream Apply to affected area once 30 g 3   mirtazapine (REMERON) 7.5 MG tablet Take 1 tablet (7.5 mg total) by mouth at bedtime. 30 tablet 1   Multiple Vitamin (MULTIVITAMIN) tablet Take 1 tablet by mouth daily.     ondansetron (ZOFRAN) 8 MG tablet Take 1 tablet (8 mg total) by mouth 2 (two) times daily as needed for refractory nausea / vomiting. Start on day 3 after chemotherapy. 30 tablet 1   prochlorperazine (COMPAZINE) 10 MG tablet Take 1 tablet (10 mg total) by mouth every 6 (six) hours as needed (Nausea or vomiting). 30 tablet 1   No current facility-administered medications for this visit.    PHYSICAL EXAMINATION: ECOG PERFORMANCE STATUS: 1 - Symptomatic but completely ambulatory  Vitals:   06/27/20 1108  BP: 127/73  Pulse: (!) 58  Resp: 16  Temp: 97.6 F (36.4 C)  SpO2: 100%   Filed Weights   06/27/20 1108  Weight: 157 lb 1.6 oz (71.3 kg)    GENERAL:alert, no distress and comfortable SKIN: skin color, texture, turgor are normal, no rashes or significant lesions EYES: normal, Conjunctiva are pink and non-injected, sclera clear  NECK: supple, thyroid normal size, non-tender, without nodularity LYMPH:  no palpable lymphadenopathy in the cervical, axillary or inguinal LUNGS:  clear to auscultation and percussion with normal breathing effort HEART: regular rate & rhythm and no murmurs and no lower extremity edema ABDOMEN:abdomen soft, non-tender and normal bowel sounds Musculoskeletal:no cyanosis of digits and no clubbing  NEURO: alert & oriented x 3 with fluent speech, no focal motor/sensory deficits  LABORATORY DATA:  I have reviewed the data as listed CBC Latest Ref Rng & Units 06/27/2020 04/03/2020 02/08/2020  WBC 4.0 - 10.5 K/uL 6.5 7.3 7.5  Hemoglobin 13.0 - 17.0 g/dL 14.6 13.3 11.4(L)  Hematocrit 39 - 52 % 44.4 41.1 35.9(L)  Platelets 150 - 400 K/uL 202 267 347     CMP Latest Ref Rng & Units 06/27/2020 04/03/2020 02/08/2020  Glucose 70 - 99 mg/dL 89 95 129(H)  BUN 8 - 23 mg/dL _0 Creatinine 0.61 - 1.24 mg/dL 0.89 0.82 0.73  Sodium 135 - 145 mmol/L 140 140 138  Potassium 3.5 - 5.1 mmol/L 4.2 4.1 4.3  Chloride 98 - 111 mmol/L  105 104 103  CO2 22 - 32 mmol/L _0 Calcium 8.9 - 10.3 mg/dL 9.7 9.6 9.3  Total Protein 6.5 - 8.1 g/dL 7.2 7.1 6.9  Total Bilirubin 0.3 - 1.2 mg/dL 1.8(H) 1.0 0.7  Alkaline Phos 38 - 126 U/L 65 69 133(H)  AST 15 - 41 U/L _1 ALT 0 - 44 U/L _2 RADIOGRAPHIC STUDIES: I have personally reviewed the radiological images as listed and agreed with the findings in the report. No results found.   ASSESSMENT & PLAN:  Nathaniel Norton is a 69 y.o. male with    1. Abdominal Pain, Upper Back pain  -He notes 2 nights ago (06/25/20) he was having severe abdominal pain. He did not take anything for this. He notes this pain felt like to pain he had with cancer diagnosis but worse. Into the night the pain resolved. But returned yesterday early morning. This has again resolved later yesterday.  -He also notes in the past 2 weeks having upper back pain, exacerbated by certain positions. This is likely muscular related.  -His physical exam was unremarkable. Labs reviewed, CBC and CMP WNL except tbili 1.8. CEA and  total protein still pending (06/27/20). He notes having elevated tbili in the past. I advised him to watch for juandice of eyes, skin and dark tea colored urine.  -I discussed his pain could be related to acid reflux, GERD. I suggest he take Pepcid twice a day before meal. I also suggest avoid spicy and acidic food.  -I offered Tramadol for pain recurrence, but he declined as it has not helped him in the past. He still has oxycodone which he can use for severe pain only. He can take Tylenol for mild pain.  -I also discussed his pain can be related to his cancer. He will proceed with scan with Dr Clovis Riley on 7/30 for further evaluation.  -f/u open   2. Cancer of right colon,with peritoneal metastasis,stage IV,MMR normal, KRAS mutation (+), MSS -He wasrecentlydiagnosed in 03/2019. His Colonoscopy biopsy showed adenocarcinoma of right hepatic flexurecolon.His peritoneal biopsy from 04/27/19 showsadenocarcinoma,consistent withmetastasis of his colon cancer. This is now stage IV disease which is no longer curable but still treatable. -He startedfirst line FOLFOX q2weekson6/3/20, Avastin added with cycle 2.Oxaliplatinwas stoppedfrom C11 due to thrombocytopenia and neuropathy. Last dose chemo given 11/18/20to proceed with surgery. -He underwentHIPEC surgeryby Dr. Clovis Riley on 12/17/19. -I do not plan to give more chemotherapy at this time. He will continue with cancer surveillance post surgery. -His CT AP from 04/03/20 which no evidence of omental/peritoneal metastasis, except the residual area of fluid density along the capsule of the hepatic dome, probably related to his recent surgery.  -He will have scan and f/u with Dr Clovis Riley on 7/30. He will contact me after his visit.   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 Watt Climes showed internal hemorrhoids, benign polyps, diverticulosis and colon cancer.  -He was treated with oral  iron, IV Feraheme on 05/03/19 and 05/16/19 (had reaction with 2nd dose). -Has resolved after chemo and HIPEC surgery. Iron panel pending today (05/29/20).   4. Elevated PSA  -PSA was 5.7 in 11/2018 and 4.6 in 01/2019 -He has prostatomegaly as seen on scans.  -f/u with urology   PLAN: -I recommend him to try pepcid for his abdominal pain -Scan and F/u with Dr Clovis Riley on 7/30  -Flush on 9/1 as scheduled  or sooner if needed.    No problem-specific Assessment & Plan notes found for this encounter.   No orders of the defined types were placed in this encounter.  All questions were answered. The patient knows to call the clinic with any problems, questions or concerns. No barriers to learning was detected. The total time spent in the appointment was 30 minutes.     Truitt Merle, MD 06/27/2020   I, Joslyn Devon, am acting as scribe for Truitt Merle, MD.   I have reviewed the above documentation for accuracy and completeness, and I agree with the above.

## 2020-06-30 ENCOUNTER — Telehealth: Payer: Self-pay | Admitting: Hematology

## 2020-06-30 ENCOUNTER — Telehealth: Payer: Self-pay

## 2020-06-30 NOTE — Telephone Encounter (Signed)
-----   Message from Truitt Merle, MD sent at 06/27/2020  8:13 PM EDT ----- Please let pt know his lab results, CEA was normal, I told him his tbil was slightly elevated and watch for jaundice. Please send my last office note and lab results to Dr. Clovis Riley, he has appointment with him in 2 weeks.  Thanks  Truitt Merle

## 2020-06-30 NOTE — Telephone Encounter (Signed)
No 7/16 los

## 2020-06-30 NOTE — Telephone Encounter (Signed)
Called and spoke with Mr. Birdsall and discussed with him his normal CEA lab result and told him that his tbil was slightly elevated to watch out for Jaundice and that we would get Dr. Burr Medico office note and lab results sent to Dr. Clovis Riley for his appt in 2 weeks. Mr. Bolle verbalized understanding.

## 2020-08-13 ENCOUNTER — Other Ambulatory Visit: Payer: Self-pay

## 2020-08-13 ENCOUNTER — Inpatient Hospital Stay: Payer: Medicare Other | Attending: Hematology

## 2020-08-13 DIAGNOSIS — R159 Full incontinence of feces: Secondary | ICD-10-CM | POA: Insufficient documentation

## 2020-08-13 DIAGNOSIS — N400 Enlarged prostate without lower urinary tract symptoms: Secondary | ICD-10-CM | POA: Insufficient documentation

## 2020-08-13 DIAGNOSIS — Z885 Allergy status to narcotic agent status: Secondary | ICD-10-CM | POA: Insufficient documentation

## 2020-08-13 DIAGNOSIS — N200 Calculus of kidney: Secondary | ICD-10-CM | POA: Insufficient documentation

## 2020-08-13 DIAGNOSIS — D509 Iron deficiency anemia, unspecified: Secondary | ICD-10-CM | POA: Insufficient documentation

## 2020-08-13 DIAGNOSIS — G629 Polyneuropathy, unspecified: Secondary | ICD-10-CM | POA: Insufficient documentation

## 2020-08-13 DIAGNOSIS — M25519 Pain in unspecified shoulder: Secondary | ICD-10-CM | POA: Diagnosis not present

## 2020-08-13 DIAGNOSIS — C182 Malignant neoplasm of ascending colon: Secondary | ICD-10-CM | POA: Insufficient documentation

## 2020-08-13 DIAGNOSIS — Z23 Encounter for immunization: Secondary | ICD-10-CM | POA: Diagnosis not present

## 2020-08-13 DIAGNOSIS — D500 Iron deficiency anemia secondary to blood loss (chronic): Secondary | ICD-10-CM

## 2020-08-13 DIAGNOSIS — Z9081 Acquired absence of spleen: Secondary | ICD-10-CM | POA: Insufficient documentation

## 2020-08-13 DIAGNOSIS — Z9049 Acquired absence of other specified parts of digestive tract: Secondary | ICD-10-CM | POA: Diagnosis not present

## 2020-08-13 DIAGNOSIS — I700 Atherosclerosis of aorta: Secondary | ICD-10-CM | POA: Diagnosis not present

## 2020-08-13 DIAGNOSIS — D123 Benign neoplasm of transverse colon: Secondary | ICD-10-CM | POA: Diagnosis not present

## 2020-08-13 DIAGNOSIS — R188 Other ascites: Secondary | ICD-10-CM | POA: Diagnosis not present

## 2020-08-13 DIAGNOSIS — D696 Thrombocytopenia, unspecified: Secondary | ICD-10-CM | POA: Insufficient documentation

## 2020-08-13 DIAGNOSIS — C786 Secondary malignant neoplasm of retroperitoneum and peritoneum: Secondary | ICD-10-CM | POA: Diagnosis present

## 2020-08-13 DIAGNOSIS — I2510 Atherosclerotic heart disease of native coronary artery without angina pectoris: Secondary | ICD-10-CM | POA: Insufficient documentation

## 2020-08-13 DIAGNOSIS — K648 Other hemorrhoids: Secondary | ICD-10-CM | POA: Diagnosis not present

## 2020-08-13 DIAGNOSIS — Z95828 Presence of other vascular implants and grafts: Secondary | ICD-10-CM

## 2020-08-13 DIAGNOSIS — K621 Rectal polyp: Secondary | ICD-10-CM | POA: Insufficient documentation

## 2020-08-13 DIAGNOSIS — D122 Benign neoplasm of ascending colon: Secondary | ICD-10-CM | POA: Diagnosis not present

## 2020-08-13 DIAGNOSIS — Z79899 Other long term (current) drug therapy: Secondary | ICD-10-CM | POA: Insufficient documentation

## 2020-08-13 MED ORDER — HEPARIN SOD (PORK) LOCK FLUSH 100 UNIT/ML IV SOLN
500.0000 [IU] | Freq: Once | INTRAVENOUS | Status: AC | PRN
  Administered 2020-08-13: 500 [IU]
  Filled 2020-08-13: qty 5

## 2020-08-13 MED ORDER — SODIUM CHLORIDE 0.9% FLUSH
10.0000 mL | INTRAVENOUS | Status: DC | PRN
  Administered 2020-08-13: 10 mL
  Filled 2020-08-13: qty 10

## 2020-08-13 NOTE — Patient Instructions (Signed)

## 2020-08-29 ENCOUNTER — Other Ambulatory Visit: Payer: Self-pay | Admitting: Hematology

## 2020-08-29 ENCOUNTER — Encounter: Payer: Self-pay | Admitting: Hematology

## 2020-08-29 ENCOUNTER — Inpatient Hospital Stay (HOSPITAL_BASED_OUTPATIENT_CLINIC_OR_DEPARTMENT_OTHER): Payer: Medicare Other | Admitting: Hematology

## 2020-08-29 ENCOUNTER — Other Ambulatory Visit: Payer: Self-pay

## 2020-08-29 ENCOUNTER — Inpatient Hospital Stay: Payer: Medicare Other

## 2020-08-29 VITALS — BP 131/76 | HR 82 | Temp 97.8°F | Resp 18 | Ht 66.0 in | Wt 151.4 lb

## 2020-08-29 DIAGNOSIS — R197 Diarrhea, unspecified: Secondary | ICD-10-CM

## 2020-08-29 DIAGNOSIS — C182 Malignant neoplasm of ascending colon: Secondary | ICD-10-CM

## 2020-08-29 MED ORDER — DIPHENOXYLATE-ATROPINE 2.5-0.025 MG PO TABS: 1.0000 | ORAL_TABLET | Freq: Four times a day (QID) | ORAL | 1 refills | Status: DC | PRN

## 2020-08-29 NOTE — Progress Notes (Unsigned)
GI panel

## 2020-08-29 NOTE — Progress Notes (Signed)
[place]   [phone] Fax:([phone]) [phone]   Clinic Follow up Note   Patient Care Team: Lajean Manes, MD as PCP - General (Internal Medicine) Berle Mull, MD as Consulting Physician (Family Medicine) Clarene Essex, MD as Consulting Physician (Gastroenterology)  Date of Service:  08/29/2020  CHIEF COMPLAINT: F/u ofmetastaticcolon cancer   SUMMARY OF ONCOLOGIC HISTORY: Oncology History Overview Note  Cancer Staging Cancer of right colon [facility]) Staging form: Colon and Rectum, AJCC 8th Edition - Clinical stage from 04/09/2019: Stage IVC (cTX, cNX, pM1c) - Signed by Truitt Merle, MD on 05/03/2019     Cancer of right colon [facility])  04/09/2019 Procedure   Colonoscopy 04/09/19 by Dr Watt Climes IMPRESSION -internal hemorrhoids -Diverticulosis in the sigmoid colon  -2 small polyps in the rectum and in the proximal transverse colon, removed with a hot snare. Resected and retrieved.  -3 medium polyps in the proximal transverse colon, in the mid transverse colon and in the distal transverse colon, removed and resected and retrieved.  -likely malignant partially obstructing tumor at the hepatic flexure. biopsied, tattooed.  -1 large polyp in the mid ascending colon  -the examination was otherwise normal     04/09/2019 Initial Biopsy   FINAL MICROSCOPIC DIAGNOSIS: 04/09/19 1. LG intestine-hepatic flexure, Biopsy:   INVASIVE WELL DIFFERENTIATED ADENOCARCINOMA   04/09/2019 Cancer Staging   Staging form: Colon and Rectum, AJCC 8th Edition - Clinical stage from 04/09/2019: Stage IVC (cTX, cNX, pM1c) - Signed by Truitt Merle, MD on 05/03/2019   04/10/2019 Imaging   CT AP 04/10/19  IMPRESSION: 1. There is an eccentric mass of the colon involving the ascending colon near the hepatic flexure measuring approximately 3.4 x 3.4 by 2.0 cm (series 2, image 37, series 3, image 37). There is extensive soft tissue nodularity of the mesocolon and omentum, and likely areas of the  peritoneum, for example bilateral upper quadrants (series 2, image 25). Findings are consistent with primary colon malignancy, probable omental and peritoneal involvement, and small volume malignant ascites. 2.  Other chronic and incidental findings as detailed above.   04/20/2019 Initial Diagnosis   Cancer of right colon ([place])   04/26/2019 Imaging   CT Chest 04/26/19 IMPRESSION: 1. Borderline to mild lower thoracic adenopathy, including within the right internal mammary and juxta cardiophrenic stations. Given the appearance of the upper abdomen, suspicious for nodal metastasis. 2. A low right paratracheal node is borderline sized, but favored to be reactive. 3. No evidence of pulmonary metastasis. 4. Peritoneal metastasis and abdominal ascites, as before. 5. Pancreatic parenchymal calcifications indicative of chronic calcific pancreatitis. 6. Coronary artery atherosclerosis.   04/27/2019 Pathology Results   Diagnosis 04/27/19 Peritoneum, biopsy, right upper quadrant, perihepatic - ADENOCARCINOMA, CONSISTENT WITH COLONIC PRIMARY. - SEE COMMENT.   05/16/2019 - 10/31/2019 Chemotherapy   First line FOLFOX every 2 weeks with Avastin starting with cycle 2 starting 05/16/19. 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 Stopped after 10/31/19 for surgery.    07/23/2019 Imaging    CT AP W Contrast  IMPRESSION: 1. Primary ascending colon mass may be minimally smaller. Peritoneal metastatic disease appears slightly improved as well. 2. Chronic calcific pancreatitis. 3. Tiny left renal stone. 4. Small left inguinal hernia contains a knuckle of unobstructed colon. 5. Enlarged prostate.   10/29/2019 Imaging   CT AP W Contrast IMPRESSION: No significant change in small ascending colon soft  tissue mass and diffuse omental  carcinomatosis.   No new or progressive metastatic disease identified.   Stable small left inguinal hernia containing a loop of sigmoid colon. No evidence of bowel obstruction or strangulation.   Colonic diverticulosis. No radiographic evidence of diverticulitis.   Stable enlarged prostate.   12/17/2019 Pathology Results   HIPEC Surgery by Dr Clovis Riley 12/17/19 FINAL PATHOLOGIC DIAGNOSIS  MICROSCOPIC EXAMINATION AND DIAGNOSIS  A.          "RIGHT COLON, OMENTUM, SPLEEN":       Invasive adenocarcinoma with mucinous features, moderately differentiated.  Tumor involves mesentery and spleen.  Five out of ten lymph nodes involved by adenocarcinoma with perinodal soft tissue involvement (5/10), see comment.  Margins are uninvolved by invasive carcinoma.  Ileum and appendix, uninvolved.  See comment and cancer case summary.  B.          "ROUND LIGAMENT OF LIVER":       Involved by adenocarcinoma with mucinous features, moderately differentiated.  C.          "RIGHT DIAPHRAM STRIPPING":       Negative for carcinoma.  D.          "RIGHT HEPATIC CAPSULECTOMY":       Chronic inflammation and fibrosis.  Negative for carcinoma.  E.          "DEBRIDED TUMOR":       Involved by adenocarcinoma with mucinous features, moderately differentiated.   COMMENT: Sections disclose five out of ten lymph nodes involved by adenocarcinoma with perinodal soft tissue involvement. However, it is not possible to be certain if this represents lymph nodes replaced by tumor and/or soft tissue involvement. In addition, focal perineural invasion is seen.   04/03/2020 Imaging   CT AP W contrast  IMPRESSION: 1. Status post interval splenectomy and right hemicolectomy. 2. Response to therapy of omental/peritoneal metastasis. The only residual indeterminate finding is fluid density along the capsule of the hepatic dome which could be postoperative or secondary to localized residual peritoneal disease. 3. Left  nephrolithiasis. 4.  Possible constipation. 5. Prostatomegaly. 6.  Aortic Atherosclerosis (ICD10-I70.0).   07/11/2020 Imaging   CT AP  1.  Postsurgical changes of subtle reductive surgery including right hemicolectomy, omentectomy, splenectomy, and right hepatic capsulectomy.  2.  Interval development of multiple areas of low attenuation involving the liver, as described above. This the intraparenchymal low-density lesion is concerning for metastatic disease. Other disease along the capsule may be post therapy related. Residual thickening of the anterior peritoneum particularly anterior to the right lobe of the liver is also concerning for some residual disease although appears significantly improved. Recommend attention on follow-up.  3.  No definite evidence of residual peritoneal disease status post HIPEC.      CURRENT THERAPY:  Surveillance  INTERVAL HISTORY:  Nathaniel Norton is here for a follow up. He presents to the clinic alone. He notes he saw Dr Clovis Riley in late July after CT scan and labs. He was to repeat scan with Dr Clovis Riley 3 months later to compare. He notes his upper abdominal pain in July and did not recur since. He did have more frequent BM every 15-20 minutes in 1 day which improved as the week went. Imodium helped more his sprinter control and urgency but less help with his upset stomach with less stool output. He also notes his urine output is less as he is not drinking as much as before. He denies dysuria. He notes he did have prostate biopsy  before starting chemo with IR in [place]. He still does not have a set urologist. Last week he did feel dizzy upon standing and again this morning.  He notes he was recently stung by several hornets. He denies cough, SOB or chest discomfort. He notes his fatigue and energy is stable.     REVIEW OF SYSTEMS:   Constitutional: Denies fevers, chills or abnormal weight loss (+) Dizziness upon standing Eyes: Denies blurriness of vision Ears,  nose, mouth, throat, and face: Denies mucositis or sore throat Respiratory: Denies cough, dyspnea or wheezes Cardiovascular: Denies palpitation, chest discomfort or lower extremity swelling Gastrointestinal:  Denies nausea, heartburn (+) Increased BMs with urgency Skin: Denies abnormal skin rashes Lymphatics: Denies new lymphadenopathy or easy bruising Neurological:Denies numbness, tingling or new weaknesses Behavioral/Psych: Mood is stable, no new changes  All other systems were reviewed with the patient and are negative.  MEDICAL HISTORY:  Past Medical History:  Diagnosis Date  . colon ca dx'd 03/2019    SURGICAL HISTORY: Past Surgical History:  Procedure Laterality Date  . CATARACT EXTRACTION    . IR IMAGING GUIDED PORT INSERTION  05/10/2019  . L4 fracture  2012  . RETINAL DETACHMENT SURGERY    . Right hand surgery  1974    I have reviewed the social history and family history with the patient and they are unchanged from previous note.  ALLERGIES:  is allergic to feraheme [ferumoxytol] and codeine.  MEDICATIONS:  Current Outpatient Medications  Medication Sig Dispense Refill  . diphenoxylate-atropine (LOMOTIL) 2.5-0.025 MG tablet Take 1-2 tablets by mouth 4 (four) times daily as needed for diarrhea or loose stools. 30 tablet 1  . ferrous sulfate 325 (65 FE) MG tablet Take 325 mg by mouth daily with breakfast.    . fluticasone (FLONASE) 50 MCG/ACT nasal spray Place into the nose.    . lidocaine-prilocaine (EMLA) cream Apply to affected area once 30 g 3  . mirtazapine (REMERON) 7.5 MG tablet Take 1 tablet (7.5 mg total) by mouth at bedtime. 30 tablet 1  . Multiple Vitamin (MULTIVITAMIN) tablet Take 1 tablet by mouth daily.    . ondansetron (ZOFRAN) 8 MG tablet Take 1 tablet (8 mg total) by mouth 2 (two) times daily as needed for refractory nausea / vomiting. Start on day 3 after chemotherapy. 30 tablet 1  . prochlorperazine (COMPAZINE) 10 MG tablet Take 1 tablet (10 mg total)  by mouth every 6 (six) hours as needed (Nausea or vomiting). 30 tablet 1   No current facility-administered medications for this visit.    PHYSICAL EXAMINATION: ECOG PERFORMANCE STATUS: 2 - Symptomatic, <50% confined to bed  Vitals:   08/29/20 1355 08/29/20 1356  BP: 119/73 131/76  Pulse: 94 82  Resp: 18 18  Temp:    SpO2: 97% 98%   Filed Weights   08/29/20 1304  Weight: 151 lb 6.4 oz (68.7 kg)    GENERAL:alert, no distress and comfortable SKIN: skin color, texture, turgor are normal, no rashes or significant lesions EYES: normal, Conjunctiva are pink and non-injected, sclera clear  NECK: supple, thyroid normal size, non-tender, without nodularity LYMPH:  no palpable lymphadenopathy in the cervical, axillary  LUNGS: clear to auscultation and percussion with normal breathing effort HEART: regular rate & rhythm and no murmurs and no lower extremity edema ABDOMEN:abdomen soft, non-tender and normal bowel sounds (+) Very mild left abdominal tenderness Musculoskeletal:no cyanosis of digits and no clubbing  NEURO: alert & oriented x 3 with fluent speech, no focal motor/sensory deficits  LABORATORY DATA:  I have reviewed the data as listed CBC Latest Ref Rng & Units 06/27/2020 04/03/2020 02/08/2020  WBC 4.0 - 10.5 K/uL 6.5 7.3 7.5  Hemoglobin 13.0 - 17.0 g/dL 14.6 13.3 11.4(L)  Hematocrit 39 - 52 % 44.4 41.1 35.9(L)  Platelets 150 - 400 K/uL 202 267 347     CMP Latest Ref Rng & Units 06/27/2020 04/03/2020 02/08/2020  Glucose 70 - 99 mg/dL 89 95 129(H)  BUN 8 - 23 mg/dL _0 Creatinine 0.61 - 1.24 mg/dL 0.89 0.82 0.73  Sodium 135 - 145 mmol/L 140 140 138  Potassium 3.5 - 5.1 mmol/L 4.2 4.1 4.3  Chloride 98 - 111 mmol/L 105 104 103  CO2 22 - 32 mmol/L _1 Calcium 8.9 - 10.3 mg/dL 9.7 9.6 9.3  Total Protein 6.5 - 8.1 g/dL 7.2 7.1 6.9  Total Bilirubin 0.3 - 1.2 mg/dL 1.8(H) 1.0 0.7  Alkaline Phos 38 - 126 U/L 65 69 133(H)  AST 15 - 41 U/L _2 ALT 0 - 44 U/L _3 RADIOGRAPHIC STUDIES: I have personally reviewed the radiological images as listed and agreed with the findings in the report. No results found.   ASSESSMENT & PLAN:  Kit Mollett is a 69 y.o. male with    1. Abdominal Pain, Frequent Loose stools, stool incontinence  -He has had abdominal pressure since mid July. This has mildly progressed but intermittent in the last week. He can take Tylenol for mild pain.  -He notes in the past 3 weeks having frequent BMs with urgency. Imodium helped with urgency and sphincter control but still had upset stomach so he stopped it.  -He denies fever or chills. Physical exam today shows very mild left abdominal tenderness.  -He does have concern for liver lesions on CT AP in 06/2020. I recommend PET scan or MRI for further devaluation. He is agreeable.  -For his BM, I recommend he continue imodium and I will call in lomotil today (08/29/20) and increase water intake. I also suggest stool sample to evaluate for infection, he is agreeable.  -He notes 2 episodes of dizziness upon standing including this morning. I checked orthostatic VS, he was not orthostatic, I encouraged him to increase oral liquid   2. Cancer of right colon,with peritoneal metastasis,stage IV,MMR normal, KRAS mutation (+), MSS -He wasdiagnosed in 03/2019. His Colonoscopy biopsy showed adenocarcinoma of right hepatic flexurecolon.His peritoneal biopsy from 04/27/19 showsadenocarcinoma,consistent withmetastasis of his colon cancer. This is now stage IV disease. -He was treated with first-line FOLFOX in June-November 2020 before proceeding with HIPEC surgeryby Dr. Dustin Folks 12/17/19. -I do not plan to give more chemotherapy at this time. He will continue with cancer surveillance post surgery. -His 07/11/20 CT AP Interval development of multiple small lesion of low attenuation involving the liver concerning for metastatic disease. Dr. Clovis Riley plans to repeat scan 3 months  later. Given the clinical concerns for recurrence, I recommend PET scan in next 1-2 weeks. He is agreeable.    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 Watt Climes showed internal hemorrhoids, benign polyps, diverticulosis and colon cancer.  -He was treated with oral iron, IV Feraheme on 05/03/19 and 05/16/19 (had reaction with 2nd dose). -Anemia resolved after chemo and HIPEC surgery. 06/2020 iron panel was mildly low.   4. Elevated PSA  -PSA was 5.7 in 11/2018 and 4.6 in 01/2019 -He has  prostatomegaly as seen on prior scans. -He notes recent reduced urine output along with reduced fluid intake. Will evaluate on PET or MRI this month.    PLAN: -GI panel on stool sample today  -I called in lomotil today  -PET scan in 1-2 weeks.  -F/u after scan    No problem-specific Assessment & Plan notes found for this encounter.   Orders Placed This Encounter  Procedures  . NM PET Image Initial (PI) Skull Base To Thigh    Abnormal CT scan finding in April and July ([facility]) 2021, f/u and rule out cancer recurrence    Standing Status:   Future    Standing Expiration Date:   08/29/2021    Order Specific Question:   If indicated for the ordered procedure, I authorize the administration of a radiopharmaceutical per Radiology protocol    Answer:   Yes    Order Specific Question:   Preferred imaging location?    Answer:   [place]    Order Specific Question:   Release to patient    Answer:   Immediate    Order Specific Question:   Radiology Contrast Protocol - do NOT remove file path    Answer:   [email or web address]  . Gastrointestinal Pathogen Panel PCR   All questions were answered. The patient knows to call the clinic with any problems, questions or concerns. No barriers to learning was detected. The total time spent in the appointment was 30 minutes.     Yan Feng, MD 08/29/2020   I,  Amoya Bennett, am acting as scribe for Yan Feng, MD.   I have reviewed the above documentation for accuracy and completeness, and I agree with the above.       

## 2020-08-30 LAB — GASTROINTESTINAL PANEL BY PCR, STOOL (REPLACES STOOL CULTURE)

## 2020-09-01 ENCOUNTER — Telehealth: Payer: Self-pay | Admitting: Hematology

## 2020-09-01 ENCOUNTER — Telehealth: Payer: Self-pay

## 2020-09-01 NOTE — Telephone Encounter (Signed)
Pt states lomotil is working better than the imodium in stating he doesn't feel urgency or rumbling in his stomach he does still feel he has some minute bleeding when wiping but states it is drastically improved and does not have diarrhea at present

## 2020-09-01 NOTE — Telephone Encounter (Signed)
Scheduled per 9/17 los. Pt is aware of appt times and dates.

## 2020-09-01 NOTE — Telephone Encounter (Signed)
-----   Message from Truitt Merle, MD sent at 09/01/2020  7:54 AM EDT ----- Please let pt know his stool test was negative, check if he is using imodium and lomotil appropriately and if his diarrhea improved. Thanks   Truitt Merle  09/01/2020

## 2020-09-05 ENCOUNTER — Inpatient Hospital Stay: Payer: Medicare Other

## 2020-09-05 ENCOUNTER — Other Ambulatory Visit: Payer: Self-pay

## 2020-09-05 DIAGNOSIS — C182 Malignant neoplasm of ascending colon: Secondary | ICD-10-CM

## 2020-09-05 DIAGNOSIS — D500 Iron deficiency anemia secondary to blood loss (chronic): Secondary | ICD-10-CM

## 2020-09-05 DIAGNOSIS — Z95828 Presence of other vascular implants and grafts: Secondary | ICD-10-CM

## 2020-09-05 LAB — CMP (CANCER CENTER ONLY)
ALT: 20 U/L (ref 0–44)
AST: 20 U/L (ref 15–41)
Albumin: 3.8 g/dL (ref 3.5–5.0)
Alkaline Phosphatase: 71 U/L (ref 38–126)
Anion gap: 5 (ref 5–15)
BUN: 10 mg/dL (ref 8–23)
CO2: 30 mmol/L (ref 22–32)
Calcium: 9.4 mg/dL (ref 8.9–10.3)
Chloride: 104 mmol/L (ref 98–111)
Creatinine: 0.78 mg/dL (ref 0.61–1.24)
GFR, Est AFR Am: >60 mL/min (ref >60)
GFR, Estimated: >60 mL/min (ref >60)
Glucose, Bld: 92 mg/dL (ref 70–99)
Potassium: 3.9 mmol/L (ref 3.5–5.1)
Sodium: 139 mmol/L (ref 135–145)
Total Bilirubin: 1.5 mg/dL — ABNORMAL HIGH (ref 0.3–1.2)
Total Protein: 7 g/dL (ref 6.5–8.1)

## 2020-09-05 LAB — CBC WITH DIFFERENTIAL (CANCER CENTER ONLY)
Abs Immature Granulocytes: 0.01 10*3/uL (ref 0.00–0.07)
Basophils Absolute: 0.1 10*3/uL (ref 0.0–0.1)
Basophils Relative: 1 %
Eosinophils Absolute: 0.3 10*3/uL (ref 0.0–0.5)
Eosinophils Relative: 4 %
HCT: 41.3 % (ref 39.0–52.0)
Hemoglobin: 13.6 g/dL (ref 13.0–17.0)
Immature Granulocytes: 0 %
Lymphocytes Relative: 31 %
Lymphs Abs: 2.1 10*3/uL (ref 0.7–4.0)
MCH: 29.1 pg (ref 26.0–34.0)
MCHC: 32.9 g/dL (ref 30.0–36.0)
MCV: 88.2 fL (ref 80.0–100.0)
Monocytes Absolute: 0.8 10*3/uL (ref 0.1–1.0)
Monocytes Relative: 11 %
Neutro Abs: 3.6 10*3/uL (ref 1.7–7.7)
Neutrophils Relative %: 53 %
Platelet Count: 237 10*3/uL (ref 150–400)
RBC: 4.68 MIL/uL (ref 4.22–5.81)
RDW: 15 % (ref 11.5–15.5)
WBC Count: 6.8 10*3/uL (ref 4.0–10.5)
nRBC: 0 % (ref 0.0–0.2)

## 2020-09-05 LAB — FERRITIN: Ferritin: 123 ng/mL (ref 24–336)

## 2020-09-05 LAB — CEA (IN HOUSE-CHCC): CEA (CHCC-In House): 7.23 ng/mL — ABNORMAL HIGH (ref 0.00–5.00)

## 2020-09-05 MED ORDER — HEPARIN SOD (PORK) LOCK FLUSH 100 UNIT/ML IV SOLN
500.0000 [IU] | Freq: Once | INTRAVENOUS | Status: AC | PRN
  Administered 2020-09-05: 500 [IU]
  Filled 2020-09-05: qty 5

## 2020-09-05 MED ORDER — SODIUM CHLORIDE 0.9% FLUSH
10.0000 mL | INTRAVENOUS | Status: DC | PRN
  Administered 2020-09-05: 10 mL
  Filled 2020-09-05: qty 10

## 2020-09-10 ENCOUNTER — Ambulatory Visit (HOSPITAL_COMMUNITY)
Admission: RE | Admit: 2020-09-10 | Discharge: 2020-09-10 | Disposition: A | Discharge status: Home / Self Care | Payer: Medicare Other | Source: Ambulatory Visit | Attending: Hematology | Admitting: Hematology

## 2020-09-10 ENCOUNTER — Other Ambulatory Visit: Payer: Self-pay

## 2020-09-10 DIAGNOSIS — C182 Malignant neoplasm of ascending colon: Secondary | ICD-10-CM

## 2020-09-10 DIAGNOSIS — Z9049 Acquired absence of other specified parts of digestive tract: Secondary | ICD-10-CM | POA: Diagnosis not present

## 2020-09-10 DIAGNOSIS — Z9081 Acquired absence of spleen: Secondary | ICD-10-CM | POA: Diagnosis not present

## 2020-09-10 LAB — GLUCOSE, CAPILLARY: Glucose-Capillary: 93 mg/dL (ref 70–99)

## 2020-09-10 MED ORDER — FLUDEOXYGLUCOSE F - 18 (FDG) INJECTION
7.2600 | Freq: Once | INTRAVENOUS | Status: AC | PRN
  Administered 2020-09-10: 7.26 via INTRAVENOUS

## 2020-09-10 NOTE — Progress Notes (Signed)
[place]   Telephone:([phone] [phone]   Clinic Follow up Note   Patient Care Team: Lajean Manes, MD as PCP - General (Internal Medicine) Berle Mull, MD as Consulting Physician (Family Medicine) Clarene Essex, MD as Consulting Physician (Gastroenterology)  Date of Service:  09/11/2020  CHIEF COMPLAINT: F/u ofmetastaticcolon cancer   SUMMARY OF ONCOLOGIC HISTORY: Oncology History Overview Note  Cancer Staging Cancer of right colon [facility]) Staging form: Colon and Rectum, AJCC 8th Edition - Clinical stage from 04/09/2019: Stage IVC (cTX, cNX, pM1c) - Signed by Truitt Merle, MD on 05/03/2019     Cancer of right colon [facility])  04/09/2019 Procedure   Colonoscopy 04/09/19 by Dr Watt Climes IMPRESSION -internal hemorrhoids -Diverticulosis in the sigmoid colon  -2 small polyps in the rectum and in the proximal transverse colon, removed with a hot snare. Resected and retrieved.  -3 medium polyps in the proximal transverse colon, in the mid transverse colon and in the distal transverse colon, removed and resected and retrieved.  -likely malignant partially obstructing tumor at the hepatic flexure. biopsied, tattooed.  -1 large polyp in the mid ascending colon  -the examination was otherwise normal     04/09/2019 Initial Biopsy   FINAL MICROSCOPIC DIAGNOSIS: 04/09/19 1. LG intestine-hepatic flexure, Biopsy:   INVASIVE WELL DIFFERENTIATED ADENOCARCINOMA   04/09/2019 Cancer Staging   Staging form: Colon and Rectum, AJCC 8th Edition - Clinical stage from 04/09/2019: Stage IVC (cTX, cNX, pM1c) - Signed by Truitt Merle, MD on 05/03/2019   04/10/2019 Imaging   CT AP 04/10/19  IMPRESSION: 1. There is an eccentric mass of the colon involving the ascending colon near the hepatic flexure measuring approximately 3.4 x 3.4 by 2.0 cm (series 2, image 37, series 3, image 37). There is extensive soft tissue nodularity of the mesocolon and omentum, and likely areas of the  peritoneum, for example bilateral upper quadrants (series 2, image 25). Findings are consistent with primary colon malignancy, probable omental and peritoneal involvement, and small volume malignant ascites. 2.  Other chronic and incidental findings as detailed above.   04/20/2019 Initial Diagnosis   Cancer of right colon ([place])   04/26/2019 Imaging   CT Chest 04/26/19 IMPRESSION: 1. Borderline to mild lower thoracic adenopathy, including within the right internal mammary and juxta cardiophrenic stations. Given the appearance of the upper abdomen, suspicious for nodal metastasis. 2. A low right paratracheal node is borderline sized, but favored to be reactive. 3. No evidence of pulmonary metastasis. 4. Peritoneal metastasis and abdominal ascites, as before. 5. Pancreatic parenchymal calcifications indicative of chronic calcific pancreatitis. 6. Coronary artery atherosclerosis.   04/27/2019 Pathology Results   Diagnosis 04/27/19 Peritoneum, biopsy, right upper quadrant, perihepatic - ADENOCARCINOMA, CONSISTENT WITH COLONIC PRIMARY. - SEE COMMENT.   05/16/2019 - 10/31/2019 Chemotherapy   First line FOLFOX every 2 weeks with Avastin starting with cycle 2 starting 05/16/19. 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 Stopped after 10/31/19 for surgery.    07/23/2019 Imaging    CT AP W Contrast  IMPRESSION: 1. Primary ascending colon mass may be minimally smaller. Peritoneal metastatic disease appears slightly improved as well. 2. Chronic calcific pancreatitis. 3. Tiny left renal stone. 4. Small left inguinal hernia contains a knuckle of unobstructed colon. 5. Enlarged prostate.   10/29/2019 Imaging   CT AP W Contrast IMPRESSION: No significant change in small ascending colon soft  tissue mass and diffuse omental  carcinomatosis.   No new or progressive metastatic disease identified.   Stable small left inguinal hernia containing a loop of sigmoid colon. No evidence of bowel obstruction or strangulation.   Colonic diverticulosis. No radiographic evidence of diverticulitis.   Stable enlarged prostate.   12/17/2019 Pathology Results   HIPEC Surgery by Dr Clovis Riley 12/17/19 FINAL PATHOLOGIC DIAGNOSIS  MICROSCOPIC EXAMINATION AND DIAGNOSIS  A.          "RIGHT COLON, OMENTUM, SPLEEN":       Invasive adenocarcinoma with mucinous features, moderately differentiated.  Tumor involves mesentery and spleen.  Five out of ten lymph nodes involved by adenocarcinoma with perinodal soft tissue involvement (5/10), see comment.  Margins are uninvolved by invasive carcinoma.  Ileum and appendix, uninvolved.  See comment and cancer case summary.  B.          "ROUND LIGAMENT OF LIVER":       Involved by adenocarcinoma with mucinous features, moderately differentiated.  C.          "RIGHT DIAPHRAM STRIPPING":       Negative for carcinoma.  D.          "RIGHT HEPATIC CAPSULECTOMY":       Chronic inflammation and fibrosis.  Negative for carcinoma.  E.          "DEBRIDED TUMOR":       Involved by adenocarcinoma with mucinous features, moderately differentiated.   COMMENT: Sections disclose five out of ten lymph nodes involved by adenocarcinoma with perinodal soft tissue involvement. However, it is not possible to be certain if this represents lymph nodes replaced by tumor and/or soft tissue involvement. In addition, focal perineural invasion is seen.   04/03/2020 Imaging   CT AP W contrast  IMPRESSION: 1. Status post interval splenectomy and right hemicolectomy. 2. Response to therapy of omental/peritoneal metastasis. The only residual indeterminate finding is fluid density along the capsule of the hepatic dome which could be postoperative or secondary to localized residual peritoneal disease. 3. Left  nephrolithiasis. 4.  Possible constipation. 5. Prostatomegaly. 6.  Aortic Atherosclerosis (ICD10-I70.0).   07/11/2020 Imaging   CT AP  1.  Postsurgical changes of subtle reductive surgery including right hemicolectomy, omentectomy, splenectomy, and right hepatic capsulectomy.  2.  Interval development of multiple areas of low attenuation involving the liver, as described above. This the intraparenchymal low-density lesion is concerning for metastatic disease. Other disease along the capsule may be post therapy related. Residual thickening of the anterior peritoneum particularly anterior to the right lobe of the liver is also concerning for some residual disease although appears significantly improved. Recommend attention on follow-up.  3.  No definite evidence of residual peritoneal disease status post HIPEC.      CURRENT THERAPY:  Pending second line chemo FOLFIRI and bevacizumab  INTERVAL HISTORY:  Nathaniel Norton is here for a follow up. He presents to the clinic alone.  He is clinically doing well overall, his only concern is his diarrhea, which has been much better controlled with Lomotil.  Occasional abdominal cramps, no nausea, vomiting, or other new symptoms.  He has good appetite. Weight is stable.  All other systems were reviewed with the patient and are negative.  MEDICAL HISTORY:  Past Medical History:  Diagnosis Date  . colon ca dx'd 03/2019    SURGICAL HISTORY: Past Surgical History:  Procedure Laterality Date  . CATARACT EXTRACTION    . IR IMAGING GUIDED PORT INSERTION  05/10/2019  . L4  fracture  2012  . RETINAL DETACHMENT SURGERY    . Right hand surgery  1974    I have reviewed the social history and family history with the patient and they are unchanged from previous note.  ALLERGIES:  is allergic to feraheme [ferumoxytol] and codeine.  MEDICATIONS:  Current Outpatient Medications  Medication Sig Dispense Refill  . diphenoxylate-atropine (LOMOTIL) 2.5-0.025  MG tablet Take 1-2 tablets by mouth 4 (four) times daily as needed for diarrhea or loose stools. 30 tablet 1  . ferrous sulfate 325 (65 FE) MG tablet Take 325 mg by mouth daily with breakfast.    . fluticasone (FLONASE) 50 MCG/ACT nasal spray Place into the nose.    . lidocaine-prilocaine (EMLA) cream Apply to affected area once 30 g 3  . mirtazapine (REMERON) 7.5 MG tablet Take 1 tablet (7.5 mg total) by mouth at bedtime. 30 tablet 1  . Multiple Vitamin (MULTIVITAMIN) tablet Take 1 tablet by mouth daily.    . ondansetron (ZOFRAN) 8 MG tablet Take 1 tablet (8 mg total) by mouth 2 (two) times daily as needed for refractory nausea / vomiting. Start on day 3 after chemotherapy. 30 tablet 1  . prochlorperazine (COMPAZINE) 10 MG tablet Take 1 tablet (10 mg total) by mouth every 6 (six) hours as needed (Nausea or vomiting). 30 tablet 1   No current facility-administered medications for this visit.    PHYSICAL EXAMINATION: ECOG PERFORMANCE STATUS: 1 - Symptomatic but completely ambulatory  Vitals:   09/11/20 0756  BP: 126/72  Pulse: 60  Resp: 20  Temp: 97.8 F (36.6 C)  SpO2: 100%   Filed Weights   09/11/20 0756  Weight: 152 lb 12.8 oz (69.3 kg)    GENERAL:alert, no distress and comfortable SKIN: skin color, texture, turgor are normal, no rashes or significant lesions EYES: normal, Conjunctiva are pink and non-injected, sclera clear NECK: supple, thyroid normal size, non-tender, without nodularity LYMPH:  no palpable lymphadenopathy in the cervical, axillary  LUNGS: clear to auscultation and percussion with normal breathing effort HEART: regular rate & rhythm and no murmurs and no lower extremity edema ABDOMEN:abdomen soft, non-tender and normal bowel sounds Musculoskeletal:no cyanosis of digits and no clubbing  NEURO: alert & oriented x 3 with fluent speech, no focal motor/sensory deficits  LABORATORY DATA:  I have reviewed the data as listed CBC Latest Ref Rng & Units 09/05/2020  06/27/2020 04/03/2020  WBC 4.0 - 10.5 K/uL 6.8 6.5 7.3  Hemoglobin 13.0 - 17.0 g/dL 13.6 14.6 13.3  Hematocrit 39 - 52 % 41.3 44.4 41.1  Platelets 150 - 400 K/uL 237 202 267     CMP Latest Ref Rng & Units 09/05/2020 06/27/2020 04/03/2020  Glucose 70 - 99 mg/dL 92 89 95  BUN 8 - 23 mg/dL '10 15 10  ' Creatinine 0.61 - 1.24 mg/dL 0.78 0.89 0.82  Sodium 135 - 145 mmol/L 139 140 140  Potassium 3.5 - 5.1 mmol/L 3.9 4.2 4.1  Chloride 98 - 111 mmol/L 104 105 104  CO2 22 - 32 mmol/L '30 26 27  ' Calcium 8.9 - 10.3 mg/dL 9.4 9.7 9.6  Total Protein 6.5 - 8.1 g/dL 7.0 7.2 7.1  Total Bilirubin 0.3 - 1.2 mg/dL 1.5(H) 1.8(H) 1.0  Alkaline Phos 38 - 126 U/L 71 65 69  AST 15 - 41 U/L '20 18 21  ' ALT 0 - 44 U/L '20 12 12      ' RADIOGRAPHIC STUDIES: I have personally reviewed the radiological images as listed and agreed with the  findings in the report. NM PET Image Initial (PI) Skull Base To Thigh  Result Date: 09/10/2020 CLINICAL DATA:  Initial treatment strategy for right colon cancer. EXAM: NUCLEAR MEDICINE PET SKULL BASE TO THIGH TECHNIQUE: 7.3 mCi F-18 FDG was injected intravenously. Full-ring PET imaging was performed from the skull base to thigh after the radiotracer. CT data was obtained and used for attenuation correction and anatomic localization. Fasting blood glucose: 93 mg/dl COMPARISON:  CT abdomen/pelvis dated 04/03/2020 FINDINGS: Mediastinal blood pool activity: SUV max 2.7 Liver activity: SUV max NA NECK: No hypermetabolic cervical lymphadenopathy. Incidental CT findings: none CHEST: Small hypermetabolic left axillary nodes, max SUV 3.4, likely reactive in the setting of recent COVID vaccination. No suspicious mediastinal lymphadenopathy. Numerous pleural-based lesions in the right hemithorax, approximately 20-25 in number. Representative max SUV 8.9. These findings are compatible with pleural-based metastases. Associated trace right pleural effusion. No suspicious pulmonary nodules. Right chest port  terminates the cavoatrial junction. Incidental CT findings: Mild coronary atherosclerosis of the LAD and right coronary artery. ABDOMEN/PELVIS: Status post right hemicolectomy and splenectomy. Hypermetabolism in the porta hepatis, max SUV 7.4. Additional scattered foci of perihepatic hypermetabolism, reflecting peritoneal disease, representative max SUV 4.4. Additional 11 mm peritoneal implant in the right mid abdomen (series 4/image 133), max SUV 9.7. 13 mm implant beneath the left paramidline anterior abdominal wall, adjacent to the transverse colon, max SUV 7.6. Rectosigmoid diverticulosis. Mass-like wall thickening involving the rectosigmoid colon (series 4/image 135), with marked hypermetabolism (max SUV 20.6), although involving a relatively long segment and favoring colitis/diverticulitis over neoplasm. Incidental CT findings: Two nonobstructing left renal calculi measuring 2-3 mm. Right renal cysts. Atherosclerotic calcifications the abdominal aorta and branch vessels. Prostatomegaly, reflecting BPH. SKELETON: No focal hypermetabolic activity to suggest skeletal metastasis. Incidental CT findings: Degenerative changes of the visualized thoracolumbar spine. IMPRESSION: Status post right hemicolectomy and splenectomy. Perihepatic and upper/mid abdominal peritoneal implants, as above. Numerous pleural-based metastases in the right hemithorax, as above. Masslike wall thickening involving the rectosigmoid colon, favoring colitis/diverticulitis over neoplasm. However, follow-up flexible sigmoidoscopy is suggested. Electronically Signed   By: Julian Hy M.D.   On: 09/10/2020 14:35     ASSESSMENT & PLAN:  Matai Carpenito is a 69 y.o. male with    1. Abdominal Pain, Frequent Loose stools, stool incontinence  -He has had abdominal pressure since mid July. This has mildly progressed but intermittent in the last week. He can take Tylenol for mild pain.  -He notes in the past 3 weeks having frequent BMs  with urgency. Imodium helped with urgency and sphincter control but still had upset stomach so he stopped it.  -This is much better controlled with Lomotil, will continue   2. Cancer of right colon,with peritoneal metastasis,stage IV,MMR normal, KRAS mutation (+), MSS -He wasdiagnosed in 03/2019. His Colonoscopy biopsy showed adenocarcinoma of right hepatic flexurecolon.His peritoneal biopsy from 04/27/19 showsadenocarcinoma,consistent withmetastasis of his colon cancer. This is now stage IV disease. -He was treated with first-line FOLFOX in June-November 2020 before proceeding with HIPEC surgeryby Dr. Dustin Folks 12/17/19. -I reviewed his recent PET scan images with patient in person, and discussed the findings.  He has hypermetabolic pleural (rigt) and peritoneal nodules, which are consistent with metastatic disease.  I do not think we need biopsy to confirm given his previously known peritoneal metastasis -We again discussed the incurable nature of all his metastatic disease, and poor prognosis. -I recommend second line chemotherapy FOLFIRI and bevacizumab --Chemotherapy consent: Side effects including but does not not limited to, fatigue,  nausea, vomiting, diarrhea, hair loss, neuropathy, fluid retention, renal and kidney dysfunction, neutropenic fever, needed for blood transfusion, bleeding, were discussed with patient in great detail. She agrees to proceed. 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 Watt Climes showed internal hemorrhoids, benign polyps, diverticulosis and colon cancer.  -He was treated with oral iron,IV Feraheme on 05/03/19 and 05/16/19(had reaction with 2nd dose). -Anemiaresolved after chemo and HIPEC surgery. 06/2020 iron panel was mildly low.   4. Elevated PSA    -PSA was 5.7 in 11/2018 and 4.6 in 01/2019 -He has prostatomegaly as seen on prior scans. -He notes recent reduced urine output along with reduced fluid intake. Will evaluate on PET or MRI this month.   5. Goal of care discussion  -We again discussed the incurable nature of his cancer, and the overall poor prognosis, especially if he does not have good response to chemotherapy or progress on chemo -The patient understands the goal of care is palliative. -he is full code now   PLAN: -PET scan reviewed, consistent with metastatic disease -We will start second line chemotherapy FOLFIRI and bevacizumab next week  -f/u one week after first cycle chemo    No problem-specific Assessment & Plan notes found for this encounter.   No orders of the defined types were placed in this encounter.  All questions were answered. The patient knows to call the clinic with any problems, questions or concerns. No barriers to learning was detected. The total time spent in the appointment was 40 minutes.     Truitt Merle, MD 09/11/2020   I, Joslyn Devon, am acting as scribe for Truitt Merle, MD.   I have reviewed the above documentation for accuracy and completeness, and I agree with the above.

## 2020-09-11 ENCOUNTER — Inpatient Hospital Stay (HOSPITAL_BASED_OUTPATIENT_CLINIC_OR_DEPARTMENT_OTHER): Payer: Medicare Other | Admitting: Hematology

## 2020-09-11 ENCOUNTER — Encounter: Payer: Self-pay | Admitting: Hematology

## 2020-09-11 ENCOUNTER — Other Ambulatory Visit: Payer: Self-pay

## 2020-09-11 ENCOUNTER — Telehealth: Payer: Self-pay | Admitting: Hematology

## 2020-09-11 VITALS — BP 126/72 | HR 60 | Temp 97.8°F | Resp 20 | Ht 66.0 in | Wt 152.8 lb

## 2020-09-11 DIAGNOSIS — C182 Malignant neoplasm of ascending colon: Secondary | ICD-10-CM | POA: Diagnosis not present

## 2020-09-11 DIAGNOSIS — Z23 Encounter for immunization: Secondary | ICD-10-CM | POA: Diagnosis not present

## 2020-09-11 MED ORDER — DIPHENOXYLATE-ATROPINE 2.5-0.025 MG PO TABS: 1.0000 | ORAL_TABLET | Freq: Four times a day (QID) | ORAL | 1 refills | Status: AC | PRN

## 2020-09-11 MED ORDER — INFLUENZA VAC A&B SA ADJ QUAD 0.5 ML IM PRSY
0.5000 mL | PREFILLED_SYRINGE | Freq: Once | INTRAMUSCULAR | Status: AC
  Administered 2020-09-11: 0.5 mL via INTRAMUSCULAR

## 2020-09-11 MED ORDER — INFLUENZA VAC A&B SA ADJ QUAD 0.5 ML IM PRSY
PREFILLED_SYRINGE | INTRAMUSCULAR | Status: AC
  Filled 2020-09-11: qty 0.5

## 2020-09-11 NOTE — Telephone Encounter (Signed)
Scheduled per los. Gave avs and calendar. Advised will call about infusion for 10/7 once approved

## 2020-09-11 NOTE — Progress Notes (Signed)
DISCONTINUE ON PATHWAY REGIMEN - Colorectal     A cycle is every 14 days:     Oxaliplatin      Leucovorin      5-Fluorouracil      5-Fluorouracil      Bevacizumab-xxxx   **Always confirm dose/schedule in your pharmacy ordering system**  REASON: Disease Progression PRIOR TREATMENT: [id number]: [id number] + Bevacizumab TREATMENT RESPONSE: Unable to Evaluate  START ON PATHWAY REGIMEN - Colorectal     A cycle is every 14 days:     Bevacizumab-xxxx      Irinotecan      Leucovorin      Fluorouracil      Fluorouracil   **Always confirm dose/schedule in your pharmacy ordering system**  Patient Characteristics: Distant Metastases, Nonsurgical Candidate, KRAS/NRAS Mutation Positive/Unknown (BRAF V600 Wild-Type/Unknown), Standard Cytotoxic Therapy, Second Line Standard Cytotoxic Therapy, Bevacizumab Eligible Tumor Location: Colon Therapeutic Status: Distant Metastases Microsatellite/Mismatch Repair Status: MSS/pMMR BRAF Mutation Status: Wild-Type (no mutation) KRAS/NRAS Mutation Status: Mutation Positive Standard Cytotoxic Line of Therapy: Second Line Standard Cytotoxic Therapy Bevacizumab Eligibility: Eligible Intent of Therapy: Non-Curative / Palliative Intent, Discussed with Patient

## 2020-09-11 NOTE — Progress Notes (Signed)
ON PATHWAY REGIMEN - Colorectal  No Change  Continue With Treatment as Ordered.  Original Decision Date/Time: 05/03/2019 20:50     A cycle is every 14 days:     Oxaliplatin      Leucovorin      5-Fluorouracil      5-Fluorouracil      Bevacizumab-xxxx   **Always confirm dose/schedule in your pharmacy ordering system**  Patient Characteristics: Distant Metastases, First Line, Nonsurgical Candidate, KRAS Mutation Positive/Unknown, BRAF Wild-Type/Unknown, PS = 0,1; Bevacizumab Eligible Therapeutic Status: Distant Metastases BRAF Mutation Status: Awaiting Test Results KRAS/NRAS Mutation Status: Awaiting Test Results Line of Therapy: First Line ECOG Performance Status: 1 Bevacizumab Eligibility: Eligible Intent of Therapy: Non-Curative / Palliative Intent, Discussed with Patient

## 2020-09-12 NOTE — Progress Notes (Signed)
Pharmacist Chemotherapy Monitoring - Initial Assessment    Anticipated start date: 09/18/20   Regimen:  . Are orders appropriate based on the patient's diagnosis, regimen, and cycle? Yes . Does the plan date match the patient's scheduled date? Yes . Is the sequencing of drugs appropriate? Yes . Are the premedications appropriate for the patient's regimen? Yes . Prior Authorization for treatment is: Pending o If applicable, is the correct biosimilar selected based on the patient's insurance? not applicable  Organ Function and Labs: Marland Kitchen Are dose adjustments needed based on the patient's renal function, hepatic function, or hematologic function? No . Are appropriate labs ordered prior to the start of patient's treatment? Yes . Other organ system assessment, if indicated: N/A . The following baseline labs, if indicated, have been ordered: N/A  Dose Assessment: . Are the drug doses appropriate? Yes . Are the following correct: o Drug concentrations Yes o IV fluid compatible with drug Yes o Administration routes Yes o Timing of therapy Yes . If applicable, does the patient have documented access for treatment and/or plans for port-a-cath placement? not applicable . If applicable, have lifetime cumulative doses been properly documented and assessed? not applicable Lifetime Dose Tracking  . Oxaliplatin: 630.233 mg/m2 (1,170 mg) = 105.04 % of the maximum lifetime dose of 600 mg/m2  o   Toxicity Monitoring/Prevention: . The patient has the following take home antiemetics prescribed: N/A . The patient has the following take home medications prescribed: N/A . Medication allergies and previous infusion related reactions, if applicable, have been reviewed and addressed. Yes . The patient's current medication list has been assessed for drug-drug interactions with their chemotherapy regimen. no significant drug-drug interactions were identified on review.  Order Review: . Are the treatment plan  orders signed? Yes . Is the patient scheduled to see a provider prior to their treatment? Yes  I verify that I have reviewed each item in the above checklist and answered each question accordingly.  Cerise Lieber K 09/12/2020 2:37 PM

## 2020-09-15 NOTE — Progress Notes (Signed)
Nathaniel Norton   [phone] [phone]   Clinic Follow up Note   Patient Care Team: Lajean Manes, MD as PCP - General (Internal Medicine) Berle Mull, MD as Consulting Physician (Family Medicine) Clarene Essex, MD as Consulting Physician (Gastroenterology)  Date of Service:  09/18/2020  CHIEF COMPLAINT: F/u ofmetastaticcolon cancer  SUMMARY OF ONCOLOGIC HISTORY: Oncology History Overview Note  Cancer Staging Cancer of right colon [facility]) Staging form: Colon and Rectum, AJCC 8th Edition - Clinical stage from 04/09/2019: Stage IVC (cTX, cNX, pM1c) - Signed by Truitt Merle, MD on 05/03/2019     Cancer of right colon [facility])  04/09/2019 Procedure   Colonoscopy 04/09/19 by Dr Watt Climes IMPRESSION -internal hemorrhoids -Diverticulosis in the sigmoid colon  -2 small polyps in the rectum and in the proximal transverse colon, removed with a hot snare. Resected and retrieved.  -3 medium polyps in the proximal transverse colon, in the mid transverse colon and in the distal transverse colon, removed and resected and retrieved.  -likely malignant partially obstructing tumor at the hepatic flexure. biopsied, tattooed.  -1 large polyp in the mid ascending colon  -the examination was otherwise normal     04/09/2019 Initial Biopsy   FINAL MICROSCOPIC DIAGNOSIS: 04/09/19 1. LG intestine-hepatic flexure, Biopsy:   INVASIVE WELL DIFFERENTIATED ADENOCARCINOMA   04/09/2019 Cancer Staging   Staging form: Colon and Rectum, AJCC 8th Edition - Clinical stage from 04/09/2019: Stage IVC (cTX, cNX, pM1c) - Signed by Truitt Merle, MD on 05/03/2019   04/10/2019 Imaging   CT AP 04/10/19  IMPRESSION: 1. There is an eccentric mass of the colon involving the ascending colon near the hepatic flexure measuring approximately 3.4 x 3.4 by 2.0 cm (series 2, image 37, series 3, image 37). There is extensive soft tissue nodularity of the mesocolon and omentum, and likely areas of the  peritoneum, for example bilateral upper quadrants (series 2, image 25). Findings are consistent with primary colon malignancy, probable omental and peritoneal involvement, and small volume malignant ascites. 2.  Other chronic and incidental findings as detailed above.   04/20/2019 Initial Diagnosis   Cancer of right colon ([place])   04/26/2019 Imaging   CT Chest 04/26/19 IMPRESSION: 1. Borderline to mild lower thoracic adenopathy, including within the right internal mammary and juxta cardiophrenic stations. Given the appearance of the upper abdomen, suspicious for nodal metastasis. 2. A low right paratracheal node is borderline sized, but favored to be reactive. 3. No evidence of pulmonary metastasis. 4. Peritoneal metastasis and abdominal ascites, as before. 5. Pancreatic parenchymal calcifications indicative of chronic calcific pancreatitis. 6. Coronary artery atherosclerosis.   04/27/2019 Pathology Results   Diagnosis 04/27/19 Peritoneum, biopsy, right upper quadrant, perihepatic - ADENOCARCINOMA, CONSISTENT WITH COLONIC PRIMARY. - SEE COMMENT.   05/16/2019 - 10/31/2019 Chemotherapy   First line FOLFOX every 2 weeks with Avastin starting with cycle 2 starting 05/16/19. 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 Stopped after 10/31/19 for surgery.    07/23/2019 Imaging    CT AP W Contrast  IMPRESSION: 1. Primary ascending colon mass may be minimally smaller. Peritoneal metastatic disease appears slightly improved as well. 2. Chronic calcific pancreatitis. 3. Tiny left renal stone. 4. Small left inguinal hernia contains a knuckle of unobstructed colon. 5. Enlarged prostate.   10/29/2019 Imaging   CT AP W Contrast IMPRESSION: No significant change in small ascending colon soft tissue  mass and diffuse omental  carcinomatosis.   No new or progressive metastatic disease identified.   Stable small left inguinal hernia containing a loop of sigmoid colon. No evidence of bowel obstruction or strangulation.   Colonic diverticulosis. No radiographic evidence of diverticulitis.   Stable enlarged prostate.   12/17/2019 Pathology Results   HIPEC Surgery by Dr Clovis Riley 12/17/19 FINAL PATHOLOGIC DIAGNOSIS  MICROSCOPIC EXAMINATION AND DIAGNOSIS  A.          "RIGHT COLON, OMENTUM, SPLEEN":       Invasive adenocarcinoma with mucinous features, moderately differentiated.  Tumor involves mesentery and spleen.  Five out of ten lymph nodes involved by adenocarcinoma with perinodal soft tissue involvement (5/10), see comment.  Margins are uninvolved by invasive carcinoma.  Ileum and appendix, uninvolved.  See comment and cancer case summary.  B.          "ROUND LIGAMENT OF LIVER":       Involved by adenocarcinoma with mucinous features, moderately differentiated.  C.          "RIGHT DIAPHRAM STRIPPING":       Negative for carcinoma.  D.          "RIGHT HEPATIC CAPSULECTOMY":       Chronic inflammation and fibrosis.  Negative for carcinoma.  E.          "DEBRIDED TUMOR":       Involved by adenocarcinoma with mucinous features, moderately differentiated.   COMMENT: Sections disclose five out of ten lymph nodes involved by adenocarcinoma with perinodal soft tissue involvement. However, it is not possible to be certain if this represents lymph nodes replaced by tumor and/or soft tissue involvement. In addition, focal perineural invasion is seen.   04/03/2020 Imaging   CT AP W contrast  IMPRESSION: 1. Status post interval splenectomy and right hemicolectomy. 2. Response to therapy of omental/peritoneal metastasis. The only residual indeterminate finding is fluid density along the capsule of the hepatic dome which could be postoperative or secondary to localized residual peritoneal disease. 3. Left  nephrolithiasis. 4.  Possible constipation. 5. Prostatomegaly. 6.  Aortic Atherosclerosis (ICD10-I70.0).   07/11/2020 Imaging   CT AP  1.  Postsurgical changes of subtle reductive surgery including right hemicolectomy, omentectomy, splenectomy, and right hepatic capsulectomy.  2.  Interval development of multiple areas of low attenuation involving the liver, as described above. This the intraparenchymal low-density lesion is concerning for metastatic disease. Other disease along the capsule may be post therapy related. Residual thickening of the anterior peritoneum particularly anterior to the right lobe of the liver is also concerning for some residual disease although appears significantly improved. Recommend attention on follow-up.  3.  No definite evidence of residual peritoneal disease status post HIPEC.   09/18/2020 -  Chemotherapy   Second line chemo FOLFIRI and bevacizumab q2weeks starting 09/18/20      CURRENT THERAPY:  Second line chemo FOLFIRI and bevacizumab q2weeks starting 09/18/20  INTERVAL HISTORY:  Nathaniel Norton is here for a follow up and for cycle of chemotherapy. He presents to the clinic alone.  Since he increased Lomotil from 3 to 4 tablets a day, his diarrhea has improved.  He is eating fairly well, no other new complaints.  All other systems were reviewed with the patient and are negative.  MEDICAL HISTORY:  Past Medical History:  Diagnosis Date  . colon ca dx'd 03/2019    SURGICAL HISTORY: Past Surgical History:  Procedure Laterality Date  . CATARACT EXTRACTION    . IR  IMAGING GUIDED PORT INSERTION  05/10/2019  . L4 fracture  2012  . RETINAL DETACHMENT SURGERY    . Right hand surgery  1974    I have reviewed the social history and family history with the patient and they are unchanged from previous note.  ALLERGIES:  is allergic to feraheme [ferumoxytol] and codeine.  MEDICATIONS:  Current Outpatient Medications  Medication Sig Dispense Refill  .  cyclobenzaprine (FLEXERIL) 5 MG tablet TAKE 1 TABLET BY MOUTH 3 TIMES DAILY AS NEEDED FOR UP TO 5 DAYS FOR MUSCLE SPASMS (MUSCULAR PAIN).    Marland Kitchen diphenoxylate-atropine (LOMOTIL) 2.5-0.025 MG tablet Take 1-2 tablets by mouth 4 (four) times daily as needed for diarrhea or loose stools. 90 tablet 1  . ferrous sulfate 325 (65 FE) MG tablet Take 325 mg by mouth daily with breakfast.    . fluticasone (FLONASE) 50 MCG/ACT nasal spray Place into the nose.    . mirtazapine (REMERON) 7.5 MG tablet Take 1 tablet (7.5 mg total) by mouth at bedtime. 30 tablet 1  . Multiple Vitamin (MULTIVITAMIN) tablet Take 1 tablet by mouth daily.     No current facility-administered medications for this visit.    PHYSICAL EXAMINATION: ECOG PERFORMANCE STATUS: 1 - Symptomatic but completely ambulatory  Vitals:   09/18/20 0852  BP: 125/72  Pulse: 64  Resp: 18  Temp: 97.7 F (36.5 C)  SpO2: 100%   Filed Weights   09/18/20 0852  Weight: 151 lb 3.7 oz (68.6 kg)    GENERAL:alert, no distress and comfortable SKIN: skin color, texture, turgor are normal, no rashes or significant lesions EYES: normal, Conjunctiva are pink and non-injected, sclera clear Musculoskeletal:no cyanosis of digits and no clubbing  NEURO: alert & oriented x 3 with fluent speech, no focal motor/sensory deficits  LABORATORY DATA:  I have reviewed the data as listed CBC Latest Ref Rng & Units 09/18/2020 09/05/2020 06/27/2020  WBC 4.0 - 10.5 K/uL 7.0 6.8 6.5  Hemoglobin 13.0 - 17.0 g/dL 13.3 13.6 14.6  Hematocrit 39 - 52 % 40.2 41.3 44.4  Platelets 150 - 400 K/uL 275 237 202     CMP Latest Ref Rng & Units 09/18/2020 09/05/2020 06/27/2020  Glucose 70 - 99 mg/dL 100(H) 92 89  BUN 8 - 23 mg/dL _0 Creatinine 0.61 - 1.24 mg/dL 0.82 0.78 0.89  Sodium 135 - 145 mmol/L 139 139 140  Potassium 3.5 - 5.1 mmol/L 4.1 3.9 4.2  Chloride 98 - 111 mmol/L 103 104 105  CO2 22 - 32 mmol/L _1 Calcium 8.9 - 10.3 mg/dL 9.7 9.4 9.7  Total Protein  6.5 - 8.1 g/dL 7.1 7.0 7.2  Total Bilirubin 0.3 - 1.2 mg/dL 1.6(H) 1.5(H) 1.8(H)  Alkaline Phos 38 - 126 U/L 81 71 65  AST 15 - 41 U/L _2 ALT 0 - 44 U/L _3 RADIOGRAPHIC STUDIES: I have personally reviewed the radiological images as listed and agreed with the findings in the report. No results found.   ASSESSMENT & PLAN:  Nathaniel Norton is a 69 y.o. male with    1. Cancer of right colon,with peritoneal and lung metastasis,stage IV,MMR normal, KRAS mutation (+), MSS -He wasdiagnosed in 03/2019. His Colonoscopy biopsy showed adenocarcinoma of right hepatic flexurecolon.His peritoneal biopsy from 04/27/19 showsadenocarcinoma,consistent withmetastasis of his colon cancer. This is now stage IV disease. -He was treated with first-line FOLFOX in June-November 2020 before proceeding with HIPECsurgeryby Dr. Dustin Folks 12/17/19. -Unfortunately  he had disease progression on 09/10/20 PET with hypermetabolic pleural (rigt) and peritoneal nodules, which are consistent with metastatic disease. I do not think we need biopsy to confirm given his previously known peritoneal metastasis -I recommend second-line chemo with FOLFIRI and bevacizumab q2weeks. Plan to start today  -Chemo side effects reviewed with patient again, especially management of diarrhea, consent obtained -We will proceed for cycle today -Follow-up in 2 weeks   2.Abdominal Pain, Frequent Loose stools, stool incontinence  -He has had abdominal pressure since mid July. This has mildly progressed but intermittent in the last week. He can take Tylenol for mild pain.  -He notes in the past 3 weeks having frequent BMs with urgency. Imodium helped with urgency and sphincter control but still had upset stomach so he stopped it.  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 Watt Climes showed internal hemorrhoids, benign polyps, diverticulosis and colon cancer.  -He was treated with oral iron,IV Feraheme on 05/03/19 and 05/16/19(had reaction with 2nd dose). -Anemiaresolved after chemo and HIPEC surgery.06/2020 iron panel was mildly low.  4. Elevated PSA  -PSA was 5.7 in 11/2018 and 4.6 in 01/2019 -He has prostatomegaly as seen onpriorscans. -He notes recent reduced urine output along with reduced fluid intake. Will evaluate on PET or MRI this month.  5. Goal of care discussion  -The patient understands the goal of care is palliative. -he is full code now   6. Mild hyperbilirubinemia -Overall mild and stable, will continue monitoring.   PLAN: -Lab reviewed, adequate for treatment, will proceed for cycle FOLFIRI and bevacizumab today -He knows to call me if he develops severe side effects after chemo -Lab, follow-up and second cycle chemotherapy in 2 weeks   No problem-specific Assessment & Plan notes found for this encounter.   No orders of the defined types were placed in this encounter.  All questions were answered. The patient knows to call the clinic with any problems, questions or concerns. No barriers to learning was detected.      Truitt Merle, MD 09/18/2020   I, Joslyn Devon, am acting as scribe for Truitt Merle, MD.   I have reviewed the above documentation for accuracy and completeness, and I agree with the above.

## 2020-09-18 ENCOUNTER — Other Ambulatory Visit: Payer: Self-pay

## 2020-09-18 ENCOUNTER — Inpatient Hospital Stay: Payer: Medicare Other | Attending: Hematology | Admitting: Hematology

## 2020-09-18 ENCOUNTER — Inpatient Hospital Stay: Payer: Medicare Other

## 2020-09-18 ENCOUNTER — Encounter: Payer: Self-pay | Admitting: Hematology

## 2020-09-18 VITALS — BP 125/72 | HR 64 | Temp 97.7°F | Resp 18 | Ht 66.0 in | Wt 151.2 lb

## 2020-09-18 VITALS — BP 128/77 | HR 78 | Resp 18

## 2020-09-18 DIAGNOSIS — K648 Other hemorrhoids: Secondary | ICD-10-CM | POA: Insufficient documentation

## 2020-09-18 DIAGNOSIS — R188 Other ascites: Secondary | ICD-10-CM | POA: Diagnosis not present

## 2020-09-18 DIAGNOSIS — K5790 Diverticulosis of intestine, part unspecified, without perforation or abscess without bleeding: Secondary | ICD-10-CM | POA: Insufficient documentation

## 2020-09-18 DIAGNOSIS — Z79899 Other long term (current) drug therapy: Secondary | ICD-10-CM | POA: Insufficient documentation

## 2020-09-18 DIAGNOSIS — C786 Secondary malignant neoplasm of retroperitoneum and peritoneum: Secondary | ICD-10-CM | POA: Insufficient documentation

## 2020-09-18 DIAGNOSIS — Z9081 Acquired absence of spleen: Secondary | ICD-10-CM | POA: Insufficient documentation

## 2020-09-18 DIAGNOSIS — K621 Rectal polyp: Secondary | ICD-10-CM | POA: Insufficient documentation

## 2020-09-18 DIAGNOSIS — C182 Malignant neoplasm of ascending colon: Secondary | ICD-10-CM | POA: Diagnosis present

## 2020-09-18 DIAGNOSIS — D122 Benign neoplasm of ascending colon: Secondary | ICD-10-CM | POA: Insufficient documentation

## 2020-09-18 DIAGNOSIS — Z9049 Acquired absence of other specified parts of digestive tract: Secondary | ICD-10-CM | POA: Insufficient documentation

## 2020-09-18 DIAGNOSIS — Z452 Encounter for adjustment and management of vascular access device: Secondary | ICD-10-CM | POA: Insufficient documentation

## 2020-09-18 DIAGNOSIS — R159 Full incontinence of feces: Secondary | ICD-10-CM | POA: Diagnosis not present

## 2020-09-18 DIAGNOSIS — D500 Iron deficiency anemia secondary to blood loss (chronic): Secondary | ICD-10-CM | POA: Insufficient documentation

## 2020-09-18 DIAGNOSIS — Z95828 Presence of other vascular implants and grafts: Secondary | ICD-10-CM

## 2020-09-18 DIAGNOSIS — N400 Enlarged prostate without lower urinary tract symptoms: Secondary | ICD-10-CM | POA: Diagnosis not present

## 2020-09-18 DIAGNOSIS — Z885 Allergy status to narcotic agent status: Secondary | ICD-10-CM | POA: Insufficient documentation

## 2020-09-18 DIAGNOSIS — R972 Elevated prostate specific antigen [PSA]: Secondary | ICD-10-CM | POA: Diagnosis not present

## 2020-09-18 DIAGNOSIS — Z5112 Encounter for antineoplastic immunotherapy: Secondary | ICD-10-CM | POA: Diagnosis present

## 2020-09-18 LAB — CBC WITH DIFFERENTIAL (CANCER CENTER ONLY)
Abs Immature Granulocytes: 0.01 10*3/uL (ref 0.00–0.07)
Basophils Absolute: 0.1 10*3/uL (ref 0.0–0.1)
Basophils Relative: 1 %
Eosinophils Absolute: 0.2 10*3/uL (ref 0.0–0.5)
Eosinophils Relative: 3 %
HCT: 40.2 % (ref 39.0–52.0)
Hemoglobin: 13.3 g/dL (ref 13.0–17.0)
Immature Granulocytes: 0 %
Lymphocytes Relative: 29 %
Lymphs Abs: 2 10*3/uL (ref 0.7–4.0)
MCH: 29.8 pg (ref 26.0–34.0)
MCHC: 33.1 g/dL (ref 30.0–36.0)
MCV: 89.9 fL (ref 80.0–100.0)
Monocytes Absolute: 0.9 10*3/uL (ref 0.1–1.0)
Monocytes Relative: 13 %
Neutro Abs: 3.8 10*3/uL (ref 1.7–7.7)
Neutrophils Relative %: 54 %
Platelet Count: 275 10*3/uL (ref 150–400)
RBC: 4.47 MIL/uL (ref 4.22–5.81)
RDW: 14.7 % (ref 11.5–15.5)
WBC Count: 7 10*3/uL (ref 4.0–10.5)
nRBC: 0 % (ref 0.0–0.2)

## 2020-09-18 LAB — CMP (CANCER CENTER ONLY)
ALT: 20 U/L (ref 0–44)
AST: 20 U/L (ref 15–41)
Albumin: 3.7 g/dL (ref 3.5–5.0)
Alkaline Phosphatase: 81 U/L (ref 38–126)
Anion gap: 5 (ref 5–15)
BUN: 13 mg/dL (ref 8–23)
CO2: 31 mmol/L (ref 22–32)
Calcium: 9.7 mg/dL (ref 8.9–10.3)
Chloride: 103 mmol/L (ref 98–111)
Creatinine: 0.82 mg/dL (ref 0.61–1.24)
GFR, Estimated: >60 mL/min (ref >60)
Glucose, Bld: 100 mg/dL — ABNORMAL HIGH (ref 70–99)
Potassium: 4.1 mmol/L (ref 3.5–5.1)
Sodium: 139 mmol/L (ref 135–145)
Total Bilirubin: 1.6 mg/dL — ABNORMAL HIGH (ref 0.3–1.2)
Total Protein: 7.1 g/dL (ref 6.5–8.1)

## 2020-09-18 MED ORDER — SODIUM CHLORIDE 0.9 % IV SOLN
400.0000 mg/m2 | Freq: Once | INTRAVENOUS | Status: AC
  Administered 2020-09-18: 720 mg via INTRAVENOUS
  Filled 2020-09-18: qty 36

## 2020-09-18 MED ORDER — PALONOSETRON HCL INJECTION 0.25 MG/5ML
0.2500 mg | Freq: Once | INTRAVENOUS | Status: AC
  Administered 2020-09-18: 0.25 mg via INTRAVENOUS

## 2020-09-18 MED ORDER — SODIUM CHLORIDE 0.9 % IV SOLN
150.0000 mg/m2 | Freq: Once | INTRAVENOUS | Status: AC
  Administered 2020-09-18: 280 mg via INTRAVENOUS
  Filled 2020-09-18: qty 14

## 2020-09-18 MED ORDER — PALONOSETRON HCL INJECTION 0.25 MG/5ML
INTRAVENOUS | Status: AC
  Filled 2020-09-18: qty 5

## 2020-09-18 MED ORDER — SODIUM CHLORIDE 0.9% FLUSH
10.0000 mL | INTRAVENOUS | Status: DC | PRN
  Administered 2020-09-18: 10 mL
  Filled 2020-09-18: qty 10

## 2020-09-18 MED ORDER — SODIUM CHLORIDE 0.9 % IV SOLN
Freq: Once | INTRAVENOUS | Status: AC
  Filled 2020-09-18: qty 250

## 2020-09-18 MED ORDER — SODIUM CHLORIDE 0.9 % IV SOLN
INTRAVENOUS | Status: DC
  Filled 2020-09-18 (×2): qty 250

## 2020-09-18 MED ORDER — SODIUM CHLORIDE 0.9 % IV SOLN
5.0000 mg/kg | Freq: Once | INTRAVENOUS | Status: AC
  Administered 2020-09-18: 350 mg via INTRAVENOUS
  Filled 2020-09-18: qty 14

## 2020-09-18 MED ORDER — SODIUM CHLORIDE 0.9 % IV SOLN
10.0000 mg | Freq: Once | INTRAVENOUS | Status: AC
  Administered 2020-09-18: 10 mg via INTRAVENOUS
  Filled 2020-09-18: qty 10

## 2020-09-18 MED ORDER — FLUOROURACIL CHEMO INJECTION 2.5 GM/50ML
400.0000 mg/m2 | Freq: Once | INTRAVENOUS | Status: AC
  Administered 2020-09-18: 700 mg via INTRAVENOUS
  Filled 2020-09-18: qty 14

## 2020-09-18 MED ORDER — ATROPINE SULFATE 1 MG/ML IJ SOLN
INTRAMUSCULAR | Status: AC
  Filled 2020-09-18: qty 1

## 2020-09-18 MED ORDER — SODIUM CHLORIDE 0.9 % IV SOLN
2400.0000 mg/m2 | INTRAVENOUS | Status: DC
  Administered 2020-09-18: 4300 mg via INTRAVENOUS
  Filled 2020-09-18: qty 86

## 2020-09-18 MED ORDER — ATROPINE SULFATE 1 MG/ML IJ SOLN
0.5000 mg | Freq: Once | INTRAMUSCULAR | Status: AC | PRN
  Administered 2020-09-18: 0.5 mg via INTRAVENOUS

## 2020-09-18 NOTE — Patient Instructions (Signed)

## 2020-09-18 NOTE — Progress Notes (Signed)
Patient complained of slight stomach discomfort and dizziness following near completion of irinotecan and leucovorin infusion. Patient received 0.5 mg dose of atropine. Patient still complaining of some dizziness. Vital signs taken and discussed with Dr. Burr Medico. Received orders for patient to receive 300 ml of fluid over 30 minutes. Charge approved of change of appointment schedule.

## 2020-09-18 NOTE — Patient Instructions (Addendum)
[facility] Discharge Instructions for Patients Receiving Chemotherapy  Today you received the following chemotherapy agents: Avastin, Irinotecan, Leucovorin, and Fluorouracil  To help prevent nausea and vomiting after your treatment, we encourage you to take your nausea medication  as prescribed.    If you develop nausea and vomiting that is not controlled by your nausea medication, call the clinic.   BELOW ARE SYMPTOMS THAT SHOULD BE REPORTED IMMEDIATELY:  *FEVER GREATER THAN 100.5 F  *CHILLS WITH OR WITHOUT FEVER  NAUSEA AND VOMITING THAT IS NOT CONTROLLED WITH YOUR NAUSEA MEDICATION  *UNUSUAL SHORTNESS OF BREATH  *UNUSUAL BRUISING OR BLEEDING  TENDERNESS IN MOUTH AND THROAT WITH OR WITHOUT PRESENCE OF ULCERS  *URINARY PROBLEMS  *BOWEL PROBLEMS  UNUSUAL RASH Items with * indicate a potential emergency and should be followed up as soon as possible.  Feel free to call the clinic should you have any questions or concerns. The clinic phone number is [phone].  Please show the [facility] at check-in to the Emergency Department and triage nurse.  Irinotecan injection What is this medicine? IRINOTECAN (ir in oh TEE kan ) is a chemotherapy drug. It is used to treat colon and rectal cancer. This medicine may be used for other purposes; ask your health care provider or pharmacist if you have questions. COMMON BRAND NAME(S): Camptosar What should I tell my health care provider before I take this medicine? They need to know if you have any of these conditions:  dehydration  diarrhea  infection (especially a virus infection such as chickenpox, cold sores, or herpes)  liver disease  low blood counts, like low white cell, platelet, or red cell counts  low levels of calcium, magnesium, or potassium in the blood  recent or ongoing radiation therapy  an unusual or allergic reaction to irinotecan, other medicines, foods, dyes, or  preservatives  pregnant or trying to get pregnant  breast-feeding How should I use this medicine? This drug is given as an infusion into a vein. It is administered in a hospital or clinic by a specially trained health care professional. Talk to your pediatrician regarding the use of this medicine in children. Special care may be needed. Overdosage: If you think you have taken too much of this medicine contact a poison control center or emergency room at once. NOTE: This medicine is only for you. Do not share this medicine with others. What if I miss a dose? It is important not to miss your dose. Call your doctor or health care professional if you are unable to keep an appointment. What may interact with this medicine? This medicine may interact with the following medications:  antiviral medicines for HIV or AIDS  certain antibiotics like rifampin or rifabutin  certain medicines for fungal infections like itraconazole, ketoconazole, posaconazole, and voriconazole  certain medicines for seizures like carbamazepine, phenobarbital, phenotoin  clarithromycin  gemfibrozil  nefazodone  St. John's Wort This list may not describe all possible interactions. Give your health care provider a list of all the medicines, herbs, non-prescription drugs, or dietary supplements you use. Also tell them if you smoke, drink alcohol, or use illegal drugs. Some items may interact with your medicine. What should I watch for while using this medicine? Your condition will be monitored carefully while you are receiving this medicine. You will need important blood work done while you are taking this medicine. This drug may make you feel generally unwell. This is not uncommon, as chemotherapy can affect healthy cells  as well as cancer cells. Report any side effects. Continue your course of treatment even though you feel ill unless your doctor tells you to stop. In some cases, you may be given additional  medicines to help with side effects. Follow all directions for their use. You may get drowsy or dizzy. Do not drive, use machinery, or do anything that needs mental alertness until you know how this medicine affects you. Do not stand or sit up quickly, especially if you are an older patient. This reduces the risk of dizzy or fainting spells. Call your health care professional for advice if you get a fever, chills, or sore throat, or other symptoms of a cold or flu. Do not treat yourself. This medicine decreases your body's ability to fight infections. Try to avoid being around people who are sick. Avoid taking products that contain aspirin, acetaminophen, ibuprofen, naproxen, or ketoprofen unless instructed by your doctor. These medicines may hide a fever. This medicine may increase your risk to bruise or bleed. Call your doctor or health care professional if you notice any unusual bleeding. Be careful brushing and flossing your teeth or using a toothpick because you may get an infection or bleed more easily. If you have any dental work done, tell your dentist you are receiving this medicine. Do not become pregnant while taking this medicine or for 6 months after stopping it. Women should inform their health care professional if they wish to become pregnant or think they might be pregnant. Men should not father a child while taking this medicine and for 3 months after stopping it. There is potential for serious side effects to an unborn child. Talk to your health care professional for more information. Do not breast-feed an infant while taking this medicine or for 7 days after stopping it. This medicine has caused ovarian failure in some women. This medicine may make it more difficult to get pregnant. Talk to your health care professional if you are concerned about your fertility. This medicine has caused decreased sperm counts in some men. This may make it more difficult to father a child. Talk to your  health care professional if you are concerned about your fertility. What side effects may I notice from receiving this medicine? Side effects that you should report to your doctor or health care professional as soon as possible:  allergic reactions like skin rash, itching or hives, swelling of the face, lips, or tongue  chest pain  diarrhea  flushing, runny nose, sweating during infusion  low blood counts - this medicine may decrease the number of white blood cells, red blood cells and platelets. You may be at increased risk for infections and bleeding.  nausea, vomiting  pain, swelling, warmth in the leg  signs of decreased platelets or bleeding - bruising, pinpoint red spots on the skin, black, tarry stools, blood in the urine  signs of infection - fever or chills, cough, sore throat, pain or difficulty passing urine  signs of decreased red blood cells - unusually weak or tired, fainting spells, lightheadedness Side effects that usually do not require medical attention (report to your doctor or health care professional if they continue or are bothersome):  constipation  hair loss  headache  loss of appetite  mouth sores  stomach pain This list may not describe all possible side effects. Call your doctor for medical advice about side effects. You may report side effects to FDA at 1-800-FDA-1088. Where should I keep my medicine? This drug is  given in a hospital or clinic and will not be stored at home. NOTE: This sheet is a summary. It may not cover all possible information. If you have questions about this medicine, talk to your doctor, pharmacist, or health care provider.  2020 Elsevier/Gold Standard (2019-01-19 10:09:17)

## 2020-09-18 NOTE — Progress Notes (Signed)
Per Dr. Burr Medico - okay to treat with elevated bilirubin and no urine protein today.

## 2020-09-19 ENCOUNTER — Telehealth: Payer: Self-pay | Admitting: Hematology

## 2020-09-19 NOTE — Telephone Encounter (Signed)
Scheduled per 10/7 los. Pt is aware of appt times and dates.

## 2020-09-20 ENCOUNTER — Inpatient Hospital Stay: Payer: Medicare Other

## 2020-09-20 ENCOUNTER — Other Ambulatory Visit: Payer: Self-pay

## 2020-09-20 VITALS — BP 131/74 | HR 68 | Temp 97.5°F | Resp 18

## 2020-09-20 DIAGNOSIS — Z5112 Encounter for antineoplastic immunotherapy: Secondary | ICD-10-CM | POA: Diagnosis not present

## 2020-09-20 MED ORDER — SODIUM CHLORIDE 0.9% FLUSH
10.0000 mL | INTRAVENOUS | Status: DC | PRN
  Administered 2020-09-20: 10 mL
  Filled 2020-09-20: qty 10

## 2020-09-20 MED ORDER — HEPARIN SOD (PORK) LOCK FLUSH 100 UNIT/ML IV SOLN
500.0000 [IU] | Freq: Once | INTRAVENOUS | Status: AC | PRN
  Administered 2020-09-20: 500 [IU]
  Filled 2020-09-20: qty 5

## 2020-09-26 NOTE — Progress Notes (Signed)
Nathaniel Norton   Telephone:([phone]) [phone] Fax:(336) [phone]   Clinic Follow up Note   Patient Care Team: Lajean Manes, MD as PCP - General (Internal Medicine) Berle Mull, MD as Consulting Physician (Family Medicine) Clarene Essex, MD as Consulting Physician (Gastroenterology)  Date of Service:  10/01/2020  CHIEF COMPLAINT: F/u ofmetastaticcolon cancer  SUMMARY OF ONCOLOGIC HISTORY: Oncology History Overview Note  Cancer Staging Cancer of right colon [facility]) Staging form: Colon and Rectum, AJCC 8th Edition - Clinical stage from 04/09/2019: Stage IVC (cTX, cNX, pM1c) - Signed by Truitt Merle, MD on 05/03/2019     Cancer of right colon [facility])  04/09/2019 Procedure   Colonoscopy 04/09/19 by Dr Watt Climes IMPRESSION -internal hemorrhoids -Diverticulosis in the sigmoid colon  -2 small polyps in the rectum and in the proximal transverse colon, removed with a hot snare. Resected and retrieved.  -3 medium polyps in the proximal transverse colon, in the mid transverse colon and in the distal transverse colon, removed and resected and retrieved.  -likely malignant partially obstructing tumor at the hepatic flexure. biopsied, tattooed.  -1 large polyp in the mid ascending colon  -the examination was otherwise normal     04/09/2019 Initial Biopsy   FINAL MICROSCOPIC DIAGNOSIS: 04/09/19 1. LG intestine-hepatic flexure, Biopsy:   INVASIVE WELL DIFFERENTIATED ADENOCARCINOMA   04/09/2019 Cancer Staging   Staging form: Colon and Rectum, AJCC 8th Edition - Clinical stage from 04/09/2019: Stage IVC (cTX, cNX, pM1c) - Signed by Truitt Merle, MD on 05/03/2019   04/10/2019 Imaging   CT AP 04/10/19  IMPRESSION: 1. There is an eccentric mass of the colon involving the ascending colon near the hepatic flexure measuring approximately 3.4 x 3.4 by 2.0 cm (series 2, image 37, series 3, image 37). There is extensive soft tissue nodularity of the mesocolon and omentum, and likely areas of the  peritoneum, for example bilateral upper quadrants (series 2, image 25). Findings are consistent with primary colon malignancy, probable omental and peritoneal involvement, and small volume malignant ascites. 2.  Other chronic and incidental findings as detailed above.   04/20/2019 Initial Diagnosis   Cancer of right colon ([place])   04/26/2019 Imaging   CT Chest 04/26/19 IMPRESSION: 1. Borderline to mild lower thoracic adenopathy, including within the right internal mammary and juxta cardiophrenic stations. Given the appearance of the upper abdomen, suspicious for nodal metastasis. 2. A low right paratracheal node is borderline sized, but favored to be reactive. 3. No evidence of pulmonary metastasis. 4. Peritoneal metastasis and abdominal ascites, as before. 5. Pancreatic parenchymal calcifications indicative of chronic calcific pancreatitis. 6. Coronary artery atherosclerosis.   04/27/2019 Pathology Results   Diagnosis 04/27/19 Peritoneum, biopsy, right upper quadrant, perihepatic - ADENOCARCINOMA, CONSISTENT WITH COLONIC PRIMARY. - SEE COMMENT.   05/16/2019 - 10/31/2019 Chemotherapy   First line FOLFOX every 2 weeks with Avastin starting with cycle 2 starting 05/16/19. 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 Stopped after 10/31/19 for surgery.    07/23/2019 Imaging    CT AP W Contrast  IMPRESSION: 1. Primary ascending colon mass may be minimally smaller. Peritoneal metastatic disease appears slightly improved as well. 2. Chronic calcific pancreatitis. 3. Tiny left renal stone. 4. Small left inguinal hernia contains a knuckle of unobstructed colon. 5. Enlarged prostate.   10/29/2019 Imaging   CT AP W Contrast IMPRESSION: No significant change in small ascending colon soft tissue  mass and diffuse omental  carcinomatosis.   No new or progressive metastatic disease identified.   Stable small left inguinal hernia containing a loop of sigmoid colon. No evidence of bowel obstruction or strangulation.   Colonic diverticulosis. No radiographic evidence of diverticulitis.   Stable enlarged prostate.   12/17/2019 Pathology Results   HIPEC Surgery by Dr Clovis Riley 12/17/19 FINAL PATHOLOGIC DIAGNOSIS  MICROSCOPIC EXAMINATION AND DIAGNOSIS  A.          "RIGHT COLON, OMENTUM, SPLEEN":       Invasive adenocarcinoma with mucinous features, moderately differentiated.  Tumor involves mesentery and spleen.  Five out of ten lymph nodes involved by adenocarcinoma with perinodal soft tissue involvement (5/10), see comment.  Margins are uninvolved by invasive carcinoma.  Ileum and appendix, uninvolved.  See comment and cancer case summary.  B.          "ROUND LIGAMENT OF LIVER":       Involved by adenocarcinoma with mucinous features, moderately differentiated.  C.          "RIGHT DIAPHRAM STRIPPING":       Negative for carcinoma.  D.          "RIGHT HEPATIC CAPSULECTOMY":       Chronic inflammation and fibrosis.  Negative for carcinoma.  E.          "DEBRIDED TUMOR":       Involved by adenocarcinoma with mucinous features, moderately differentiated.   COMMENT: Sections disclose five out of ten lymph nodes involved by adenocarcinoma with perinodal soft tissue involvement. However, it is not possible to be certain if this represents lymph nodes replaced by tumor and/or soft tissue involvement. In addition, focal perineural invasion is seen.   04/03/2020 Imaging   CT AP W contrast  IMPRESSION: 1. Status post interval splenectomy and right hemicolectomy. 2. Response to therapy of omental/peritoneal metastasis. The only residual indeterminate finding is fluid density along the capsule of the hepatic dome which could be postoperative or secondary to localized residual peritoneal disease. 3. Left  nephrolithiasis. 4.  Possible constipation. 5. Prostatomegaly. 6.  Aortic Atherosclerosis (ICD10-I70.0).   07/11/2020 Imaging   CT AP  1.  Postsurgical changes of subtle reductive surgery including right hemicolectomy, omentectomy, splenectomy, and right hepatic capsulectomy.  2.  Interval development of multiple areas of low attenuation involving the liver, as described above. This the intraparenchymal low-density lesion is concerning for metastatic disease. Other disease along the capsule may be post therapy related. Residual thickening of the anterior peritoneum particularly anterior to the right lobe of the liver is also concerning for some residual disease although appears significantly improved. Recommend attention on follow-up.  3.  No definite evidence of residual peritoneal disease status post HIPEC.   09/18/2020 -  Chemotherapy   Second line chemo FOLFIRI and bevacizumab q2weeks starting 09/18/20      CURRENT THERAPY:  Second line chemo FOLFIRI andbevacizumab q2weeks starting 09/18/20  INTERVAL HISTORY:  Nathaniel Norton is here for a follow up. She presents to the clinic alone. He notes after C1 he had taste change and felt down and had decreased activity. He notes he felt better after the first 7-8 days. His diarrhea is controlled on Lomotil 4 times a day. He notes he received his flu shot last week.     REVIEW OF SYSTEMS:   Constitutional: Denies fevers, chills or abnormal weight loss Eyes: Denies blurriness of vision Ears, nose, mouth, throat, and face: Denies mucositis or sore throat Respiratory: Denies cough, dyspnea  or wheezes Cardiovascular: Denies palpitation, chest discomfort or lower extremity swelling Gastrointestinal:  Denies nausea, heartburn (+) Diarrhea controlled  Skin: Denies abnormal skin rashes Lymphatics: Denies new lymphadenopathy or easy bruising Neurological:Denies numbness, tingling or new weaknesses Behavioral/Psych: Mood is stable, no new changes    All other systems were reviewed with the patient and are negative.  MEDICAL HISTORY:  Past Medical History:  Diagnosis Date  . colon ca dx'd 03/2019    SURGICAL HISTORY: Past Surgical History:  Procedure Laterality Date  . CATARACT EXTRACTION    . IR IMAGING GUIDED PORT INSERTION  05/10/2019  . L4 fracture  2012  . RETINAL DETACHMENT SURGERY    . Right hand surgery  1974    I have reviewed the social history and family history with the patient and they are unchanged from previous note.  ALLERGIES:  is allergic to feraheme [ferumoxytol] and codeine.  MEDICATIONS:  Current Outpatient Medications  Medication Sig Dispense Refill  . cyclobenzaprine (FLEXERIL) 5 MG tablet TAKE 1 TABLET BY MOUTH 3 TIMES DAILY AS NEEDED FOR UP TO 5 DAYS FOR MUSCLE SPASMS (MUSCULAR PAIN).    Marland Kitchen diphenoxylate-atropine (LOMOTIL) 2.5-0.025 MG tablet Take 1-2 tablets by mouth 4 (four) times daily as needed for diarrhea or loose stools. 90 tablet 1  . ferrous sulfate 325 (65 FE) MG tablet Take 325 mg by mouth daily with breakfast.    . fluticasone (FLONASE) 50 MCG/ACT nasal spray Place into the nose.    . mirtazapine (REMERON) 7.5 MG tablet Take 1 tablet (7.5 mg total) by mouth at bedtime. 30 tablet 1  . Multiple Vitamin (MULTIVITAMIN) tablet Take 1 tablet by mouth daily.     No current facility-administered medications for this visit.    PHYSICAL EXAMINATION: ECOG PERFORMANCE STATUS: 1 - Symptomatic but completely ambulatory  Vitals:   10/01/20 0822  BP: 128/72  Pulse: 60  Resp: 18  Temp: 98.2 F (36.8 C)  SpO2: 100%   Filed Weights   10/01/20 0822  Weight: 149 lb 12.8 oz (67.9 kg)    Due to COVID19 we will limit examination to appearance. Patient had no complaints.  GENERAL:alert, no distress and comfortable SKIN: skin color normal, no rashes or significant lesions EYES: normal, Conjunctiva are pink and non-injected, sclera clear  NEURO: alert & oriented x 3 with fluent  speech   LABORATORY DATA:  I have reviewed the data as listed CBC Latest Ref Rng & Units 10/01/2020 09/18/2020 09/05/2020  WBC 4.0 - 10.5 K/uL 4.5 7.0 6.8  Hemoglobin 13.0 - 17.0 g/dL 12.4(L) 13.3 13.6  Hematocrit 39 - 52 % 37.0(L) 40.2 41.3  Platelets 150 - 400 K/uL 208 275 237     CMP Latest Ref Rng & Units 10/01/2020 09/18/2020 09/05/2020  Glucose 70 - 99 mg/dL 91 100(H) 92  BUN 8 - 23 mg/dL '14 13 10  ' Creatinine 0.61 - 1.24 mg/dL 0.81 0.82 0.78  Sodium 135 - 145 mmol/L 139 139 139  Potassium 3.5 - 5.1 mmol/L 4.2 4.1 3.9  Chloride 98 - 111 mmol/L 105 103 104  CO2 22 - 32 mmol/L '29 31 30  ' Calcium 8.9 - 10.3 mg/dL 9.3 9.7 9.4  Total Protein 6.5 - 8.1 g/dL 6.7 7.1 7.0  Total Bilirubin 0.3 - 1.2 mg/dL 0.7 1.6(H) 1.5(H)  Alkaline Phos 38 - 126 U/L 74 81 71  AST 15 - 41 U/L '16 20 20  ' ALT 0 - 44 U/L '15 20 20      ' RADIOGRAPHIC STUDIES: I have  personally reviewed the radiological images as listed and agreed with the findings in the report. No results found.   ASSESSMENT & PLAN:  Rodman Recupero is a 69 y.o. male with    1. Cancer of right colon,with peritoneal and lung metastasis,stage IV,MMR normal, KRAS mutation (+), MSS -He wasdiagnosed in 03/2019. His Colonoscopy biopsy showed adenocarcinoma of right hepatic flexurecolon.His peritoneal biopsy from 04/27/19 showsadenocarcinoma,consistent withmetastasis of his colon cancer. This is now stage IV disease. -He was treated with first-line FOLFOX in June-November 2020 before proceeding with HIPECsurgeryby Dr. Dustin Folks 12/17/19. -Unfortunately he had disease progression on 09/10/20 PET with hypermetabolic pleural (rigt)and peritoneal nodules, which are consistent with metastatic disease.I do not think we need biopsy to confirm given his previously known peritoneal metastasis -I started him on second-line chemo with FOLFIRI and bevacizumab q2weeks on 09/18/20.  -He tolerated C1 moderately well with taste change, fatigue and  decreased activity. He was able to recover about day 8.  -Labs reviewed, CBC and CMP WNL except Hg 12.4. CEA still pending. Overall adequate to proceed with C2 FOLFIRI and Bevacizumab today with increased to full irinotecan dose and remove 5FU bolus. Will watch his diarrhea.  -F/u in 2 weeks.   2.Abdominal Pain, Frequent Loose stools, stool incontinence  -Diarrhea secondary to his cancer recurrence is now controlled on Lomotil 4 times daily. Will monitor on chemo.  H-e has had abdominal pressure since mid July. This has mildly progressed but intermittent in the last week. He can take Tylenol for mild pain.  -Stable.   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 Watt Climes showed internal hemorrhoids, benign polyps, diverticulosis and colon cancer.  -He was treated with oral iron,IV Feraheme on 05/03/19 and 05/16/19(had reaction with 2nd dose). -With cancer recurrence his anemia recurred. Hg 12.4 today, iron panel still pending (10/01/20)  4. Elevated PSA  -PSA was 5.7 in 11/2018 and 4.6 in 01/2019 -He has prostatomegaly as seen onpriorscans. -He notes recent reduced urine output along with reduced fluid intake. Will evaluate on PET or MRI this month.  5.Goal of care discussion  -The patient understands the goal of care is palliative. -he is full code now  6. Mild hyperbilirubinemia -Overall mild and stable, will continue monitoring.   PLAN: -I refilled EMLA cream today -Lab reviewed and adequate to proceed with C2 FOLFIRI and bevacizumab today with full dose irinotecan and remove 5FU bolus  -He will check his 5FU pump stop time with his nurse.  -Lab, flush, f/u and FOLFIRI and bevacizumab in 2, 4, 6 weeks.    No problem-specific Assessment & Plan notes found for this encounter.   Orders Placed This Encounter  Procedures  . Total Protein, Urine dipstick    Standing Status:   Standing    Number of  Occurrences:   25    Standing Expiration Date:   10/01/2021   All questions were answered. The patient knows to call the clinic with any problems, questions or concerns. No barriers to learning was detected. The total time spent in the appointment was 30 minutes.     Truitt Merle, MD 10/01/2020   I, Joslyn Devon, am acting as scribe for Truitt Merle, MD.   I have reviewed the above documentation for accuracy and completeness, and I agree with the above.

## 2020-10-01 ENCOUNTER — Inpatient Hospital Stay: Payer: Medicare Other

## 2020-10-01 ENCOUNTER — Inpatient Hospital Stay (HOSPITAL_BASED_OUTPATIENT_CLINIC_OR_DEPARTMENT_OTHER): Payer: Medicare Other | Admitting: Hematology

## 2020-10-01 ENCOUNTER — Encounter: Payer: Self-pay | Admitting: Hematology

## 2020-10-01 ENCOUNTER — Other Ambulatory Visit: Payer: Self-pay

## 2020-10-01 VITALS — BP 128/72 | HR 60 | Temp 98.2°F | Resp 18 | Ht 66.0 in | Wt 149.8 lb

## 2020-10-01 DIAGNOSIS — C182 Malignant neoplasm of ascending colon: Secondary | ICD-10-CM | POA: Diagnosis not present

## 2020-10-01 DIAGNOSIS — D500 Iron deficiency anemia secondary to blood loss (chronic): Secondary | ICD-10-CM

## 2020-10-01 DIAGNOSIS — Z5112 Encounter for antineoplastic immunotherapy: Secondary | ICD-10-CM | POA: Diagnosis not present

## 2020-10-01 DIAGNOSIS — R809 Proteinuria, unspecified: Secondary | ICD-10-CM | POA: Diagnosis not present

## 2020-10-01 DIAGNOSIS — Z95828 Presence of other vascular implants and grafts: Secondary | ICD-10-CM

## 2020-10-01 LAB — CBC WITH DIFFERENTIAL (CANCER CENTER ONLY)
Abs Immature Granulocytes: 0.01 10*3/uL (ref 0.00–0.07)
Basophils Absolute: 0 10*3/uL (ref 0.0–0.1)
Basophils Relative: 0 %
Eosinophils Absolute: 0.1 10*3/uL (ref 0.0–0.5)
Eosinophils Relative: 2 %
HCT: 37 % — ABNORMAL LOW (ref 39.0–52.0)
Hemoglobin: 12.4 g/dL — ABNORMAL LOW (ref 13.0–17.0)
Immature Granulocytes: 0 %
Lymphocytes Relative: 38 %
Lymphs Abs: 1.7 10*3/uL (ref 0.7–4.0)
MCH: 29.7 pg (ref 26.0–34.0)
MCHC: 33.5 g/dL (ref 30.0–36.0)
MCV: 88.5 fL (ref 80.0–100.0)
Monocytes Absolute: 0.4 10*3/uL (ref 0.1–1.0)
Monocytes Relative: 9 %
Neutro Abs: 2.2 10*3/uL (ref 1.7–7.7)
Neutrophils Relative %: 51 %
Platelet Count: 208 10*3/uL (ref 150–400)
RBC: 4.18 MIL/uL — ABNORMAL LOW (ref 4.22–5.81)
RDW: 14.2 % (ref 11.5–15.5)
WBC Count: 4.5 10*3/uL (ref 4.0–10.5)
nRBC: 0 % (ref 0.0–0.2)

## 2020-10-01 LAB — CMP (CANCER CENTER ONLY)
ALT: 15 U/L (ref 0–44)
AST: 16 U/L (ref 15–41)
Albumin: 3.5 g/dL (ref 3.5–5.0)
Alkaline Phosphatase: 74 U/L (ref 38–126)
Anion gap: 5 (ref 5–15)
BUN: 14 mg/dL (ref 8–23)
CO2: 29 mmol/L (ref 22–32)
Calcium: 9.3 mg/dL (ref 8.9–10.3)
Chloride: 105 mmol/L (ref 98–111)
Creatinine: 0.81 mg/dL (ref 0.61–1.24)
GFR, Estimated: >60 mL/min (ref >60)
Glucose, Bld: 91 mg/dL (ref 70–99)
Potassium: 4.2 mmol/L (ref 3.5–5.1)
Sodium: 139 mmol/L (ref 135–145)
Total Bilirubin: 0.7 mg/dL (ref 0.3–1.2)
Total Protein: 6.7 g/dL (ref 6.5–8.1)

## 2020-10-01 LAB — IRON AND TIBC
Iron: 54 ug/dL (ref 42–163)
Saturation Ratios: 20 % (ref 20–55)
TIBC: 276 ug/dL (ref 202–409)
UIBC: 222 ug/dL (ref 117–376)

## 2020-10-01 LAB — CEA (IN HOUSE-CHCC): CEA (CHCC-In House): 3.38 ng/mL (ref 0.00–5.00)

## 2020-10-01 LAB — FERRITIN: Ferritin: 175 ng/mL (ref 24–336)

## 2020-10-01 LAB — TOTAL PROTEIN, URINE DIPSTICK: Protein, ur: NEGATIVE mg/dL

## 2020-10-01 MED ORDER — PALONOSETRON HCL INJECTION 0.25 MG/5ML
0.2500 mg | Freq: Once | INTRAVENOUS | Status: AC
  Administered 2020-10-01: 0.25 mg via INTRAVENOUS

## 2020-10-01 MED ORDER — PALONOSETRON HCL INJECTION 0.25 MG/5ML
INTRAVENOUS | Status: AC
  Filled 2020-10-01: qty 5

## 2020-10-01 MED ORDER — SODIUM CHLORIDE 0.9 % IV SOLN
2400.0000 mg/m2 | INTRAVENOUS | Status: DC
  Administered 2020-10-01: 4300 mg via INTRAVENOUS
  Filled 2020-10-01: qty 86

## 2020-10-01 MED ORDER — SODIUM CHLORIDE 0.9 % IV SOLN
Freq: Once | INTRAVENOUS | Status: AC
  Filled 2020-10-01: qty 250

## 2020-10-01 MED ORDER — HEPARIN SOD (PORK) LOCK FLUSH 100 UNIT/ML IV SOLN
500.0000 [IU] | Freq: Once | INTRAVENOUS | Status: DC | PRN
  Filled 2020-10-01: qty 5

## 2020-10-01 MED ORDER — ATROPINE SULFATE 1 MG/ML IJ SOLN
0.5000 mg | Freq: Once | INTRAMUSCULAR | Status: AC | PRN
  Administered 2020-10-01: 0.5 mg via INTRAVENOUS

## 2020-10-01 MED ORDER — SODIUM CHLORIDE 0.9 % IV SOLN
400.0000 mg/m2 | Freq: Once | INTRAVENOUS | Status: AC
  Administered 2020-10-01: 720 mg via INTRAVENOUS
  Filled 2020-10-01: qty 36

## 2020-10-01 MED ORDER — SODIUM CHLORIDE 0.9 % IV SOLN
10.0000 mg | Freq: Once | INTRAVENOUS | Status: AC
  Administered 2020-10-01: 10 mg via INTRAVENOUS
  Filled 2020-10-01: qty 10

## 2020-10-01 MED ORDER — SODIUM CHLORIDE 0.9 % IV SOLN
5.0000 mg/kg | Freq: Once | INTRAVENOUS | Status: AC
  Administered 2020-10-01: 350 mg via INTRAVENOUS
  Filled 2020-10-01: qty 14

## 2020-10-01 MED ORDER — SODIUM CHLORIDE 0.9% FLUSH
10.0000 mL | INTRAVENOUS | Status: DC | PRN
  Administered 2020-10-01: 10 mL
  Filled 2020-10-01: qty 10

## 2020-10-01 MED ORDER — ATROPINE SULFATE 1 MG/ML IJ SOLN
INTRAMUSCULAR | Status: AC
  Filled 2020-10-01: qty 1

## 2020-10-01 MED ORDER — SODIUM CHLORIDE 0.9 % IV SOLN
180.0000 mg/m2 | Freq: Once | INTRAVENOUS | Status: AC
  Administered 2020-10-01: 320 mg via INTRAVENOUS
  Filled 2020-10-01: qty 15

## 2020-10-01 NOTE — Progress Notes (Signed)
Per Dr. Burr Medico, [facility] to treat without urine protein today.

## 2020-10-01 NOTE — Patient Instructions (Signed)
[place] Discharge Instructions for Patients Receiving Chemotherapy  Today you received the following chemotherapy agents: Avastin, Irinotecan, Leucovorin, and Fluorouracil  To help prevent nausea and vomiting after your treatment, we encourage you to take your nausea medication  as prescribed.    If you develop nausea and vomiting that is not controlled by your nausea medication, call the clinic.   BELOW ARE SYMPTOMS THAT SHOULD BE REPORTED IMMEDIATELY:  *FEVER GREATER THAN 100.5 F  *CHILLS WITH OR WITHOUT FEVER  NAUSEA AND VOMITING THAT IS NOT CONTROLLED WITH YOUR NAUSEA MEDICATION  *UNUSUAL SHORTNESS OF BREATH  *UNUSUAL BRUISING OR BLEEDING  TENDERNESS IN MOUTH AND THROAT WITH OR WITHOUT PRESENCE OF ULCERS  *URINARY PROBLEMS  *BOWEL PROBLEMS  UNUSUAL RASH Items with * indicate a potential emergency and should be followed up as soon as possible.  Feel free to call the clinic should you have any questions or concerns. The clinic phone number is [phone].  Please show the [place] at check-in to the Emergency Department and triage nurse.

## 2020-10-02 ENCOUNTER — Telehealth: Payer: Self-pay | Admitting: Hematology

## 2020-10-02 NOTE — Telephone Encounter (Signed)
Scheduled appointments per 10/20 los. Will have updated calendar printed for patient at next visit.

## 2020-10-03 ENCOUNTER — Other Ambulatory Visit: Payer: Self-pay

## 2020-10-03 ENCOUNTER — Inpatient Hospital Stay: Payer: Medicare Other

## 2020-10-03 VITALS — BP 118/76 | HR 69 | Resp 18

## 2020-10-03 DIAGNOSIS — Z5112 Encounter for antineoplastic immunotherapy: Secondary | ICD-10-CM | POA: Diagnosis not present

## 2020-10-03 DIAGNOSIS — C182 Malignant neoplasm of ascending colon: Secondary | ICD-10-CM

## 2020-10-03 MED ORDER — HEPARIN SOD (PORK) LOCK FLUSH 100 UNIT/ML IV SOLN
500.0000 [IU] | Freq: Once | INTRAVENOUS | Status: AC | PRN
  Administered 2020-10-03: 500 [IU]
  Filled 2020-10-03: qty 5

## 2020-10-03 MED ORDER — SODIUM CHLORIDE 0.9% FLUSH
10.0000 mL | INTRAVENOUS | Status: DC | PRN
  Administered 2020-10-03: 10 mL
  Filled 2020-10-03: qty 10

## 2020-10-03 NOTE — Patient Instructions (Signed)

## 2020-10-09 ENCOUNTER — Other Ambulatory Visit: Payer: Medicare Other

## 2020-10-09 ENCOUNTER — Ambulatory Visit: Payer: Medicare Other | Admitting: Hematology

## 2020-10-13 NOTE — Progress Notes (Signed)
[place]   [phone] [phone]   Clinic Follow up Note   Patient Care Team: Lajean Manes, MD as PCP - General (Internal Medicine) Berle Mull, MD as Consulting Physician (Family Medicine) Clarene Essex, MD as Consulting Physician (Gastroenterology)  Date of Service:  10/15/2020  CHIEF COMPLAINT: F/u ofmetastaticcolon cancer  SUMMARY OF ONCOLOGIC HISTORY: Oncology History Overview Note  Cancer Staging Cancer of right colon [facility] Staging form: Colon and Rectum, AJCC 8th Edition - Clinical stage from 04/09/2019: Stage IVC (cTX, cNX, pM1c) - Signed by Truitt Merle, MD on 05/03/2019     Cancer of right colon [facility])  04/09/2019 Procedure   Colonoscopy 04/09/19 by Dr Watt Climes IMPRESSION -internal hemorrhoids -Diverticulosis in the sigmoid colon  -2 small polyps in the rectum and in the proximal transverse colon, removed with a hot snare. Resected and retrieved.  -3 medium polyps in the proximal transverse colon, in the mid transverse colon and in the distal transverse colon, removed and resected and retrieved.  -likely malignant partially obstructing tumor at the hepatic flexure. biopsied, tattooed.  -1 large polyp in the mid ascending colon  -the examination was otherwise normal     04/09/2019 Initial Biopsy   FINAL MICROSCOPIC DIAGNOSIS: 04/09/19 1. LG intestine-hepatic flexure, Biopsy:   INVASIVE WELL DIFFERENTIATED ADENOCARCINOMA   04/09/2019 Cancer Staging   Staging form: Colon and Rectum, AJCC 8th Edition - Clinical stage from 04/09/2019: Stage IVC (cTX, cNX, pM1c) - Signed by Truitt Merle, MD on 05/03/2019   04/10/2019 Imaging   CT AP 04/10/19  IMPRESSION: 1. There is an eccentric mass of the colon involving the ascending colon near the hepatic flexure measuring approximately 3.4 x 3.4 by 2.0 cm (series 2, image 37, series 3, image 37). There is extensive soft tissue nodularity of the mesocolon and omentum, and likely areas of the  peritoneum, for example bilateral upper quadrants (series 2, image 25). Findings are consistent with primary colon malignancy, probable omental and peritoneal involvement, and small volume malignant ascites. 2.  Other chronic and incidental findings as detailed above.   04/20/2019 Initial Diagnosis   Cancer of right colon ([place])   04/26/2019 Imaging   CT Chest 04/26/19 IMPRESSION: 1. Borderline to mild lower thoracic adenopathy, including within the right internal mammary and juxta cardiophrenic stations. Given the appearance of the upper abdomen, suspicious for nodal metastasis. 2. A low right paratracheal node is borderline sized, but favored to be reactive. 3. No evidence of pulmonary metastasis. 4. Peritoneal metastasis and abdominal ascites, as before. 5. Pancreatic parenchymal calcifications indicative of chronic calcific pancreatitis. 6. Coronary artery atherosclerosis.   04/27/2019 Pathology Results   Diagnosis 04/27/19 Peritoneum, biopsy, right upper quadrant, perihepatic - ADENOCARCINOMA, CONSISTENT WITH COLONIC PRIMARY. - SEE COMMENT.   05/16/2019 - 10/31/2019 Chemotherapy   First line FOLFOX every 2 weeks with Avastin starting with cycle 2 starting 05/16/19. 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 Stopped after 10/31/19 for surgery.    07/23/2019 Imaging    CT AP W Contrast  IMPRESSION: 1. Primary ascending colon mass may be minimally smaller. Peritoneal metastatic disease appears slightly improved as well. 2. Chronic calcific pancreatitis. 3. Tiny left renal stone. 4. Small left inguinal hernia contains a knuckle of unobstructed colon. 5. Enlarged prostate.   10/29/2019 Imaging   CT AP W Contrast IMPRESSION: No significant change in small ascending colon soft tissue  mass and diffuse omental  carcinomatosis.   No new or progressive metastatic disease identified.   Stable small left inguinal hernia containing a loop of sigmoid colon. No evidence of bowel obstruction or strangulation.   Colonic diverticulosis. No radiographic evidence of diverticulitis.   Stable enlarged prostate.   12/17/2019 Pathology Results   HIPEC Surgery by Dr Clovis Riley 12/17/19 FINAL PATHOLOGIC DIAGNOSIS  MICROSCOPIC EXAMINATION AND DIAGNOSIS  A.          "RIGHT COLON, OMENTUM, SPLEEN":       Invasive adenocarcinoma with mucinous features, moderately differentiated.  Tumor involves mesentery and spleen.  Five out of ten lymph nodes involved by adenocarcinoma with perinodal soft tissue involvement (5/10), see comment.  Margins are uninvolved by invasive carcinoma.  Ileum and appendix, uninvolved.  See comment and cancer case summary.  B.          "ROUND LIGAMENT OF LIVER":       Involved by adenocarcinoma with mucinous features, moderately differentiated.  C.          "RIGHT DIAPHRAM STRIPPING":       Negative for carcinoma.  D.          "RIGHT HEPATIC CAPSULECTOMY":       Chronic inflammation and fibrosis.  Negative for carcinoma.  E.          "DEBRIDED TUMOR":       Involved by adenocarcinoma with mucinous features, moderately differentiated.   COMMENT: Sections disclose five out of ten lymph nodes involved by adenocarcinoma with perinodal soft tissue involvement. However, it is not possible to be certain if this represents lymph nodes replaced by tumor and/or soft tissue involvement. In addition, focal perineural invasion is seen.   04/03/2020 Imaging   CT AP W contrast  IMPRESSION: 1. Status post interval splenectomy and right hemicolectomy. 2. Response to therapy of omental/peritoneal metastasis. The only residual indeterminate finding is fluid density along the capsule of the hepatic dome which could be postoperative or secondary to localized residual peritoneal disease. 3. Left  nephrolithiasis. 4.  Possible constipation. 5. Prostatomegaly. 6.  Aortic Atherosclerosis (ICD10-I70.0).   07/11/2020 Imaging   CT AP  1.  Postsurgical changes of subtle reductive surgery including right hemicolectomy, omentectomy, splenectomy, and right hepatic capsulectomy.  2.  Interval development of multiple areas of low attenuation involving the liver, as described above. This the intraparenchymal low-density lesion is concerning for metastatic disease. Other disease along the capsule may be post therapy related. Residual thickening of the anterior peritoneum particularly anterior to the right lobe of the liver is also concerning for some residual disease although appears significantly improved. Recommend attention on follow-up.  3.  No definite evidence of residual peritoneal disease status post HIPEC.   09/18/2020 -  Chemotherapy   Second line chemo FOLFIRI and bevacizumab q2weeks starting 09/18/20      CURRENT THERAPY:  Second line chemo FOLFIRI andbevacizumabq2weeks starting 09/18/20  INTERVAL HISTORY:  Saabir Blyth is here for a follow up and treatment. He presents to the clinic alone.  He tolerated the second cycle chemo poorly, lost 15 lbs in past two weeks. He has been taking lomotil 4 tabs a day, diarrhea was initially controlled, but he developed severe abdominal pain and large watery BM several times a day last week, so he stopped lomitil, and diarrhea gradually resolved. He still has mild intermittent abdominal pain, especially before BM. He has not used imodium. His appetite is moderate,   All other systems were reviewed with the  patient and are negative.  MEDICAL HISTORY:  Past Medical History:  Diagnosis Date  . colon ca dx'd 03/2019    SURGICAL HISTORY: Past Surgical History:  Procedure Laterality Date  . CATARACT EXTRACTION    . IR IMAGING GUIDED PORT INSERTION  05/10/2019  . L4 fracture  2012  . RETINAL DETACHMENT SURGERY    . Right hand surgery  1974      I have reviewed the social history and family history with the patient and they are unchanged from previous note.  ALLERGIES:  is allergic to feraheme [ferumoxytol] and codeine.  MEDICATIONS:  Current Outpatient Medications  Medication Sig Dispense Refill  . cyclobenzaprine (FLEXERIL) 5 MG tablet TAKE 1 TABLET BY MOUTH 3 TIMES DAILY AS NEEDED FOR UP TO 5 DAYS FOR MUSCLE SPASMS (MUSCULAR PAIN).    Marland Kitchen diphenoxylate-atropine (LOMOTIL) 2.5-0.025 MG tablet Take 1-2 tablets by mouth 4 (four) times daily as needed for diarrhea or loose stools. 90 tablet 1  . ferrous sulfate 325 (65 FE) MG tablet Take 325 mg by mouth daily with breakfast.    . fluticasone (FLONASE) 50 MCG/ACT nasal spray Place into the nose.    . mirtazapine (REMERON) 7.5 MG tablet Take 1 tablet (7.5 mg total) by mouth at bedtime. 30 tablet 1  . Multiple Vitamin (MULTIVITAMIN) tablet Take 1 tablet by mouth daily.     No current facility-administered medications for this visit.    PHYSICAL EXAMINATION: ECOG PERFORMANCE STATUS: 2 - Symptomatic, <50% confined to bed  Vitals:   10/15/20 0817  BP: 108/70  Pulse: 88  Resp: 20  Temp: 98.1 F (36.7 C)  SpO2: 100%   Filed Weights   10/15/20 0817  Weight: 135 lb 9.6 oz (61.5 kg)    GENERAL:alert, no distress and comfortable SKIN: skin color, texture, turgor are normal, no rashes or significant lesions EYES: normal, Conjunctiva are pink and non-injected, sclera clear Musculoskeletal:no cyanosis of digits and no clubbing, no leg edema  NEURO: alert & oriented x 3 with fluent speech, no focal motor/sensory deficits  LABORATORY DATA:  I have reviewed the data as listed CBC Latest Ref Rng & Units 10/15/2020 10/01/2020 09/18/2020  WBC 4.0 - 10.5 K/uL 9.8 4.5 7.0  Hemoglobin 13.0 - 17.0 g/dL 12.6(L) 12.4(L) 13.3  Hematocrit 39 - 52 % 36.0(L) 37.0(L) 40.2  Platelets 150 - 400 K/uL 400 208 275     CMP Latest Ref Rng & Units 10/01/2020 09/18/2020 09/05/2020  Glucose 70 - 99  mg/dL 91 100(H) 92  BUN 8 - 23 mg/dL _0 Creatinine 0.61 - 1.24 mg/dL 0.81 0.82 0.78  Sodium 135 - 145 mmol/L 139 139 139  Potassium 3.5 - 5.1 mmol/L 4.2 4.1 3.9  Chloride 98 - 111 mmol/L 105 103 104  CO2 22 - 32 mmol/L _1 Calcium 8.9 - 10.3 mg/dL 9.3 9.7 9.4  Total Protein 6.5 - 8.1 g/dL 6.7 7.1 7.0  Total Bilirubin 0.3 - 1.2 mg/dL 0.7 1.6(H) 1.5(H)  Alkaline Phos 38 - 126 U/L 74 81 71  AST 15 - 41 U/L _2 ALT 0 - 44 U/L _3 RADIOGRAPHIC STUDIES: I have personally reviewed the radiological images as listed and agreed with the findings in the report. No results found.   ASSESSMENT & PLAN:  Nathaniel Norton is a 69 y.o. male with    1. Cancer of right colon,with peritonealand lungmetastasis,stage IV,MMR normal, KRAS mutation (+), MSS -He wasdiagnosed  in 03/2019. His Colonoscopy biopsy showed adenocarcinoma of right hepatic flexurecolon.His peritoneal biopsy from 04/27/19 showsadenocarcinoma,consistent withmetastasis of his colon cancer. This is now stage IV disease. -He was treated with first-line FOLFOX in June-November 2020 before proceeding with HIPECsurgeryby Dr. Dustin Folks 12/17/19. -Unfortunately he had disease progression on 09/10/20 PET withhypermetabolic pleural (rigt)and peritoneal nodules, which are consistent with metastatic disease.I do not think we need biopsy to confirm given his previously known peritoneal metastasis -I started him on second-line chemo withFOLFIRI and bevacizumab q2weeks on 09/18/20.  -He unfortunately did not tolerated chemotherapy well, had a severe diarrhea and 15 pounds weight loss.  I will hold chemotherapy today -We discussed management of diarrhea and other symptoms.  I will make a urgent referral to dietitian, and encouraged him to take high-protein and high-calorie food. -Return to clinic in 2 weeks for cycle 3 chemotherapy, I reduced irinotecan dose significantly to 150m/m2 to see if he can tolerate     2.Abdominal Pain, Frequent Loose stools, stool incontinence  -Diarrhea secondary to his cancer recurrence is now controlled on Lomotil 4 times daily. Will monitor on chemo. I also encouraged him to take imodium as needed  H-e has had abdominal pressure since mid July. This has mildly progressed but intermittent in the last week. He can take Tylenol for mild pain.  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 MWatt Climesshowed internal hemorrhoids, benign polyps, diverticulosis and colon cancer.  -He was treated with oral iron,IV Feraheme on 05/03/19 and 05/16/19(had reaction with 2nd dose). -With cancer recurrence his anemia recurred. Overall mild   4. Elevated PSA  -PSA was 5.7 in 11/2018 and 4.6 in 01/2019. He has known enlarged prostate.  -His 09/10/20 PET shows kidney stones and cysts and Prostatomegaly, reflecting BPH  5.Goal of care discussion  -The patient understands the goal of care is palliative. -he is full code now  6. Mildhyperbilirubinemia -Overall mild and stable, will continue monitoring.   PLAN: -Due to significant diarrhea and weight loss, will hold chemotherapy today, given normal saline and 239m IV potassium TODAY -Urgent dietitian consult for weight loss -I will call in oral potassium -Return in 2 weeks for cycle 3 chemotherapy    No problem-specific Assessment & Plan notes found for this encounter.   No orders of the defined types were placed in this encounter.  All questions were answered. The patient knows to call the clinic with any problems, questions or concerns. No barriers to learning was detected. The total time spent in the appointment was 30 minutes.     YaTruitt MerleMD 10/15/2020   I, AmJoslyn Devonam acting as scribe for YaTruitt MerleMD.   I have reviewed the above documentation for  accuracy and completeness, and I agree with the above.

## 2020-10-15 ENCOUNTER — Inpatient Hospital Stay: Payer: Medicare Other

## 2020-10-15 ENCOUNTER — Telehealth: Payer: Self-pay

## 2020-10-15 ENCOUNTER — Other Ambulatory Visit: Payer: Self-pay

## 2020-10-15 ENCOUNTER — Encounter: Payer: Self-pay | Admitting: Hematology

## 2020-10-15 ENCOUNTER — Inpatient Hospital Stay: Payer: Medicare Other | Attending: Hematology | Admitting: Hematology

## 2020-10-15 ENCOUNTER — Ambulatory Visit: Payer: Medicare Other

## 2020-10-15 VITALS — BP 108/70 | HR 88 | Temp 98.1°F | Resp 20 | Ht 66.0 in | Wt 135.6 lb

## 2020-10-15 DIAGNOSIS — Z95828 Presence of other vascular implants and grafts: Secondary | ICD-10-CM

## 2020-10-15 DIAGNOSIS — Z9049 Acquired absence of other specified parts of digestive tract: Secondary | ICD-10-CM | POA: Diagnosis not present

## 2020-10-15 DIAGNOSIS — D500 Iron deficiency anemia secondary to blood loss (chronic): Secondary | ICD-10-CM | POA: Insufficient documentation

## 2020-10-15 DIAGNOSIS — R972 Elevated prostate specific antigen [PSA]: Secondary | ICD-10-CM | POA: Insufficient documentation

## 2020-10-15 DIAGNOSIS — Z452 Encounter for adjustment and management of vascular access device: Secondary | ICD-10-CM | POA: Diagnosis not present

## 2020-10-15 DIAGNOSIS — D122 Benign neoplasm of ascending colon: Secondary | ICD-10-CM | POA: Diagnosis not present

## 2020-10-15 DIAGNOSIS — Z885 Allergy status to narcotic agent status: Secondary | ICD-10-CM | POA: Insufficient documentation

## 2020-10-15 DIAGNOSIS — Z79899 Other long term (current) drug therapy: Secondary | ICD-10-CM | POA: Diagnosis not present

## 2020-10-15 DIAGNOSIS — K621 Rectal polyp: Secondary | ICD-10-CM | POA: Insufficient documentation

## 2020-10-15 DIAGNOSIS — Z5111 Encounter for antineoplastic chemotherapy: Secondary | ICD-10-CM | POA: Diagnosis not present

## 2020-10-15 DIAGNOSIS — Z5112 Encounter for antineoplastic immunotherapy: Secondary | ICD-10-CM | POA: Diagnosis present

## 2020-10-15 DIAGNOSIS — R159 Full incontinence of feces: Secondary | ICD-10-CM | POA: Diagnosis not present

## 2020-10-15 DIAGNOSIS — K5790 Diverticulosis of intestine, part unspecified, without perforation or abscess without bleeding: Secondary | ICD-10-CM | POA: Insufficient documentation

## 2020-10-15 DIAGNOSIS — R188 Other ascites: Secondary | ICD-10-CM | POA: Diagnosis not present

## 2020-10-15 DIAGNOSIS — C786 Secondary malignant neoplasm of retroperitoneum and peritoneum: Secondary | ICD-10-CM | POA: Diagnosis present

## 2020-10-15 DIAGNOSIS — Z9081 Acquired absence of spleen: Secondary | ICD-10-CM | POA: Insufficient documentation

## 2020-10-15 DIAGNOSIS — K648 Other hemorrhoids: Secondary | ICD-10-CM | POA: Insufficient documentation

## 2020-10-15 DIAGNOSIS — N400 Enlarged prostate without lower urinary tract symptoms: Secondary | ICD-10-CM | POA: Diagnosis not present

## 2020-10-15 DIAGNOSIS — C182 Malignant neoplasm of ascending colon: Secondary | ICD-10-CM

## 2020-10-15 DIAGNOSIS — R809 Proteinuria, unspecified: Secondary | ICD-10-CM

## 2020-10-15 LAB — CMP (CANCER CENTER ONLY)
ALT: 78 U/L — ABNORMAL HIGH (ref 0–44)
AST: 46 U/L — ABNORMAL HIGH (ref 15–41)
Albumin: 2.9 g/dL — ABNORMAL LOW (ref 3.5–5.0)
Alkaline Phosphatase: 157 U/L — ABNORMAL HIGH (ref 38–126)
Anion gap: 11 (ref 5–15)
BUN: 20 mg/dL (ref 8–23)
CO2: 25 mmol/L (ref 22–32)
Calcium: 8.8 mg/dL — ABNORMAL LOW (ref 8.9–10.3)
Chloride: 98 mmol/L (ref 98–111)
Creatinine: 0.91 mg/dL (ref 0.61–1.24)
GFR, Estimated: >60 mL/min (ref >60)
Glucose, Bld: 110 mg/dL — ABNORMAL HIGH (ref 70–99)
Potassium: 3 mmol/L — ABNORMAL LOW (ref 3.5–5.1)
Sodium: 134 mmol/L — ABNORMAL LOW (ref 135–145)
Total Bilirubin: 1.6 mg/dL — ABNORMAL HIGH (ref 0.3–1.2)
Total Protein: 6.7 g/dL (ref 6.5–8.1)

## 2020-10-15 LAB — CBC WITH DIFFERENTIAL (CANCER CENTER ONLY)
Abs Immature Granulocytes: 0.15 10*3/uL — ABNORMAL HIGH (ref 0.00–0.07)
Basophils Absolute: 0.1 10*3/uL (ref 0.0–0.1)
Basophils Relative: 1 %
Eosinophils Absolute: 0 10*3/uL (ref 0.0–0.5)
Eosinophils Relative: 0 %
HCT: 36 % — ABNORMAL LOW (ref 39.0–52.0)
Hemoglobin: 12.6 g/dL — ABNORMAL LOW (ref 13.0–17.0)
Immature Granulocytes: 2 %
Lymphocytes Relative: 29 %
Lymphs Abs: 2.8 10*3/uL (ref 0.7–4.0)
MCH: 29.2 pg (ref 26.0–34.0)
MCHC: 35 g/dL (ref 30.0–36.0)
MCV: 83.5 fL (ref 80.0–100.0)
Monocytes Absolute: 2.5 10*3/uL — ABNORMAL HIGH (ref 0.1–1.0)
Monocytes Relative: 25 %
Neutro Abs: 4.2 10*3/uL (ref 1.7–7.7)
Neutrophils Relative %: 43 %
Platelet Count: 400 10*3/uL (ref 150–400)
RBC: 4.31 MIL/uL (ref 4.22–5.81)
RDW: 13.7 % (ref 11.5–15.5)
WBC Count: 9.8 10*3/uL (ref 4.0–10.5)
nRBC: 0 % (ref 0.0–0.2)

## 2020-10-15 MED ORDER — HEPARIN SOD (PORK) LOCK FLUSH 100 UNIT/ML IV SOLN
500.0000 [IU] | Freq: Once | INTRAVENOUS | Status: AC | PRN
  Administered 2020-10-15: 500 [IU]
  Filled 2020-10-15: qty 5

## 2020-10-15 MED ORDER — SODIUM CHLORIDE 0.9% FLUSH
10.0000 mL | INTRAVENOUS | Status: DC | PRN
  Administered 2020-10-15: 10 mL
  Filled 2020-10-15: qty 10

## 2020-10-15 MED ORDER — POTASSIUM CHLORIDE CRYS ER 20 MEQ PO TBCR: 20.0000 meq | EXTENDED_RELEASE_TABLET | Freq: Two times a day (BID) | ORAL | 1 refills | Status: DC

## 2020-10-15 MED ORDER — POTASSIUM CHLORIDE IN NACL 20-0.9 MEQ/L-% IV SOLN
INTRAVENOUS | Status: AC
  Filled 2020-10-15: qty 1000

## 2020-10-15 NOTE — Patient Instructions (Signed)
Rehydration, Adult Rehydration is the replacement of body fluids and salts and minerals (electrolytes) that are lost during dehydration. Dehydration is when there is not enough fluid or water in the body. This happens when you lose more fluids than you take in. Common causes of dehydration include:  Vomiting.  Diarrhea.  Excessive sweating, such as from heat exposure or exercise.  Taking medicines that cause the body to lose excess fluid (diuretics).  Impaired kidney function.  Not drinking enough fluid.  Certain illnesses or infections.  Certain poorly controlled long-term (chronic) illnesses, such as diabetes, heart disease, and kidney disease.  Symptoms of mild dehydration may include thirst, dry lips and mouth, dry skin, and dizziness. Symptoms of severe dehydration may include increased heart rate, confusion, fainting, and not urinating. You can rehydrate by drinking certain fluids or getting fluids through an IV tube, as told by your health care provider. What are the risks? Generally, rehydration is safe. However, one problem that can happen is taking in too much fluid (overhydration). This is rare. If overhydration happens, it can cause an electrolyte imbalance, kidney failure, or a decrease in salt (sodium) levels in the body. How to rehydrate Follow instructions from your health care provider for rehydration. The kind of fluid you should drink and the amount you should drink depend on your condition.  If directed by your health care provider, drink an oral rehydration solution (ORS). This is a drink designed to treat dehydration that is found in pharmacies and retail stores. ? Make an ORS by following instructions on the package. ? Start by drinking small amounts, about  cup (120 mL) every 5-10 minutes. ? Slowly increase how much you drink until you have taken the amount recommended by your health care provider.  Drink enough clear fluids to keep your urine clear or pale  yellow. If you were instructed to drink an ORS, finish the ORS first, then start slowly drinking other clear fluids. Drink fluids such as: ? Water. Do not drink only water. Doing that can lead to having too little sodium in your body (hyponatremia). ? Ice chips. ? Fruit juice that you have added water to (diluted juice). ? Low-calorie sports drinks.  If you are severely dehydrated, your health care provider may recommend that you receive fluids through an IV tube in the hospital.  Do not take sodium tablets. Doing that can lead to the condition of having too much sodium in your body (hypernatremia). Eating while you rehydrate Follow instructions from your health care provider about what to eat while you rehydrate. Your health care provider may recommend that you slowly begin eating regular foods in small amounts.  Eat foods that contain a healthy balance of electrolytes, such as bananas, oranges, potatoes, tomatoes, and spinach.  Avoid foods that are greasy or contain a lot of fat or sugar.  In some cases, you may get nutrition through a feeding tube that is passed through your nose and into your stomach (nasogastric tube, or NG tube). This may be done if you have uncontrolled vomiting or diarrhea. Beverages to avoid Certain beverages may make dehydration worse. While you rehydrate, avoid:  Alcohol.  Caffeine.  Drinks that contain a lot of sugar. These include: ? High-calorie sports drinks. ? Fruit juice that is not diluted. ? Soda.  Check nutrition labels to see how much sugar or caffeine a beverage contains. Signs of dehydration recovery You may be recovering from dehydration if:  You are urinating more often than before you started   rehydrating.  Your urine is clear or pale yellow.  Your energy level improves.  You vomit less frequently.  You have diarrhea less frequently.  Your appetite improves or returns to normal.  You feel less dizzy or less light-headed.  Your  skin tone and color start to look more normal. Contact a health care provider if:  You continue to have symptoms of mild dehydration, such as: ? Thirst. ? Dry lips. ? Slightly dry mouth. ? Dry, warm skin. ? Dizziness.  You continue to vomit or have diarrhea. Get help right away if:  You have symptoms of dehydration that get worse.  You feel: ? Confused. ? Weak. ? Like you are going to faint.  You have not urinated in 6-8 hours.  You have very dark urine.  You have trouble breathing.  Your heart rate while sitting still is over 100 beats a minute.  You cannot drink fluids without vomiting.  You have vomiting or diarrhea that: ? Gets worse. ? Does not go away.  You have a fever. This information is not intended to replace advice given to you by your health care provider. Make sure you discuss any questions you have with your health care provider. Document Revised: 11/11/2017 Document Reviewed: 01/23/2016 Elsevier Patient Education  2020 [employer].   Hypokalemia Hypokalemia means that the amount of potassium in the blood is lower than normal. Potassium is a chemical (electrolyte) that helps regulate the amount of fluid in the body. It also stimulates muscle tightening (contraction) and helps nerves work properly. Normally, most of the body's potassium is inside cells, and only a very small amount is in the blood. Because the amount in the blood is so small, minor changes to potassium levels in the blood can be life-threatening. What are the causes? This condition may be caused by:  Antibiotic medicine.  Diarrhea or vomiting. Taking too much of a medicine that helps you have a bowel movement (laxative) can cause diarrhea and lead to hypokalemia.  Chronic kidney disease (CKD).  Medicines that help the body get rid of excess fluid (diuretics).  Eating disorders, such as bulimia.  Low magnesium levels in the body.  Sweating a lot. What are the signs or  symptoms? Symptoms of this condition include:  Weakness.  Constipation.  Fatigue.  Muscle cramps.  Mental confusion.  Skipped heartbeats or irregular heartbeat (palpitations).  Tingling or numbness. How is this diagnosed? This condition is diagnosed with a blood test. How is this treated? This condition may be treated by:  Taking potassium supplements by mouth.  Adjusting the medicines that you take.  Eating more foods that contain a lot of potassium. If your potassium level is very low, you may need to get potassium through an IV and be monitored in the hospital. Follow these instructions at home:   Take over-the-counter and prescription medicines only as told by your health care provider. This includes vitamins and supplements.  Eat a healthy diet. A healthy diet includes fresh fruits and vegetables, whole grains, healthy fats, and lean proteins.  If instructed, eat more foods that contain a lot of potassium. This includes: ? Nuts, such as peanuts and pistachios. ? Seeds, such as sunflower seeds and pumpkin seeds. ? Peas, lentils, and lima beans. ? Whole grain and bran cereals and breads. ? Fresh fruits and vegetables, such as apricots, avocado, bananas, cantaloupe, kiwi, oranges, tomatoes, asparagus, and potatoes. ? Orange juice. ? Tomato juice. ? Red meats. ? Yogurt.  Keep all follow-up visits  as told by your health care provider. This is important. Contact a health care provider if you:  Have weakness that gets worse.  Feel your heart pounding or racing.  Vomit.  Have diarrhea.  Have diabetes (diabetes mellitus) and you have trouble keeping your blood sugar (glucose) in your target range. Get help right away if you:  Have chest pain.  Have shortness of breath.  Have vomiting or diarrhea that lasts for more than 2 days.  Faint. Summary  Hypokalemia means that the amount of potassium in the blood is lower than normal.  This condition is  diagnosed with a blood test.  Hypokalemia may be treated by taking potassium supplements, adjusting the medicines that you take, or eating more foods that are high in potassium.  If your potassium level is very low, you may need to get potassium through an IV and be monitored in the hospital. This information is not intended to replace advice given to you by your health care provider. Make sure you discuss any questions you have with your health care provider. Document Revised: 07/12/2018 Document Reviewed: 07/12/2018 Elsevier Patient Education  [place].

## 2020-10-15 NOTE — Telephone Encounter (Signed)
Ov note from 10/15/2020 faxed to Dr. Ronnette Hila at [phone]

## 2020-10-15 NOTE — Patient Instructions (Signed)

## 2020-10-17 ENCOUNTER — Encounter: Payer: Medicare Other | Admitting: Nutrition

## 2020-10-17 ENCOUNTER — Telehealth: Payer: Self-pay

## 2020-10-17 ENCOUNTER — Telehealth: Payer: Self-pay | Admitting: Nutrition

## 2020-10-17 NOTE — Telephone Encounter (Signed)
I received a message from Dory Peru, she spoke with Mr Nathaniel Norton this am.  He is unsure how to take the imodium a lomotil.  He also c/o abd cramping.  I called him and he did not answer. I left a vm for him to return my call.

## 2020-10-17 NOTE — Telephone Encounter (Signed)
Patient returned telephone call. Reports he is on his way to an appointment at [facility]. This is a 69 year old male diagnosed with metastatic colon cancer and followed by Dr. Burr Medico. Noted weight documented as 135.6 pounds on November 3; this is decreased from 151.23 pounds on October 7. Patient reports he does not care for Ensure or boost. His diet consists mostly of ready to eat foods from cans or frozen meals.  He does not like to cook. Complains of frequent loose stools but has started Imodium.  Report reports Imodium has helped however he did continue to have some liquid stools after taking it one time.  Reports some abdominal cramping after Imodium. Also reports poor appetite.  Nutrition diagnosis: Food and nutrition related knowledge deficit continues.  Intervention: Educated patient on strategies for improving calories and protein in small frequent meals and snacks. Educated patient on strategies for eating to improve diarrhea. Will ask RN to contact patient regarding directions on taking Imodium. Reviewed importance of adding any oral nutrition supplement and provided other options besides Ensure and boost. Questions were answered.  Teach back method used.  Contact information provided.  I mailed fact sheets to patient's home.  Monitoring, evaluation, goals: Patient will tolerate increased calories and protein to minimize further weight loss.  Next visit: Wednesday, November 17 during infusion.  **Disclaimer: This note was dictated with voice recognition software. Similar sounding words can inadvertently be transcribed and this note may contain transcription errors which may not have been corrected upon publication of note.**

## 2020-10-17 NOTE — Telephone Encounter (Signed)
Contacted patient by telephone.  There was no answer.  I left a message with my name and phone number for return call.  I have scheduled patient on Wednesday, November 17 for nutrition follow-up during infusion.

## 2020-10-28 NOTE — Progress Notes (Signed)
[place]   [phone] [phone]   Clinic Follow up Note   Patient Care Team: Lajean Manes, MD as PCP - General (Internal Medicine) Berle Mull, MD as Consulting Physician (Family Medicine) Clarene Essex, MD as Consulting Physician (Gastroenterology) 10/29/2020  CHIEF COMPLAINT: Follow-up metastatic colon cancer  SUMMARY OF ONCOLOGIC HISTORY: Oncology History Overview Note  Cancer Staging Cancer of right colon [facility]) Staging form: Colon and Rectum, AJCC 8th Edition - Clinical stage from 04/09/2019: Stage IVC (cTX, cNX, pM1c) - Signed by Truitt Merle, MD on 05/03/2019     Cancer of right colon [facility])  04/09/2019 Procedure   Colonoscopy 04/09/19 by Dr Watt Climes IMPRESSION -internal hemorrhoids -Diverticulosis in the sigmoid colon  -2 small polyps in the rectum and in the proximal transverse colon, removed with a hot snare. Resected and retrieved.  -3 medium polyps in the proximal transverse colon, in the mid transverse colon and in the distal transverse colon, removed and resected and retrieved.  -likely malignant partially obstructing tumor at the hepatic flexure. biopsied, tattooed.  -1 large polyp in the mid ascending colon  -the examination was otherwise normal     04/09/2019 Initial Biopsy   FINAL MICROSCOPIC DIAGNOSIS: 04/09/19 1. LG intestine-hepatic flexure, Biopsy:   INVASIVE WELL DIFFERENTIATED ADENOCARCINOMA   04/09/2019 Cancer Staging   Staging form: Colon and Rectum, AJCC 8th Edition - Clinical stage from 04/09/2019: Stage IVC (cTX, cNX, pM1c) - Signed by Truitt Merle, MD on 05/03/2019   04/10/2019 Imaging   CT AP 04/10/19  IMPRESSION: 1. There is an eccentric mass of the colon involving the ascending colon near the hepatic flexure measuring approximately 3.4 x 3.4 by 2.0 cm (series 2, image 37, series 3, image 37). There is extensive soft tissue nodularity of the mesocolon and omentum, and likely areas of the peritoneum, for example  bilateral upper quadrants (series 2, image 25). Findings are consistent with primary colon malignancy, probable omental and peritoneal involvement, and small volume malignant ascites. 2.  Other chronic and incidental findings as detailed above.   04/20/2019 Initial Diagnosis   Cancer of right colon ([place])   04/26/2019 Imaging   CT Chest 04/26/19 IMPRESSION: 1. Borderline to mild lower thoracic adenopathy, including within the right internal mammary and juxta cardiophrenic stations. Given the appearance of the upper abdomen, suspicious for nodal metastasis. 2. A low right paratracheal node is borderline sized, but favored to be reactive. 3. No evidence of pulmonary metastasis. 4. Peritoneal metastasis and abdominal ascites, as before. 5. Pancreatic parenchymal calcifications indicative of chronic calcific pancreatitis. 6. Coronary artery atherosclerosis.   04/27/2019 Pathology Results   Diagnosis 04/27/19 Peritoneum, biopsy, right upper quadrant, perihepatic - ADENOCARCINOMA, CONSISTENT WITH COLONIC PRIMARY. - SEE COMMENT.   05/16/2019 - 10/31/2019 Chemotherapy   First line FOLFOX every 2 weeks with Avastin starting with cycle 2 starting 05/16/19. 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 Stopped after 10/31/19 for surgery.    07/23/2019 Imaging    CT AP W Contrast  IMPRESSION: 1. Primary ascending colon mass may be minimally smaller. Peritoneal metastatic disease appears slightly improved as well. 2. Chronic calcific pancreatitis. 3. Tiny left renal stone. 4. Small left inguinal hernia contains a knuckle of unobstructed colon. 5. Enlarged prostate.   10/29/2019 Imaging   CT AP W Contrast IMPRESSION: No significant change in small ascending colon soft tissue mass and diffuse omental  carcinomatosis.   No new or  progressive metastatic disease identified.   Stable small left inguinal hernia containing a loop of sigmoid colon. No evidence of bowel obstruction or strangulation.   Colonic diverticulosis. No radiographic evidence of diverticulitis.   Stable enlarged prostate.   12/17/2019 Pathology Results   HIPEC Surgery by Dr Clovis Riley 12/17/19 FINAL PATHOLOGIC DIAGNOSIS  MICROSCOPIC EXAMINATION AND DIAGNOSIS  A.          "RIGHT COLON, OMENTUM, SPLEEN":       Invasive adenocarcinoma with mucinous features, moderately differentiated.  Tumor involves mesentery and spleen.  Five out of ten lymph nodes involved by adenocarcinoma with perinodal soft tissue involvement (5/10), see comment.  Margins are uninvolved by invasive carcinoma.  Ileum and appendix, uninvolved.  See comment and cancer case summary.  B.          "ROUND LIGAMENT OF LIVER":       Involved by adenocarcinoma with mucinous features, moderately differentiated.  C.          "RIGHT DIAPHRAM STRIPPING":       Negative for carcinoma.  D.          "RIGHT HEPATIC CAPSULECTOMY":       Chronic inflammation and fibrosis.  Negative for carcinoma.  E.          "DEBRIDED TUMOR":       Involved by adenocarcinoma with mucinous features, moderately differentiated.   COMMENT: Sections disclose five out of ten lymph nodes involved by adenocarcinoma with perinodal soft tissue involvement. However, it is not possible to be certain if this represents lymph nodes replaced by tumor and/or soft tissue involvement. In addition, focal perineural invasion is seen.   04/03/2020 Imaging   CT AP W contrast  IMPRESSION: 1. Status post interval splenectomy and right hemicolectomy. 2. Response to therapy of omental/peritoneal metastasis. The only residual indeterminate finding is fluid density along the capsule of the hepatic dome which could be postoperative or secondary to localized residual peritoneal disease. 3. Left nephrolithiasis. 4.   Possible constipation. 5. Prostatomegaly. 6.  Aortic Atherosclerosis (ICD10-I70.0).   07/11/2020 Imaging   CT AP  1.  Postsurgical changes of subtle reductive surgery including right hemicolectomy, omentectomy, splenectomy, and right hepatic capsulectomy.  2.  Interval development of multiple areas of low attenuation involving the liver, as described above. This the intraparenchymal low-density lesion is concerning for metastatic disease. Other disease along the capsule may be post therapy related. Residual thickening of the anterior peritoneum particularly anterior to the right lobe of the liver is also concerning for some residual disease although appears significantly improved. Recommend attention on follow-up.  3.  No definite evidence of residual peritoneal disease status post HIPEC.   09/18/2020 -  Chemotherapy   Second line chemo FOLFIRI and bevacizumab q2weeks starting 09/18/20     CURRENT THERAPY: Second line chemo FOLFIRI andbevacizumabq2weeks starting 09/18/20  INTERVAL HISTORY: Mr. Cutsforth returns for follow-up and treatment as scheduled.  He completed cycle 2 FOLFIRI/Avastin on 10/20.  Cycle 3 on 11/3 was postponed due to poor tolerance, significant diarrhea and weight loss.  Treatment was held and he was given supportive care IVF + K.  He was seen by Dr. Clovis Riley on 11/5 who recommended to continue palliative chemo, he will see him as needed in the future.  Mr. Tyler presents by himself today.  We reviewed his experience with chemo thus far, he had extreme difficulties with cycles 1 and 2.  He did not tolerate Lomotil.  In  the last 2 weeks he has been taking Imodium once after lunch which usually works most of the day.  He had a normal formed stool yesterday.  He is eating more often.  No nausea or vomiting except gagging after taking his potassium pill recently.  Denies mucositis.  He has been more active in the last week, his back pain is hurting him a little more which he  attributes to getting older and possibly lifting some heavy boxes.  Abdominal pain is still there, improved since he stopped taking Lomotil.  He is having some urinary symptoms including nocturia, frequency with little output, and mild dysuria.  He attributes this to his enlarged prostate rather than an infection. Mild exertional dyspnea, otherwise no cough, chest pain, dyspnea.  No fever or chills.  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     MEDICAL HISTORY:  Past Medical History:  Diagnosis Date  . colon ca dx'd 03/2019    SURGICAL HISTORY: Past Surgical History:  Procedure Laterality Date  . CATARACT EXTRACTION    . IR IMAGING GUIDED PORT INSERTION  05/10/2019  . L4 fracture  2012  . RETINAL DETACHMENT SURGERY    . Right hand surgery  1974    I have reviewed the social history and family history with the patient and they are unchanged from previous note.  ALLERGIES:  is allergic to feraheme [ferumoxytol] and codeine.  MEDICATIONS:  Current Outpatient Medications  Medication Sig Dispense Refill  . cyclobenzaprine (FLEXERIL) 5 MG tablet TAKE 1 TABLET BY MOUTH 3 TIMES DAILY AS NEEDED FOR UP TO 5 DAYS FOR MUSCLE SPASMS (MUSCULAR PAIN).    Marland Kitchen diphenoxylate-atropine (LOMOTIL) 2.5-0.025 MG tablet Take 1-2 tablets by mouth 4 (four) times daily as needed for diarrhea or loose stools. 90 tablet 1  . ferrous sulfate 325 (65 FE) MG tablet Take 325 mg by mouth daily with breakfast.    . fluticasone (FLONASE) 50 MCG/ACT nasal spray Place into the nose.    . mirtazapine (REMERON) 7.5 MG tablet Take 1 tablet (7.5 mg total) by mouth at bedtime. 30 tablet 1  . Multiple Vitamin (MULTIVITAMIN) tablet Take 1 tablet by mouth daily.    . potassium chloride SA (KLOR-CON) 20 MEQ tablet Take 1 tablet (20 mEq total) by mouth 2 (two) times daily. 40 tablet 1   No current facility-administered medications for this visit.   Facility-Administered Medications Ordered in Other Visits  Medication Dose Route  Frequency Provider Last Rate Last Admin  . fluorouracil (ADRUCIL) 4,300 mg in sodium chloride 0.9 % 64 mL chemo infusion  2,400 mg/m2 (Treatment Plan Recorded) Intravenous 1 day or 1 dose Truitt Merle, MD      . heparin lock flush 100 unit/mL  500 Units Intracatheter Once PRN Truitt Merle, MD      . irinotecan (CAMPTOSAR) 180 mg in sodium chloride 0.9 % 500 mL chemo infusion  100 mg/m2 (Treatment Plan Recorded) Intravenous Once Truitt Merle, MD 339 mL/hr at 10/29/20 1139 180 mg at 10/29/20 1139  . leucovorin 720 mg in sodium chloride 0.9 % 250 mL infusion  400 mg/m2 (Treatment Plan Recorded) Intravenous Once Truitt Merle, MD 191 mL/hr at 10/29/20 1134 720 mg at 10/29/20 1134  . sodium chloride flush (NS) 0.9 % injection 10 mL  10 mL Intracatheter PRN Truitt Merle, MD        PHYSICAL EXAMINATION: ECOG PERFORMANCE STATUS: 1 - Symptomatic but completely ambulatory  Vitals:   10/29/20 0849  BP: 128/72  Pulse: 91  Resp:  15  Temp: 98.3 F (36.8 C)  SpO2: 100%   Filed Weights   10/29/20 0849  Weight: 134 lb 14.4 oz (61.2 kg)    GENERAL:alert, no distress and comfortable SKIN: No rash EYES: sclera clear OROPHARYNX: No thrush or ulcers LYMPH: Without mass LUNGS: clear with normal breathing effort HEART: regular rate & rhythm, no lower extremity edema ABDOMEN:abdomen soft, non-tender and normal bowel sounds NEURO: alert & oriented x 3 with fluent speech, normal gait PAC without erythema  LABORATORY DATA:  I have reviewed the data as listed CBC Latest Ref Rng & Units 10/29/2020 10/15/2020 10/01/2020  WBC 4.0 - 10.5 K/uL 8.6 9.8 4.5  Hemoglobin 13.0 - 17.0 g/dL 11.8(L) 12.6(L) 12.4(L)  Hematocrit 39 - 52 % 35.2(L) 36.0(L) 37.0(L)  Platelets 150 - 400 K/uL 466(H) 400 208     CMP Latest Ref Rng & Units 10/29/2020 10/15/2020 10/01/2020  Glucose 70 - 99 mg/dL 96 110(H) 91  BUN 8 - 23 mg/dL '15 20 14  ' Creatinine 0.61 - 1.24 mg/dL 0.79 0.91 0.81  Sodium 135 - 145 mmol/L 137 134(L) 139  Potassium 3.5 -  5.1 mmol/L 4.5 3.0(L) 4.2  Chloride 98 - 111 mmol/L 100 98 105  CO2 22 - 32 mmol/L '29 25 29  ' Calcium 8.9 - 10.3 mg/dL 9.0 8.8(L) 9.3  Total Protein 6.5 - 8.1 g/dL 7.0 6.7 6.7  Total Bilirubin 0.3 - 1.2 mg/dL 1.0 1.6(H) 0.7  Alkaline Phos 38 - 126 U/L 126 157(H) 74  AST 15 - 41 U/L 18 46(H) 16  ALT 0 - 44 U/L 19 78(H) 15      RADIOGRAPHIC STUDIES: I have personally reviewed the radiological images as listed and agreed with the findings in the report. No results found.   ASSESSMENT & PLAN: Nathaniel Norton is a 69 y.o. male with    1. 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 His Colonoscopy biopsy showed adenocarcinoma of right hepatic flexurecolon.His peritoneal biopsy from 04/27/19 showsadenocarcinoma,consistent withmetastasis of his colon cancer. This is now stage IV disease. -He was treated with first-line FOLFOX in June-November 2020 before proceeding with HIPECsurgeryby Dr. Dustin Folks 12/17/19. -Unfortunately he had disease progression on 09/10/20 PET withhypermetabolic pleural (rigt)and peritoneal nodules, which are consistent with metastatic disease. A biopsy was not felt to be necessary given his previously known peritoneal metastasis -He started second line FOLFIRI and bevacizumab q2weekson 09/18/20, tolerated first 2 cycles poorly with abdominal pain/cramping and diarrhea.  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  Watt Climes showed internal hemorrhoids, benign polyps, diverticulosis and colon cancer.  -He was treated with oral iron,IV Feraheme on 05/03/19 and 05/16/19(had reaction with 2nd dose). -With cancer recurrence his anemia recurred. Overall mild  -Ferritin 650 on 10/29/2020  4. Elevated PSA  -PSA was 5.7 in 11/2018 and 4.6 in 01/2019. He has known enlarged prostate.  -His 09/10/20 PET shows kidney stones and cysts and Prostatomegaly, reflecting BPH -He recently developed nocturia, mild dysuria, and frequency with low urine output -We will obtain UA to rule out UTI, and  refer to [facility] per patient's request  5.Goal of care discussion  -The patient understands the goal of care is palliative. -he is full code now  6. Mildhyperbilirubinemia -Overall mild and stable, will continue monitoring. -normal today  Disposition: Mr. Zhong appears improved.  Diarrhea resolved with Imodium.  His weight loss stabilized, performance status has improved. He has recovered well.   Will obtain UA for urinary symptoms. He has requested referral to urology which I placed today.   CBC and CMP adequate to proceed with cycle 3 dose reduced FOLFIRI and bevacizumab today.  Continue potassium once daily.  He knows to titrate Imodium as needed.  He will return next week for close monitoring and supportive care.   Orders Placed This Encounter  Procedures  . Urine Culture    Standing Status:   Future    Standing Expiration Date:   10/29/2021  . Urinalysis, Complete w Microscopic    Standing Status:   Future    Standing Expiration Date:   10/29/2021  . Ambulatory referral to Urology    Referral Priority:   Routine    Referral Type:   Consultation    Referral Reason:   Specialty Services Required    Requested Specialty:   Urology    Number of Visits Requested:   1   All questions were answered. The patient knows to call the clinic with any problems, questions or concerns. No barriers to learning  were detected. Total encounter time was 30 minutes.      Alla Feeling, NP 10/29/20

## 2020-10-29 ENCOUNTER — Inpatient Hospital Stay: Payer: Medicare Other | Admitting: Nutrition

## 2020-10-29 ENCOUNTER — Telehealth: Payer: Self-pay | Admitting: Nurse Practitioner

## 2020-10-29 ENCOUNTER — Inpatient Hospital Stay: Payer: Medicare Other

## 2020-10-29 ENCOUNTER — Other Ambulatory Visit: Payer: Self-pay

## 2020-10-29 ENCOUNTER — Encounter: Payer: Self-pay | Admitting: Nurse Practitioner

## 2020-10-29 ENCOUNTER — Inpatient Hospital Stay (HOSPITAL_BASED_OUTPATIENT_CLINIC_OR_DEPARTMENT_OTHER): Payer: Medicare Other | Admitting: Nurse Practitioner

## 2020-10-29 VITALS — BP 128/72 | HR 91 | Temp 98.3°F | Resp 15 | Ht 66.0 in | Wt 134.9 lb

## 2020-10-29 DIAGNOSIS — R809 Proteinuria, unspecified: Secondary | ICD-10-CM

## 2020-10-29 DIAGNOSIS — R300 Dysuria: Secondary | ICD-10-CM | POA: Diagnosis not present

## 2020-10-29 DIAGNOSIS — D500 Iron deficiency anemia secondary to blood loss (chronic): Secondary | ICD-10-CM

## 2020-10-29 DIAGNOSIS — C182 Malignant neoplasm of ascending colon: Secondary | ICD-10-CM

## 2020-10-29 DIAGNOSIS — Z95828 Presence of other vascular implants and grafts: Secondary | ICD-10-CM

## 2020-10-29 DIAGNOSIS — Z5112 Encounter for antineoplastic immunotherapy: Secondary | ICD-10-CM | POA: Diagnosis not present

## 2020-10-29 LAB — CBC WITH DIFFERENTIAL (CANCER CENTER ONLY)
Abs Immature Granulocytes: 0.03 10*3/uL (ref 0.00–0.07)
Basophils Absolute: 0.1 10*3/uL (ref 0.0–0.1)
Basophils Relative: 1 %
Eosinophils Absolute: 0.1 10*3/uL (ref 0.0–0.5)
Eosinophils Relative: 1 %
HCT: 35.2 % — ABNORMAL LOW (ref 39.0–52.0)
Hemoglobin: 11.8 g/dL — ABNORMAL LOW (ref 13.0–17.0)
Immature Granulocytes: 0 %
Lymphocytes Relative: 24 %
Lymphs Abs: 2 10*3/uL (ref 0.7–4.0)
MCH: 29.5 pg (ref 26.0–34.0)
MCHC: 33.5 g/dL (ref 30.0–36.0)
MCV: 88 fL (ref 80.0–100.0)
Monocytes Absolute: 1.1 10*3/uL — ABNORMAL HIGH (ref 0.1–1.0)
Monocytes Relative: 13 %
Neutro Abs: 5.3 10*3/uL (ref 1.7–7.7)
Neutrophils Relative %: 61 %
Platelet Count: 466 10*3/uL — ABNORMAL HIGH (ref 150–400)
RBC: 4 MIL/uL — ABNORMAL LOW (ref 4.22–5.81)
RDW: 15.1 % (ref 11.5–15.5)
WBC Count: 8.6 10*3/uL (ref 4.0–10.5)
nRBC: 0 % (ref 0.0–0.2)

## 2020-10-29 LAB — CMP (CANCER CENTER ONLY)
ALT: 19 U/L (ref 0–44)
AST: 18 U/L (ref 15–41)
Albumin: 2.9 g/dL — ABNORMAL LOW (ref 3.5–5.0)
Alkaline Phosphatase: 126 U/L (ref 38–126)
Anion gap: 8 (ref 5–15)
BUN: 15 mg/dL (ref 8–23)
CO2: 29 mmol/L (ref 22–32)
Calcium: 9 mg/dL (ref 8.9–10.3)
Chloride: 100 mmol/L (ref 98–111)
Creatinine: 0.79 mg/dL (ref 0.61–1.24)
GFR, Estimated: >60 mL/min (ref >60)
Glucose, Bld: 96 mg/dL (ref 70–99)
Potassium: 4.5 mmol/L (ref 3.5–5.1)
Sodium: 137 mmol/L (ref 135–145)
Total Bilirubin: 1 mg/dL (ref 0.3–1.2)
Total Protein: 7 g/dL (ref 6.5–8.1)

## 2020-10-29 LAB — FERRITIN: Ferritin: 650 ng/mL — ABNORMAL HIGH (ref 24–336)

## 2020-10-29 LAB — CEA (IN HOUSE-CHCC): CEA (CHCC-In House): 1.95 ng/mL (ref 0.00–5.00)

## 2020-10-29 MED ORDER — PALONOSETRON HCL INJECTION 0.25 MG/5ML
0.2500 mg | Freq: Once | INTRAVENOUS | Status: AC
  Administered 2020-10-29: 0.25 mg via INTRAVENOUS

## 2020-10-29 MED ORDER — SODIUM CHLORIDE 0.9 % IV SOLN
2400.0000 mg/m2 | INTRAVENOUS | Status: DC
  Administered 2020-10-29: 4300 mg via INTRAVENOUS
  Filled 2020-10-29: qty 86

## 2020-10-29 MED ORDER — PALONOSETRON HCL INJECTION 0.25 MG/5ML
INTRAVENOUS | Status: AC
  Filled 2020-10-29: qty 5

## 2020-10-29 MED ORDER — SODIUM CHLORIDE 0.9 % IV SOLN
300.0000 mg | Freq: Once | INTRAVENOUS | Status: AC
  Administered 2020-10-29: 300 mg via INTRAVENOUS
  Filled 2020-10-29: qty 12

## 2020-10-29 MED ORDER — SODIUM CHLORIDE 0.9 % IV SOLN
Freq: Once | INTRAVENOUS | Status: AC
  Filled 2020-10-29: qty 250

## 2020-10-29 MED ORDER — ATROPINE SULFATE 1 MG/ML IJ SOLN
0.5000 mg | Freq: Once | INTRAMUSCULAR | Status: AC | PRN
  Administered 2020-10-29: 0.5 mg via INTRAVENOUS

## 2020-10-29 MED ORDER — SODIUM CHLORIDE 0.9 % IV SOLN
10.0000 mg | Freq: Once | INTRAVENOUS | Status: AC
  Administered 2020-10-29: 10 mg via INTRAVENOUS
  Filled 2020-10-29: qty 1
  Filled 2020-10-29: qty 10

## 2020-10-29 MED ORDER — HEPARIN SOD (PORK) LOCK FLUSH 100 UNIT/ML IV SOLN
500.0000 [IU] | Freq: Once | INTRAVENOUS | Status: DC | PRN
  Filled 2020-10-29: qty 5

## 2020-10-29 MED ORDER — SODIUM CHLORIDE 0.9% FLUSH
10.0000 mL | INTRAVENOUS | Status: DC | PRN
  Administered 2020-10-29: 10 mL
  Filled 2020-10-29: qty 10

## 2020-10-29 MED ORDER — ATROPINE SULFATE 1 MG/ML IJ SOLN
INTRAMUSCULAR | Status: AC
  Filled 2020-10-29: qty 1

## 2020-10-29 MED ORDER — SODIUM CHLORIDE 0.9 % IV SOLN
400.0000 mg/m2 | Freq: Once | INTRAVENOUS | Status: AC
  Administered 2020-10-29: 720 mg via INTRAVENOUS
  Filled 2020-10-29: qty 36

## 2020-10-29 MED ORDER — SODIUM CHLORIDE 0.9 % IV SOLN
100.0000 mg/m2 | Freq: Once | INTRAVENOUS | Status: AC
  Administered 2020-10-29: 180 mg via INTRAVENOUS
  Filled 2020-10-29: qty 9

## 2020-10-29 MED ORDER — SODIUM CHLORIDE 0.9% FLUSH
10.0000 mL | INTRAVENOUS | Status: DC | PRN
  Filled 2020-10-29: qty 10

## 2020-10-29 NOTE — Progress Notes (Signed)
Per Cira Rue NP, urine not needed for today's treatment.

## 2020-10-29 NOTE — Patient Instructions (Signed)

## 2020-10-29 NOTE — Patient Instructions (Signed)
[facility] Discharge Instructions for Patients Receiving Chemotherapy  Today you received the following chemotherapy agents bevacizumab, irinotecan, leucovorin, fluorourcil.  To help prevent nausea and vomiting after your treatment, we encourage you to take your nausea medication as directed.   If you develop nausea and vomiting that is not controlled by your nausea medication, call the clinic.   BELOW ARE SYMPTOMS THAT SHOULD BE REPORTED IMMEDIATELY:  *FEVER GREATER THAN 100.5 F  *CHILLS WITH OR WITHOUT FEVER  NAUSEA AND VOMITING THAT IS NOT CONTROLLED WITH YOUR NAUSEA MEDICATION  *UNUSUAL SHORTNESS OF BREATH  *UNUSUAL BRUISING OR BLEEDING  TENDERNESS IN MOUTH AND THROAT WITH OR WITHOUT PRESENCE OF ULCERS  *URINARY PROBLEMS  *BOWEL PROBLEMS  UNUSUAL RASH Items with * indicate a potential emergency and should be followed up as soon as possible.  Feel free to call the clinic should you have any questions or concerns. The clinic phone number is [phone].  Please show the CHEMO ALERT CARD at check-in to the Emergency Department and triage nurse.   

## 2020-10-29 NOTE — Telephone Encounter (Signed)
Scheduled per 11/17 los. Pt is aware of appt times and date.

## 2020-10-29 NOTE — Progress Notes (Signed)
Nutrition follow-up completed with patient during infusion for metastatic colon cancer. Weight is stable and was documented as 134.9 pounds November 17.  Stable from 135.6 pounds November 3. Noted albumin 2.9. Patient reports he continues to rely on ready prepared foods because he does not nor will not cook.  He is using frozen meals and eating out when possible. Reports diarrhea has pretty much resolved. Reports increasing variety since our last follow-up and also tries to eat smaller amounts of food more often.  Nutrition diagnosis: Food and nutrition related knowledge deficit improved.  Intervention: Educated patient to continue strategies for adequate calorie and protein intake and small frequent meals and snacks. Reviewed ready prepared foods to pick up when he goes to the grocery store. Continue compliance with medication for diarrhea as needed.  Monitoring, evaluation, goals: Patient will tolerate increased calories and protein to minimize further weight loss.  Next visit: Follow-up not scheduled.  Please reconsult dietitian if nutrition issues identified.  **Disclaimer: This note was dictated with voice recognition software. Similar sounding words can inadvertently be transcribed and this note may contain transcription errors which may not have been corrected upon publication of note.**

## 2020-10-31 ENCOUNTER — Other Ambulatory Visit: Payer: Self-pay

## 2020-10-31 ENCOUNTER — Inpatient Hospital Stay: Payer: Medicare Other

## 2020-10-31 DIAGNOSIS — Z5112 Encounter for antineoplastic immunotherapy: Secondary | ICD-10-CM | POA: Diagnosis not present

## 2020-10-31 DIAGNOSIS — C182 Malignant neoplasm of ascending colon: Secondary | ICD-10-CM

## 2020-10-31 MED ORDER — HEPARIN SOD (PORK) LOCK FLUSH 100 UNIT/ML IV SOLN
500.0000 [IU] | Freq: Once | INTRAVENOUS | Status: AC | PRN
  Administered 2020-10-31: 500 [IU]
  Filled 2020-10-31: qty 5

## 2020-10-31 MED ORDER — SODIUM CHLORIDE 0.9% FLUSH
10.0000 mL | INTRAVENOUS | Status: DC | PRN
  Administered 2020-10-31: 10 mL
  Filled 2020-10-31: qty 10

## 2020-10-31 NOTE — Patient Instructions (Signed)

## 2020-11-04 ENCOUNTER — Other Ambulatory Visit: Payer: Self-pay

## 2020-11-04 ENCOUNTER — Inpatient Hospital Stay: Payer: Medicare Other

## 2020-11-04 ENCOUNTER — Encounter: Payer: Self-pay | Admitting: Nurse Practitioner

## 2020-11-04 ENCOUNTER — Inpatient Hospital Stay (HOSPITAL_BASED_OUTPATIENT_CLINIC_OR_DEPARTMENT_OTHER): Payer: Medicare Other | Admitting: Nurse Practitioner

## 2020-11-04 VITALS — BP 125/84 | HR 86 | Temp 97.8°F | Resp 18 | Ht 66.0 in | Wt 131.6 lb

## 2020-11-04 DIAGNOSIS — C182 Malignant neoplasm of ascending colon: Secondary | ICD-10-CM

## 2020-11-04 DIAGNOSIS — Z95828 Presence of other vascular implants and grafts: Secondary | ICD-10-CM

## 2020-11-04 DIAGNOSIS — R300 Dysuria: Secondary | ICD-10-CM

## 2020-11-04 DIAGNOSIS — D500 Iron deficiency anemia secondary to blood loss (chronic): Secondary | ICD-10-CM

## 2020-11-04 DIAGNOSIS — Z5112 Encounter for antineoplastic immunotherapy: Secondary | ICD-10-CM | POA: Diagnosis not present

## 2020-11-04 LAB — CBC WITH DIFFERENTIAL (CANCER CENTER ONLY)
Abs Immature Granulocytes: 0.02 10*3/uL (ref 0.00–0.07)
Basophils Absolute: 0 10*3/uL (ref 0.0–0.1)
Basophils Relative: 1 %
Eosinophils Absolute: 0 10*3/uL (ref 0.0–0.5)
Eosinophils Relative: 1 %
HCT: 35.3 % — ABNORMAL LOW (ref 39.0–52.0)
Hemoglobin: 11.7 g/dL — ABNORMAL LOW (ref 13.0–17.0)
Immature Granulocytes: 0 %
Lymphocytes Relative: 34 %
Lymphs Abs: 2 10*3/uL (ref 0.7–4.0)
MCH: 29.5 pg (ref 26.0–34.0)
MCHC: 33.1 g/dL (ref 30.0–36.0)
MCV: 88.9 fL (ref 80.0–100.0)
Monocytes Absolute: 0.1 10*3/uL (ref 0.1–1.0)
Monocytes Relative: 2 %
Neutro Abs: 3.6 10*3/uL (ref 1.7–7.7)
Neutrophils Relative %: 62 %
Platelet Count: 297 10*3/uL (ref 150–400)
RBC: 3.97 MIL/uL — ABNORMAL LOW (ref 4.22–5.81)
RDW: 14.8 % (ref 11.5–15.5)
WBC Count: 5.8 10*3/uL (ref 4.0–10.5)
nRBC: 0 % (ref 0.0–0.2)

## 2020-11-04 LAB — CMP (CANCER CENTER ONLY)
ALT: 23 U/L (ref 0–44)
AST: 17 U/L (ref 15–41)
Albumin: 3.1 g/dL — ABNORMAL LOW (ref 3.5–5.0)
Alkaline Phosphatase: 109 U/L (ref 38–126)
Anion gap: 10 (ref 5–15)
BUN: 17 mg/dL (ref 8–23)
CO2: 27 mmol/L (ref 22–32)
Calcium: 9.5 mg/dL (ref 8.9–10.3)
Chloride: 100 mmol/L (ref 98–111)
Creatinine: 0.86 mg/dL (ref 0.61–1.24)
GFR, Estimated: >60 mL/min (ref >60)
Glucose, Bld: 113 mg/dL — ABNORMAL HIGH (ref 70–99)
Potassium: 4.1 mmol/L (ref 3.5–5.1)
Sodium: 137 mmol/L (ref 135–145)
Total Bilirubin: 0.9 mg/dL (ref 0.3–1.2)
Total Protein: 7.1 g/dL (ref 6.5–8.1)

## 2020-11-04 MED ORDER — ONDANSETRON HCL 8 MG PO TABS: 8.0000 mg | ORAL_TABLET | Freq: Three times a day (TID) | ORAL | 0 refills | Status: AC | PRN

## 2020-11-04 MED ORDER — SODIUM CHLORIDE 0.9 % IV SOLN
Freq: Once | INTRAVENOUS | Status: AC
  Filled 2020-11-04: qty 250

## 2020-11-04 MED ORDER — SODIUM CHLORIDE 0.9% FLUSH
10.0000 mL | INTRAVENOUS | Status: DC | PRN
  Administered 2020-11-04: 10 mL
  Filled 2020-11-04: qty 10

## 2020-11-04 NOTE — Patient Instructions (Signed)

## 2020-11-04 NOTE — Progress Notes (Signed)
Pt stable at discharge.  

## 2020-11-04 NOTE — Progress Notes (Signed)
Nathaniel Norton   [phone] Fax:([phone]   Clinic Follow up Note   Patient Care Team: Lajean Manes, MD as PCP - General (Internal Medicine) Berle Mull, MD as Consulting Physician (Family Medicine) Clarene Essex, MD as Consulting Physician (Gastroenterology) 11/04/2020  CHIEF COMPLAINT: Follow-up colon cancer, toxicity check  SUMMARY OF ONCOLOGIC HISTORY: Oncology History Overview Note  Cancer Staging Cancer of right colon [facility]) Staging form: Colon and Rectum, AJCC 8th Edition - Clinical stage from 04/09/2019: Stage IVC (cTX, cNX, pM1c) - Signed by Truitt Merle, MD on 05/03/2019     Cancer of right colon [facility])  04/09/2019 Procedure   Colonoscopy 04/09/19 by Dr Watt Climes IMPRESSION -internal hemorrhoids -Diverticulosis in the sigmoid colon  -2 small polyps in the rectum and in the proximal transverse colon, removed with a hot snare. Resected and retrieved.  -3 medium polyps in the proximal transverse colon, in the mid transverse colon and in the distal transverse colon, removed and resected and retrieved.  -likely malignant partially obstructing tumor at the hepatic flexure. biopsied, tattooed.  -1 large polyp in the mid ascending colon  -the examination was otherwise normal     04/09/2019 Initial Biopsy   FINAL MICROSCOPIC DIAGNOSIS: 04/09/19 1. LG intestine-hepatic flexure, Biopsy:   INVASIVE WELL DIFFERENTIATED ADENOCARCINOMA   04/09/2019 Cancer Staging   Staging form: Colon and Rectum, AJCC 8th Edition - Clinical stage from 04/09/2019: Stage IVC (cTX, cNX, pM1c) - Signed by Truitt Merle, MD on 05/03/2019   04/10/2019 Imaging   CT AP 04/10/19  IMPRESSION: 1. There is an eccentric mass of the colon involving the ascending colon near the hepatic flexure measuring approximately 3.4 x 3.4 by 2.0 cm (series 2, image 37, series 3, image 37). There is extensive soft tissue nodularity of the mesocolon and omentum, and likely areas of the peritoneum, for  example bilateral upper quadrants (series 2, image 25). Findings are consistent with primary colon malignancy, probable omental and peritoneal involvement, and small volume malignant ascites. 2.  Other chronic and incidental findings as detailed above.   04/20/2019 Initial Diagnosis   Cancer of right colon ([place])   04/26/2019 Imaging   CT Chest 04/26/19 IMPRESSION: 1. Borderline to mild lower thoracic adenopathy, including within the right internal mammary and juxta cardiophrenic stations. Given the appearance of the upper abdomen, suspicious for nodal metastasis. 2. A low right paratracheal node is borderline sized, but favored to be reactive. 3. No evidence of pulmonary metastasis. 4. Peritoneal metastasis and abdominal ascites, as before. 5. Pancreatic parenchymal calcifications indicative of chronic calcific pancreatitis. 6. Coronary artery atherosclerosis.   04/27/2019 Pathology Results   Diagnosis 04/27/19 Peritoneum, biopsy, right upper quadrant, perihepatic - ADENOCARCINOMA, CONSISTENT WITH COLONIC PRIMARY. - SEE COMMENT.   05/16/2019 - 10/31/2019 Chemotherapy   First line FOLFOX every 2 weeks with Avastin starting with cycle 2 starting 05/16/19. 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 Stopped after 10/31/19 for surgery.    07/23/2019 Imaging    CT AP W Contrast  IMPRESSION: 1. Primary ascending colon mass may be minimally smaller. Peritoneal metastatic disease appears slightly improved as well. 2. Chronic calcific pancreatitis. 3. Tiny left renal stone. 4. Small left inguinal hernia contains a knuckle of unobstructed colon. 5. Enlarged prostate.   10/29/2019 Imaging   CT AP W Contrast IMPRESSION: No significant change in small ascending colon soft tissue mass and diffuse  omental carcinomatosis.   No new or  progressive metastatic disease identified.   Stable small left inguinal hernia containing a loop of sigmoid colon. No evidence of bowel obstruction or strangulation.   Colonic diverticulosis. No radiographic evidence of diverticulitis.   Stable enlarged prostate.   12/17/2019 Pathology Results   HIPEC Surgery by Dr Clovis Riley 12/17/19 FINAL PATHOLOGIC DIAGNOSIS  MICROSCOPIC EXAMINATION AND DIAGNOSIS  A.          "RIGHT COLON, OMENTUM, SPLEEN":       Invasive adenocarcinoma with mucinous features, moderately differentiated.  Tumor involves mesentery and spleen.  Five out of ten lymph nodes involved by adenocarcinoma with perinodal soft tissue involvement (5/10), see comment.  Margins are uninvolved by invasive carcinoma.  Ileum and appendix, uninvolved.  See comment and cancer case summary.  B.          "ROUND LIGAMENT OF LIVER":       Involved by adenocarcinoma with mucinous features, moderately differentiated.  C.          "RIGHT DIAPHRAM STRIPPING":       Negative for carcinoma.  D.          "RIGHT HEPATIC CAPSULECTOMY":       Chronic inflammation and fibrosis.  Negative for carcinoma.  E.          "DEBRIDED TUMOR":       Involved by adenocarcinoma with mucinous features, moderately differentiated.   COMMENT: Sections disclose five out of ten lymph nodes involved by adenocarcinoma with perinodal soft tissue involvement. However, it is not possible to be certain if this represents lymph nodes replaced by tumor and/or soft tissue involvement. In addition, focal perineural invasion is seen.   04/03/2020 Imaging   CT AP W contrast  IMPRESSION: 1. Status post interval splenectomy and right hemicolectomy. 2. Response to therapy of omental/peritoneal metastasis. The only residual indeterminate finding is fluid density along the capsule of the hepatic dome which could be postoperative or secondary to localized residual peritoneal disease. 3. Left nephrolithiasis. 4.   Possible constipation. 5. Prostatomegaly. 6.  Aortic Atherosclerosis (ICD10-I70.0).   07/11/2020 Imaging   CT AP  1.  Postsurgical changes of subtle reductive surgery including right hemicolectomy, omentectomy, splenectomy, and right hepatic capsulectomy.  2.  Interval development of multiple areas of low attenuation involving the liver, as described above. This the intraparenchymal low-density lesion is concerning for metastatic disease. Other disease along the capsule may be post therapy related. Residual thickening of the anterior peritoneum particularly anterior to the right lobe of the liver is also concerning for some residual disease although appears significantly improved. Recommend attention on follow-up.  3.  No definite evidence of residual peritoneal disease status post HIPEC.   09/18/2020 -  Chemotherapy   Second line chemo FOLFIRI and bevacizumab q2weeks starting 09/18/20     CURRENT THERAPY: Second line chemo FOLFIRI andbevacizumabq2weeks starting 09/18/20  INTERVAL HISTORY: Nathaniel Norton returns for follow-up as scheduled.  He received dose reduced cycle 3 FOLFIRI and bevacizumab on 11/17.  He continued taking Imodium once daily on days 1 and 2.  On day 2 he developed abdominal cramping, no BM despite eating 3 meals and snacks per day and drinking his normal amount. On days 3 and 4 he had small but formed incomplete BMs.  He vomited once, Zofran was helpful.  On day 5 he had a small soft BM that culminated in a large cleanout BM every few minutes over an hour.  During this episode on the  toilet he developed cold sweats and lightheadedness.  After this he felt much better. Days 6 and 7 were also better, if not for the cold he feels like he could do something active. He did not have any difficulties urinating since receiving chemo.  Did not take potassium consistently. Denies fever, chills, cough, chest pain, dyspnea, N/V, or mucositis.    MEDICAL HISTORY:  Past Medical History:    Diagnosis Date  . colon ca dx'd 03/2019    SURGICAL HISTORY: Past Surgical History:  Procedure Laterality Date  . CATARACT EXTRACTION    . IR IMAGING GUIDED PORT INSERTION  05/10/2019  . L4 fracture  2012  . RETINAL DETACHMENT SURGERY    . Right hand surgery  1974    I have reviewed the social history and family history with the patient and they are unchanged from previous note.  ALLERGIES:  is allergic to feraheme [ferumoxytol] and codeine.  MEDICATIONS:  Current Outpatient Medications  Medication Sig Dispense Refill  . cyclobenzaprine (FLEXERIL) 5 MG tablet TAKE 1 TABLET BY MOUTH 3 TIMES DAILY AS NEEDED FOR UP TO 5 DAYS FOR MUSCLE SPASMS (MUSCULAR PAIN).    Marland Kitchen diphenoxylate-atropine (LOMOTIL) 2.5-0.025 MG tablet Take 1-2 tablets by mouth 4 (four) times daily as needed for diarrhea or loose stools. 90 tablet 1  . ferrous sulfate 325 (65 FE) MG tablet Take 325 mg by mouth daily with breakfast.    . fluticasone (FLONASE) 50 MCG/ACT nasal spray Place into the nose.    . mirtazapine (REMERON) 7.5 MG tablet Take 1 tablet (7.5 mg total) by mouth at bedtime. 30 tablet 1  . Multiple Vitamin (MULTIVITAMIN) tablet Take 1 tablet by mouth daily.    . ondansetron (ZOFRAN) 8 MG tablet Take 1 tablet (8 mg total) by mouth every 8 (eight) hours as needed for nausea or vomiting. Start on day 3 after chemotherapy 20 tablet 0  . potassium chloride SA (KLOR-CON) 20 MEQ tablet Take 1 tablet (20 mEq total) by mouth 2 (two) times daily. 40 tablet 1   No current facility-administered medications for this visit.   Facility-Administered Medications Ordered in Other Visits  Medication Dose Route Frequency Provider Last Rate Last Admin  . 0.9 %  sodium chloride infusion   Intravenous Once Alla Feeling, NP 500 mL/hr at 11/04/20 1426 New Bag at 11/04/20 1426    PHYSICAL EXAMINATION: ECOG PERFORMANCE STATUS: 1 - Symptomatic but completely ambulatory  Vitals:   11/04/20 1335  BP: 125/84  Pulse: 86   Resp: 18  Temp: 97.8 F (36.6 C)  SpO2: 100%   Filed Weights   11/04/20 1335  Weight: 131 lb 9.6 oz (59.7 kg)    GENERAL:alert, no distress and comfortable SKIN: No rash EYES: sclera clear OROPHARYNX: No thrush or ulcers LUNGS:  normal breathing effort HEART: no lower extremity edema NEURO: alert & oriented x 3 with fluent speech, no focal motor/sensory deficits PAC without erythema  LABORATORY DATA:  I have reviewed the data as listed CBC Latest Ref Rng & Units 11/04/2020 10/29/2020 10/15/2020  WBC 4.0 - 10.5 K/uL 5.8 8.6 9.8  Hemoglobin 13.0 - 17.0 g/dL 11.7(L) 11.8(L) 12.6(L)  Hematocrit 39 - 52 % 35.3(L) 35.2(L) 36.0(L)  Platelets 150 - 400 K/uL 297 466(H) 400     CMP Latest Ref Rng & Units 11/04/2020 10/29/2020 10/15/2020  Glucose 70 - 99 mg/dL 113(H) 96 110(H)  BUN 8 - 23 mg/dL '17 15 20  ' Creatinine 0.61 - 1.24 mg/dL 0.86 0.79 0.91  Sodium 135 - 145 mmol/L 137 137 134(L)  Potassium 3.5 - 5.1 mmol/L 4.1 4.5 3.0(L)  Chloride 98 - 111 mmol/L 100 100 98  CO2 22 - 32 mmol/L '27 29 25  ' Calcium 8.9 - 10.3 mg/dL 9.5 9.0 8.8(L)  Total Protein 6.5 - 8.1 g/dL 7.1 7.0 6.7  Total Bilirubin 0.3 - 1.2 mg/dL 0.9 1.0 1.6(H)  Alkaline Phos 38 - 126 U/L 109 126 157(H)  AST 15 - 41 U/L 17 18 46(H)  ALT 0 - 44 U/L 23 19 78(H)      RADIOGRAPHIC STUDIES: I have personally reviewed the radiological images as listed and agreed with the findings in the report. No results found.   ASSESSMENT & PLAN: Nathaniel Norton a 69 y.o.malewith    1. 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 His Colonoscopy biopsy showed adenocarcinoma of right hepatic flexurecolon.His peritoneal biopsy from 04/27/19 showsadenocarcinoma,consistent withmetastasis of his colon cancer. This is now stage IV disease. -He was treated with first-line FOLFOX in June-November 2020 before proceeding with HIPECsurgeryby Dr.  Dustin Folks 12/17/19. -Unfortunately he had disease progression on 09/10/20 PET withhypermetabolic pleural (rigt)and peritoneal nodules, which are consistent with metastatic disease. A biopsy was not felt to be necessary given his previously known peritoneal metastasis -He started second line FOLFIRI and bevacizumab q2weekson 09/18/20, tolerated first 2 cycles poorly with abdominal pain/cramping and diarrhea.  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 Watt Climes showed internal hemorrhoids, benign polyps, diverticulosis and colon cancer.  -He was treated with oral iron,IV Feraheme on 05/03/19 and 05/16/19(had reaction with 2nd dose). -With cancer recurrence his anemia recurred.Overall mild -Ferritin 650 on 10/29/2020  4. Elevated PSA  -PSA was 5.7 in 11/2018 and 4.6 in 01/2019. He has known enlarged prostate. -His 09/10/20 PET shows kidney stones and cysts andProstatomegaly, reflecting BPH -He recently developed nocturia, mild dysuria, and frequency with low urine output. He was not able to provide urine sample on 11/17 -dysuria resolved after receiving chemo on 11/17. He has been referred to urology   5.Goal of care discussion  -The patient understands the goal of care is palliative. -he is full code now  6. Mildhyperbilirubinemia -Overall mild and stable, will continue monitoring. -normal since 11/17  Disposition:  Nathaniel Norton appears stable.  He is s/p cycle 3-day 7 dose reduced FOLFIRI and  bevacizumab.  He tolerated low-dose irinotecan much better, actually had constipation until a large cleanout BM.  He will adjust Imodium next time.  Still losing some weight.  He is able to recover and feel better now a week out from chemo.  I discussed the role of PT for overall strength and conditioning, he will think about it.    We reviewed the CBC and CMP from today.  He will receive supportive care IV fluids today.   He will return for routine follow-up and cycle 4 next week, he knows to call sooner if he has any new or worsening side effects.   All questions were answered. The patient knows to call  the clinic with any problems, questions or concerns. No barriers to learning were detected.     Alla Feeling, NP 11/04/20

## 2020-11-07 ENCOUNTER — Telehealth: Payer: Self-pay | Admitting: Nurse Practitioner

## 2020-11-07 NOTE — Telephone Encounter (Signed)
No 11/23 los. No changes made to pt's schedule.

## 2020-11-11 NOTE — Progress Notes (Signed)
[place]   [phone] [phone]   Clinic Follow up Note   Patient Care Team: Lajean Manes, MD as PCP - General (Internal Medicine) Berle Mull, MD as Consulting Physician (Family Medicine) Clarene Essex, MD as Consulting Physician (Gastroenterology) 11/12/2020  CHIEF COMPLAINT: Follow-up metastatic colon cancer  SUMMARY OF ONCOLOGIC HISTORY: Oncology History Overview Note  Cancer Staging Cancer of right colon [facility]) Staging form: Colon and Rectum, AJCC 8th Edition - Clinical stage from 04/09/2019: Stage IVC (cTX, cNX, pM1c) - Signed by Truitt Merle, MD on 05/03/2019     Cancer of right colon [facility])  04/09/2019 Procedure   Colonoscopy 04/09/19 by Dr Watt Climes IMPRESSION -internal hemorrhoids -Diverticulosis in the sigmoid colon  -2 small polyps in the rectum and in the proximal transverse colon, removed with a hot snare. Resected and retrieved.  -3 medium polyps in the proximal transverse colon, in the mid transverse colon and in the distal transverse colon, removed and resected and retrieved.  -likely malignant partially obstructing tumor at the hepatic flexure. biopsied, tattooed.  -1 large polyp in the mid ascending colon  -the examination was otherwise normal     04/09/2019 Initial Biopsy   FINAL MICROSCOPIC DIAGNOSIS: 04/09/19 1. LG intestine-hepatic flexure, Biopsy:   INVASIVE WELL DIFFERENTIATED ADENOCARCINOMA   04/09/2019 Cancer Staging   Staging form: Colon and Rectum, AJCC 8th Edition - Clinical stage from 04/09/2019: Stage IVC (cTX, cNX, pM1c) - Signed by Truitt Merle, MD on 05/03/2019   04/10/2019 Imaging   CT AP 04/10/19  IMPRESSION: 1. There is an eccentric mass of the colon involving the ascending colon near the hepatic flexure measuring approximately 3.4 x 3.4 by 2.0 cm (series 2, image 37, series 3, image 37). There is extensive soft tissue nodularity of the mesocolon and omentum, and likely areas of the peritoneum, for example  bilateral upper quadrants (series 2, image 25). Findings are consistent with primary colon malignancy, probable omental and peritoneal involvement, and small volume malignant ascites. 2.  Other chronic and incidental findings as detailed above.   04/20/2019 Initial Diagnosis   Cancer of right colon ([place])   04/26/2019 Imaging   CT Chest 04/26/19 IMPRESSION: 1. Borderline to mild lower thoracic adenopathy, including within the right internal mammary and juxta cardiophrenic stations. Given the appearance of the upper abdomen, suspicious for nodal metastasis. 2. A low right paratracheal node is borderline sized, but favored to be reactive. 3. No evidence of pulmonary metastasis. 4. Peritoneal metastasis and abdominal ascites, as before. 5. Pancreatic parenchymal calcifications indicative of chronic calcific pancreatitis. 6. Coronary artery atherosclerosis.   04/27/2019 Pathology Results   Diagnosis 04/27/19 Peritoneum, biopsy, right upper quadrant, perihepatic - ADENOCARCINOMA, CONSISTENT WITH COLONIC PRIMARY. - SEE COMMENT.   05/16/2019 - 10/31/2019 Chemotherapy   First line FOLFOX every 2 weeks with Avastin starting with cycle 2 starting 05/16/19. 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 Stopped after 10/31/19 for surgery.    07/23/2019 Imaging    CT AP W Contrast  IMPRESSION: 1. Primary ascending colon mass may be minimally smaller. Peritoneal metastatic disease appears slightly improved as well. 2. Chronic calcific pancreatitis. 3. Tiny left renal stone. 4. Small left inguinal hernia contains a knuckle of unobstructed colon. 5. Enlarged prostate.   10/29/2019 Imaging   CT AP W Contrast IMPRESSION: No significant change in small ascending colon soft tissue mass and diffuse omental  carcinomatosis.   No new or  progressive metastatic disease identified.   Stable small left inguinal hernia containing a loop of sigmoid colon. No evidence of bowel obstruction or strangulation.   Colonic diverticulosis. No radiographic evidence of diverticulitis.   Stable enlarged prostate.   12/17/2019 Pathology Results   HIPEC Surgery by Dr Clovis Riley 12/17/19 FINAL PATHOLOGIC DIAGNOSIS  MICROSCOPIC EXAMINATION AND DIAGNOSIS  A.          "RIGHT COLON, OMENTUM, SPLEEN":       Invasive adenocarcinoma with mucinous features, moderately differentiated.  Tumor involves mesentery and spleen.  Five out of ten lymph nodes involved by adenocarcinoma with perinodal soft tissue involvement (5/10), see comment.  Margins are uninvolved by invasive carcinoma.  Ileum and appendix, uninvolved.  See comment and cancer case summary.  B.          "ROUND LIGAMENT OF LIVER":       Involved by adenocarcinoma with mucinous features, moderately differentiated.  C.          "RIGHT DIAPHRAM STRIPPING":       Negative for carcinoma.  D.          "RIGHT HEPATIC CAPSULECTOMY":       Chronic inflammation and fibrosis.  Negative for carcinoma.  E.          "DEBRIDED TUMOR":       Involved by adenocarcinoma with mucinous features, moderately differentiated.   COMMENT: Sections disclose five out of ten lymph nodes involved by adenocarcinoma with perinodal soft tissue involvement. However, it is not possible to be certain if this represents lymph nodes replaced by tumor and/or soft tissue involvement. In addition, focal perineural invasion is seen.   04/03/2020 Imaging   CT AP W contrast  IMPRESSION: 1. Status post interval splenectomy and right hemicolectomy. 2. Response to therapy of omental/peritoneal metastasis. The only residual indeterminate finding is fluid density along the capsule of the hepatic dome which could be postoperative or secondary to localized residual peritoneal disease. 3. Left nephrolithiasis. 4.   Possible constipation. 5. Prostatomegaly. 6.  Aortic Atherosclerosis (ICD10-I70.0).   07/11/2020 Imaging   CT AP  1.  Postsurgical changes of subtle reductive surgery including right hemicolectomy, omentectomy, splenectomy, and right hepatic capsulectomy.  2.  Interval development of multiple areas of low attenuation involving the liver, as described above. This the intraparenchymal low-density lesion is concerning for metastatic disease. Other disease along the capsule may be post therapy related. Residual thickening of the anterior peritoneum particularly anterior to the right lobe of the liver is also concerning for some residual disease although appears significantly improved. Recommend attention on follow-up.  3.  No definite evidence of residual peritoneal disease status post HIPEC.   09/18/2020 -  Chemotherapy   Second line chemo FOLFIRI and bevacizumab q2weeks starting 09/18/20     CURRENT THERAPY: Second line chemo FOLFIRI andbevacizumabq2weeks starting 09/18/20  INTERVAL HISTORY: Mr. Anastacio returns for follow-up and treatment as scheduled.  He received dose reduced FOLFIRI and bevacizumab on 11/17.  He dealt with constipation for the week after treatment, seen on 11/23 for toxicity check and felt to be doing well.  He was given IV fluids.  Since last week he continues to feel pretty decent overall.  He is running errands.  He continues to have "strange constipation" with frequent but small BMs throughout the day.  He has bloating and gas.  The pain he felt prior to starting chemo has improved.  He is eating and drinking.  No  fever or chills.  No cough, chest pain, dyspnea or other new issues.   MEDICAL HISTORY:  Past Medical History:  Diagnosis Date  . colon ca dx'd 03/2019    SURGICAL HISTORY: Past Surgical History:  Procedure Laterality Date  . CATARACT EXTRACTION    . IR IMAGING GUIDED PORT INSERTION  05/10/2019  . L4 fracture  2012  . RETINAL DETACHMENT SURGERY    .  Right hand surgery  1974    I have reviewed the social history and family history with the patient and they are unchanged from previous note.  ALLERGIES:  is allergic to feraheme [ferumoxytol] and codeine.  MEDICATIONS:  Current Outpatient Medications  Medication Sig Dispense Refill  . cyclobenzaprine (FLEXERIL) 5 MG tablet TAKE 1 TABLET BY MOUTH 3 TIMES DAILY AS NEEDED FOR UP TO 5 DAYS FOR MUSCLE SPASMS (MUSCULAR PAIN).    Marland Kitchen diphenoxylate-atropine (LOMOTIL) 2.5-0.025 MG tablet Take 1-2 tablets by mouth 4 (four) times daily as needed for diarrhea or loose stools. 90 tablet 1  . ferrous sulfate 325 (65 FE) MG tablet Take 325 mg by mouth daily with breakfast.    . fluticasone (FLONASE) 50 MCG/ACT nasal spray Place into the nose.    . mirtazapine (REMERON) 7.5 MG tablet Take 1 tablet (7.5 mg total) by mouth at bedtime. 30 tablet 1  . Multiple Vitamin (MULTIVITAMIN) tablet Take 1 tablet by mouth daily.    . ondansetron (ZOFRAN) 8 MG tablet Take 1 tablet (8 mg total) by mouth every 8 (eight) hours as needed for nausea or vomiting. Start on day 3 after chemotherapy 20 tablet 0  . potassium chloride SA (KLOR-CON) 20 MEQ tablet Take 1 tablet (20 mEq total) by mouth 2 (two) times daily. 40 tablet 1   No current facility-administered medications for this visit.   Facility-Administered Medications Ordered in Other Visits  Medication Dose Route Frequency Provider Last Rate Last Admin  . fluorouracil (ADRUCIL) 4,300 mg in sodium chloride 0.9 % 64 mL chemo infusion  2,400 mg/m2 (Treatment Plan Recorded) Intravenous 1 day or 1 dose Truitt Merle, MD      . irinotecan (CAMPTOSAR) 180 mg in sodium chloride 0.9 % 500 mL chemo infusion  100 mg/m2 (Treatment Plan Recorded) Intravenous Once Truitt Merle, MD 339 mL/hr at 11/12/20 1150 180 mg at 11/12/20 1150  . leucovorin 720 mg in sodium chloride 0.9 % 250 mL infusion  400 mg/m2 (Treatment Plan Recorded) Intravenous Once Truitt Merle, MD 191 mL/hr at 11/12/20 1154 720 mg  at 11/12/20 1154    PHYSICAL EXAMINATION: ECOG PERFORMANCE STATUS: 1 - Symptomatic but completely ambulatory  Vitals:   11/12/20 0915  BP: 114/72  Pulse: 74  Resp: 18  Temp: 98.1 F (36.7 C)  SpO2: 100%   Filed Weights   11/12/20 0915  Weight: 136 lb (61.7 kg)    GENERAL:alert, no distress and comfortable SKIN: No rash EYES: sclera clear LUNGS: clear with normal breathing effort HEART: regular rate & rhythm, no lower extremity edema ABDOMEN:abdomen soft, non-tender and normal bowel sounds NEURO: alert & oriented x 3 with fluent speech PAC without erythema  LABORATORY DATA:  I have reviewed the data as listed CBC Latest Ref Rng & Units 11/12/2020 11/04/2020 10/29/2020  WBC 4.0 - 10.5 K/uL 5.3 5.8 8.6  Hemoglobin 13.0 - 17.0 g/dL 10.9(L) 11.7(L) 11.8(L)  Hematocrit 39 - 52 % 33.6(L) 35.3(L) 35.2(L)  Platelets 150 - 400 K/uL 286 297 466(H)     CMP Latest Ref Rng & Units 11/12/2020  11/04/2020 10/29/2020  Glucose 70 - 99 mg/dL 100(H) 113(H) 96  BUN 8 - 23 mg/dL '8 17 15  ' Creatinine 0.61 - 1.24 mg/dL 0.73 0.86 0.79  Sodium 135 - 145 mmol/L 139 137 137  Potassium 3.5 - 5.1 mmol/L 3.8 4.1 4.5  Chloride 98 - 111 mmol/L 104 100 100  CO2 22 - 32 mmol/L '30 27 29  ' Calcium 8.9 - 10.3 mg/dL 9.5 9.5 9.0  Total Protein 6.5 - 8.1 g/dL 6.6 7.1 7.0  Total Bilirubin 0.3 - 1.2 mg/dL 0.8 0.9 1.0  Alkaline Phos 38 - 126 U/L 97 109 126  AST 15 - 41 U/L 14(L) 17 18  ALT 0 - 44 U/L '16 23 19      ' RADIOGRAPHIC STUDIES: I have personally reviewed the radiological images as listed and agreed with the findings in the report. No results found.   ASSESSMENT & PLAN: Nathaniel Norton a 69 y.o.malewith    1. 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 His Colonoscopy biopsy showed adenocarcinoma of right hepatic flexurecolon.His peritoneal biopsy from 04/27/19 showsadenocarcinoma,consistent  withmetastasis of his colon cancer. This is now stage IV disease. -He was treated with first-line FOLFOX in June-November 2020 before proceeding with HIPECsurgeryby Dr. Dustin Folks 12/17/19. -Unfortunately he had disease progression on 09/10/20 PET withhypermetabolic pleural (rigt)and peritoneal nodules, which are consistent with metastatic disease.A biopsy was not felt to be necessarygiven his previously known peritoneal metastasis -He started second lineFOLFIRI and bevacizumab q2weekson 09/18/20, tolerated first 2 cycles poorly with abdominal pain/cramping and diarrhea. 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 Watt Climes showed internal hemorrhoids, benign polyps, diverticulosis and colon cancer.  -He was treated with oral iron,IV Feraheme on 05/03/19 and 05/16/19(had reaction with 2nd dose). -With cancer recurrence his anemia recurred.Overall mild -Ferritin 650 on 10/29/2020  4. Elevated PSA  -PSA was 5.7 in 11/2018 and 4.6 in 01/2019. He has known enlarged prostate. -His 09/10/20 PET shows kidney stones and cysts andProstatomegaly, reflecting BPH -He recently developed nocturia, mild dysuria, and frequency with low urine output. He was not able to provide urine sample on 11/17 -dysuria resolved after receiving chemo on 11/17. He has been referred to urology   5.Goal of care discussion  -The patient understands the goal of  care is palliative. -he is full code now  6. Mildhyperbilirubinemia -Overall mild and stable, will continue monitoring. -normal since 11/17  Disposition: Mr. Dymek appears stable.  He completed 3 cycles of FOLFIRI and bevacizumab, cycle 3 dose reduced irinotecan. He tolerated much better.  He has recovered well.  He has intermittent cramping and gas, I recommend to use Gas-X.  The abdominal pain he had prior to beginning chemo has improved.  CEA also normalized.  This supports a clinical response to treatment.  CBC and CMP from today are stable.  He will proceed with another dose FOLFIRI and bevacizumab with current dose reduced irinotecan.  We reviewed symptom management.    Follow-up in 2 weeks  with cycle 5.  He knows to call if he has any problems in the interim.  All questions were answered. The patient knows to call the clinic with any problems, questions or concerns. No barriers to learning were detected.     Alla Feeling, NP 11/12/20

## 2020-11-12 ENCOUNTER — Inpatient Hospital Stay: Payer: Medicare Other

## 2020-11-12 ENCOUNTER — Inpatient Hospital Stay: Payer: Medicare Other | Attending: Hematology

## 2020-11-12 ENCOUNTER — Encounter: Payer: Self-pay | Admitting: Nurse Practitioner

## 2020-11-12 ENCOUNTER — Other Ambulatory Visit: Payer: Self-pay

## 2020-11-12 ENCOUNTER — Inpatient Hospital Stay (HOSPITAL_BASED_OUTPATIENT_CLINIC_OR_DEPARTMENT_OTHER): Payer: Medicare Other | Admitting: Nurse Practitioner

## 2020-11-12 VITALS — BP 114/72 | HR 74 | Temp 98.1°F | Resp 18 | Ht 66.0 in | Wt 136.0 lb

## 2020-11-12 DIAGNOSIS — R197 Diarrhea, unspecified: Secondary | ICD-10-CM | POA: Diagnosis not present

## 2020-11-12 DIAGNOSIS — R809 Proteinuria, unspecified: Secondary | ICD-10-CM

## 2020-11-12 DIAGNOSIS — D500 Iron deficiency anemia secondary to blood loss (chronic): Secondary | ICD-10-CM

## 2020-11-12 DIAGNOSIS — C182 Malignant neoplasm of ascending colon: Secondary | ICD-10-CM | POA: Insufficient documentation

## 2020-11-12 DIAGNOSIS — Z885 Allergy status to narcotic agent status: Secondary | ICD-10-CM | POA: Diagnosis not present

## 2020-11-12 DIAGNOSIS — R972 Elevated prostate specific antigen [PSA]: Secondary | ICD-10-CM | POA: Insufficient documentation

## 2020-11-12 DIAGNOSIS — R1084 Generalized abdominal pain: Secondary | ICD-10-CM | POA: Diagnosis not present

## 2020-11-12 DIAGNOSIS — C786 Secondary malignant neoplasm of retroperitoneum and peritoneum: Secondary | ICD-10-CM | POA: Diagnosis present

## 2020-11-12 DIAGNOSIS — R159 Full incontinence of feces: Secondary | ICD-10-CM | POA: Insufficient documentation

## 2020-11-12 DIAGNOSIS — Z9049 Acquired absence of other specified parts of digestive tract: Secondary | ICD-10-CM | POA: Insufficient documentation

## 2020-11-12 DIAGNOSIS — Z79899 Other long term (current) drug therapy: Secondary | ICD-10-CM | POA: Insufficient documentation

## 2020-11-12 DIAGNOSIS — Z5112 Encounter for antineoplastic immunotherapy: Secondary | ICD-10-CM | POA: Insufficient documentation

## 2020-11-12 DIAGNOSIS — Z9081 Acquired absence of spleen: Secondary | ICD-10-CM | POA: Diagnosis not present

## 2020-11-12 DIAGNOSIS — Z95828 Presence of other vascular implants and grafts: Secondary | ICD-10-CM

## 2020-11-12 LAB — CMP (CANCER CENTER ONLY)
ALT: 16 U/L (ref 0–44)
AST: 14 U/L — ABNORMAL LOW (ref 15–41)
Albumin: 2.9 g/dL — ABNORMAL LOW (ref 3.5–5.0)
Alkaline Phosphatase: 97 U/L (ref 38–126)
Anion gap: 5 (ref 5–15)
BUN: 8 mg/dL (ref 8–23)
CO2: 30 mmol/L (ref 22–32)
Calcium: 9.5 mg/dL (ref 8.9–10.3)
Chloride: 104 mmol/L (ref 98–111)
Creatinine: 0.73 mg/dL (ref 0.61–1.24)
GFR, Estimated: >60 mL/min (ref >60)
Glucose, Bld: 100 mg/dL — ABNORMAL HIGH (ref 70–99)
Potassium: 3.8 mmol/L (ref 3.5–5.1)
Sodium: 139 mmol/L (ref 135–145)
Total Bilirubin: 0.8 mg/dL (ref 0.3–1.2)
Total Protein: 6.6 g/dL (ref 6.5–8.1)

## 2020-11-12 LAB — CBC WITH DIFFERENTIAL (CANCER CENTER ONLY)
Abs Immature Granulocytes: 0.01 10*3/uL (ref 0.00–0.07)
Basophils Absolute: 0 10*3/uL (ref 0.0–0.1)
Basophils Relative: 1 %
Eosinophils Absolute: 0.2 10*3/uL (ref 0.0–0.5)
Eosinophils Relative: 4 %
HCT: 33.6 % — ABNORMAL LOW (ref 39.0–52.0)
Hemoglobin: 10.9 g/dL — ABNORMAL LOW (ref 13.0–17.0)
Immature Granulocytes: 0 %
Lymphocytes Relative: 41 %
Lymphs Abs: 2.2 10*3/uL (ref 0.7–4.0)
MCH: 28.7 pg (ref 26.0–34.0)
MCHC: 32.4 g/dL (ref 30.0–36.0)
MCV: 88.4 fL (ref 80.0–100.0)
Monocytes Absolute: 0.8 10*3/uL (ref 0.1–1.0)
Monocytes Relative: 15 %
Neutro Abs: 2 10*3/uL (ref 1.7–7.7)
Neutrophils Relative %: 39 %
Platelet Count: 286 10*3/uL (ref 150–400)
RBC: 3.8 MIL/uL — ABNORMAL LOW (ref 4.22–5.81)
RDW: 15.3 % (ref 11.5–15.5)
WBC Count: 5.3 10*3/uL (ref 4.0–10.5)
nRBC: 0 % (ref 0.0–0.2)

## 2020-11-12 MED ORDER — PALONOSETRON HCL INJECTION 0.25 MG/5ML
0.2500 mg | Freq: Once | INTRAVENOUS | Status: AC
  Administered 2020-11-12: 0.25 mg via INTRAVENOUS

## 2020-11-12 MED ORDER — SODIUM CHLORIDE 0.9 % IV SOLN
400.0000 mg/m2 | Freq: Once | INTRAVENOUS | Status: AC
  Administered 2020-11-12: 720 mg via INTRAVENOUS
  Filled 2020-11-12: qty 36

## 2020-11-12 MED ORDER — ATROPINE SULFATE 1 MG/ML IJ SOLN
INTRAMUSCULAR | Status: AC
  Filled 2020-11-12: qty 1

## 2020-11-12 MED ORDER — SODIUM CHLORIDE 0.9 % IV SOLN
2400.0000 mg/m2 | INTRAVENOUS | Status: DC
  Administered 2020-11-12: 4300 mg via INTRAVENOUS
  Filled 2020-11-12: qty 86

## 2020-11-12 MED ORDER — SODIUM CHLORIDE 0.9 % IV SOLN
Freq: Once | INTRAVENOUS | Status: AC
  Filled 2020-11-12: qty 250

## 2020-11-12 MED ORDER — SODIUM CHLORIDE 0.9 % IV SOLN
5.0000 mg/kg | Freq: Once | INTRAVENOUS | Status: AC
  Administered 2020-11-12: 300 mg via INTRAVENOUS
  Filled 2020-11-12: qty 12

## 2020-11-12 MED ORDER — PALONOSETRON HCL INJECTION 0.25 MG/5ML
INTRAVENOUS | Status: AC
  Filled 2020-11-12: qty 5

## 2020-11-12 MED ORDER — SODIUM CHLORIDE 0.9 % IV SOLN
100.0000 mg/m2 | Freq: Once | INTRAVENOUS | Status: AC
  Administered 2020-11-12: 180 mg via INTRAVENOUS
  Filled 2020-11-12: qty 9

## 2020-11-12 MED ORDER — ATROPINE SULFATE 1 MG/ML IJ SOLN
0.5000 mg | Freq: Once | INTRAMUSCULAR | Status: AC | PRN
  Administered 2020-11-12: 0.5 mg via INTRAVENOUS

## 2020-11-12 MED ORDER — SODIUM CHLORIDE 0.9 % IV SOLN
10.0000 mg | Freq: Once | INTRAVENOUS | Status: AC
  Administered 2020-11-12: 10 mg via INTRAVENOUS
  Filled 2020-11-12: qty 10

## 2020-11-12 MED ORDER — SODIUM CHLORIDE 0.9% FLUSH
10.0000 mL | INTRAVENOUS | Status: DC | PRN
  Administered 2020-11-12: 10 mL
  Filled 2020-11-12: qty 10

## 2020-11-12 NOTE — Progress Notes (Signed)
Pt discharged in stable condition.

## 2020-11-12 NOTE — Patient Instructions (Signed)
[facility] Discharge Instructions for Patients Receiving Chemotherapy  Today you received the following chemotherapy agents: Bevacizumab, Irinotecan, Leucovorin, 5FU  To help prevent nausea and vomiting after your treatment, we encourage you to take your nausea medication as directed.    If you develop nausea and vomiting that is not controlled by your nausea medication, call the clinic.   BELOW ARE SYMPTOMS THAT SHOULD BE REPORTED IMMEDIATELY:  *FEVER GREATER THAN 100.5 F  *CHILLS WITH OR WITHOUT FEVER  NAUSEA AND VOMITING THAT IS NOT CONTROLLED WITH YOUR NAUSEA MEDICATION  *UNUSUAL SHORTNESS OF BREATH  *UNUSUAL BRUISING OR BLEEDING  TENDERNESS IN MOUTH AND THROAT WITH OR WITHOUT PRESENCE OF ULCERS  *URINARY PROBLEMS  *BOWEL PROBLEMS  UNUSUAL RASH Items with * indicate a potential emergency and should be followed up as soon as possible.  Feel free to call the clinic should you have any questions or concerns. The clinic phone number is [phone].  Please show the [place] at check-in to the Emergency Department and triage nurse.

## 2020-11-14 ENCOUNTER — Other Ambulatory Visit: Payer: Self-pay

## 2020-11-14 ENCOUNTER — Inpatient Hospital Stay: Payer: Medicare Other

## 2020-11-14 VITALS — BP 118/70 | HR 72 | Temp 98.2°F | Resp 18

## 2020-11-14 DIAGNOSIS — C182 Malignant neoplasm of ascending colon: Secondary | ICD-10-CM

## 2020-11-14 DIAGNOSIS — Z5112 Encounter for antineoplastic immunotherapy: Secondary | ICD-10-CM | POA: Diagnosis not present

## 2020-11-14 MED ORDER — SODIUM CHLORIDE 0.9% FLUSH
10.0000 mL | INTRAVENOUS | Status: DC | PRN
  Administered 2020-11-14: 10 mL
  Filled 2020-11-14: qty 10

## 2020-11-14 MED ORDER — HEPARIN SOD (PORK) LOCK FLUSH 100 UNIT/ML IV SOLN
500.0000 [IU] | Freq: Once | INTRAVENOUS | Status: AC | PRN
  Administered 2020-11-14: 500 [IU]
  Filled 2020-11-14: qty 5

## 2020-11-24 ENCOUNTER — Ambulatory Visit: Payer: Medicare Other | Admitting: Hematology

## 2020-11-24 ENCOUNTER — Ambulatory Visit: Payer: Medicare Other

## 2020-11-24 ENCOUNTER — Ambulatory Visit: Payer: Medicare Other | Admitting: Nurse Practitioner

## 2020-11-24 ENCOUNTER — Other Ambulatory Visit: Payer: Medicare Other

## 2020-11-25 NOTE — Progress Notes (Signed)
[place]   [phone] [phone]   Clinic Follow up Note   Patient Care Team: Lajean Manes, MD as PCP - General (Internal Medicine) Berle Mull, MD as Consulting Physician (Family Medicine) Clarene Essex, MD as Consulting Physician (Gastroenterology) 11/26/2020  CHIEF COMPLAINT: Follow up colon cancer   SUMMARY OF ONCOLOGIC HISTORY: Oncology History Overview Note  Cancer Staging Cancer of right colon [facility]) Staging form: Colon and Rectum, AJCC 8th Edition - Clinical stage from 04/09/2019: Stage IVC (cTX, cNX, pM1c) - Signed by Truitt Merle, MD on 05/03/2019     Cancer of right colon [facility])  04/09/2019 Procedure   Colonoscopy 04/09/19 by Dr Watt Climes IMPRESSION -internal hemorrhoids -Diverticulosis in the sigmoid colon  -2 small polyps in the rectum and in the proximal transverse colon, removed with a hot snare. Resected and retrieved.  -3 medium polyps in the proximal transverse colon, in the mid transverse colon and in the distal transverse colon, removed and resected and retrieved.  -likely malignant partially obstructing tumor at the hepatic flexure. biopsied, tattooed.  -1 large polyp in the mid ascending colon  -the examination was otherwise normal     04/09/2019 Initial Biopsy   FINAL MICROSCOPIC DIAGNOSIS: 04/09/19 1. LG intestine-hepatic flexure, Biopsy:   INVASIVE WELL DIFFERENTIATED ADENOCARCINOMA   04/09/2019 Cancer Staging   Staging form: Colon and Rectum, AJCC 8th Edition - Clinical stage from 04/09/2019: Stage IVC (cTX, cNX, pM1c) - Signed by Truitt Merle, MD on 05/03/2019   04/10/2019 Imaging   CT AP 04/10/19  IMPRESSION: 1. There is an eccentric mass of the colon involving the ascending colon near the hepatic flexure measuring approximately 3.4 x 3.4 by 2.0 cm (series 2, image 37, series 3, image 37). There is extensive soft tissue nodularity of the mesocolon and omentum, and likely areas of the peritoneum, for example bilateral  upper quadrants (series 2, image 25). Findings are consistent with primary colon malignancy, probable omental and peritoneal involvement, and small volume malignant ascites. 2.  Other chronic and incidental findings as detailed above.   04/20/2019 Initial Diagnosis   Cancer of right colon ([place])   04/26/2019 Imaging   CT Chest 04/26/19 IMPRESSION: 1. Borderline to mild lower thoracic adenopathy, including within the right internal mammary and juxta cardiophrenic stations. Given the appearance of the upper abdomen, suspicious for nodal metastasis. 2. A low right paratracheal node is borderline sized, but favored to be reactive. 3. No evidence of pulmonary metastasis. 4. Peritoneal metastasis and abdominal ascites, as before. 5. Pancreatic parenchymal calcifications indicative of chronic calcific pancreatitis. 6. Coronary artery atherosclerosis.   04/27/2019 Pathology Results   Diagnosis 04/27/19 Peritoneum, biopsy, right upper quadrant, perihepatic - ADENOCARCINOMA, CONSISTENT WITH COLONIC PRIMARY. - SEE COMMENT.   05/16/2019 - 10/31/2019 Chemotherapy   First line FOLFOX every 2 weeks with Avastin starting with cycle 2 starting 05/16/19. 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 Stopped after 10/31/19 for surgery.    07/23/2019 Imaging    CT AP W Contrast  IMPRESSION: 1. Primary ascending colon mass may be minimally smaller. Peritoneal metastatic disease appears slightly improved as well. 2. Chronic calcific pancreatitis. 3. Tiny left renal stone. 4. Small left inguinal hernia contains a knuckle of unobstructed colon. 5. Enlarged prostate.   10/29/2019 Imaging   CT AP W Contrast IMPRESSION: No significant change in small ascending colon soft tissue mass and diffuse  omental carcinomatosis.   No new or progressive  metastatic disease identified.   Stable small left inguinal hernia containing a loop of sigmoid colon. No evidence of bowel obstruction or strangulation.   Colonic diverticulosis. No radiographic evidence of diverticulitis.   Stable enlarged prostate.   12/17/2019 Pathology Results   HIPEC Surgery by Dr Clovis Riley 12/17/19 FINAL PATHOLOGIC DIAGNOSIS  MICROSCOPIC EXAMINATION AND DIAGNOSIS  A.          "RIGHT COLON, OMENTUM, SPLEEN":       Invasive adenocarcinoma with mucinous features, moderately differentiated.  Tumor involves mesentery and spleen.  Five out of ten lymph nodes involved by adenocarcinoma with perinodal soft tissue involvement (5/10), see comment.  Margins are uninvolved by invasive carcinoma.  Ileum and appendix, uninvolved.  See comment and cancer case summary.  B.          "ROUND LIGAMENT OF LIVER":       Involved by adenocarcinoma with mucinous features, moderately differentiated.  C.          "RIGHT DIAPHRAM STRIPPING":       Negative for carcinoma.  D.          "RIGHT HEPATIC CAPSULECTOMY":       Chronic inflammation and fibrosis.  Negative for carcinoma.  E.          "DEBRIDED TUMOR":       Involved by adenocarcinoma with mucinous features, moderately differentiated.   COMMENT: Sections disclose five out of ten lymph nodes involved by adenocarcinoma with perinodal soft tissue involvement. However, it is not possible to be certain if this represents lymph nodes replaced by tumor and/or soft tissue involvement. In addition, focal perineural invasion is seen.   04/03/2020 Imaging   CT AP W contrast  IMPRESSION: 1. Status post interval splenectomy and right hemicolectomy. 2. Response to therapy of omental/peritoneal metastasis. The only residual indeterminate finding is fluid density along the capsule of the hepatic dome which could be postoperative or secondary to localized residual peritoneal disease. 3. Left nephrolithiasis. 4.  Possible  constipation. 5. Prostatomegaly. 6.  Aortic Atherosclerosis (ICD10-I70.0).   07/11/2020 Imaging   CT AP  1.  Postsurgical changes of subtle reductive surgery including right hemicolectomy, omentectomy, splenectomy, and right hepatic capsulectomy.  2.  Interval development of multiple areas of low attenuation involving the liver, as described above. This the intraparenchymal low-density lesion is concerning for metastatic disease. Other disease along the capsule may be post therapy related. Residual thickening of the anterior peritoneum particularly anterior to the right lobe of the liver is also concerning for some residual disease although appears significantly improved. Recommend attention on follow-up.  3.  No definite evidence of residual peritoneal disease status post HIPEC.   09/18/2020 -  Chemotherapy   Second line chemo FOLFIRI and bevacizumab q2weeks starting 09/18/20     CURRENT THERAPY: Second line chemo FOLFIRI andbevacizumabq2weeks starting 09/18/20  INTERVAL HISTORY: Mr. Dunson returns for follow up and cycle 5 FOLFIRI/bevacizumab as scheduled. He received cycle 4 on 12/1.  The week of treatment was "okay," low energy and appetite but otherwise decent.  He had somewhat delayed onset abdominal cramps and diarrhea on 12/13 and 12/14, resolved with Imodium.  Had not changed his diet. At one point abdominal cramp "felt like it did when I had the tumor."  He has not had recurrent diarrhea today.  Mild nausea is managed with Compazine.  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  He is interested in learning about  good exercises to build muscle/strength.  Also asking about "chemo pills" in the future. Denies any fever, chills, cough, chest pain, dyspnea or other new concerns.    MEDICAL HISTORY:  Past Medical History:  Diagnosis Date  . colon ca dx'd 03/2019    SURGICAL HISTORY: Past Surgical History:  Procedure Laterality Date  . CATARACT EXTRACTION     . IR IMAGING GUIDED PORT INSERTION  05/10/2019  . L4 fracture  2012  . RETINAL DETACHMENT SURGERY    . Right hand surgery  1974    I have reviewed the social history and family history with the patient and they are unchanged from previous note.  ALLERGIES:  is allergic to feraheme [ferumoxytol] and codeine.  MEDICATIONS:  Current Outpatient Medications  Medication Sig Dispense Refill  . cyclobenzaprine (FLEXERIL) 5 MG tablet TAKE 1 TABLET BY MOUTH 3 TIMES DAILY AS NEEDED FOR UP TO 5 DAYS FOR MUSCLE SPASMS (MUSCULAR PAIN).    Marland Kitchen diphenoxylate-atropine (LOMOTIL) 2.5-0.025 MG tablet Take 1-2 tablets by mouth 4 (four) times daily as needed for diarrhea or loose stools. 90 tablet 1  . ferrous sulfate 325 (65 FE) MG tablet Take 325 mg by mouth daily with breakfast.    . fluticasone (FLONASE) 50 MCG/ACT nasal spray Place into the nose.    . mirtazapine (REMERON) 7.5 MG tablet Take 1 tablet (7.5 mg total) by mouth at bedtime. 30 tablet 1  . Multiple Vitamin (MULTIVITAMIN) tablet Take 1 tablet by mouth daily.    . ondansetron (ZOFRAN) 8 MG tablet Take 1 tablet (8 mg total) by mouth every 8 (eight) hours as needed for nausea or vomiting. Start on day 3 after chemotherapy 20 tablet 0  . potassium chloride SA (KLOR-CON) 20 MEQ tablet Take 1 tablet (20 mEq total) by mouth 2 (two) times daily. 40 tablet 1  . prochlorperazine (COMPAZINE) 10 MG tablet Take 1 tablet (10 mg total) by mouth every 6 (six) hours as needed (Nausea or vomiting). 30 tablet 2   No current facility-administered medications for this visit.    PHYSICAL EXAMINATION: ECOG PERFORMANCE STATUS: 2 - Symptomatic, <50% confined to bed  Vitals:   11/26/20 1125  BP: 113/72  Pulse: 81  Resp: 17  Temp: 98.3 F (36.8 C)  SpO2: 100%   Filed Weights   11/26/20 1125  Weight: 132 lb (59.9 kg)    GENERAL:alert, no distress and comfortable SKIN: No rash EYES: sclera clear OROPHARYNX: No thrush or ulcers LUNGS:  normal breathing  effort HEART:  no lower extremity edema NEURO: alert & oriented x 3 with fluent speech, no focal motor deficits PAC without erythema  LABORATORY DATA:  I have reviewed the data as listed CBC Latest Ref Rng & Units 11/26/2020 11/12/2020 11/04/2020  WBC 4.0 - 10.5 K/uL 5.2 5.3 5.8  Hemoglobin 13.0 - 17.0 g/dL 11.7(L) 10.9(L) 11.7(L)  Hematocrit 39.0 - 52.0 % 35.9(L) 33.6(L) 35.3(L)  Platelets 150 - 400 K/uL 401(H) 286 297     CMP Latest Ref Rng & Units 11/12/2020 11/04/2020 10/29/2020  Glucose 70 - 99 mg/dL 100(H) 113(H) 96  BUN 8 - 23 mg/dL _0 Creatinine 0.61 - 1.24 mg/dL 0.73 0.86 0.79  Sodium 135 - 145 mmol/L 139 137 137  Potassium 3.5 - 5.1 mmol/L 3.8 4.1 4.5  Chloride 98 - 111 mmol/L 104 100 100  CO2 22 - 32 mmol/L _1 Calcium 8.9 - 10.3 mg/dL 9.5 9.5 9.0  Total Protein 6.5 - 8.1 g/dL 6.6  7.1 7.0  Total Bilirubin 0.3 - 1.2 mg/dL 0.8 0.9 1.0  Alkaline Phos 38 - 126 U/L 97 109 126  AST 15 - 41 U/L 14(L) 17 18  ALT 0 - 44 U/L _0 RADIOGRAPHIC STUDIES: I have personally reviewed the radiological images as listed and agreed with the findings in the report. No results found.   ASSESSMENT & PLAN: Nathaniel Norton a 69 y.o.malewith    1. 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 His Colonoscopy biopsy showed adenocarcinoma of right hepatic flexurecolon.His peritoneal biopsy from 04/27/19 showsadenocarcinoma,consistent withmetastasis of his colon cancer. This is now stage IV disease. -He was treated with first-line FOLFOX in June-November 2020 before proceeding with HIPECsurgeryby Dr. Dustin Folks 12/17/19. -Unfortunately he had disease progression on 09/10/20 PET withhypermetabolic pleural (rigt)and peritoneal nodules, which are consistent with metastatic disease.A biopsy was not felt to be necessarygiven his previously known peritoneal metastasis -He started  second lineFOLFIRI and bevacizumab q2weekson 09/18/20, tolerated first 2 cycles poorly with abdominal pain/cramping and diarrhea. 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 Watt Climes showed internal hemorrhoids, benign polyps, diverticulosis and colon cancer.  -He was treated with oral iron,IV Feraheme on 05/03/19 and 05/16/19(had reaction with 2nd dose). -With cancer recurrence his anemia recurred.Overall mild -Ferritin 650 on 10/29/2020  4. Elevated PSA  -PSA was 5.7 in 11/2018 and 4.6 in 01/2019. He has known enlarged prostate. -His 09/10/20 PET shows kidney stones and cysts andProstatomegaly, reflecting BPH -He recently developed nocturia, mild dysuria, and frequency with low urine output. He was not able to provide urine sample on 11/17 -dysuria resolved after receiving chemo on 11/17. He has been referred to urology  5.Goal of care discussion  -The patient understands the goal of care is palliative. -he is full code now  6. Mildhyperbilirubinemia -Overall mild and stable, will continue monitoring. -normal since 11/17  Disposition:  Mr. Kranz appears stable.  He completed 4 cycles of FOLFIRI and bevacizumab.  He tolerated last cycle moderately well with fatigue, low appetite, and delayed cramping diarrhea.  Side effects are managed with supportive meds at home.  He is able to recover and function mostly  well.  CBC is stable.  If CMP and urine protein are adequate he will proceed with cycle 5 FOLFIRI and bevacizumab today as planned.  Plan to restage after cycle 6 if CEA remains normal today, will follow up.  He will be interested in discussing maintenance regimen with Xeloda if the scan shows good response.  He will return for follow-up and cycle 6 in 2 weeks. Follow up with Dr. Burr Medico in 4 weeks to review CT.  Orders Placed This Encounter  Procedures  . CT CHEST ABDOMEN PELVIS W CONTRAST    Standing Status:   Future    Standing Expiration Date:   11/26/2021    Order Specific Question:   If indicated for the ordered procedure, I authorize the administration of contrast media per Radiology protocol  Answer:   Yes    Order Specific Question:   Preferred imaging location?    Answer:   [facility]    Order Specific Question:   Is Oral Contrast requested for this exam?    Answer:   Yes, Per Radiology protocol    Order Specific Question:   Reason for Exam (SYMPTOM  OR DIAGNOSIS REQUIRED)    Answer:   colon cancer s/p chemo, HIPEC surgery, recurrent and metastatic disease on chemo    All questions were answered. The patient knows to call the clinic with any problems, questions or concerns. No barriers to learning were detected.     Alla Feeling, NP 11/26/20

## 2020-11-26 ENCOUNTER — Ambulatory Visit: Payer: Medicare Other

## 2020-11-26 ENCOUNTER — Encounter: Payer: Self-pay | Admitting: Nurse Practitioner

## 2020-11-26 ENCOUNTER — Encounter: Payer: Medicare Other | Admitting: Nutrition

## 2020-11-26 ENCOUNTER — Ambulatory Visit: Payer: Medicare Other | Admitting: Nurse Practitioner

## 2020-11-26 ENCOUNTER — Inpatient Hospital Stay: Payer: Medicare Other

## 2020-11-26 ENCOUNTER — Other Ambulatory Visit: Payer: Medicare Other

## 2020-11-26 ENCOUNTER — Other Ambulatory Visit: Payer: Self-pay

## 2020-11-26 ENCOUNTER — Inpatient Hospital Stay (HOSPITAL_BASED_OUTPATIENT_CLINIC_OR_DEPARTMENT_OTHER): Payer: Medicare Other | Admitting: Nurse Practitioner

## 2020-11-26 DIAGNOSIS — R809 Proteinuria, unspecified: Secondary | ICD-10-CM

## 2020-11-26 DIAGNOSIS — D500 Iron deficiency anemia secondary to blood loss (chronic): Secondary | ICD-10-CM

## 2020-11-26 DIAGNOSIS — C182 Malignant neoplasm of ascending colon: Secondary | ICD-10-CM

## 2020-11-26 DIAGNOSIS — Z5112 Encounter for antineoplastic immunotherapy: Secondary | ICD-10-CM | POA: Diagnosis not present

## 2020-11-26 DIAGNOSIS — Z95828 Presence of other vascular implants and grafts: Secondary | ICD-10-CM

## 2020-11-26 LAB — CBC WITH DIFFERENTIAL (CANCER CENTER ONLY)
Abs Immature Granulocytes: 0.01 10*3/uL (ref 0.00–0.07)
Basophils Absolute: 0 10*3/uL (ref 0.0–0.1)
Basophils Relative: 0 %
Eosinophils Absolute: 0.1 10*3/uL (ref 0.0–0.5)
Eosinophils Relative: 1 %
HCT: 35.9 % — ABNORMAL LOW (ref 39.0–52.0)
Hemoglobin: 11.7 g/dL — ABNORMAL LOW (ref 13.0–17.0)
Immature Granulocytes: 0 %
Lymphocytes Relative: 48 %
Lymphs Abs: 2.4 10*3/uL (ref 0.7–4.0)
MCH: 28.8 pg (ref 26.0–34.0)
MCHC: 32.6 g/dL (ref 30.0–36.0)
MCV: 88.4 fL (ref 80.0–100.0)
Monocytes Absolute: 1 10*3/uL (ref 0.1–1.0)
Monocytes Relative: 18 %
Neutro Abs: 1.7 10*3/uL (ref 1.7–7.7)
Neutrophils Relative %: 33 %
Platelet Count: 401 10*3/uL — ABNORMAL HIGH (ref 150–400)
RBC: 4.06 MIL/uL — ABNORMAL LOW (ref 4.22–5.81)
RDW: 16 % — ABNORMAL HIGH (ref 11.5–15.5)
WBC Count: 5.2 10*3/uL (ref 4.0–10.5)
nRBC: 0 % (ref 0.0–0.2)

## 2020-11-26 LAB — CMP (CANCER CENTER ONLY)
ALT: 19 U/L (ref 0–44)
AST: 15 U/L (ref 15–41)
Albumin: 3.3 g/dL — ABNORMAL LOW (ref 3.5–5.0)
Alkaline Phosphatase: 101 U/L (ref 38–126)
Anion gap: 3 — ABNORMAL LOW (ref 5–15)
BUN: 11 mg/dL (ref 8–23)
CO2: 32 mmol/L (ref 22–32)
Calcium: 10.8 mg/dL — ABNORMAL HIGH (ref 8.9–10.3)
Chloride: 105 mmol/L (ref 98–111)
Creatinine: 0.79 mg/dL (ref 0.61–1.24)
GFR, Estimated: >60 mL/min (ref >60)
Glucose, Bld: 105 mg/dL — ABNORMAL HIGH (ref 70–99)
Potassium: 4.1 mmol/L (ref 3.5–5.1)
Sodium: 140 mmol/L (ref 135–145)
Total Bilirubin: 1 mg/dL (ref 0.3–1.2)
Total Protein: 7.9 g/dL (ref 6.5–8.1)

## 2020-11-26 LAB — FERRITIN: Ferritin: 427 ng/mL — ABNORMAL HIGH (ref 24–336)

## 2020-11-26 LAB — CEA (IN HOUSE-CHCC): CEA (CHCC-In House): 1.81 ng/mL (ref 0.00–5.00)

## 2020-11-26 MED ORDER — SODIUM CHLORIDE 0.9 % IV SOLN
100.0000 mg/m2 | Freq: Once | INTRAVENOUS | Status: AC
  Administered 2020-11-26: 180 mg via INTRAVENOUS
  Filled 2020-11-26: qty 9

## 2020-11-26 MED ORDER — PALONOSETRON HCL INJECTION 0.25 MG/5ML
INTRAVENOUS | Status: AC
  Filled 2020-11-26: qty 5

## 2020-11-26 MED ORDER — SODIUM CHLORIDE 0.9 % IV SOLN
2400.0000 mg/m2 | INTRAVENOUS | Status: DC
  Administered 2020-11-26: 4300 mg via INTRAVENOUS
  Filled 2020-11-26: qty 86

## 2020-11-26 MED ORDER — SODIUM CHLORIDE 0.9 % IV SOLN
Freq: Once | INTRAVENOUS | Status: AC
  Filled 2020-11-26: qty 250

## 2020-11-26 MED ORDER — ATROPINE SULFATE 1 MG/ML IJ SOLN
INTRAMUSCULAR | Status: AC
  Filled 2020-11-26: qty 1

## 2020-11-26 MED ORDER — PALONOSETRON HCL INJECTION 0.25 MG/5ML
0.2500 mg | Freq: Once | INTRAVENOUS | Status: AC
  Administered 2020-11-26: 0.25 mg via INTRAVENOUS

## 2020-11-26 MED ORDER — SODIUM CHLORIDE 0.9 % IV SOLN
5.0000 mg/kg | Freq: Once | INTRAVENOUS | Status: AC
  Administered 2020-11-26: 300 mg via INTRAVENOUS
  Filled 2020-11-26: qty 12

## 2020-11-26 MED ORDER — SODIUM CHLORIDE 0.9% FLUSH
10.0000 mL | INTRAVENOUS | Status: DC | PRN
  Administered 2020-11-26: 10 mL
  Filled 2020-11-26: qty 10

## 2020-11-26 MED ORDER — PROCHLORPERAZINE MALEATE 10 MG PO TABS: 10.0000 mg | ORAL_TABLET | Freq: Four times a day (QID) | ORAL | 2 refills | Status: DC | PRN

## 2020-11-26 MED ORDER — SODIUM CHLORIDE 0.9 % IV SOLN
400.0000 mg/m2 | Freq: Once | INTRAVENOUS | Status: AC
  Administered 2020-11-26: 720 mg via INTRAVENOUS
  Filled 2020-11-26: qty 36

## 2020-11-26 MED ORDER — ATROPINE SULFATE 1 MG/ML IJ SOLN
0.5000 mg | Freq: Once | INTRAMUSCULAR | Status: AC | PRN
  Administered 2020-11-26: 0.5 mg via INTRAVENOUS

## 2020-11-26 MED ORDER — SODIUM CHLORIDE 0.9 % IV SOLN
10.0000 mg | Freq: Once | INTRAVENOUS | Status: AC
  Administered 2020-11-26: 10 mg via INTRAVENOUS
  Filled 2020-11-26: qty 10

## 2020-11-26 MED ORDER — HEPARIN SOD (PORK) LOCK FLUSH 100 UNIT/ML IV SOLN
500.0000 [IU] | Freq: Once | INTRAVENOUS | Status: DC | PRN
  Filled 2020-11-26: qty 5

## 2020-11-26 NOTE — Patient Instructions (Signed)

## 2020-11-26 NOTE — Patient Instructions (Signed)
[place] Discharge Instructions for Patients Receiving Chemotherapy  Today you received the following chemotherapy agents: Bevacizumab, leucovorin, irinotecan, 5FU  To help prevent nausea and vomiting after your treatment, we encourage you to take your nausea medication as directed.   If you develop nausea and vomiting that is not controlled by your nausea medication, call the clinic.   BELOW ARE SYMPTOMS THAT SHOULD BE REPORTED IMMEDIATELY:  *FEVER GREATER THAN 100.5 F  *CHILLS WITH OR WITHOUT FEVER  NAUSEA AND VOMITING THAT IS NOT CONTROLLED WITH YOUR NAUSEA MEDICATION  *UNUSUAL SHORTNESS OF BREATH  *UNUSUAL BRUISING OR BLEEDING  TENDERNESS IN MOUTH AND THROAT WITH OR WITHOUT PRESENCE OF ULCERS  *URINARY PROBLEMS  *BOWEL PROBLEMS  UNUSUAL RASH Items with * indicate a potential emergency and should be followed up as soon as possible.  Feel free to call the clinic should you have any questions or concerns. The clinic phone number is [phone].  Please show the [place] at check-in to the Emergency Department and triage nurse.

## 2020-11-26 NOTE — Progress Notes (Signed)
Okay to release and administer patient's premeds with CMP pending and to administer MVASI today without urine protein per Cira Rue NP. Patient is aware that moving forward he will have to bring a urine specimen in with him prior to receiving MVASI.

## 2020-11-28 ENCOUNTER — Other Ambulatory Visit: Payer: Self-pay

## 2020-11-28 ENCOUNTER — Inpatient Hospital Stay: Payer: Medicare Other

## 2020-11-28 VITALS — BP 109/68 | HR 75 | Resp 18

## 2020-11-28 DIAGNOSIS — C182 Malignant neoplasm of ascending colon: Secondary | ICD-10-CM

## 2020-11-28 DIAGNOSIS — Z5112 Encounter for antineoplastic immunotherapy: Secondary | ICD-10-CM | POA: Diagnosis not present

## 2020-11-28 MED ORDER — HEPARIN SOD (PORK) LOCK FLUSH 100 UNIT/ML IV SOLN
500.0000 [IU] | Freq: Once | INTRAVENOUS | Status: AC | PRN
  Administered 2020-11-28: 500 [IU]
  Filled 2020-11-28: qty 5

## 2020-11-28 MED ORDER — SODIUM CHLORIDE 0.9% FLUSH
10.0000 mL | INTRAVENOUS | Status: DC | PRN
  Administered 2020-11-28: 10 mL
  Filled 2020-11-28: qty 10

## 2020-12-01 ENCOUNTER — Telehealth: Payer: Self-pay | Admitting: Nurse Practitioner

## 2020-12-01 NOTE — Telephone Encounter (Signed)
Scheduled appointments per 12/15 los. Called patient, no answer. Left message for patient with 12/29 and 1/11 appointments times.

## 2020-12-08 ENCOUNTER — Ambulatory Visit: Payer: Medicare Other | Admitting: Nurse Practitioner

## 2020-12-08 ENCOUNTER — Other Ambulatory Visit: Payer: Medicare Other

## 2020-12-08 ENCOUNTER — Ambulatory Visit: Payer: Medicare Other

## 2020-12-10 ENCOUNTER — Other Ambulatory Visit: Payer: Self-pay

## 2020-12-10 ENCOUNTER — Inpatient Hospital Stay: Payer: Medicare Other

## 2020-12-10 ENCOUNTER — Other Ambulatory Visit: Payer: Self-pay | Admitting: Internal Medicine

## 2020-12-10 ENCOUNTER — Inpatient Hospital Stay (HOSPITAL_BASED_OUTPATIENT_CLINIC_OR_DEPARTMENT_OTHER): Payer: Medicare Other | Admitting: Medical

## 2020-12-10 ENCOUNTER — Other Ambulatory Visit: Payer: Self-pay | Admitting: Oncology

## 2020-12-10 VITALS — BP 111/78 | HR 75 | Temp 97.8°F | Resp 18 | Ht 66.0 in | Wt 132.0 lb

## 2020-12-10 DIAGNOSIS — R350 Frequency of micturition: Secondary | ICD-10-CM

## 2020-12-10 DIAGNOSIS — Z95828 Presence of other vascular implants and grafts: Secondary | ICD-10-CM

## 2020-12-10 DIAGNOSIS — D500 Iron deficiency anemia secondary to blood loss (chronic): Secondary | ICD-10-CM

## 2020-12-10 DIAGNOSIS — C182 Malignant neoplasm of ascending colon: Secondary | ICD-10-CM

## 2020-12-10 DIAGNOSIS — R809 Proteinuria, unspecified: Secondary | ICD-10-CM

## 2020-12-10 DIAGNOSIS — Z5112 Encounter for antineoplastic immunotherapy: Secondary | ICD-10-CM | POA: Diagnosis not present

## 2020-12-10 LAB — CBC WITH DIFFERENTIAL (CANCER CENTER ONLY)
Abs Immature Granulocytes: 0.02 10*3/uL (ref 0.00–0.07)
Basophils Absolute: 0.1 10*3/uL (ref 0.0–0.1)
Basophils Relative: 1 %
Eosinophils Absolute: 0.1 10*3/uL (ref 0.0–0.5)
Eosinophils Relative: 2 %
HCT: 32.8 % — ABNORMAL LOW (ref 39.0–52.0)
Hemoglobin: 10.9 g/dL — ABNORMAL LOW (ref 13.0–17.0)
Immature Granulocytes: 0 %
Lymphocytes Relative: 40 %
Lymphs Abs: 2.5 10*3/uL (ref 0.7–4.0)
MCH: 28.8 pg (ref 26.0–34.0)
MCHC: 33.2 g/dL (ref 30.0–36.0)
MCV: 86.5 fL (ref 80.0–100.0)
Monocytes Absolute: 1 10*3/uL (ref 0.1–1.0)
Monocytes Relative: 15 %
Neutro Abs: 2.7 10*3/uL (ref 1.7–7.7)
Neutrophils Relative %: 42 %
Platelet Count: 326 10*3/uL (ref 150–400)
RBC: 3.79 MIL/uL — ABNORMAL LOW (ref 4.22–5.81)
RDW: 16.2 % — ABNORMAL HIGH (ref 11.5–15.5)
WBC Count: 6.3 10*3/uL (ref 4.0–10.5)
nRBC: 0 % (ref 0.0–0.2)

## 2020-12-10 LAB — CMP (CANCER CENTER ONLY)
ALT: 37 U/L (ref 0–44)
AST: 24 U/L (ref 15–41)
Albumin: 3 g/dL — ABNORMAL LOW (ref 3.5–5.0)
Alkaline Phosphatase: 107 U/L (ref 38–126)
Anion gap: 5 (ref 5–15)
BUN: 8 mg/dL (ref 8–23)
CO2: 28 mmol/L (ref 22–32)
Calcium: 9.1 mg/dL (ref 8.9–10.3)
Chloride: 104 mmol/L (ref 98–111)
Creatinine: 0.75 mg/dL (ref 0.61–1.24)
GFR, Estimated: >60 mL/min (ref >60)
Glucose, Bld: 124 mg/dL — ABNORMAL HIGH (ref 70–99)
Potassium: 3.7 mmol/L (ref 3.5–5.1)
Sodium: 137 mmol/L (ref 135–145)
Total Bilirubin: 1.3 mg/dL — ABNORMAL HIGH (ref 0.3–1.2)
Total Protein: 6.7 g/dL (ref 6.5–8.1)

## 2020-12-10 LAB — URINALYSIS, COMPLETE (UACMP) WITH MICROSCOPIC
Bilirubin Urine: NEGATIVE
Glucose, UA: NEGATIVE mg/dL
Hgb urine dipstick: NEGATIVE
Ketones, ur: NEGATIVE mg/dL
Nitrite: POSITIVE — AB
Protein, ur: 30 mg/dL — AB
RBC / HPF: >50 RBC/hpf — ABNORMAL HIGH (ref 0–5)
Specific Gravity, Urine: 1.016 (ref 1.005–1.030)
WBC, UA: >50 WBC/hpf — ABNORMAL HIGH (ref 0–5)
pH: 6 (ref 5.0–8.0)

## 2020-12-10 LAB — TOTAL PROTEIN, URINE DIPSTICK: Protein, ur: 30 mg/dL — AB

## 2020-12-10 MED ORDER — SODIUM CHLORIDE 0.9 % IV SOLN
100.0000 mg/m2 | Freq: Once | INTRAVENOUS | Status: AC
  Administered 2020-12-10: 180 mg via INTRAVENOUS
  Filled 2020-12-10: qty 9

## 2020-12-10 MED ORDER — ATROPINE SULFATE 1 MG/ML IJ SOLN
INTRAMUSCULAR | Status: AC
  Filled 2020-12-10: qty 1

## 2020-12-10 MED ORDER — ATROPINE SULFATE 1 MG/ML IJ SOLN
0.5000 mg | Freq: Once | INTRAMUSCULAR | Status: AC | PRN
  Administered 2020-12-10: 0.5 mg via INTRAVENOUS

## 2020-12-10 MED ORDER — SODIUM CHLORIDE 0.9 % IV SOLN
10.0000 mg | Freq: Once | INTRAVENOUS | Status: AC
  Administered 2020-12-10: 10 mg via INTRAVENOUS
  Filled 2020-12-10: qty 10

## 2020-12-10 MED ORDER — SODIUM CHLORIDE 0.9% FLUSH
10.0000 mL | INTRAVENOUS | Status: DC | PRN
  Administered 2020-12-10: 10 mL
  Filled 2020-12-10: qty 10

## 2020-12-10 MED ORDER — SODIUM CHLORIDE 0.9 % IV SOLN
2400.0000 mg/m2 | INTRAVENOUS | Status: DC
  Administered 2020-12-10: 4300 mg via INTRAVENOUS
  Filled 2020-12-10: qty 86

## 2020-12-10 MED ORDER — SODIUM CHLORIDE 0.9 % IV SOLN
Freq: Once | INTRAVENOUS | Status: AC
  Filled 2020-12-10: qty 250

## 2020-12-10 MED ORDER — CIPROFLOXACIN HCL 500 MG PO TABS: 500.0000 mg | ORAL_TABLET | Freq: Two times a day (BID) | ORAL | 0 refills | Status: DC

## 2020-12-10 MED ORDER — SODIUM CHLORIDE 0.9 % IV SOLN
400.0000 mg/m2 | Freq: Once | INTRAVENOUS | Status: AC
  Administered 2020-12-10: 720 mg via INTRAVENOUS
  Filled 2020-12-10: qty 36

## 2020-12-10 MED ORDER — SODIUM CHLORIDE 0.9 % IV SOLN
5.0000 mg/kg | Freq: Once | INTRAVENOUS | Status: AC
  Administered 2020-12-10: 300 mg via INTRAVENOUS
  Filled 2020-12-10: qty 12

## 2020-12-10 MED ORDER — PALONOSETRON HCL INJECTION 0.25 MG/5ML
INTRAVENOUS | Status: AC
  Filled 2020-12-10: qty 5

## 2020-12-10 MED ORDER — PALONOSETRON HCL INJECTION 0.25 MG/5ML
0.2500 mg | Freq: Once | INTRAVENOUS | Status: AC
  Administered 2020-12-10: 0.25 mg via INTRAVENOUS

## 2020-12-10 NOTE — Progress Notes (Signed)
OK to treat.  Arvie Villarruel, MHS, PA-C Physician Assistant 

## 2020-12-10 NOTE — Patient Instructions (Signed)
[facility] Discharge Instructions for Patients Receiving Chemotherapy  Today you received the following chemotherapy agents: bevacizumab, leucovorin, irinotecan, 5FU  To help prevent nausea and vomiting after your treatment, we encourage you to take your nausea medication as directed.   If you develop nausea and vomiting that is not controlled by your nausea medication, call the clinic.   BELOW ARE SYMPTOMS THAT SHOULD BE REPORTED IMMEDIATELY:  *FEVER GREATER THAN 100.5 F  *CHILLS WITH OR WITHOUT FEVER  NAUSEA AND VOMITING THAT IS NOT CONTROLLED WITH YOUR NAUSEA MEDICATION  *UNUSUAL SHORTNESS OF BREATH  *UNUSUAL BRUISING OR BLEEDING  TENDERNESS IN MOUTH AND THROAT WITH OR WITHOUT PRESENCE OF ULCERS  *URINARY PROBLEMS  *BOWEL PROBLEMS  UNUSUAL RASH Items with * indicate a potential emergency and should be followed up as soon as possible.  Feel free to call the clinic should you have any questions or concerns. The clinic phone number is [phone].  Please show the CHEMO ALERT CARD at check-in to the Emergency Department and triage nurse.   

## 2020-12-10 NOTE — Progress Notes (Signed)
[facility] Progress Note   Nathaniel Norton [id number] September 29, 1951 69 y.o.  Nathaniel Norton is managed by Dr. Malachy Mood  Actively treated with chemotherapy/immunotherapy/hormonal therapy: yes  Current therapy: FOLFIRI andbevacizumab  Last treated: 11/26/2020 (cycle 5, day 1)  Next scheduled appointment with provider: 12/23/2020  Assessment: Plan:    Urinary frequency - Plan: Urine Culture, Urinalysis, Complete w Microscopic  Cancer of right colon (HCC)   Urinary frequency: A urinalysis was completed which returned showing evidence of urinary tract infection.  The patient was given a prescription for Cipro.  Additionally a urine culture was ordered and is pending.  Metastatic carcinoma of the right colon: [name] presents to the clinic today for consideration of cycle 6, day 1 of FOLFIRI and bevacizumab.  He will have restaging CT scans completed and will return to see Dr. Mosetta Putt on 12/23/2020.  Please see After Visit Summary for patient specific instructions.  Future Appointments  Date Time Provider Department Center  12/12/2020 12:30 PM [facility] FLUSH [facility] None  12/19/2020  1:00 PM [facility] 2 [facility] [place]  12/23/2020  9:15 AM [facility] LAB [facility] None  12/23/2020  9:30 AM [facility] FLUSH [facility] None  12/23/2020 10:00 AM Malachy Mood, MD [facility] None  12/23/2020 11:00 AM [facility] INFUSION [facility] None  12/25/2020 12:15 PM [facility] FLUSH [facility] None    Orders Placed This Encounter  Procedures  . Urine Culture  . Urinalysis, Complete w Microscopic       Subjective:   Patient ID:  Nathaniel Norton is a 69 y.o. (DOB 04/27/1951) male.  Chief Complaint: No chief complaint on file.   HPI Nathaniel Norton  is a 69 y.o. male with a diagnosis of a metastatic carcinoma of the right colon.  He presents to the clinic today for consideration of cycle 6 of FOLFIRI and Avastin.  He presents a very insightful and  descriptive report of his health since his last chemotherapy treatment.  He reports that he has done no better despite the fact that his chemotherapy dose has been reduced.  He has urinary frequency and dysuria.  He reports that he is up multiple times throughout the evening urinating.  He reports that often times he will urinate then get up from the toilet and then feel the need to urinate again.  He is not drinking much fluid because of his urinary frequency.  [facility] is scheduled to see [facility] on 12/29/2020.  He reports seeing bubbles and feeling a sensation of air passing through his urethra when he urinates.  He feels that this area is "from his bladder".  He also reports that he has had diarrhea following chemotherapy which has been reduced with Imodium.  He continues to have multiple small bowel movements which he described as being either Tootsie Roll in appearance or like mini [employer].  Despite noticing a decrease in diarrhea with his use of Imodium he continues to have a sensation of an urge to have a bowel movement.   Medications: I have reviewed the patient's current medications.  Allergies:  Allergies  Allergen Reactions  . Feraheme [Ferumoxytol] Other (See Comments)    infusion reaction during 2nd dose IV Feraheme 05/16/19 with flushing, cough, nausea, stomach cramps, and difficulty breathing. He received IV benadryl, solu-medrol, and zofran. Symptoms resolved.   . Codeine Nausea Only    Past Medical History:  Diagnosis Date  . colon ca dx'd 03/2019    Past Surgical History:  Procedure Laterality Date  .  CATARACT EXTRACTION    . IR IMAGING GUIDED PORT INSERTION  05/10/2019  . L4 fracture  2012  . RETINAL DETACHMENT SURGERY    . Right hand surgery  1974    Family History  Problem Relation Age of Onset  . Coronary artery disease Father 63  . Stroke Mother 36    Social History   Socioeconomic History  . Marital status: Single    Spouse name: Not on file  .  Number of children: Not on file  . Years of education: Not on file  . Highest education level: Not on file  Occupational History  . Occupation: retired   Tobacco Use  . Smoking status: Never Smoker  . Smokeless tobacco: Never Used  Substance and Sexual Activity  . Alcohol use: Not on file    Comment: no   . Drug use: Never  . Sexual activity: Not on file  Other Topics Concern  . Not on file  Social History Narrative   He lives by himself, not married, no children. 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   Social Determinants of Health   Financial Resource Strain: Not on file  Food Insecurity: Not on file  Transportation Needs: Not on file  Physical Activity: Not on file  Stress: Not on file  Social Connections: Not on file  Intimate Partner Violence: Not on file    Past Medical History, Surgical history, Social history, and Family history were reviewed and updated as appropriate.   Please see review of systems for further details on the patient's review from today.   Review of Systems:  Review of Systems  Constitutional: Positive for activity change, appetite change and fatigue. Negative for chills, diaphoresis and fever.  HENT: Negative for trouble swallowing.   Respiratory: Negative for cough, chest tightness and shortness of breath.   Cardiovascular: Negative for chest pain, palpitations and leg swelling.  Gastrointestinal: Positive for diarrhea. Negative for abdominal distention, abdominal pain, blood in stool, constipation, nausea and vomiting.  Genitourinary: Positive for dysuria and frequency. Negative for decreased urine volume and difficulty urinating.  Musculoskeletal: Positive for back pain.  Neurological: Negative for weakness.    Objective:   Physical Exam:  BP 111/78 (BP Location: Right Arm, Patient Position: Sitting)   Pulse 75   Temp 97.8 F (36.6 C) (Tympanic)   Resp 18   Ht 5\' 6"  (1.676 m)   Wt 132 lb (59.9 kg)   SpO2  99%   BMI 21.31 kg/m  ECOG: 1  Physical Exam Constitutional:      General: He is not in acute distress.    Appearance: He is not diaphoretic.  HENT:     Head: Normocephalic and atraumatic.  Eyes:     General: No scleral icterus.       Right eye: No discharge.        Left eye: No discharge.  Cardiovascular:     Rate and Rhythm: Normal rate and regular rhythm.     Heart sounds: Normal heart sounds. No murmur heard. No friction rub. No gallop.   Pulmonary:     Effort: Pulmonary effort is normal. No respiratory distress.     Breath sounds: Normal breath sounds. No wheezing or rales.  Abdominal:     General: Abdomen is flat. Bowel sounds are normal.     Palpations: Abdomen is soft.    Musculoskeletal:     Right lower leg: No edema.     Left lower leg: No  edema.  Skin:    General: Skin is warm and dry.     Findings: No erythema or rash.       Neurological:     Mental Status: He is alert.     Coordination: Coordination normal.     Gait: Gait normal.     Lab Review:     Component Value Date/Time   NA 137 12/10/2020 0856   NA 144 12/24/2013 1441   K 3.7 12/10/2020 0856   CL 104 12/10/2020 0856   CO2 28 12/10/2020 0856   GLUCOSE 124 (H) 12/10/2020 0856   BUN 8 12/10/2020 0856   BUN 22 12/24/2013 1441   CREATININE 0.75 12/10/2020 0856   CALCIUM 9.1 12/10/2020 0856   PROT 6.7 12/10/2020 0856   PROT 7.5 12/24/2013 1441   ALBUMIN 3.0 (L) 12/10/2020 0856   ALBUMIN 4.8 12/24/2013 1441   AST 24 12/10/2020 0856   ALT 37 12/10/2020 0856   ALKPHOS 107 12/10/2020 0856   BILITOT 1.3 (H) 12/10/2020 0856   GFRNONAA >60 12/10/2020 0856   GFRAA >60 09/05/2020 0925       Component Value Date/Time   WBC 6.3 12/10/2020 0856   WBC 5.8 05/10/2019 1314   RBC 3.79 (L) 12/10/2020 0856   HGB 10.9 (L) 12/10/2020 0856   HCT 32.8 (L) 12/10/2020 0856   PLT 326 12/10/2020 0856   MCV 86.5 12/10/2020 0856   MCH 28.8 12/10/2020 0856   MCHC 33.2 12/10/2020 0856   RDW 16.2 (H)  12/10/2020 0856   RDW 15.1 12/24/2013 1441   LYMPHSABS 2.5 12/10/2020 0856   LYMPHSABS 0.8 12/24/2013 1441   MONOABS 1.0 12/10/2020 0856   EOSABS 0.1 12/10/2020 0856   EOSABS 0.0 12/24/2013 1441   BASOSABS 0.1 12/10/2020 0856   BASOSABS 0.0 12/24/2013 1441   -------------------------------  Imaging from last 24 hours (if applicable):  Radiology interpretation: No results found.

## 2020-12-12 ENCOUNTER — Inpatient Hospital Stay: Payer: Medicare Other

## 2020-12-12 ENCOUNTER — Other Ambulatory Visit: Payer: Self-pay

## 2020-12-12 VITALS — BP 126/79 | HR 95 | Temp 97.2°F | Resp 16

## 2020-12-12 DIAGNOSIS — C182 Malignant neoplasm of ascending colon: Secondary | ICD-10-CM

## 2020-12-12 DIAGNOSIS — Z5112 Encounter for antineoplastic immunotherapy: Secondary | ICD-10-CM | POA: Diagnosis not present

## 2020-12-12 LAB — URINE CULTURE

## 2020-12-12 MED ORDER — HEPARIN SOD (PORK) LOCK FLUSH 100 UNIT/ML IV SOLN
500.0000 [IU] | Freq: Once | INTRAVENOUS | Status: AC | PRN
  Administered 2020-12-12: 500 [IU]
  Filled 2020-12-12: qty 5

## 2020-12-12 MED ORDER — SODIUM CHLORIDE 0.9% FLUSH
10.0000 mL | INTRAVENOUS | Status: DC | PRN
  Administered 2020-12-12: 10 mL
  Filled 2020-12-12: qty 10

## 2020-12-19 ENCOUNTER — Other Ambulatory Visit: Payer: Self-pay

## 2020-12-19 ENCOUNTER — Encounter (HOSPITAL_COMMUNITY): Payer: Self-pay

## 2020-12-19 ENCOUNTER — Ambulatory Visit (HOSPITAL_COMMUNITY)
Admission: RE | Admit: 2020-12-19 | Discharge: 2020-12-19 | Disposition: A | Discharge status: Home / Self Care | Payer: Medicare Other | Source: Ambulatory Visit | Attending: Nurse Practitioner | Admitting: Nurse Practitioner

## 2020-12-19 DIAGNOSIS — C182 Malignant neoplasm of ascending colon: Secondary | ICD-10-CM | POA: Diagnosis not present

## 2020-12-19 DIAGNOSIS — N200 Calculus of kidney: Secondary | ICD-10-CM | POA: Diagnosis not present

## 2020-12-19 DIAGNOSIS — I700 Atherosclerosis of aorta: Secondary | ICD-10-CM | POA: Diagnosis not present

## 2020-12-19 DIAGNOSIS — M810 Age-related osteoporosis without current pathological fracture: Secondary | ICD-10-CM | POA: Diagnosis not present

## 2020-12-19 DIAGNOSIS — C189 Malignant neoplasm of colon, unspecified: Secondary | ICD-10-CM | POA: Diagnosis not present

## 2020-12-19 DIAGNOSIS — N281 Cyst of kidney, acquired: Secondary | ICD-10-CM | POA: Diagnosis not present

## 2020-12-19 DIAGNOSIS — I2510 Atherosclerotic heart disease of native coronary artery without angina pectoris: Secondary | ICD-10-CM | POA: Diagnosis not present

## 2020-12-19 DIAGNOSIS — J8410 Pulmonary fibrosis, unspecified: Secondary | ICD-10-CM | POA: Diagnosis not present

## 2020-12-19 DIAGNOSIS — K7689 Other specified diseases of liver: Secondary | ICD-10-CM | POA: Diagnosis not present

## 2020-12-19 MED ORDER — HEPARIN SOD (PORK) LOCK FLUSH 100 UNIT/ML IV SOLN
INTRAVENOUS | Status: AC
  Filled 2020-12-19: qty 5

## 2020-12-19 MED ORDER — HEPARIN SOD (PORK) LOCK FLUSH 100 UNIT/ML IV SOLN
500.0000 [IU] | Freq: Once | INTRAVENOUS | Status: AC
  Administered 2020-12-19: 500 [IU] via INTRAVENOUS

## 2020-12-19 MED ORDER — IOHEXOL 300 MG/ML  SOLN
100.0000 mL | Freq: Once | INTRAMUSCULAR | Status: AC | PRN
  Administered 2020-12-19: 75 mL via INTRAVENOUS

## 2020-12-22 MED ORDER — CIPROFLOXACIN HCL 500 MG PO TABS: 500.0000 mg | ORAL_TABLET | Freq: Two times a day (BID) | ORAL | 0 refills | Status: DC

## 2020-12-22 MED ORDER — METRONIDAZOLE 500 MG PO TABS: 500.0000 mg | ORAL_TABLET | Freq: Three times a day (TID) | ORAL | 0 refills | Status: AC

## 2020-12-22 NOTE — Progress Notes (Signed)
Nathaniel Norton   Telephone:([phone] Fax:([phone]) [phone]   Clinic Follow up Note   Patient Care Team: Lajean Manes, MD as PCP - General (Internal Medicine) Berle Mull, MD as Consulting Physician (Family Medicine) Clarene Essex, MD as Consulting Physician (Gastroenterology)  Date of Service:  12/23/2020  CHIEF COMPLAINT: F/u ofmetastaticcolon cancer  SUMMARY OF ONCOLOGIC HISTORY: Oncology History Overview Note  Cancer Staging Cancer of right colon [facility]) Staging form: Colon and Rectum, AJCC 8th Edition - Clinical stage from 04/09/2019: Stage IVC (cTX, cNX, pM1c) - Signed by Truitt Merle, MD on 05/03/2019     Cancer of right colon [facility])  04/09/2019 Procedure   Colonoscopy 04/09/19 by Dr Watt Climes IMPRESSION -internal hemorrhoids -Diverticulosis in the sigmoid colon  -2 small polyps in the rectum and in the proximal transverse colon, removed with a hot snare. Resected and retrieved.  -3 medium polyps in the proximal transverse colon, in the mid transverse colon and in the distal transverse colon, removed and resected and retrieved.  -likely malignant partially obstructing tumor at the hepatic flexure. biopsied, tattooed.  -1 large polyp in the mid ascending colon  -the examination was otherwise normal     04/09/2019 Initial Biopsy   FINAL MICROSCOPIC DIAGNOSIS: 04/09/19 1. LG intestine-hepatic flexure, Biopsy:   INVASIVE WELL DIFFERENTIATED ADENOCARCINOMA   04/09/2019 Cancer Staging   Staging form: Colon and Rectum, AJCC 8th Edition - Clinical stage from 04/09/2019: Stage IVC (cTX, cNX, pM1c) - Signed by Truitt Merle, MD on 05/03/2019   04/10/2019 Imaging   CT AP 04/10/19  IMPRESSION: 1. There is an eccentric mass of the colon involving the ascending colon near the hepatic flexure measuring approximately 3.4 x 3.4 by 2.0 cm (series 2, image 37, series 3, image 37). There is extensive soft tissue nodularity of the mesocolon and omentum, and likely areas of the  peritoneum, for example bilateral upper quadrants (series 2, image 25). Findings are consistent with primary colon malignancy, probable omental and peritoneal involvement, and small volume malignant ascites. 2.  Other chronic and incidental findings as detailed above.   04/20/2019 Initial Diagnosis   Cancer of right colon ([place])   04/26/2019 Imaging   CT Chest 04/26/19 IMPRESSION: 1. Borderline to mild lower thoracic adenopathy, including within the right internal mammary and juxta cardiophrenic stations. Given the appearance of the upper abdomen, suspicious for nodal metastasis. 2. A low right paratracheal node is borderline sized, but favored to be reactive. 3. No evidence of pulmonary metastasis. 4. Peritoneal metastasis and abdominal ascites, as before. 5. Pancreatic parenchymal calcifications indicative of chronic calcific pancreatitis. 6. Coronary artery atherosclerosis.   04/27/2019 Pathology Results   Diagnosis 04/27/19 Peritoneum, biopsy, right upper quadrant, perihepatic - ADENOCARCINOMA, CONSISTENT WITH COLONIC PRIMARY. - SEE COMMENT.   05/16/2019 - 10/31/2019 Chemotherapy   First line FOLFOX every 2 weeks with Avastin starting with cycle 2 starting 05/16/19. 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 Stopped after 10/31/19 for surgery.    07/23/2019 Imaging    CT AP W Contrast  IMPRESSION: 1. Primary ascending colon mass may be minimally smaller. Peritoneal metastatic disease appears slightly improved as well. 2. Chronic calcific pancreatitis. 3. Tiny left renal stone. 4. Small left inguinal hernia contains a knuckle of unobstructed colon. 5. Enlarged prostate.   10/29/2019 Imaging   CT AP W Contrast IMPRESSION: No significant change in small ascending colon soft tissue  mass and diffuse omental  carcinomatosis.   No new or progressive metastatic disease identified.   Stable small left inguinal hernia containing a loop of sigmoid colon. No evidence of bowel obstruction or strangulation.   Colonic diverticulosis. No radiographic evidence of diverticulitis.   Stable enlarged prostate.   12/17/2019 Pathology Results   HIPEC Surgery by Dr Clovis Riley 12/17/19 FINAL PATHOLOGIC DIAGNOSIS  MICROSCOPIC EXAMINATION AND DIAGNOSIS  A.          "RIGHT COLON, OMENTUM, SPLEEN":       Invasive adenocarcinoma with mucinous features, moderately differentiated.  Tumor involves mesentery and spleen.  Five out of ten lymph nodes involved by adenocarcinoma with perinodal soft tissue involvement (5/10), see comment.  Margins are uninvolved by invasive carcinoma.  Ileum and appendix, uninvolved.  See comment and cancer case summary.  B.          "ROUND LIGAMENT OF LIVER":       Involved by adenocarcinoma with mucinous features, moderately differentiated.  C.          "RIGHT DIAPHRAM STRIPPING":       Negative for carcinoma.  D.          "RIGHT HEPATIC CAPSULECTOMY":       Chronic inflammation and fibrosis.  Negative for carcinoma.  E.          "DEBRIDED TUMOR":       Involved by adenocarcinoma with mucinous features, moderately differentiated.   COMMENT: Sections disclose five out of ten lymph nodes involved by adenocarcinoma with perinodal soft tissue involvement. However, it is not possible to be certain if this represents lymph nodes replaced by tumor and/or soft tissue involvement. In addition, focal perineural invasion is seen.   04/03/2020 Imaging   CT AP W contrast  IMPRESSION: 1. Status post interval splenectomy and right hemicolectomy. 2. Response to therapy of omental/peritoneal metastasis. The only residual indeterminate finding is fluid density along the capsule of the hepatic dome which could be postoperative or secondary to localized residual peritoneal disease. 3. Left  nephrolithiasis. 4.  Possible constipation. 5. Prostatomegaly. 6.  Aortic Atherosclerosis (ICD10-I70.0).   07/11/2020 Imaging   CT AP  1.  Postsurgical changes of subtle reductive surgery including right hemicolectomy, omentectomy, splenectomy, and right hepatic capsulectomy.  2.  Interval development of multiple areas of low attenuation involving the liver, as described above. This the intraparenchymal low-density lesion is concerning for metastatic disease. Other disease along the capsule may be post therapy related. Residual thickening of the anterior peritoneum particularly anterior to the right lobe of the liver is also concerning for some residual disease although appears significantly improved. Recommend attention on follow-up.  3.  No definite evidence of residual peritoneal disease status post HIPEC.   09/18/2020 -  Chemotherapy   Second line chemo FOLFIRI and bevacizumab q2weeks starting 09/18/20   12/19/2020 Imaging   CT CAP  IMPRESSION: 1. Interval development of a 1.6 x 2.7 x 1.6 cm collection of gas and debris between the sigmoid colon and dome of bladder, compatible with small abscess. A portion of this abscess is probably intramural at the dome of bladder. Marked associated thickening of the adjacent colonic and bladder walls with tiny gas bubbles in the thickened bladder wall and prominent amount of free gas in the bladder lumen suggests an associated colovesical fistula. Tiny gas bubbles are also seen in the central prostate gland and may be in the urethra although parenchymal gas within the prostate gland is not excluded on this exam.  2. Nodularity along the major and minor fissures of the right hemithorax has decreased in the interval, nearly imperceptible today. 3. Peritoneal implants along the liver, right abdomen, and deep to the left paramidline anterior abdominal wall have resolved in the interval by CT imaging. 4. Tiny right lower lobe pulmonary nodule is more  conspicuous today. Attention on follow-up recommended. 5. Stable 4 mm hypodensity in the dome of the right liver, too small to characterize. 6. Nonobstructing left renal stones with bilateral renal cysts. 7. Stable compression deformity at multiple thoracic and lumbar levels. 8. Aortic Atherosclerosis (ICD10-I70.0).      CURRENT THERAPY:  Second line chemo FOLFIRI andbevacizumabq2weeks starting 09/18/20  INTERVAL HISTORY:  Nathaniel Norton is here for a follow up and treatment. He presents to the clinic alone.I discussed his CT scan findings with him yesterday, and according Cipro and Flagyl.  He has been having intermittent dysuria, bladder discomfort, discoloration of his urine for the past months, was treated for UTI on last visit by NP Lucianne Lei.  Cipro did help, but he had recurrent symptoms after he finished the course of antibiotics.  He denies any fever, chills, or other new symptoms.  He has mild fatigue, but able to function very well.  Bowel movement has been normal lately.  He denies diarrhea or hematochezia.  All other systems were reviewed with the patient and are negative.  MEDICAL HISTORY:  Past Medical History:  Diagnosis Date  . colon ca dx'd 03/2019    SURGICAL HISTORY: Past Surgical History:  Procedure Laterality Date  . CATARACT EXTRACTION    . IR IMAGING GUIDED PORT INSERTION  05/10/2019  . L4 fracture  2012  . RETINAL DETACHMENT SURGERY    . Right hand surgery  1974    I have reviewed the social history and family history with the patient and they are unchanged from previous note.  ALLERGIES:  is allergic to feraheme [ferumoxytol] and codeine.  MEDICATIONS:  Current Outpatient Medications  Medication Sig Dispense Refill  . metroNIDAZOLE (FLAGYL) 500 MG tablet Take 1 tablet (500 mg total) by mouth 3 (three) times daily for 7 days. 21 tablet 0  . ciprofloxacin (CIPRO) 500 MG tablet Take 1 tablet (500 mg total) by mouth 2 (two) times daily. 14 tablet 0  .  cyclobenzaprine (FLEXERIL) 5 MG tablet TAKE 1 TABLET BY MOUTH 3 TIMES DAILY AS NEEDED FOR UP TO 5 DAYS FOR MUSCLE SPASMS (MUSCULAR PAIN).    Marland Kitchen diphenoxylate-atropine (LOMOTIL) 2.5-0.025 MG tablet Take 1-2 tablets by mouth 4 (four) times daily as needed for diarrhea or loose stools. 90 tablet 1  . ferrous sulfate 325 (65 FE) MG tablet Take 325 mg by mouth daily with breakfast.    . fluticasone (FLONASE) 50 MCG/ACT nasal spray Place into the nose.    . mirtazapine (REMERON) 7.5 MG tablet Take 1 tablet (7.5 mg total) by mouth at bedtime. 30 tablet 1  . Multiple Vitamin (MULTIVITAMIN) tablet Take 1 tablet by mouth daily.    . ondansetron (ZOFRAN) 8 MG tablet Take 1 tablet (8 mg total) by mouth every 8 (eight) hours as needed for nausea or vomiting. Start on day 3 after chemotherapy 20 tablet 0  . potassium chloride SA (KLOR-CON) 20 MEQ tablet Take 1 tablet (20 mEq total) by mouth 2 (two) times daily. 40 tablet 1  . prochlorperazine (COMPAZINE) 10 MG tablet Take 1 tablet (10 mg total) by mouth every 6 (six) hours as needed (Nausea or vomiting). 30 tablet 2  No current facility-administered medications for this visit.    PHYSICAL EXAMINATION: ECOG PERFORMANCE STATUS: 2 - Symptomatic, <50% confined to bed  Vitals:   12/23/20 1251  BP: 126/79  Pulse: 80  Resp: 17  Temp: (!) 97.2 F (36.2 C)  SpO2: 100%   Filed Weights   12/23/20 1251  Weight: 133 lb 3.2 oz (60.4 kg)    GENERAL:alert, no distress and comfortable SKIN: skin color, texture, turgor are normal, no rashes or significant lesions EYES: normal, Conjunctiva are pink and non-injected, sclera clear NECK: supple, thyroid normal size, non-tender, without nodularity LYMPH:  no palpable lymphadenopathy in the cervical, axillary  LUNGS: clear to auscultation and percussion with normal breathing effort HEART: regular rate & rhythm and no murmurs and no lower extremity edema ABDOMEN:abdomen soft, mild tenderness in suprapubic area.  Bowel  sounds normal. Musculoskeletal:no cyanosis of digits and no clubbing  NEURO: alert & oriented x 3 with fluent speech, no focal motor/sensory deficits  LABORATORY DATA:  I have reviewed the data as listed CBC Latest Ref Rng & Units 12/23/2020 12/10/2020 11/26/2020  WBC 4.0 - 10.5 K/uL 7.1 6.3 5.2  Hemoglobin 13.0 - 17.0 g/dL 11.7(L) 10.9(L) 11.7(L)  Hematocrit 39.0 - 52.0 % 35.4(L) 32.8(L) 35.9(L)  Platelets 150 - 400 K/uL 360 326 401(H)     CMP Latest Ref Rng & Units 12/23/2020 12/10/2020 11/26/2020  Glucose 70 - 99 mg/dL 140(H) 124(H) 105(H)  BUN 8 - 23 mg/dL _0 Creatinine 0.61 - 1.24 mg/dL 0.82 0.75 0.79  Sodium 135 - 145 mmol/L 139 137 140  Potassium 3.5 - 5.1 mmol/L 3.8 3.7 4.1  Chloride 98 - 111 mmol/L 103 104 105  CO2 22 - 32 mmol/L 29 28 32  Calcium 8.9 - 10.3 mg/dL 9.2 9.1 10.8(H)  Total Protein 6.5 - 8.1 g/dL 7.0 6.7 7.9  Total Bilirubin 0.3 - 1.2 mg/dL 0.7 1.3(H) 1.0  Alkaline Phos 38 - 126 U/L 79 107 101  AST 15 - 41 U/L 11(L) 24 15  ALT 0 - 44 U/L 10 37 19      RADIOGRAPHIC STUDIES: I have personally reviewed the radiological images as listed and agreed with the findings in the report. No results found.   ASSESSMENT & PLAN:  Nathaniel Norton is a 70 y.o. male with   1. 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 Clovis Riley who will see him later this week. Although surgical resection of the fistula in colon, Dr. Clovis Riley feels this is a challenge surgery and he would need colostomy after surgery -I spoke with urologist Dr. Claudia Desanctis yesterday and she does not think she has anything to offer at this point if Dr. Clovis Riley will not offer surgery   -Pt has have a consult appointment with [facility] next week and he will keep his appointment  -pt is open to surgery if it's offered, although he does have some concerns about his QOL after surgery   1. Cancer of right colon,with peritonealand lungmetastasis,stage IV,MMR normal, KRAS mutation (+), MSS -He wasdiagnosed in 03/2019. His Colonoscopy biopsy showed adenocarcinoma of right hepatic flexurecolon.His peritoneal biopsy from 04/27/19 showsadenocarcinoma,consistent withmetastasis of his colon cancer. This is now  stage IV disease. -He was treated with first-line FOLFOX in June-November 2020 before proceeding with HIPECsurgeryby Dr. Dustin Folks 12/17/19. -Unfortunately he had disease progression on 09/10/20 PET withhypermetabolic pleural (rigt)and peritoneal nodules, which are consistent with metastatic disease.I do not think we need biopsy to confirm given his previously known peritoneal metastasis -Istarted him onsecond-line chemo withFOLFIRI and bevacizumab q2weekson 09/18/20. -I personally reviewed his restaging CT scan from December 20, 2019, which showed excellent response to chemo treatment.  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  We will hold on chemo until this resolved by surgery.  He would not be a candidate for Bevacizumab in the future.   PLAN: -CT scan reviewed  -Continue Cipro and Flagyl for 7 days, started yesterday.  He may require chronic prophylactic antibiotics such as cipro before his surgery for fistular  -he will see Dr. Clovis Riley this Friday to discuss surgery  -will hold on chemo for now  -f/u in 1-2 weeks    No problem-specific Assessment & Plan notes found for this encounter.   No orders of the defined types were placed in this encounter.  All questions were answered. The patient knows to call the clinic with any problems, questions or concerns. No barriers to learning was  detected. The total time spent in the appointment was 30 minutes.     Truitt Merle, MD 12/23/2020   I, Joslyn Devon, am acting as scribe for Truitt Merle, MD.   I have reviewed the above documentation for accuracy and completeness, and I agree with the above.

## 2020-12-23 ENCOUNTER — Inpatient Hospital Stay: Payer: Medicare Other

## 2020-12-23 ENCOUNTER — Encounter: Payer: Self-pay | Admitting: Hematology

## 2020-12-23 ENCOUNTER — Inpatient Hospital Stay (HOSPITAL_BASED_OUTPATIENT_CLINIC_OR_DEPARTMENT_OTHER): Payer: Medicare Other | Admitting: Hematology

## 2020-12-23 ENCOUNTER — Other Ambulatory Visit: Payer: Self-pay

## 2020-12-23 ENCOUNTER — Inpatient Hospital Stay: Payer: Medicare Other | Attending: Hematology

## 2020-12-23 VITALS — BP 126/79 | HR 80 | Temp 97.2°F | Resp 17 | Ht 66.0 in | Wt 133.2 lb

## 2020-12-23 DIAGNOSIS — C786 Secondary malignant neoplasm of retroperitoneum and peritoneum: Secondary | ICD-10-CM | POA: Diagnosis not present

## 2020-12-23 DIAGNOSIS — Z79899 Other long term (current) drug therapy: Secondary | ICD-10-CM | POA: Diagnosis not present

## 2020-12-23 DIAGNOSIS — K632 Fistula of intestine: Secondary | ICD-10-CM | POA: Diagnosis not present

## 2020-12-23 DIAGNOSIS — Z9081 Acquired absence of spleen: Secondary | ICD-10-CM | POA: Insufficient documentation

## 2020-12-23 DIAGNOSIS — C7800 Secondary malignant neoplasm of unspecified lung: Secondary | ICD-10-CM | POA: Diagnosis not present

## 2020-12-23 DIAGNOSIS — Z885 Allergy status to narcotic agent status: Secondary | ICD-10-CM | POA: Insufficient documentation

## 2020-12-23 DIAGNOSIS — C182 Malignant neoplasm of ascending colon: Secondary | ICD-10-CM | POA: Diagnosis not present

## 2020-12-23 DIAGNOSIS — D500 Iron deficiency anemia secondary to blood loss (chronic): Secondary | ICD-10-CM

## 2020-12-23 DIAGNOSIS — Z9049 Acquired absence of other specified parts of digestive tract: Secondary | ICD-10-CM | POA: Insufficient documentation

## 2020-12-23 DIAGNOSIS — R809 Proteinuria, unspecified: Secondary | ICD-10-CM

## 2020-12-23 DIAGNOSIS — Z95828 Presence of other vascular implants and grafts: Secondary | ICD-10-CM

## 2020-12-23 LAB — IRON AND TIBC
Iron: 64 ug/dL (ref 42–163)
Saturation Ratios: 25 % (ref 20–55)
TIBC: 260 ug/dL (ref 202–409)
UIBC: 196 ug/dL (ref 117–376)

## 2020-12-23 LAB — CBC WITH DIFFERENTIAL (CANCER CENTER ONLY)
Abs Immature Granulocytes: 0.02 10*3/uL (ref 0.00–0.07)
Basophils Absolute: 0 10*3/uL (ref 0.0–0.1)
Basophils Relative: 1 %
Eosinophils Absolute: 0.1 10*3/uL (ref 0.0–0.5)
Eosinophils Relative: 1 %
HCT: 35.4 % — ABNORMAL LOW (ref 39.0–52.0)
Hemoglobin: 11.7 g/dL — ABNORMAL LOW (ref 13.0–17.0)
Immature Granulocytes: 0 %
Lymphocytes Relative: 37 %
Lymphs Abs: 2.6 10*3/uL (ref 0.7–4.0)
MCH: 29.2 pg (ref 26.0–34.0)
MCHC: 33.1 g/dL (ref 30.0–36.0)
MCV: 88.3 fL (ref 80.0–100.0)
Monocytes Absolute: 0.9 10*3/uL (ref 0.1–1.0)
Monocytes Relative: 13 %
Neutro Abs: 3.5 10*3/uL (ref 1.7–7.7)
Neutrophils Relative %: 48 %
Platelet Count: 360 10*3/uL (ref 150–400)
RBC: 4.01 MIL/uL — ABNORMAL LOW (ref 4.22–5.81)
RDW: 16.9 % — ABNORMAL HIGH (ref 11.5–15.5)
WBC Count: 7.1 10*3/uL (ref 4.0–10.5)
nRBC: 0 % (ref 0.0–0.2)

## 2020-12-23 LAB — CMP (CANCER CENTER ONLY)
ALT: 10 U/L (ref 0–44)
AST: 11 U/L — ABNORMAL LOW (ref 15–41)
Albumin: 3.2 g/dL — ABNORMAL LOW (ref 3.5–5.0)
Alkaline Phosphatase: 79 U/L (ref 38–126)
Anion gap: 7 (ref 5–15)
BUN: 8 mg/dL (ref 8–23)
CO2: 29 mmol/L (ref 22–32)
Calcium: 9.2 mg/dL (ref 8.9–10.3)
Chloride: 103 mmol/L (ref 98–111)
Creatinine: 0.82 mg/dL (ref 0.61–1.24)
GFR, Estimated: >60 mL/min (ref >60)
Glucose, Bld: 140 mg/dL — ABNORMAL HIGH (ref 70–99)
Potassium: 3.8 mmol/L (ref 3.5–5.1)
Sodium: 139 mmol/L (ref 135–145)
Total Bilirubin: 0.7 mg/dL (ref 0.3–1.2)
Total Protein: 7 g/dL (ref 6.5–8.1)

## 2020-12-23 LAB — CEA (IN HOUSE-CHCC): CEA (CHCC-In House): 2.37 ng/mL (ref 0.00–5.00)

## 2020-12-23 LAB — FERRITIN: Ferritin: 329 ng/mL (ref 24–336)

## 2020-12-23 MED ORDER — HEPARIN SOD (PORK) LOCK FLUSH 100 UNIT/ML IV SOLN
500.0000 [IU] | Freq: Once | INTRAVENOUS | Status: AC | PRN
  Administered 2020-12-23: 500 [IU]
  Filled 2020-12-23: qty 5

## 2020-12-23 MED ORDER — SODIUM CHLORIDE 0.9% FLUSH
10.0000 mL | INTRAVENOUS | Status: DC | PRN
  Administered 2020-12-23: 10 mL
  Filled 2020-12-23: qty 10

## 2020-12-24 ENCOUNTER — Ambulatory Visit: Payer: Medicare Other | Admitting: Nurse Practitioner

## 2020-12-24 ENCOUNTER — Ambulatory Visit: Payer: Medicare Other

## 2020-12-24 ENCOUNTER — Other Ambulatory Visit: Payer: Medicare Other

## 2020-12-25 ENCOUNTER — Telehealth: Payer: Self-pay | Admitting: Hematology

## 2020-12-25 NOTE — Telephone Encounter (Signed)
Scheduled appts per 1/11 los. Pt confirmed appt date and time.  

## 2020-12-26 ENCOUNTER — Telehealth: Payer: Self-pay | Admitting: *Deleted

## 2020-12-26 DIAGNOSIS — R918 Other nonspecific abnormal finding of lung field: Secondary | ICD-10-CM | POA: Diagnosis not present

## 2020-12-26 DIAGNOSIS — C182 Malignant neoplasm of ascending colon: Secondary | ICD-10-CM | POA: Diagnosis not present

## 2020-12-26 DIAGNOSIS — C786 Secondary malignant neoplasm of retroperitoneum and peritoneum: Secondary | ICD-10-CM | POA: Diagnosis not present

## 2020-12-26 DIAGNOSIS — I700 Atherosclerosis of aorta: Secondary | ICD-10-CM | POA: Diagnosis not present

## 2020-12-26 DIAGNOSIS — N281 Cyst of kidney, acquired: Secondary | ICD-10-CM | POA: Diagnosis not present

## 2020-12-26 DIAGNOSIS — N200 Calculus of kidney: Secondary | ICD-10-CM | POA: Diagnosis not present

## 2020-12-26 DIAGNOSIS — C189 Malignant neoplasm of colon, unspecified: Secondary | ICD-10-CM | POA: Diagnosis not present

## 2020-12-26 NOTE — Telephone Encounter (Signed)
-----   Message from Alla Feeling, NP sent at 12/25/2020  2:17 PM EST ----- Please let him know blood culture negative so far, iron and tumor marker normal, no other changes on labs.  Thanks, Regan Rakers, NP

## 2020-12-26 NOTE — Telephone Encounter (Signed)
Notified of message below

## 2020-12-28 LAB — CULTURE, BLOOD (SINGLE): Culture: NO GROWTH

## 2020-12-31 NOTE — Progress Notes (Signed)
Nathaniel Norton   [phone] [phone]   Clinic Follow up Note   Patient Care Team: Lajean Manes, MD as PCP - General (Internal Medicine) Berle Mull, MD as Consulting Physician (Family Medicine) Clarene Essex, MD as Consulting Physician (Gastroenterology)  Date of Service:  01/02/2021  CHIEF COMPLAINT: F/u ofmetastaticcolon cancer  SUMMARY OF ONCOLOGIC HISTORY: Oncology History Overview Note  Cancer Staging Cancer of right colon [facility]) Staging form: Colon and Rectum, AJCC 8th Edition - Clinical stage from 04/09/2019: Stage IVC (cTX, cNX, pM1c) - Signed by Truitt Merle, MD on 05/03/2019     Cancer of right colon [facility])  04/09/2019 Procedure   Colonoscopy 04/09/19 by Dr Watt Climes IMPRESSION -internal hemorrhoids -Diverticulosis in the sigmoid colon  -2 small polyps in the rectum and in the proximal transverse colon, removed with a hot snare. Resected and retrieved.  -3 medium polyps in the proximal transverse colon, in the mid transverse colon and in the distal transverse colon, removed and resected and retrieved.  -likely malignant partially obstructing tumor at the hepatic flexure. biopsied, tattooed.  -1 large polyp in the mid ascending colon  -the examination was otherwise normal     04/09/2019 Initial Biopsy   FINAL MICROSCOPIC DIAGNOSIS: 04/09/19 1. LG intestine-hepatic flexure, Biopsy:   INVASIVE WELL DIFFERENTIATED ADENOCARCINOMA   04/09/2019 Cancer Staging   Staging form: Colon and Rectum, AJCC 8th Edition - Clinical stage from 04/09/2019: Stage IVC (cTX, cNX, pM1c) - Signed by Truitt Merle, MD on 05/03/2019   04/10/2019 Imaging   CT AP 04/10/19  IMPRESSION: 1. There is an eccentric mass of the colon involving the ascending colon near the hepatic flexure measuring approximately 3.4 x 3.4 by 2.0 cm (series 2, image 37, series 3, image 37). There is extensive soft tissue nodularity of the mesocolon and omentum, and likely areas of the  peritoneum, for example bilateral upper quadrants (series 2, image 25). Findings are consistent with primary colon malignancy, probable omental and peritoneal involvement, and small volume malignant ascites. 2.  Other chronic and incidental findings as detailed above.   04/20/2019 Initial Diagnosis   Cancer of right colon ([place])   04/26/2019 Imaging   CT Chest 04/26/19 IMPRESSION: 1. Borderline to mild lower thoracic adenopathy, including within the right internal mammary and juxta cardiophrenic stations. Given the appearance of the upper abdomen, suspicious for nodal metastasis. 2. A low right paratracheal node is borderline sized, but favored to be reactive. 3. No evidence of pulmonary metastasis. 4. Peritoneal metastasis and abdominal ascites, as before. 5. Pancreatic parenchymal calcifications indicative of chronic calcific pancreatitis. 6. Coronary artery atherosclerosis.   04/27/2019 Pathology Results   Diagnosis 04/27/19 Peritoneum, biopsy, right upper quadrant, perihepatic - ADENOCARCINOMA, CONSISTENT WITH COLONIC PRIMARY. - SEE COMMENT.   05/16/2019 - 10/31/2019 Chemotherapy   First line FOLFOX every 2 weeks with Avastin starting with cycle 2 starting 05/16/19. 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 Stopped after 10/31/19 for surgery.    07/23/2019 Imaging    CT AP W Contrast  IMPRESSION: 1. Primary ascending colon mass may be minimally smaller. Peritoneal metastatic disease appears slightly improved as well. 2. Chronic calcific pancreatitis. 3. Tiny left renal stone. 4. Small left inguinal hernia contains a knuckle of unobstructed colon. 5. Enlarged prostate.   10/29/2019 Imaging   CT AP W Contrast IMPRESSION: No significant change in small ascending colon soft tissue  mass and diffuse omental  carcinomatosis.   No new or progressive metastatic disease identified.   Stable small left inguinal hernia containing a loop of sigmoid colon. No evidence of bowel obstruction or strangulation.   Colonic diverticulosis. No radiographic evidence of diverticulitis.   Stable enlarged prostate.   12/17/2019 Pathology Results   HIPEC Surgery by Dr Clovis Riley 12/17/19 FINAL PATHOLOGIC DIAGNOSIS  MICROSCOPIC EXAMINATION AND DIAGNOSIS  A.          "RIGHT COLON, OMENTUM, SPLEEN":       Invasive adenocarcinoma with mucinous features, moderately differentiated.  Tumor involves mesentery and spleen.  Five out of ten lymph nodes involved by adenocarcinoma with perinodal soft tissue involvement (5/10), see comment.  Margins are uninvolved by invasive carcinoma.  Ileum and appendix, uninvolved.  See comment and cancer case summary.  B.          "ROUND LIGAMENT OF LIVER":       Involved by adenocarcinoma with mucinous features, moderately differentiated.  C.          "RIGHT DIAPHRAM STRIPPING":       Negative for carcinoma.  D.          "RIGHT HEPATIC CAPSULECTOMY":       Chronic inflammation and fibrosis.  Negative for carcinoma.  E.          "DEBRIDED TUMOR":       Involved by adenocarcinoma with mucinous features, moderately differentiated.   COMMENT: Sections disclose five out of ten lymph nodes involved by adenocarcinoma with perinodal soft tissue involvement. However, it is not possible to be certain if this represents lymph nodes replaced by tumor and/or soft tissue involvement. In addition, focal perineural invasion is seen.   04/03/2020 Imaging   CT AP W contrast  IMPRESSION: 1. Status post interval splenectomy and right hemicolectomy. 2. Response to therapy of omental/peritoneal metastasis. The only residual indeterminate finding is fluid density along the capsule of the hepatic dome which could be postoperative or secondary to localized residual peritoneal disease. 3. Left  nephrolithiasis. 4.  Possible constipation. 5. Prostatomegaly. 6.  Aortic Atherosclerosis (ICD10-I70.0).   07/11/2020 Imaging   CT AP  1.  Postsurgical changes of subtle reductive surgery including right hemicolectomy, omentectomy, splenectomy, and right hepatic capsulectomy.  2.  Interval development of multiple areas of low attenuation involving the liver, as described above. This the intraparenchymal low-density lesion is concerning for metastatic disease. Other disease along the capsule may be post therapy related. Residual thickening of the anterior peritoneum particularly anterior to the right lobe of the liver is also concerning for some residual disease although appears significantly improved. Recommend attention on follow-up.  3.  No definite evidence of residual peritoneal disease status post HIPEC.   09/18/2020 -  Chemotherapy   Second line chemo FOLFIRI and bevacizumab q2weeks starting 09/18/20   12/19/2020 Imaging   CT CAP  IMPRESSION: 1. Interval development of a 1.6 x 2.7 x 1.6 cm collection of gas and debris between the sigmoid colon and dome of bladder, compatible with small abscess. A portion of this abscess is probably intramural at the dome of bladder. Marked associated thickening of the adjacent colonic and bladder walls with tiny gas bubbles in the thickened bladder wall and prominent amount of free gas in the bladder lumen suggests an associated colovesical fistula. Tiny gas bubbles are also seen in the central prostate gland and may be in the urethra although parenchymal gas within the prostate gland is not excluded on this exam.  2. Nodularity along the major and minor fissures of the right hemithorax has decreased in the interval, nearly imperceptible today. 3. Peritoneal implants along the liver, right abdomen, and deep to the left paramidline anterior abdominal wall have resolved in the interval by CT imaging. 4. Tiny right lower lobe pulmonary nodule is more  conspicuous today. Attention on follow-up recommended. 5. Stable 4 mm hypodensity in the dome of the right liver, too small to characterize. 6. Nonobstructing left renal stones with bilateral renal cysts. 7. Stable compression deformity at multiple thoracic and lumbar levels. 8. Aortic Atherosclerosis (ICD10-I70.0).      CURRENT THERAPY:  Second line chemo FOLFIRI andbevacizumabq2weeks starting 09/18/20. Chemo on hold since 12/23/20 due to fistula.   INTERVAL HISTORY:  Nathaniel Norton is here for a follow up. He presents to the clinic alone. He notes he saw Dr Clovis Riley 1 week ago. He was told he can continue antibiotics as needed, Surgery for his abscess with removal of bladder and prostate or surgery to remove part of rectum. With surgery options he will need permanent ostomy bag. He notes Cipro has helped for now, but he ran out 3 days ago and burning pain in abdomen has started again. He notes he still would like to consult with urologist. He will consider surgery based on the risk and benefits.     REVIEW OF SYSTEMS:   Constitutional: Denies fevers, chills or abnormal weight loss Eyes: Denies blurriness of vision Ears, nose, mouth, throat, and face: Denies mucositis or sore throat Respiratory: Denies cough, dyspnea or wheezes Cardiovascular: Denies palpitation, chest discomfort or lower extremity swelling Gastrointestinal:  Denies nausea, heartburn or change in bowel habits Skin: Denies abnormal skin rashes Lymphatics: Denies new lymphadenopathy or easy bruising Neurological:Denies numbness, tingling or new weaknesses Behavioral/Psych: Mood is stable, no new changes  All other systems were reviewed with the patient and are negative.  MEDICAL HISTORY:  Past Medical History:  Diagnosis Date  . colon ca dx'd 03/2019    SURGICAL HISTORY: Past Surgical History:  Procedure Laterality Date  . CATARACT EXTRACTION    . IR IMAGING GUIDED PORT INSERTION  05/10/2019  . L4 fracture   2012  . RETINAL DETACHMENT SURGERY    . Right hand surgery  1974    I have reviewed the social history and family history with the patient and they are unchanged from previous note.  ALLERGIES:  is allergic to feraheme [ferumoxytol] and codeine.  MEDICATIONS:  Current Outpatient Medications  Medication Sig Dispense Refill  . ciprofloxacin (CIPRO) 500 MG tablet Take 1 tablet (500 mg total) by mouth 2 (two) times daily. 20 tablet 1  . diphenoxylate-atropine (LOMOTIL) 2.5-0.025 MG tablet Take 1-2 tablets by mouth 4 (four) times daily as needed for diarrhea or loose stools. 90 tablet 1  . ferrous sulfate 325 (65 FE) MG tablet Take 325 mg by mouth daily with breakfast.    . fluticasone (FLONASE) 50 MCG/ACT nasal spray Place into the nose.    . metroNIDAZOLE (FLAGYL) 500 MG tablet Take 1 tablet (500 mg total) by mouth 3 (three) times daily. 30 tablet 1  . mirtazapine (REMERON) 7.5 MG tablet Take 1 tablet (7.5 mg total) by mouth at bedtime. 30 tablet 1  . Multiple Vitamin (MULTIVITAMIN) tablet Take 1 tablet by mouth daily.    . ondansetron (ZOFRAN) 8 MG tablet Take 1 tablet (8 mg total) by mouth every 8 (eight) hours as needed for nausea or vomiting. Start on day 3 after chemotherapy 20 tablet  0  . potassium chloride SA (KLOR-CON) 20 MEQ tablet Take 1 tablet (20 mEq total) by mouth 2 (two) times daily. 40 tablet 1  . prochlorperazine (COMPAZINE) 10 MG tablet Take 1 tablet (10 mg total) by mouth every 6 (six) hours as needed (Nausea or vomiting). 30 tablet 2   No current facility-administered medications for this visit.    PHYSICAL EXAMINATION: ECOG PERFORMANCE STATUS: 2 - Symptomatic, <50% confined to bed  Vitals:   01/02/21 1553  BP: (!) 159/94  Pulse: 93  Resp: 18  Temp: (!) 97 F (36.1 C)  SpO2: 100%   Filed Weights   01/02/21 1553  Weight: 139 lb 8 oz (63.3 kg)    Due to COVID19 we will limit examination to appearance. Patient had no complaints.  GENERAL:alert, no distress  and comfortable SKIN: skin color normal, no rashes or significant lesions EYES: normal, Conjunctiva are pink and non-injected, sclera clear  NEURO: alert & oriented x 3 with fluent speech   LABORATORY DATA:  I have reviewed the data as listed CBC Latest Ref Rng & Units 12/23/2020 12/10/2020 11/26/2020  WBC 4.0 - 10.5 K/uL 7.1 6.3 5.2  Hemoglobin 13.0 - 17.0 g/dL 11.7(L) 10.9(L) 11.7(L)  Hematocrit 39.0 - 52.0 % 35.4(L) 32.8(L) 35.9(L)  Platelets 150 - 400 K/uL 360 326 401(H)     CMP Latest Ref Rng & Units 12/23/2020 12/10/2020 11/26/2020  Glucose 70 - 99 mg/dL 140(H) 124(H) 105(H)  BUN 8 - 23 mg/dL $Remove'8 8 11  '[id number]$ Creatinine 0.61 - 1.24 mg/dL 0.82 0.75 0.79  Sodium 135 - 145 mmol/L 139 137 140  Potassium 3.5 - 5.1 mmol/L 3.8 3.7 4.1  Chloride 98 - 111 mmol/L 103 104 105  CO2 22 - 32 mmol/L 29 28 32  Calcium 8.9 - 10.3 mg/dL 9.2 9.1 10.8(H)  Total Protein 6.5 - 8.1 g/dL 7.0 6.7 7.9  Total Bilirubin 0.3 - 1.2 mg/dL 0.7 1.3(H) 1.0  Alkaline Phos 38 - 126 U/L 79 107 101  AST 15 - 41 U/L 11(L) 24 15  ALT 0 - 44 U/L 10 37 19      RADIOGRAPHIC STUDIES: I have personally reviewed the radiological images as listed and agreed with the findings in the report. No results found.   ASSESSMENT & PLAN:  Nathaniel Norton is a 70 y.o. male with   1. 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 This has caused frequent UTI -This is likely related to his peritoneal metastasis, chemotherapy and bevacizumab. Chemo has been held since 12/23/20  -He consulted with Dr Clovis Riley who discussed continuing antibiotics or proceed with surgery. He discussed surgical options. He may need removal of bladder, prostate and part of sigmoid/rectum, with permanent colostomy. -Pt is thinking about surgical options. Per pt, Dr. Clovis Riley would like  to review his CT before he offers surgery. We have requested his CT disk to be mailed to Dr. Clovis Riley last week, I will f/u   -He can still consult with Urologist soon. He is interested.  -I refilled Cipro and Flagyl today (01/02/21). He can continue Cipro as needed and if not enough, add Flagyl.    2. Cancer of right colon,with peritonealand lungmetastasis,stage IV,MMR normal, KRAS mutation (+), MSS -He wasdiagnosed in 03/2019. His Colonoscopy biopsy showed adenocarcinoma of right hepatic flexurecolon.His peritoneal biopsy from 04/27/19 showsadenocarcinoma,consistent  withmetastasis of his colon cancer. This is now stage IV disease. -He was treated with first-line FOLFOX in June-November 2020 before proceeding with HIPECsurgeryby Dr. Dustin Folks 12/17/19. -Unfortunately he had disease progression on 09/10/20 PET withhypermetabolic pleural (right)and peritoneal nodules, which are consistent with metastatic disease.I do not think we need biopsy to confirm given his previously known peritoneal metastasis -Istarted him onsecond-line chemo withFOLFIRI and bevacizumab q2weekson 09/18/20. 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 Chemo has been on hold since 12/23/20.  -He is considering Fistula surgery although this will likely result in permanent ostomy bag.  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 Watt Climes showed internal hemorrhoids, benign polyps, diverticulosis and colon cancer.  -He was treated with oral iron,IV Feraheme on 05/03/19 and 05/16/19(had reaction with 2nd dose). -With cancer recurrence his anemia recurred.Overall mild -Ferritin 650 on 10/29/2020  4. Elevated PSA  -PSA was 5.7 in 11/2018 and 4.6 in  01/2019. He has known enlarged prostate. 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 Possible surgery may include removal of bladder and prostate. He will consult with urologist soon.   5.Goal of care discussion  -The patient understands the goal of care is palliative. -he is full code now  6. Mildhyperbilirubinemia -Mild and intermittent lately    PLAN: -I refilled Cipro and Flagyl today, will continue as needed for his UTI symptoms -Lab, flush and f/u in 3 weeks  -I will reach out to Dr. Clovis Riley to see if he has reviewed his CT images and ready to offer surgery. Pt is open to surgery.   No problem-specific Assessment & Plan notes found for this encounter.   No orders of the defined types were placed in this encounter.  All questions were answered. The patient knows to call the clinic with any problems, questions or concerns. No barriers to learning was detected. The total time spent in the appointment was 30 minutes.     Truitt Merle, MD 01/02/2021   I, Joslyn Devon, am acting as scribe for Truitt Merle, MD.   I have reviewed the above documentation for accuracy and completeness, and I agree with the above.

## 2021-01-02 ENCOUNTER — Other Ambulatory Visit: Payer: Self-pay

## 2021-01-02 ENCOUNTER — Inpatient Hospital Stay (HOSPITAL_BASED_OUTPATIENT_CLINIC_OR_DEPARTMENT_OTHER): Payer: Medicare Other | Admitting: Hematology

## 2021-01-02 VITALS — BP 159/94 | HR 93 | Temp 97.0°F | Resp 18 | Ht 66.0 in | Wt 139.5 lb

## 2021-01-02 DIAGNOSIS — C7800 Secondary malignant neoplasm of unspecified lung: Secondary | ICD-10-CM | POA: Diagnosis not present

## 2021-01-02 DIAGNOSIS — C182 Malignant neoplasm of ascending colon: Secondary | ICD-10-CM

## 2021-01-02 DIAGNOSIS — C786 Secondary malignant neoplasm of retroperitoneum and peritoneum: Secondary | ICD-10-CM | POA: Diagnosis not present

## 2021-01-02 DIAGNOSIS — Z9049 Acquired absence of other specified parts of digestive tract: Secondary | ICD-10-CM | POA: Diagnosis not present

## 2021-01-02 DIAGNOSIS — Z79899 Other long term (current) drug therapy: Secondary | ICD-10-CM | POA: Diagnosis not present

## 2021-01-02 DIAGNOSIS — Z885 Allergy status to narcotic agent status: Secondary | ICD-10-CM | POA: Diagnosis not present

## 2021-01-02 DIAGNOSIS — Z9081 Acquired absence of spleen: Secondary | ICD-10-CM | POA: Diagnosis not present

## 2021-01-02 MED ORDER — METRONIDAZOLE 500 MG PO TABS: 500.0000 mg | ORAL_TABLET | Freq: Three times a day (TID) | ORAL | 1 refills | Status: DC

## 2021-01-02 MED ORDER — CIPROFLOXACIN HCL 500 MG PO TABS: 500.0000 mg | ORAL_TABLET | Freq: Two times a day (BID) | ORAL | 1 refills | Status: DC

## 2021-01-04 ENCOUNTER — Encounter: Payer: Self-pay | Admitting: Hematology

## 2021-01-05 ENCOUNTER — Telehealth: Payer: Self-pay | Admitting: Hematology

## 2021-01-05 NOTE — Telephone Encounter (Signed)
Scheduled appts per 12/1 los. Pt confirmed appt date and time.  

## 2021-01-21 DIAGNOSIS — K632 Fistula of intestine: Secondary | ICD-10-CM | POA: Diagnosis not present

## 2021-01-21 DIAGNOSIS — C786 Secondary malignant neoplasm of retroperitoneum and peritoneum: Secondary | ICD-10-CM | POA: Diagnosis not present

## 2021-01-21 DIAGNOSIS — C189 Malignant neoplasm of colon, unspecified: Secondary | ICD-10-CM | POA: Diagnosis not present

## 2021-01-21 NOTE — Progress Notes (Signed)
[facility]   [phone] [phone]   Clinic Follow up Note   Patient Care Team: Lajean Manes, MD as PCP - General (Internal Medicine) Berle Mull, MD as Consulting Physician (Family Medicine) Clarene Essex, MD as Consulting Physician (Gastroenterology) Truitt Merle, MD as Consulting Physician (Oncology)  Date of Service:  01/23/2021  CHIEF COMPLAINT: F/u ofmetastaticcolon cancer  SUMMARY OF ONCOLOGIC HISTORY: Oncology History Overview Note  Cancer Staging Cancer of right colon [facility]) Staging form: Colon and Rectum, AJCC 8th Edition - Clinical stage from 04/09/2019: Stage IVC (cTX, cNX, pM1c) - Signed by Truitt Merle, MD on 05/03/2019     Cancer of right colon [facility])  04/09/2019 Procedure   Colonoscopy 04/09/19 by Dr Watt Climes IMPRESSION -internal hemorrhoids -Diverticulosis in the sigmoid colon  -2 small polyps in the rectum and in the proximal transverse colon, removed with a hot snare. Resected and retrieved.  -3 medium polyps in the proximal transverse colon, in the mid transverse colon and in the distal transverse colon, removed and resected and retrieved.  -likely malignant partially obstructing tumor at the hepatic flexure. biopsied, tattooed.  -1 large polyp in the mid ascending colon  -the examination was otherwise normal     04/09/2019 Initial Biopsy   FINAL MICROSCOPIC DIAGNOSIS: 04/09/19 1. LG intestine-hepatic flexure, Biopsy:   INVASIVE WELL DIFFERENTIATED ADENOCARCINOMA   04/09/2019 Cancer Staging   Staging form: Colon and Rectum, AJCC 8th Edition - Clinical stage from 04/09/2019: Stage IVC (cTX, cNX, pM1c) - Signed by Truitt Merle, MD on 05/03/2019   04/10/2019 Imaging   CT AP 04/10/19  IMPRESSION: 1. There is an eccentric mass of the colon involving the ascending colon near the hepatic flexure measuring approximately 3.4 x 3.4 by 2.0 cm (series 2, image 37, series 3, image 37). There is extensive soft tissue nodularity of the  mesocolon and omentum, and likely areas of the peritoneum, for example bilateral upper quadrants (series 2, image 25). Findings are consistent with primary colon malignancy, probable omental and peritoneal involvement, and small volume malignant ascites. 2.  Other chronic and incidental findings as detailed above.   04/20/2019 Initial Diagnosis   Cancer of right colon ([place])   04/26/2019 Imaging   CT Chest 04/26/19 IMPRESSION: 1. Borderline to mild lower thoracic adenopathy, including within the right internal mammary and juxta cardiophrenic stations. Given the appearance of the upper abdomen, suspicious for nodal metastasis. 2. A low right paratracheal node is borderline sized, but favored to be reactive. 3. No evidence of pulmonary metastasis. 4. Peritoneal metastasis and abdominal ascites, as before. 5. Pancreatic parenchymal calcifications indicative of chronic calcific pancreatitis. 6. Coronary artery atherosclerosis.   04/27/2019 Pathology Results   Diagnosis 04/27/19 Peritoneum, biopsy, right upper quadrant, perihepatic - ADENOCARCINOMA, CONSISTENT WITH COLONIC PRIMARY. - SEE COMMENT.   05/16/2019 - 10/31/2019 Chemotherapy   First line FOLFOX every 2 weeks with Avastin starting with cycle 2 starting 05/16/19. 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 Stopped after 10/31/19 for surgery.    07/23/2019 Imaging    CT AP W Contrast  IMPRESSION: 1. Primary ascending colon mass may be minimally smaller. Peritoneal metastatic disease appears slightly improved as well. 2. Chronic calcific pancreatitis. 3. Tiny left renal stone. 4. Small left inguinal hernia contains a knuckle of unobstructed colon. 5. Enlarged prostate.   10/29/2019 Imaging   CT AP W Contrast IMPRESSION: No significant  change in small ascending colon  soft tissue mass and diffuse omental carcinomatosis.   No new or progressive metastatic disease identified.   Stable small left inguinal hernia containing a loop of sigmoid colon. No evidence of bowel obstruction or strangulation.   Colonic diverticulosis. No radiographic evidence of diverticulitis.   Stable enlarged prostate.   12/17/2019 Pathology Results   HIPEC Surgery by Dr Clovis Riley 12/17/19 FINAL PATHOLOGIC DIAGNOSIS  MICROSCOPIC EXAMINATION AND DIAGNOSIS  A.          "RIGHT COLON, OMENTUM, SPLEEN":       Invasive adenocarcinoma with mucinous features, moderately differentiated.  Tumor involves mesentery and spleen.  Five out of ten lymph nodes involved by adenocarcinoma with perinodal soft tissue involvement (5/10), see comment.  Margins are uninvolved by invasive carcinoma.  Ileum and appendix, uninvolved.  See comment and cancer case summary.  B.          "ROUND LIGAMENT OF LIVER":       Involved by adenocarcinoma with mucinous features, moderately differentiated.  C.          "RIGHT DIAPHRAM STRIPPING":       Negative for carcinoma.  D.          "RIGHT HEPATIC CAPSULECTOMY":       Chronic inflammation and fibrosis.  Negative for carcinoma.  E.          "DEBRIDED TUMOR":       Involved by adenocarcinoma with mucinous features, moderately differentiated.   COMMENT: Sections disclose five out of ten lymph nodes involved by adenocarcinoma with perinodal soft tissue involvement. However, it is not possible to be certain if this represents lymph nodes replaced by tumor and/or soft tissue involvement. In addition, focal perineural invasion is seen.   04/03/2020 Imaging   CT AP W contrast  IMPRESSION: 1. Status post interval splenectomy and right hemicolectomy. 2. Response to therapy of omental/peritoneal metastasis. The only residual indeterminate finding is fluid density along the capsule of the hepatic dome which could be postoperative or secondary to localized  residual peritoneal disease. 3. Left nephrolithiasis. 4.  Possible constipation. 5. Prostatomegaly. 6.  Aortic Atherosclerosis (ICD10-I70.0).   07/11/2020 Imaging   CT AP  1.  Postsurgical changes of subtle reductive surgery including right hemicolectomy, omentectomy, splenectomy, and right hepatic capsulectomy.  2.  Interval development of multiple areas of low attenuation involving the liver, as described above. This the intraparenchymal low-density lesion is concerning for metastatic disease. Other disease along the capsule may be post therapy related. Residual thickening of the anterior peritoneum particularly anterior to the right lobe of the liver is also concerning for some residual disease although appears significantly improved. Recommend attention on follow-up.  3.  No definite evidence of residual peritoneal disease status post HIPEC.   09/18/2020 -  Chemotherapy   Second line chemo FOLFIRI and bevacizumab q2weeks starting 09/18/20   12/19/2020 Imaging   CT CAP  IMPRESSION: 1. Interval development of a 1.6 x 2.7 x 1.6 cm collection of gas and debris between the sigmoid colon and dome of bladder, compatible with small abscess. A portion of this abscess is probably intramural at the dome of bladder. Marked associated thickening of the adjacent colonic and bladder walls with tiny gas bubbles in the thickened bladder wall and prominent amount of free gas in the bladder lumen suggests an associated colovesical fistula. Tiny gas bubbles are also seen in the central prostate gland and may be in the urethra although parenchymal gas within the prostate  gland is not excluded on this exam. 2. Nodularity along the major and minor fissures of the right hemithorax has decreased in the interval, nearly imperceptible today. 3. Peritoneal implants along the liver, right abdomen, and deep to the left paramidline anterior abdominal wall have resolved in the interval by CT imaging. 4. Tiny right  lower lobe pulmonary nodule is more conspicuous today. Attention on follow-up recommended. 5. Stable 4 mm hypodensity in the dome of the right liver, too small to characterize. 6. Nonobstructing left renal stones with bilateral renal cysts. 7. Stable compression deformity at multiple thoracic and lumbar levels. 8. Aortic Atherosclerosis (ICD10-I70.0).      CURRENT THERAPY:  Second line chemo FOLFIRI andbevacizumabq2weeks starting 09/18/20. Chemo on hold since 12/23/20 due to fistula.   INTERVAL HISTORY:  Nathaniel Norton is here for a follow up. He presents to the clinic alone. He notes he feels his UTI has restarted. He notes his last antibiotics was taken 2 days ago. He restarted Cipro today. He notes he used his old oxycodone earlier this week due to severe abdominal pain which worked very well. The next day he took 2 tabs which controlled his pain. He has taken them q6hours which is how they are prescribed. He would like to continue this to control his pain. He notes he met with Dr Clovis Riley who said the CD of scan was unreadable. He was never contacted back since. Dr Clovis Riley notes he is eligible for surgery. He notes he is frustrated with going back and forth between offices with little action. He notes black spots in his urine, that he feels passing with urination.     REVIEW OF SYSTEMS:   Constitutional: Denies fevers, chills or abnormal weight loss Eyes: Denies blurriness of vision Ears, nose, mouth, throat, and face: Denies mucositis or sore throat Respiratory: Denies cough, dyspnea or wheezes Cardiovascular: Denies palpitation, chest discomfort or lower extremity swelling Gastrointestinal:  Denies nausea, heartburn or change in bowel habits Skin: Denies abnormal skin rashes Lymphatics: Denies new lymphadenopathy or easy bruising Neurological:Denies numbness, tingling or new weaknesses Behavioral/Psych: Mood is stable, no new changes  All other systems were reviewed with the  patient and are negative.  MEDICAL HISTORY:  Past Medical History:  Diagnosis Date  . colon ca dx'd 03/2019    SURGICAL HISTORY: Past Surgical History:  Procedure Laterality Date  . CATARACT EXTRACTION    . IR IMAGING GUIDED PORT INSERTION  05/10/2019  . L4 fracture  2012  . RETINAL DETACHMENT SURGERY    . Right hand surgery  1974    I have reviewed the social history and family history with the patient and they are unchanged from previous note.  ALLERGIES:  is allergic to feraheme [ferumoxytol] and codeine.  MEDICATIONS:  Current Outpatient Medications  Medication Sig Dispense Refill  . oxyCODONE (OXY IR/ROXICODONE) 5 MG immediate release tablet Take 1 tablet (5 mg total) by mouth every 6 (six) hours as needed for severe pain. 90 tablet 0  . ciprofloxacin (CIPRO) 500 MG tablet Take 1 tablet (500 mg total) by mouth 2 (two) times daily. 30 tablet 1  . diphenoxylate-atropine (LOMOTIL) 2.5-0.025 MG tablet Take 1-2 tablets by mouth 4 (four) times daily as needed for diarrhea or loose stools. 90 tablet 1  . ferrous sulfate 325 (65 FE) MG tablet Take 325 mg by mouth daily with breakfast.    . fluticasone (FLONASE) 50 MCG/ACT nasal spray Place into the nose.    . metroNIDAZOLE (FLAGYL) 500 MG tablet Take  1 tablet (500 mg total) by mouth 3 (three) times daily. 30 tablet 1  . mirtazapine (REMERON) 7.5 MG tablet Take 1 tablet (7.5 mg total) by mouth at bedtime. 30 tablet 1  . Multiple Vitamin (MULTIVITAMIN) tablet Take 1 tablet by mouth daily.    . ondansetron (ZOFRAN) 8 MG tablet Take 1 tablet (8 mg total) by mouth every 8 (eight) hours as needed for nausea or vomiting. Start on day 3 after chemotherapy 20 tablet 0  . potassium chloride SA (KLOR-CON) 20 MEQ tablet Take 1 tablet (20 mEq total) by mouth 2 (two) times daily. 40 tablet 1  . prochlorperazine (COMPAZINE) 10 MG tablet Take 1 tablet (10 mg total) by mouth every 6 (six) hours as needed (Nausea or vomiting). 30 tablet 2   No current  facility-administered medications for this visit.    PHYSICAL EXAMINATION: ECOG PERFORMANCE STATUS: 1 - Symptomatic but completely ambulatory  Vitals:   01/23/21 1319  BP: 139/87  Pulse: 95  Resp: 16  Temp: 98.4 F (36.9 C)  SpO2: 100%   Filed Weights   01/23/21 1319  Weight: 139 lb 6.4 oz (63.2 kg)    GENERAL:alert, no distress and comfortable SKIN: skin color, texture, turgor are normal, no rashes or significant lesions EYES: normal, Conjunctiva are pink and non-injected, sclera clear  NECK: supple, thyroid normal size, non-tender, without nodularity LYMPH:  no palpable lymphadenopathy in the cervical, axillary  LUNGS: clear to auscultation and percussion with normal breathing effort HEART: regular rate & rhythm and no murmurs and no lower extremity edema ABDOMEN:abdomen soft, non-tender and normal bowel sounds Musculoskeletal:no cyanosis of digits and no clubbing  NEURO: alert & oriented x 3 with fluent speech, no focal motor/sensory deficits  LABORATORY DATA:  I have reviewed the data as listed CBC Latest Ref Rng & Units 01/23/2021 12/23/2020 12/10/2020  WBC 4.0 - 10.5 K/uL 8.0 7.1 6.3  Hemoglobin 13.0 - 17.0 g/dL 12.4(L) 11.7(L) 10.9(L)  Hematocrit 39.0 - 52.0 % 38.3(L) 35.4(L) 32.8(L)  Platelets 150 - 400 K/uL 305 360 326     CMP Latest Ref Rng & Units 01/23/2021 12/23/2020 12/10/2020  Glucose 70 - 99 mg/dL 107(H) 140(H) 124(H)  BUN 8 - 23 mg/dL '8 8 8  ' Creatinine 0.61 - 1.24 mg/dL 0.69 0.82 0.75  Sodium 135 - 145 mmol/L 135 139 137  Potassium 3.5 - 5.1 mmol/L 4.0 3.8 3.7  Chloride 98 - 111 mmol/L 100 103 104  CO2 22 - 32 mmol/L '27 29 28  ' Calcium 8.9 - 10.3 mg/dL 9.3 9.2 9.1  Total Protein 6.5 - 8.1 g/dL 7.3 7.0 6.7  Total Bilirubin 0.3 - 1.2 mg/dL 0.9 0.7 1.3(H)  Alkaline Phos 38 - 126 U/L 73 79 107  AST 15 - 41 U/L 12(L) 11(L) 24  ALT 0 - 44 U/L 8 10 37      RADIOGRAPHIC STUDIES: I have personally reviewed the radiological images as listed and agreed  with the findings in the report. No results found.   ASSESSMENT & PLAN:  Nathaniel Norton is a 70 y.o. male with   1. 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 This has caused frequent UTI -This is likely related to hisperitoneal metastasis, chemotherapy and bevacizumab. Chemo has been held since 12/23/20  -He consulted with Dr Clovis Riley who discussed continuing antibiotics or proceed  with surgery. He discussed surgical options. He may need removal of bladder, prostate and part of sigmoid/rectum, with permanent colostomy.  -He continues Cipro and Flagyl with intermittent use. He will continue to watch for signs of infection.  -He notes recent lower abdominal pain. He took his old Oxycodone q6hours which helped control his pain earlier this week. I reviewed Narcotic use with him and discussed this can lead to constipation and dependence. He voiced good understanding. I filled his Oxycodone at (01/23/21). For mild pain he can take Tylenol.  -He notes he is interested in surgery which Dr Clovis Riley has offered, but Dr. Clovis Riley still has not read the scan images from our offices. -I will continue holding chemo for now due to his colovesicle fistular, and associated pelvis abscess and chronic UTI. I recommend him to consider surgery (even just simple diverting colostomy). I will restart chemo when he recovers well from surgery -I will talk to Dr Clovis Riley about this.   2. Cancer of right colon,with peritonealand lungmetastasis,stage IV,MMR normal, KRAS mutation (+), MSS -He wasdiagnosed in 03/2019. His Colonoscopy biopsy showed adenocarcinoma of right hepatic flexurecolon.His peritoneal biopsy from 04/27/19 showsadenocarcinoma,consistent withmetastasis of his colon cancer. This is now stage IV disease. -He was  treated with first-line FOLFOX in June-November 2020 before proceeding with HIPECsurgeryby Dr. Dustin Folks 12/17/19. -Unfortunately he had disease progression on 09/10/20 PET withhypermetabolic pleural (right)and peritoneal nodules, which are consistent with metastatic disease.I do not think we need biopsy to confirm given his previously known peritoneal metastasis -Istarted him onsecond-line chemo withFOLFIRI and bevacizumab q2weekson 09/18/20. 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  -He is considering Fistula surgery although this will likely result in permanent ostomy bag(s).  -I discussed continuing to hold chemo while he has active abscess infection.   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 Watt Climes showed internal hemorrhoids, benign polyps, diverticulosis and colon cancer.  -He was treated with oral iron,IV Feraheme on 05/03/19 and 05/16/19(had reaction with 2nd dose). -With cancer recurrence his anemia recurred.Overall mild -Ferritin 650 on 10/29/2020  4. Elevated PSA  -PSA was 5.7 in 11/2018 and 4.6 in 01/2019. He has known enlarged prostate. 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 Possible surgery may include removal of bladder and prostate. He will consult with urologist soon.   5.Goal of care discussion  -The patient understands the goal of care is palliative. -he is full code now  6. Mildhyperbilirubinemia -Mild and intermittent lately    PLAN: -I refilled Cipro and Oxycodone today  -Will culture his urine sample from today  -I emailed Dr. Clovis Riley and requested a call back  -My nurse has again contacted our radiology to send his  CT images to [facility]  -Lab, flush and F/u in 3 weeks    No problem-specific Assessment & Plan notes found for this encounter.   Orders Placed This Encounter  Procedures  . Culture, Urine    Standing Status:   Future    Standing Expiration Date:   01/23/2022   All questions were answered. The patient knows to call the clinic with any problems, questions or concerns. No barriers to learning was detected. The total time spent in the appointment was 30 minutes.     Truitt Merle,  MD 01/23/2021   Oneal Deputy, am acting as scribe for Truitt Merle, MD.   I have reviewed the above documentation for accuracy and completeness, and I agree with the above.

## 2021-01-23 ENCOUNTER — Telehealth: Payer: Self-pay

## 2021-01-23 ENCOUNTER — Other Ambulatory Visit: Payer: Self-pay

## 2021-01-23 ENCOUNTER — Inpatient Hospital Stay: Payer: Medicare Other

## 2021-01-23 ENCOUNTER — Encounter: Payer: Self-pay | Admitting: Hematology

## 2021-01-23 ENCOUNTER — Telehealth: Payer: Self-pay | Admitting: Hematology

## 2021-01-23 ENCOUNTER — Inpatient Hospital Stay: Payer: Medicare Other | Attending: Hematology | Admitting: Hematology

## 2021-01-23 VITALS — BP 139/87 | HR 95 | Temp 98.4°F | Resp 16 | Ht 66.0 in | Wt 139.4 lb

## 2021-01-23 DIAGNOSIS — C7800 Secondary malignant neoplasm of unspecified lung: Secondary | ICD-10-CM | POA: Insufficient documentation

## 2021-01-23 DIAGNOSIS — Z79899 Other long term (current) drug therapy: Secondary | ICD-10-CM | POA: Insufficient documentation

## 2021-01-23 DIAGNOSIS — C182 Malignant neoplasm of ascending colon: Secondary | ICD-10-CM | POA: Insufficient documentation

## 2021-01-23 DIAGNOSIS — D500 Iron deficiency anemia secondary to blood loss (chronic): Secondary | ICD-10-CM

## 2021-01-23 DIAGNOSIS — Z885 Allergy status to narcotic agent status: Secondary | ICD-10-CM | POA: Diagnosis not present

## 2021-01-23 DIAGNOSIS — Z9081 Acquired absence of spleen: Secondary | ICD-10-CM | POA: Insufficient documentation

## 2021-01-23 DIAGNOSIS — N3000 Acute cystitis without hematuria: Secondary | ICD-10-CM | POA: Diagnosis not present

## 2021-01-23 DIAGNOSIS — Z9049 Acquired absence of other specified parts of digestive tract: Secondary | ICD-10-CM | POA: Insufficient documentation

## 2021-01-23 DIAGNOSIS — C786 Secondary malignant neoplasm of retroperitoneum and peritoneum: Secondary | ICD-10-CM | POA: Insufficient documentation

## 2021-01-23 DIAGNOSIS — R809 Proteinuria, unspecified: Secondary | ICD-10-CM

## 2021-01-23 DIAGNOSIS — Z95828 Presence of other vascular implants and grafts: Secondary | ICD-10-CM

## 2021-01-23 LAB — CMP (CANCER CENTER ONLY)
ALT: 8 U/L (ref 0–44)
AST: 12 U/L — ABNORMAL LOW (ref 15–41)
Albumin: 3.4 g/dL — ABNORMAL LOW (ref 3.5–5.0)
Alkaline Phosphatase: 73 U/L (ref 38–126)
Anion gap: 8 (ref 5–15)
BUN: 8 mg/dL (ref 8–23)
CO2: 27 mmol/L (ref 22–32)
Calcium: 9.3 mg/dL (ref 8.9–10.3)
Chloride: 100 mmol/L (ref 98–111)
Creatinine: 0.69 mg/dL (ref 0.61–1.24)
GFR, Estimated: >60 mL/min (ref >60)
Glucose, Bld: 107 mg/dL — ABNORMAL HIGH (ref 70–99)
Potassium: 4 mmol/L (ref 3.5–5.1)
Sodium: 135 mmol/L (ref 135–145)
Total Bilirubin: 0.9 mg/dL (ref 0.3–1.2)
Total Protein: 7.3 g/dL (ref 6.5–8.1)

## 2021-01-23 LAB — FERRITIN: Ferritin: 235 ng/mL (ref 24–336)

## 2021-01-23 LAB — CBC WITH DIFFERENTIAL (CANCER CENTER ONLY)
Abs Immature Granulocytes: 0.02 10*3/uL (ref 0.00–0.07)
Basophils Absolute: 0.1 10*3/uL (ref 0.0–0.1)
Basophils Relative: 1 %
Eosinophils Absolute: 0.1 10*3/uL (ref 0.0–0.5)
Eosinophils Relative: 1 %
HCT: 38.3 % — ABNORMAL LOW (ref 39.0–52.0)
Hemoglobin: 12.4 g/dL — ABNORMAL LOW (ref 13.0–17.0)
Immature Granulocytes: 0 %
Lymphocytes Relative: 26 %
Lymphs Abs: 2.1 10*3/uL (ref 0.7–4.0)
MCH: 29 pg (ref 26.0–34.0)
MCHC: 32.4 g/dL (ref 30.0–36.0)
MCV: 89.5 fL (ref 80.0–100.0)
Monocytes Absolute: 1.2 10*3/uL — ABNORMAL HIGH (ref 0.1–1.0)
Monocytes Relative: 15 %
Neutro Abs: 4.6 10*3/uL (ref 1.7–7.7)
Neutrophils Relative %: 57 %
Platelet Count: 305 10*3/uL (ref 150–400)
RBC: 4.28 MIL/uL (ref 4.22–5.81)
RDW: 17.6 % — ABNORMAL HIGH (ref 11.5–15.5)
WBC Count: 8 10*3/uL (ref 4.0–10.5)
nRBC: 0 % (ref 0.0–0.2)

## 2021-01-23 LAB — CEA (IN HOUSE-CHCC): CEA (CHCC-In House): 2.74 ng/mL (ref 0.00–5.00)

## 2021-01-23 LAB — TOTAL PROTEIN, URINE DIPSTICK: Protein, ur: 100 mg/dL — AB

## 2021-01-23 MED ORDER — OXYCODONE HCL 5 MG PO TABS: 5.0000 mg | ORAL_TABLET | Freq: Four times a day (QID) | ORAL | 0 refills | Status: DC | PRN

## 2021-01-23 MED ORDER — CIPROFLOXACIN HCL 500 MG PO TABS: 500.0000 mg | ORAL_TABLET | Freq: Two times a day (BID) | ORAL | 1 refills | Status: DC

## 2021-01-23 MED ORDER — HEPARIN SOD (PORK) LOCK FLUSH 100 UNIT/ML IV SOLN
500.0000 [IU] | Freq: Once | INTRAVENOUS | Status: AC | PRN
  Administered 2021-01-23: 500 [IU]
  Filled 2021-01-23: qty 5

## 2021-01-23 MED ORDER — SODIUM CHLORIDE 0.9% FLUSH
10.0000 mL | INTRAVENOUS | Status: DC | PRN
  Administered 2021-01-23: 10 mL
  Filled 2021-01-23: qty 10

## 2021-01-23 NOTE — Patient Instructions (Signed)
Implanted Port Insertion, Care After This sheet gives you information about how to care for yourself after your procedure. Your health care provider may also give you more specific instructions. If you have problems or questions, contact your health care provider. What can I expect after the procedure? After the procedure, it is common to have:  Discomfort at the port insertion site.  Bruising on the skin over the port. This should improve over 3-4 days. Follow these instructions at home: Port care  After your port is placed, you will get a manufacturer's information card. The card has information about your port. Keep this card with you at all times.  Take care of the port as told by your health care provider. Ask your health care provider if you or a family member can get training for taking care of the port at home. A home health care nurse may also take care of the port.  Make sure to remember what type of port you have. Incision care  Follow instructions from your health care provider about how to take care of your port insertion site. Make sure you: ? Wash your hands with soap and water before and after you change your bandage (dressing). If soap and water are not available, use hand sanitizer. ? Change your dressing as told by your health care provider. ? Leave stitches (sutures), skin glue, or adhesive strips in place. These skin closures may need to stay in place for 2 weeks or longer. If adhesive strip edges start to loosen and curl up, you may trim the loose edges. Do not remove adhesive strips completely unless your health care provider tells you to do that.  Check your port insertion site every day for signs of infection. Check for: ? Redness, swelling, or pain. ? Fluid or blood. ? Warmth. ? Pus or a bad smell.      Activity  Return to your normal activities as told by your health care provider. Ask your health care provider what activities are safe for you.  Do not  lift anything that is heavier than 10 lb (4.5 kg), or the limit that you are told, until your health care provider says that it is safe. General instructions  Take over-the-counter and prescription medicines only as told by your health care provider.  Do not take baths, swim, or use a hot tub until your health care provider approves. Ask your health care provider if you may take showers. You may only be allowed to take sponge baths.  Do not drive for 24 hours if you were given a sedative during your procedure.  Wear a medical alert bracelet in case of an emergency. This will tell any health care providers that you have a port.  Keep all follow-up visits as told by your health care provider. This is important. Contact a health care provider if:  You cannot flush your port with saline as directed, or you cannot draw blood from the port.  You have a fever or chills.  You have redness, swelling, or pain around your port insertion site.  You have fluid or blood coming from your port insertion site.  Your port insertion site feels warm to the touch.  You have pus or a bad smell coming from the port insertion site. Get help right away if:  You have chest pain or shortness of breath.  You have bleeding from your port that you cannot control. Summary  Take care of the port as told by your   health care provider. Keep the manufacturer's information card with you at all times.  Change your dressing as told by your health care provider.  Contact a health care provider if you have a fever or chills or if you have redness, swelling, or pain around your port insertion site.  Keep all follow-up visits as told by your health care provider. This information is not intended to replace advice given to you by your health care provider. Make sure you discuss any questions you have with your health care provider. Document Revised: 06/27/2018 Document Reviewed: 06/27/2018 Elsevier Patient Education   2021 Elsevier Inc.  

## 2021-01-23 NOTE — Telephone Encounter (Signed)
Scheduled appointments per 2/11 los. Spoke to patient who is aware of appointments date and times. Gave patient calendar print out.

## 2021-01-23 NOTE — Telephone Encounter (Signed)
Spoke with Leandro Reasoner at [place] regarding CT CAP 12/19/2020 that we have pushed images over.  She checked to see if they can see and they cannot.  She checked with her supervisor and evidently there is a problem on their end where the system is hung up.  They are attempting to resolve and she will call me back when images can be seen.    I have also granted access in [employer] to Dr. Clovis Riley with a note to his email asking him to see if he can view and if he can to please call Dr. Burr Medico on her cell phone (the number was provided).    I am awaiting a return call from [place].

## 2021-01-30 ENCOUNTER — Telehealth: Payer: Self-pay

## 2021-01-30 ENCOUNTER — Other Ambulatory Visit: Payer: Self-pay | Admitting: Hematology

## 2021-01-30 MED ORDER — OXYCODONE HCL 5 MG PO TABS: 5.0000 mg | ORAL_TABLET | Freq: Four times a day (QID) | ORAL | 0 refills | Status: DC | PRN

## 2021-01-30 NOTE — Telephone Encounter (Signed)
I spoke with Myra in Medical Records at [facility] and she states they are still having a issue with seeing the images 1/7 CT scan from [place] that have been pushed over.  They are waiting for another CD to be sent.  I called [facility] and spoke with Tedra Coupe who states she was unaware they needed another CD however will burn one today and send it directly to Dr. Clovis Riley at [facility].  Dr. Burr Medico is aware.

## 2021-02-06 ENCOUNTER — Telehealth: Payer: Self-pay

## 2021-02-06 ENCOUNTER — Other Ambulatory Visit: Payer: Self-pay | Admitting: Hematology

## 2021-02-06 ENCOUNTER — Telehealth: Payer: Self-pay | Admitting: Hematology

## 2021-02-06 MED ORDER — OXYCODONE HCL 5 MG PO TABS: 5.0000 mg | ORAL_TABLET | Freq: Four times a day (QID) | ORAL | 0 refills | Status: DC | PRN

## 2021-02-06 NOTE — Telephone Encounter (Signed)
I called Dr. Georgina Snell office today and spoke with him. He has received the CT images we mailed to him last week. He said that he can offer surgery but pt would need a permanent colostomy bag, he wants to make sure pt has realistic expectation from surgery. He will be happy to see him back to make a decision on his surgery. I called pt and relayed the above message to pt. I encouraged pt to call Dr. Georgina Snell office to set up appointment, he agrees.   I refilled his oxycodone today. His insurance only allows 1 week supply.   Truitt Merle  02/06/2021

## 2021-02-06 NOTE — Telephone Encounter (Signed)
Pt called requesting refill on oxycodone.

## 2021-02-06 NOTE — Telephone Encounter (Signed)
I left a voice message for [facility] (Dr. Ronnette Hila) regarding this patient's images.  I have asked that they check to see if they can see his CT from [facility] done on 12/19/20 and if they received a CD of the images that was sent out last Friday 2/18.  I have left my direct contact number for a return call.  I also have emailed Dr. Clovis Riley directly to find out if he received the CD.

## 2021-02-06 NOTE — Telephone Encounter (Signed)
Called [facility] physician access line and asked that Dr. Ronnette Hila call Dr. Burr Medico on her cell phone number.

## 2021-02-11 ENCOUNTER — Telehealth: Payer: Self-pay

## 2021-02-11 NOTE — Telephone Encounter (Signed)
Monthly oxycodone rx has been approved by his insurance.

## 2021-02-11 NOTE — Progress Notes (Signed)
[place]   Telephone:([phone]) [phone] Fax:([phone]) [phone]   Clinic Follow up Note   Patient Care Team: Lajean Manes, MD as PCP - General (Internal Medicine) Berle Mull, MD as Consulting Physician (Family Medicine) Clarene Essex, MD as Consulting Physician (Gastroenterology) Truitt Merle, MD as Consulting Physician (Oncology)  Date of Service:  02/14/2021  CHIEF COMPLAINT: F/u ofmetastaticcolon cancer  SUMMARY OF ONCOLOGIC HISTORY: Oncology History Overview Note  Cancer Staging Cancer of right colon [facility]) Staging form: Colon and Rectum, AJCC 8th Edition - Clinical stage from 04/09/2019: Stage IVC (cTX, cNX, pM1c) - Signed by Truitt Merle, MD on 05/03/2019     Cancer of right colon [facility])  04/09/2019 Procedure   Colonoscopy 04/09/19 by Dr Watt Climes IMPRESSION -internal hemorrhoids -Diverticulosis in the sigmoid colon  -2 small polyps in the rectum and in the proximal transverse colon, removed with a hot snare. Resected and retrieved.  -3 medium polyps in the proximal transverse colon, in the mid transverse colon and in the distal transverse colon, removed and resected and retrieved.  -likely malignant partially obstructing tumor at the hepatic flexure. biopsied, tattooed.  -1 large polyp in the mid ascending colon  -the examination was otherwise normal     04/09/2019 Initial Biopsy   FINAL MICROSCOPIC DIAGNOSIS: 04/09/19 1. LG intestine-hepatic flexure, Biopsy:   INVASIVE WELL DIFFERENTIATED ADENOCARCINOMA   04/09/2019 Cancer Staging   Staging form: Colon and Rectum, AJCC 8th Edition - Clinical stage from 04/09/2019: Stage IVC (cTX, cNX, pM1c) - Signed by Truitt Merle, MD on 05/03/2019   04/10/2019 Imaging   CT AP 04/10/19  IMPRESSION: 1. There is an eccentric mass of the colon involving the ascending colon near the hepatic flexure measuring approximately 3.4 x 3.4 by 2.0 cm (series 2, image 37, series 3, image 37). There is extensive soft tissue nodularity of the  mesocolon and omentum, and likely areas of the peritoneum, for example bilateral upper quadrants (series 2, image 25). Findings are consistent with primary colon malignancy, probable omental and peritoneal involvement, and small volume malignant ascites. 2.  Other chronic and incidental findings as detailed above.   04/20/2019 Initial Diagnosis   Cancer of right colon ([place])   04/26/2019 Imaging   CT Chest 04/26/19 IMPRESSION: 1. Borderline to mild lower thoracic adenopathy, including within the right internal mammary and juxta cardiophrenic stations. Given the appearance of the upper abdomen, suspicious for nodal metastasis. 2. A low right paratracheal node is borderline sized, but favored to be reactive. 3. No evidence of pulmonary metastasis. 4. Peritoneal metastasis and abdominal ascites, as before. 5. Pancreatic parenchymal calcifications indicative of chronic calcific pancreatitis. 6. Coronary artery atherosclerosis.   04/27/2019 Pathology Results   Diagnosis 04/27/19 Peritoneum, biopsy, right upper quadrant, perihepatic - ADENOCARCINOMA, CONSISTENT WITH COLONIC PRIMARY. - SEE COMMENT.   05/16/2019 - 10/31/2019 Chemotherapy   First line FOLFOX every 2 weeks with Avastin starting with cycle 2 starting 05/16/19. 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 Stopped after 10/31/19 for surgery.    07/23/2019 Imaging    CT AP W Contrast  IMPRESSION: 1. Primary ascending colon mass may be minimally smaller. Peritoneal metastatic disease appears slightly improved as well. 2. Chronic calcific pancreatitis. 3. Tiny left renal stone. 4. Small left inguinal hernia contains a knuckle of unobstructed colon. 5. Enlarged prostate.   10/29/2019 Imaging   CT AP W Contrast IMPRESSION: No significant  change in small ascending colon  soft tissue mass and diffuse omental carcinomatosis.   No new or progressive metastatic disease identified.   Stable small left inguinal hernia containing a loop of sigmoid colon. No evidence of bowel obstruction or strangulation.   Colonic diverticulosis. No radiographic evidence of diverticulitis.   Stable enlarged prostate.   12/17/2019 Pathology Results   HIPEC Surgery by Dr Clovis Riley 12/17/19 FINAL PATHOLOGIC DIAGNOSIS  MICROSCOPIC EXAMINATION AND DIAGNOSIS  A.          "RIGHT COLON, OMENTUM, SPLEEN":       Invasive adenocarcinoma with mucinous features, moderately differentiated.  Tumor involves mesentery and spleen.  Five out of ten lymph nodes involved by adenocarcinoma with perinodal soft tissue involvement (5/10), see comment.  Margins are uninvolved by invasive carcinoma.  Ileum and appendix, uninvolved.  See comment and cancer case summary.  B.          "ROUND LIGAMENT OF LIVER":       Involved by adenocarcinoma with mucinous features, moderately differentiated.  C.          "RIGHT DIAPHRAM STRIPPING":       Negative for carcinoma.  D.          "RIGHT HEPATIC CAPSULECTOMY":       Chronic inflammation and fibrosis.  Negative for carcinoma.  E.          "DEBRIDED TUMOR":       Involved by adenocarcinoma with mucinous features, moderately differentiated.   COMMENT: Sections disclose five out of ten lymph nodes involved by adenocarcinoma with perinodal soft tissue involvement. However, it is not possible to be certain if this represents lymph nodes replaced by tumor and/or soft tissue involvement. In addition, focal perineural invasion is seen.   04/03/2020 Imaging   CT AP W contrast  IMPRESSION: 1. Status post interval splenectomy and right hemicolectomy. 2. Response to therapy of omental/peritoneal metastasis. The only residual indeterminate finding is fluid density along the capsule of the hepatic dome which could be postoperative or secondary to localized  residual peritoneal disease. 3. Left nephrolithiasis. 4.  Possible constipation. 5. Prostatomegaly. 6.  Aortic Atherosclerosis (ICD10-I70.0).   07/11/2020 Imaging   CT AP  1.  Postsurgical changes of subtle reductive surgery including right hemicolectomy, omentectomy, splenectomy, and right hepatic capsulectomy.  2.  Interval development of multiple areas of low attenuation involving the liver, as described above. This the intraparenchymal low-density lesion is concerning for metastatic disease. Other disease along the capsule may be post therapy related. Residual thickening of the anterior peritoneum particularly anterior to the right lobe of the liver is also concerning for some residual disease although appears significantly improved. Recommend attention on follow-up.  3.  No definite evidence of residual peritoneal disease status post HIPEC.   09/18/2020 -  Chemotherapy   Second line chemo FOLFIRI and bevacizumab q2weeks starting 09/18/20   12/19/2020 Imaging   CT CAP  IMPRESSION: 1. Interval development of a 1.6 x 2.7 x 1.6 cm collection of gas and debris between the sigmoid colon and dome of bladder, compatible with small abscess. A portion of this abscess is probably intramural at the dome of bladder. Marked associated thickening of the adjacent colonic and bladder walls with tiny gas bubbles in the thickened bladder wall and prominent amount of free gas in the bladder lumen suggests an associated colovesical fistula. Tiny gas bubbles are also seen in the central prostate gland and may be in the urethra although parenchymal gas within the prostate  gland is not excluded on this exam. 2. Nodularity along the major and minor fissures of the right hemithorax has decreased in the interval, nearly imperceptible today. 3. Peritoneal implants along the liver, right abdomen, and deep to the left paramidline anterior abdominal wall have resolved in the interval by CT imaging. 4. Tiny right  lower lobe pulmonary nodule is more conspicuous today. Attention on follow-up recommended. 5. Stable 4 mm hypodensity in the dome of the right liver, too small to characterize. 6. Nonobstructing left renal stones with bilateral renal cysts. 7. Stable compression deformity at multiple thoracic and lumbar levels. 8. Aortic Atherosclerosis (ICD10-I70.0).      CURRENT THERAPY:  Second line chemo FOLFIRI andbevacizumabq2weeks starting 09/18/20. Chemo onholdsince 12/23/20 due to fistula.  INTERVAL HISTORY:  Nathaniel Norton is here for a follow up. He presents to the clinic alone. He is clinically stable, still has moderate intermittent pelvic pain, for which he takes oxycodone every 6 hours.  He is taking antibiotics as needed, and has not been on it for the past week.  He still see fecal-like material and a bubble in his urine.  No fever, chills, or other new symptoms.  He has lost 8 pounds since a month ago.  He is able to function well at home.  He has appointment with Dr. Clovis Riley next week.  All other systems were reviewed with the patient and are negative.  MEDICAL HISTORY:  Past Medical History:  Diagnosis Date  . colon ca dx'd 03/2019    SURGICAL HISTORY: Past Surgical History:  Procedure Laterality Date  . CATARACT EXTRACTION    . IR IMAGING GUIDED PORT INSERTION  05/10/2019  . L4 fracture  2012  . RETINAL DETACHMENT SURGERY    . Right hand surgery  1974    I have reviewed the social history and family history with the patient and they are unchanged from previous note.  ALLERGIES:  is allergic to feraheme [ferumoxytol] and codeine.  MEDICATIONS:  Current Outpatient Medications  Medication Sig Dispense Refill  . lidocaine-prilocaine (EMLA) cream Apply 1 application topically as needed. 30 g 1  . ciprofloxacin (CIPRO) 500 MG tablet Take 1 tablet (500 mg total) by mouth 2 (two) times daily. 30 tablet 1  . diphenoxylate-atropine (LOMOTIL) 2.5-0.025 MG tablet Take 1-2  tablets by mouth 4 (four) times daily as needed for diarrhea or loose stools. 90 tablet 1  . ferrous sulfate 325 (65 FE) MG tablet Take 325 mg by mouth daily with breakfast.    . fluticasone (FLONASE) 50 MCG/ACT nasal spray Place into the nose.    . metroNIDAZOLE (FLAGYL) 500 MG tablet Take 1 tablet (500 mg total) by mouth 3 (three) times daily. 30 tablet 1  . mirtazapine (REMERON) 7.5 MG tablet Take 1 tablet (7.5 mg total) by mouth at bedtime. 30 tablet 1  . Multiple Vitamin (MULTIVITAMIN) tablet Take 1 tablet by mouth daily.    . ondansetron (ZOFRAN) 8 MG tablet Take 1 tablet (8 mg total) by mouth every 8 (eight) hours as needed for nausea or vomiting. Start on day 3 after chemotherapy 20 tablet 0  . oxyCODONE (OXY IR/ROXICODONE) 5 MG immediate release tablet Take 1 tablet (5 mg total) by mouth every 6 (six) hours as needed for severe pain. 120 tablet 0  . potassium chloride SA (KLOR-CON) 20 MEQ tablet Take 1 tablet (20 mEq total) by mouth 2 (two) times daily. 40 tablet 1  . prochlorperazine (COMPAZINE) 10 MG tablet Take 1 tablet (10 mg total)  by mouth every 6 (six) hours as needed (Nausea or vomiting). 30 tablet 2   No current facility-administered medications for this visit.    PHYSICAL EXAMINATION: ECOG PERFORMANCE STATUS: 2 - Symptomatic, <50% confined to bed  Vitals:   02/12/21 1413  BP: 107/71  Pulse: 90  Resp: 18  Temp: 98.7 F (37.1 C)  SpO2: 99%   Filed Weights   02/12/21 1413  Weight: 131 lb 4.8 oz (59.6 kg)    GENERAL:alert, no distress and comfortable SKIN: skin color, texture, turgor are normal, no rashes or significant lesions EYES: normal, Conjunctiva are pink and non-injected, sclera clear Musculoskeletal:no cyanosis of digits and no clubbing  NEURO: alert & oriented x 3 with fluent speech, no focal motor/sensory deficits  LABORATORY DATA:  I have reviewed the data as listed CBC Latest Ref Rng & Units 02/12/2021 01/23/2021 12/23/2020  WBC 4.0 - 10.5 K/uL 8.8 8.0  7.1  Hemoglobin 13.0 - 17.0 g/dL 12.8(L) 12.4(L) 11.7(L)  Hematocrit 39.0 - 52.0 % 39.0 38.3(L) 35.4(L)  Platelets 150 - 400 K/uL 345 305 360     CMP Latest Ref Rng & Units 02/12/2021 01/23/2021 12/23/2020  Glucose 70 - 99 mg/dL 147(H) 107(H) 140(H)  BUN 8 - 23 mg/dL _0 Creatinine 0.61 - 1.24 mg/dL 0.80 0.69 0.82  Sodium 135 - 145 mmol/L 136 135 139  Potassium 3.5 - 5.1 mmol/L 4.0 4.0 3.8  Chloride 98 - 111 mmol/L 101 100 103  CO2 22 - 32 mmol/L _1 Calcium 8.9 - 10.3 mg/dL 9.3 9.3 9.2  Total Protein 6.5 - 8.1 g/dL 7.3 7.3 7.0  Total Bilirubin 0.3 - 1.2 mg/dL 0.7 0.9 0.7  Alkaline Phos 38 - 126 U/L 79 73 79  AST 15 - 41 U/L 17 12(L) 11(L)  ALT 0 - 44 U/L _2 RADIOGRAPHIC STUDIES: I have personally reviewed the radiological images as listed and agreed with the findings in the report. No results found.   ASSESSMENT & PLAN:  Tyrelle Raczka is a 70 y.o. male with   1. 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 This has caused frequent UTI -This is likely related to hisperitoneal metastasis, chemotherapy and bevacizumab. Chemo has been held since 12/23/20  -He consulted with Dr Clovis Riley who discussed continuing antibiotics or proceed with surgery. He discussed surgical options. He may needremoval of bladder,prostate andpart ofsigmoid/rectum, with permanent colostomy.  -He continues Cipro and Flagyl with intermittent use. He will continue to watch for signs of infection.  -He uses oxycodone every 6 hours for his intermittent pelvic pain, which is overall controlled -He is interested in surgery which Dr Clovis Riley has offered, he has an appointment with Dr. Clovis Riley next week.  I gave him a copy of his recent CT imaging from our facility. -I will continue holding chemo for now due to  his colovesicle fistular, and associated pelvis abscess and chronic UTI. I recommend him to consider surgery (even just simple diverting colostomy).  -Obtain urine culture today, he is currently not on antibiotics this weeks, which he will restart if he develops dysuria    2. Cancer of right colon,with peritonealand lungmetastasis,stage IV,MMR normal, KRAS mutation (+), MSS -He wasdiagnosed in 03/2019. His Colonoscopy biopsy showed adenocarcinoma of right hepatic flexurecolon.His peritoneal biopsy from 04/27/19 showsadenocarcinoma,consistent withmetastasis  of his colon cancer. This is now stage IV disease. -He was treated with first-line FOLFOX in June-November 2020 before proceeding with HIPECsurgeryby Dr. Dustin Folks 12/17/19. -Unfortunately he had disease progression on 09/10/20 PET withhypermetabolic pleural (right)and peritoneal nodules, which are consistent with metastatic disease.I do not think we need biopsy to confirm given his previously known peritoneal metastasis -Istarted him onsecond-line chemo withFOLFIRI and bevacizumab q2weekson 09/18/20. 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  -He is considering Fistula surgery although this will likely result in permanent ostomy bag(s). -Will hold chemo while he has active abscess infection. 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 Watt Climes showed internal hemorrhoids, benign polyps, diverticulosis and colon cancer.  -He was treated with oral iron,IV Feraheme on 05/03/19 and 05/16/19(had reaction with 2nd dose). -With cancer recurrence his anemia recurred.Overall mild  4. Elevated PSA  -PSA was 5.7 in 11/2018 and 4.6 in  01/2019. He has known enlarged prostate. 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 Possible surgery may include removal of bladder and prostate. He will consult with urologist soon.  5.Goal of care discussion  -The patient understands the goal of care is palliative. -I have recommended DNR, he will think about it    PLAN: -lab reviewed -he is clinically stable, will continue antibiotics as needed, and oxycodone for his pain.  I refilled a month supply of oxycodone today, which has been approved by his insurance -He has appointment with Dr. Clovis Riley next week -Lab and follow-up in 4 weeks   No problem-specific Assessment & Plan notes found for this encounter.   No orders of the defined types were placed in this encounter.  All questions were answered. The patient knows to call the clinic with any problems, questions or concerns. No barriers to learning was detected. The total time spent in the appointment was 30 minutes.     Truitt Merle, MD 02/14/2021   I, Joslyn Devon, am acting as scribe for Truitt Merle, MD.   I have reviewed the above documentation for accuracy and completeness, and I agree with the above.

## 2021-02-12 ENCOUNTER — Inpatient Hospital Stay (HOSPITAL_BASED_OUTPATIENT_CLINIC_OR_DEPARTMENT_OTHER): Payer: Medicare Other | Admitting: Hematology

## 2021-02-12 ENCOUNTER — Other Ambulatory Visit: Payer: Self-pay

## 2021-02-12 ENCOUNTER — Inpatient Hospital Stay: Payer: Medicare Other

## 2021-02-12 ENCOUNTER — Inpatient Hospital Stay: Payer: Medicare Other | Attending: Hematology

## 2021-02-12 VITALS — BP 107/71 | HR 90 | Temp 98.7°F | Resp 18 | Ht 66.0 in | Wt 131.3 lb

## 2021-02-12 DIAGNOSIS — D500 Iron deficiency anemia secondary to blood loss (chronic): Secondary | ICD-10-CM

## 2021-02-12 DIAGNOSIS — R972 Elevated prostate specific antigen [PSA]: Secondary | ICD-10-CM | POA: Diagnosis not present

## 2021-02-12 DIAGNOSIS — N390 Urinary tract infection, site not specified: Secondary | ICD-10-CM | POA: Insufficient documentation

## 2021-02-12 DIAGNOSIS — R809 Proteinuria, unspecified: Secondary | ICD-10-CM

## 2021-02-12 DIAGNOSIS — K651 Peritoneal abscess: Secondary | ICD-10-CM | POA: Insufficient documentation

## 2021-02-12 DIAGNOSIS — N400 Enlarged prostate without lower urinary tract symptoms: Secondary | ICD-10-CM | POA: Diagnosis not present

## 2021-02-12 DIAGNOSIS — C786 Secondary malignant neoplasm of retroperitoneum and peritoneum: Secondary | ICD-10-CM | POA: Insufficient documentation

## 2021-02-12 DIAGNOSIS — Z7189 Other specified counseling: Secondary | ICD-10-CM | POA: Diagnosis not present

## 2021-02-12 DIAGNOSIS — K632 Fistula of intestine: Secondary | ICD-10-CM | POA: Diagnosis not present

## 2021-02-12 DIAGNOSIS — C182 Malignant neoplasm of ascending colon: Secondary | ICD-10-CM | POA: Diagnosis not present

## 2021-02-12 DIAGNOSIS — Z9221 Personal history of antineoplastic chemotherapy: Secondary | ICD-10-CM | POA: Insufficient documentation

## 2021-02-12 DIAGNOSIS — Z95828 Presence of other vascular implants and grafts: Secondary | ICD-10-CM

## 2021-02-12 DIAGNOSIS — N3000 Acute cystitis without hematuria: Secondary | ICD-10-CM

## 2021-02-12 LAB — CBC WITH DIFFERENTIAL (CANCER CENTER ONLY)
Abs Immature Granulocytes: 0.01 10*3/uL (ref 0.00–0.07)
Basophils Absolute: 0.1 10*3/uL (ref 0.0–0.1)
Basophils Relative: 1 %
Eosinophils Absolute: 0.1 10*3/uL (ref 0.0–0.5)
Eosinophils Relative: 1 %
HCT: 39 % (ref 39.0–52.0)
Hemoglobin: 12.8 g/dL — ABNORMAL LOW (ref 13.0–17.0)
Immature Granulocytes: 0 %
Lymphocytes Relative: 31 %
Lymphs Abs: 2.8 10*3/uL (ref 0.7–4.0)
MCH: 28.8 pg (ref 26.0–34.0)
MCHC: 32.8 g/dL (ref 30.0–36.0)
MCV: 87.6 fL (ref 80.0–100.0)
Monocytes Absolute: 0.9 10*3/uL (ref 0.1–1.0)
Monocytes Relative: 10 %
Neutro Abs: 5 10*3/uL (ref 1.7–7.7)
Neutrophils Relative %: 57 %
Platelet Count: 345 10*3/uL (ref 150–400)
RBC: 4.45 MIL/uL (ref 4.22–5.81)
RDW: 15.6 % — ABNORMAL HIGH (ref 11.5–15.5)
WBC Count: 8.8 10*3/uL (ref 4.0–10.5)
nRBC: 0 % (ref 0.0–0.2)

## 2021-02-12 LAB — TOTAL PROTEIN, URINE DIPSTICK: Protein, ur: NEGATIVE mg/dL

## 2021-02-12 LAB — CMP (CANCER CENTER ONLY)
ALT: 11 U/L (ref 0–44)
AST: 17 U/L (ref 15–41)
Albumin: 3.4 g/dL — ABNORMAL LOW (ref 3.5–5.0)
Alkaline Phosphatase: 79 U/L (ref 38–126)
Anion gap: 10 (ref 5–15)
BUN: 8 mg/dL (ref 8–23)
CO2: 25 mmol/L (ref 22–32)
Calcium: 9.3 mg/dL (ref 8.9–10.3)
Chloride: 101 mmol/L (ref 98–111)
Creatinine: 0.8 mg/dL (ref 0.61–1.24)
GFR, Estimated: >60 mL/min (ref >60)
Glucose, Bld: 147 mg/dL — ABNORMAL HIGH (ref 70–99)
Potassium: 4 mmol/L (ref 3.5–5.1)
Sodium: 136 mmol/L (ref 135–145)
Total Bilirubin: 0.7 mg/dL (ref 0.3–1.2)
Total Protein: 7.3 g/dL (ref 6.5–8.1)

## 2021-02-12 MED ORDER — OXYCODONE HCL 5 MG PO TABS: 5.0000 mg | ORAL_TABLET | Freq: Four times a day (QID) | ORAL | 0 refills | Status: DC | PRN

## 2021-02-12 MED ORDER — HEPARIN SOD (PORK) LOCK FLUSH 100 UNIT/ML IV SOLN
500.0000 [IU] | Freq: Once | INTRAVENOUS | Status: AC | PRN
  Administered 2021-02-12: 500 [IU]
  Filled 2021-02-12: qty 5

## 2021-02-12 MED ORDER — SODIUM CHLORIDE 0.9% FLUSH
10.0000 mL | INTRAVENOUS | Status: DC | PRN
  Administered 2021-02-12: 10 mL
  Filled 2021-02-12: qty 10

## 2021-02-12 MED ORDER — LIDOCAINE-PRILOCAINE 2.5-2.5 % EX CREA: 1.0000 "application " | TOPICAL_CREAM | CUTANEOUS | 1 refills | Status: AC | PRN

## 2021-02-12 NOTE — Patient Instructions (Signed)
Implanted Port Insertion, Care After This sheet gives you information about how to care for yourself after your procedure. Your health care provider may also give you more specific instructions. If you have problems or questions, contact your health care provider. What can I expect after the procedure? After the procedure, it is common to have:  Discomfort at the port insertion site.  Bruising on the skin over the port. This should improve over 3-4 days. Follow these instructions at home: Port care  After your port is placed, you will get a manufacturer's information card. The card has information about your port. Keep this card with you at all times.  Take care of the port as told by your health care provider. Ask your health care provider if you or a family member can get training for taking care of the port at home. A home health care nurse may also take care of the port.  Make sure to remember what type of port you have. Incision care  Follow instructions from your health care provider about how to take care of your port insertion site. Make sure you: ? Wash your hands with soap and water before and after you change your bandage (dressing). If soap and water are not available, use hand sanitizer. ? Change your dressing as told by your health care provider. ? Leave stitches (sutures), skin glue, or adhesive strips in place. These skin closures may need to stay in place for 2 weeks or longer. If adhesive strip edges start to loosen and curl up, you may trim the loose edges. Do not remove adhesive strips completely unless your health care provider tells you to do that.  Check your port insertion site every day for signs of infection. Check for: ? Redness, swelling, or pain. ? Fluid or blood. ? Warmth. ? Pus or a bad smell.      Activity  Return to your normal activities as told by your health care provider. Ask your health care provider what activities are safe for you.  Do not  lift anything that is heavier than 10 lb (4.5 kg), or the limit that you are told, until your health care provider says that it is safe. General instructions  Take over-the-counter and prescription medicines only as told by your health care provider.  Do not take baths, swim, or use a hot tub until your health care provider approves. Ask your health care provider if you may take showers. You may only be allowed to take sponge baths.  Do not drive for 24 hours if you were given a sedative during your procedure.  Wear a medical alert bracelet in case of an emergency. This will tell any health care providers that you have a port.  Keep all follow-up visits as told by your health care provider. This is important. Contact a health care provider if:  You cannot flush your port with saline as directed, or you cannot draw blood from the port.  You have a fever or chills.  You have redness, swelling, or pain around your port insertion site.  You have fluid or blood coming from your port insertion site.  Your port insertion site feels warm to the touch.  You have pus or a bad smell coming from the port insertion site. Get help right away if:  You have chest pain or shortness of breath.  You have bleeding from your port that you cannot control. Summary  Take care of the port as told by your   health care provider. Keep the manufacturer's information card with you at all times.  Change your dressing as told by your health care provider.  Contact a health care provider if you have a fever or chills or if you have redness, swelling, or pain around your port insertion site.  Keep all follow-up visits as told by your health care provider. This information is not intended to replace advice given to you by your health care provider. Make sure you discuss any questions you have with your health care provider. Document Revised: 06/27/2018 Document Reviewed: 06/27/2018 Elsevier Patient Education   2021 Elsevier Inc.  

## 2021-02-13 ENCOUNTER — Telehealth: Payer: Self-pay | Admitting: Hematology

## 2021-02-13 LAB — URINE CULTURE: Culture: NO GROWTH

## 2021-02-13 NOTE — Telephone Encounter (Signed)
Scheduled follow-up appointment per 3/3 los. Patient is aware. 

## 2021-02-14 ENCOUNTER — Encounter: Payer: Self-pay | Admitting: Hematology

## 2021-02-18 DIAGNOSIS — C189 Malignant neoplasm of colon, unspecified: Secondary | ICD-10-CM | POA: Diagnosis not present

## 2021-02-18 DIAGNOSIS — C182 Malignant neoplasm of ascending colon: Secondary | ICD-10-CM | POA: Diagnosis not present

## 2021-02-18 DIAGNOSIS — C786 Secondary malignant neoplasm of retroperitoneum and peritoneum: Secondary | ICD-10-CM | POA: Diagnosis not present

## 2021-02-18 DIAGNOSIS — C787 Secondary malignant neoplasm of liver and intrahepatic bile duct: Secondary | ICD-10-CM | POA: Diagnosis not present

## 2021-02-18 DIAGNOSIS — N321 Vesicointestinal fistula: Secondary | ICD-10-CM | POA: Diagnosis not present

## 2021-02-24 DIAGNOSIS — K632 Fistula of intestine: Secondary | ICD-10-CM | POA: Diagnosis not present

## 2021-02-24 DIAGNOSIS — C786 Secondary malignant neoplasm of retroperitoneum and peritoneum: Secondary | ICD-10-CM | POA: Diagnosis not present

## 2021-02-24 DIAGNOSIS — C772 Secondary and unspecified malignant neoplasm of intra-abdominal lymph nodes: Secondary | ICD-10-CM | POA: Diagnosis not present

## 2021-02-24 DIAGNOSIS — C189 Malignant neoplasm of colon, unspecified: Secondary | ICD-10-CM | POA: Diagnosis not present

## 2021-02-24 DIAGNOSIS — N321 Vesicointestinal fistula: Secondary | ICD-10-CM | POA: Diagnosis not present

## 2021-02-24 DIAGNOSIS — C188 Malignant neoplasm of overlapping sites of colon: Secondary | ICD-10-CM | POA: Diagnosis not present

## 2021-02-24 DIAGNOSIS — Z9221 Personal history of antineoplastic chemotherapy: Secondary | ICD-10-CM | POA: Diagnosis not present

## 2021-02-24 DIAGNOSIS — K604 Rectal fistula: Secondary | ICD-10-CM | POA: Diagnosis not present

## 2021-02-24 DIAGNOSIS — K660 Peritoneal adhesions (postprocedural) (postinfection): Secondary | ICD-10-CM | POA: Diagnosis not present

## 2021-02-24 DIAGNOSIS — R0602 Shortness of breath: Secondary | ICD-10-CM | POA: Diagnosis not present

## 2021-03-18 NOTE — Progress Notes (Signed)
Nathaniel Norton   Telephone:([phone] Fax:(336) [phone]   Clinic Follow up Note   Patient Care Team: Lajean Manes, MD as PCP - General (Internal Medicine) Berle Mull, MD as Consulting Physician (Family Medicine) Clarene Essex, MD as Consulting Physician (Gastroenterology) Truitt Merle, MD as Consulting Physician (Oncology)  Date of Service:  03/20/2021  CHIEF COMPLAINT: F/u ofmetastaticcolon cancer  SUMMARY OF ONCOLOGIC HISTORY: Oncology History Overview Note  Cancer Staging Cancer of right colon [facility]) Staging form: Colon and Rectum, AJCC 8th Edition - Clinical stage from 04/09/2019: Stage IVC (cTX, cNX, pM1c) - Signed by Truitt Merle, MD on 05/03/2019     Cancer of right colon [facility])  04/09/2019 Procedure   Colonoscopy 04/09/19 by Dr Watt Climes IMPRESSION -internal hemorrhoids -Diverticulosis in the sigmoid colon  -2 small polyps in the rectum and in the proximal transverse colon, removed with a hot snare. Resected and retrieved.  -3 medium polyps in the proximal transverse colon, in the mid transverse colon and in the distal transverse colon, removed and resected and retrieved.  -likely malignant partially obstructing tumor at the hepatic flexure. biopsied, tattooed.  -1 large polyp in the mid ascending colon  -the examination was otherwise normal     04/09/2019 Initial Biopsy   FINAL MICROSCOPIC DIAGNOSIS: 04/09/19 1. LG intestine-hepatic flexure, Biopsy:   INVASIVE WELL DIFFERENTIATED ADENOCARCINOMA   04/09/2019 Cancer Staging   Staging form: Colon and Rectum, AJCC 8th Edition - Clinical stage from 04/09/2019: Stage IVC (cTX, cNX, pM1c) - Signed by Truitt Merle, MD on 05/03/2019   04/10/2019 Imaging   CT AP 04/10/19  IMPRESSION: 1. There is an eccentric mass of the colon involving the ascending colon near the hepatic flexure measuring approximately 3.4 x 3.4 by 2.0 cm (series 2, image 37, series 3, image 37). There is extensive soft tissue nodularity of the  mesocolon and omentum, and likely areas of the peritoneum, for example bilateral upper quadrants (series 2, image 25). Findings are consistent with primary colon malignancy, probable omental and peritoneal involvement, and small volume malignant ascites. 2.  Other chronic and incidental findings as detailed above.   04/20/2019 Initial Diagnosis   Cancer of right colon ([place])   04/26/2019 Imaging   CT Chest 04/26/19 IMPRESSION: 1. Borderline to mild lower thoracic adenopathy, including within the right internal mammary and juxta cardiophrenic stations. Given the appearance of the upper abdomen, suspicious for nodal metastasis. 2. A low right paratracheal node is borderline sized, but favored to be reactive. 3. No evidence of pulmonary metastasis. 4. Peritoneal metastasis and abdominal ascites, as before. 5. Pancreatic parenchymal calcifications indicative of chronic calcific pancreatitis. 6. Coronary artery atherosclerosis.   04/27/2019 Pathology Results   Diagnosis 04/27/19 Peritoneum, biopsy, right upper quadrant, perihepatic - ADENOCARCINOMA, CONSISTENT WITH COLONIC PRIMARY. - SEE COMMENT.   05/16/2019 - 10/31/2019 Chemotherapy   First line FOLFOX every 2 weeks with Avastin starting with cycle 2 starting 05/16/19. 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 Stopped after 10/31/19 for surgery.    07/23/2019 Imaging    CT AP W Contrast  IMPRESSION: 1. Primary ascending colon mass may be minimally smaller. Peritoneal metastatic disease appears slightly improved as well. 2. Chronic calcific pancreatitis. 3. Tiny left renal stone. 4. Small left inguinal hernia contains a knuckle of unobstructed colon. 5. Enlarged prostate.   10/29/2019 Imaging   CT AP W Contrast IMPRESSION: No significant  change in small ascending colon  soft tissue mass and diffuse omental carcinomatosis.   No new or progressive metastatic disease identified.   Stable small left inguinal hernia containing a loop of sigmoid colon. No evidence of bowel obstruction or strangulation.   Colonic diverticulosis. No radiographic evidence of diverticulitis.   Stable enlarged prostate.   12/17/2019 Pathology Results   HIPEC Surgery by Dr Clovis Riley 12/17/19 FINAL PATHOLOGIC DIAGNOSIS  MICROSCOPIC EXAMINATION AND DIAGNOSIS  A.          "RIGHT COLON, OMENTUM, SPLEEN":       Invasive adenocarcinoma with mucinous features, moderately differentiated.  Tumor involves mesentery and spleen.  Five out of ten lymph nodes involved by adenocarcinoma with perinodal soft tissue involvement (5/10), see comment.  Margins are uninvolved by invasive carcinoma.  Ileum and appendix, uninvolved.  See comment and cancer case summary.  B.          "ROUND LIGAMENT OF LIVER":       Involved by adenocarcinoma with mucinous features, moderately differentiated.  C.          "RIGHT DIAPHRAM STRIPPING":       Negative for carcinoma.  D.          "RIGHT HEPATIC CAPSULECTOMY":       Chronic inflammation and fibrosis.  Negative for carcinoma.  E.          "DEBRIDED TUMOR":       Involved by adenocarcinoma with mucinous features, moderately differentiated.   COMMENT: Sections disclose five out of ten lymph nodes involved by adenocarcinoma with perinodal soft tissue involvement. However, it is not possible to be certain if this represents lymph nodes replaced by tumor and/or soft tissue involvement. In addition, focal perineural invasion is seen.   04/03/2020 Imaging   CT AP W contrast  IMPRESSION: 1. Status post interval splenectomy and right hemicolectomy. 2. Response to therapy of omental/peritoneal metastasis. The only residual indeterminate finding is fluid density along the capsule of the hepatic dome which could be postoperative or secondary to localized  residual peritoneal disease. 3. Left nephrolithiasis. 4.  Possible constipation. 5. Prostatomegaly. 6.  Aortic Atherosclerosis (ICD10-I70.0).   07/11/2020 Imaging   CT AP  1.  Postsurgical changes of subtle reductive surgery including right hemicolectomy, omentectomy, splenectomy, and right hepatic capsulectomy.  2.  Interval development of multiple areas of low attenuation involving the liver, as described above. This the intraparenchymal low-density lesion is concerning for metastatic disease. Other disease along the capsule may be post therapy related. Residual thickening of the anterior peritoneum particularly anterior to the right lobe of the liver is also concerning for some residual disease although appears significantly improved. Recommend attention on follow-up.  3.  No definite evidence of residual peritoneal disease status post HIPEC.   09/18/2020 -  Chemotherapy   Second line chemo FOLFIRI and bevacizumab q2weeks starting 09/18/20   12/19/2020 Imaging   CT CAP  IMPRESSION: 1. Interval development of a 1.6 x 2.7 x 1.6 cm collection of gas and debris between the sigmoid colon and dome of bladder, compatible with small abscess. A portion of this abscess is probably intramural at the dome of bladder. Marked associated thickening of the adjacent colonic and bladder walls with tiny gas bubbles in the thickened bladder wall and prominent amount of free gas in the bladder lumen suggests an associated colovesical fistula. Tiny gas bubbles are also seen in the central prostate gland and may be in the urethra although parenchymal gas within the prostate  gland is not excluded on this exam. 2. Nodularity along the major and minor fissures of the right hemithorax has decreased in the interval, nearly imperceptible today. 3. Peritoneal implants along the liver, right abdomen, and deep to the left paramidline anterior abdominal wall have resolved in the interval by CT imaging. 4. Tiny right  lower lobe pulmonary nodule is more conspicuous today. Attention on follow-up recommended. 5. Stable 4 mm hypodensity in the dome of the right liver, too small to characterize. 6. Nonobstructing left renal stones with bilateral renal cysts. 7. Stable compression deformity at multiple thoracic and lumbar levels. 8. Aortic Atherosclerosis (ICD10-I70.0).   02/24/2021 Surgery   LAPAROTOMY EXPLORATORY resection of sigmoid colon and proximal rectum, takedown of colovesical fistula, creation of end ileosotmy by Dr Clovis Riley    Final Pathologic Diagnosis      A.  SIGMOID COLON AND RECTUM, RESECTION:               Invasive adenocarcinoma with mucinous features, moderately differentiated.               Tumor measures 3.2 cm in greatest dimension. Tumor invades visceral peritoneum.  Lymphovascular invasion is present.               Metastatic adenocarcinoma involving seven (of 22) lymph nodes (7/22).  One margin involved by adenocarcinoma (grossly presumed distal margin).        CURRENT THERAPY:  Second line chemo FOLFIRI andbevacizumabq2weeks starting 09/18/20. Chemo onholdsince 12/23/20 due to fistula.Plan to restart with low dose irinotecan in 2 weeks.   INTERVAL HISTORY:  Nathaniel Norton is here for a follow up. He was last seen by me 1 month ago. He presents to the clinic alone. He notes he had surgery on fistula on 02/24/21. He notes he was hospitalized for 8 days. He has moderate incisions. He denies any more rectal discharge. He denies anymore dysuria and urination is adequate. He notes he still has frustrations with waiting for so long to get surgery done. He notes he would like better communication about his treatment doses and how they are changing by his tolerance. He notes he needs prescription to get more ostomy supplies. I reviewed his medication list with him.  He notes for at least in the past 1.5 months he has not had trouble swallowing except when he tilts his head back. He  notes mild neck pain in certain positions.     REVIEW OF SYSTEMS:   Constitutional: Denies fevers, chills or abnormal weight loss Eyes: Denies blurriness of vision Ears, nose, mouth, throat, and face: Denies mucositis or sore throat (+) Positional dysphagia  Respiratory: Denies cough, dyspnea or wheezes Cardiovascular: Denies palpitation, chest discomfort or lower extremity swelling Gastrointestinal:  Denies nausea, heartburn or change in bowel habits Skin: Denies abnormal skin rashes MSK: (+) Mild neck pain  Lymphatics: Denies new lymphadenopathy or easy bruising Neurological:Denies numbness, tingling or new weaknesses Behavioral/Psych: Mood is stable, no new changes  All other systems were reviewed with the patient and are negative.   MEDICAL HISTORY:  Past Medical History:  Diagnosis Date  . colon ca dx'd 03/2019    SURGICAL HISTORY: Past Surgical History:  Procedure Laterality Date  . CATARACT EXTRACTION    . IR IMAGING GUIDED PORT INSERTION  05/10/2019  . L4 fracture  2012  . RETINAL DETACHMENT SURGERY    . Right hand surgery  1974    I have reviewed the social history and family history with the patient and they  are unchanged from previous note.  ALLERGIES:  is allergic to feraheme [ferumoxytol] and codeine.  MEDICATIONS:  Current Outpatient Medications  Medication Sig Dispense Refill  . ciprofloxacin (CIPRO) 500 MG tablet Take 1 tablet (500 mg total) by mouth 2 (two) times daily. 30 tablet 1  . diphenoxylate-atropine (LOMOTIL) 2.5-0.025 MG tablet Take 1-2 tablets by mouth 4 (four) times daily as needed for diarrhea or loose stools. 90 tablet 1  . ferrous sulfate 325 (65 FE) MG tablet Take 325 mg by mouth daily with breakfast.    . fluticasone (FLONASE) 50 MCG/ACT nasal spray Place into the nose.    . lidocaine-prilocaine (EMLA) cream Apply 1 application topically as needed. 30 g 1  . metroNIDAZOLE (FLAGYL) 500 MG tablet Take 1 tablet (500 mg total) by mouth 3  (three) times daily. 30 tablet 1  . mirtazapine (REMERON) 7.5 MG tablet Take 1 tablet (7.5 mg total) by mouth at bedtime. 30 tablet 1  . Multiple Vitamin (MULTIVITAMIN) tablet Take 1 tablet by mouth daily.    . ondansetron (ZOFRAN) 8 MG tablet Take 1 tablet (8 mg total) by mouth every 8 (eight) hours as needed for nausea or vomiting. Start on day 3 after chemotherapy 20 tablet 0  . oxyCODONE (OXY IR/ROXICODONE) 5 MG immediate release tablet Take 1 tablet (5 mg total) by mouth every 6 (six) hours as needed for severe pain. 90 tablet 0  . potassium chloride SA (KLOR-CON) 20 MEQ tablet Take 1 tablet (20 mEq total) by mouth 2 (two) times daily. 40 tablet 1  . prochlorperazine (COMPAZINE) 10 MG tablet Take 1 tablet (10 mg total) by mouth every 6 (six) hours as needed (Nausea or vomiting). 30 tablet 2   No current facility-administered medications for this visit.    PHYSICAL EXAMINATION: ECOG PERFORMANCE STATUS: 2 - Symptomatic, <50% confined to bed  Vitals:   03/20/21 1127  BP: 122/84  Pulse: 88  Resp: 18  Temp: (!) 97.2 F (36.2 C)  SpO2: 100%   Filed Weights   03/20/21 1127  Weight: 126 lb (57.2 kg)    GENERAL:alert, no distress and comfortable SKIN: skin color, texture, turgor are normal, no rashes or significant lesions EYES: normal, Conjunctiva are pink and non-injected, sclera clear  NECK: supple, thyroid normal size, non-tender, without nodularity LYMPH:  no palpable lymphadenopathy in the cervical, axillary  LUNGS: clear to auscultation and percussion with normal breathing effort HEART: regular rate & rhythm and no murmurs and no lower extremity edema ABDOMEN:abdomen soft, non-tender and normal bowel sounds (+) Surgical incisions, healed well except minimal opening near colostomy site. (+) colostomy bag in place  Musculoskeletal:no cyanosis of digits and no clubbing  NEURO: alert & oriented x 3 with fluent speech, no focal motor/sensory deficits  LABORATORY DATA:  I have  reviewed the data as listed CBC Latest Ref Rng & Units 03/20/2021 02/12/2021 01/23/2021  WBC 4.0 - 10.5 K/uL 7.9 8.8 8.0  Hemoglobin 13.0 - 17.0 g/dL 12.4(L) 12.8(L) 12.4(L)  Hematocrit 39.0 - 52.0 % 38.0(L) 39.0 38.3(L)  Platelets 150 - 400 K/uL 337 345 305     CMP Latest Ref Rng & Units 02/12/2021 01/23/2021 12/23/2020  Glucose 70 - 99 mg/dL 147(H) 107(H) 140(H)  BUN 8 - 23 mg/dL '8 8 8  ' Creatinine 0.61 - 1.24 mg/dL 0.80 0.69 0.82  Sodium 135 - 145 mmol/L 136 135 139  Potassium 3.5 - 5.1 mmol/L 4.0 4.0 3.8  Chloride 98 - 111 mmol/L 101 100 103  CO2 22 -  32 mmol/L '25 27 29  ' Calcium 8.9 - 10.3 mg/dL 9.3 9.3 9.2  Total Protein 6.5 - 8.1 g/dL 7.3 7.3 7.0  Total Bilirubin 0.3 - 1.2 mg/dL 0.7 0.9 0.7  Alkaline Phos 38 - 126 U/L 79 73 79  AST 15 - 41 U/L 17 12(L) 11(L)  ALT 0 - 44 U/L '11 8 10      ' RADIOGRAPHIC STUDIES: I have personally reviewed the radiological images as listed and agreed with the findings in the report. No results found.   ASSESSMENT & PLAN:  Treydon Henricks is a 70 y.o. male with   1. Cancer of right colon,with peritonealand lungmetastasis,stage IV,MMR normal, KRAS mutation (+), MSS -He wasdiagnosed in 03/2019. His Colonoscopy biopsy showed adenocarcinoma of right hepatic flexurecolon.His peritoneal biopsy from 04/27/19 showsadenocarcinoma,consistent withmetastasis of his colon cancer. This is now stage IV disease. -He was treated with first-line FOLFOX in June-November 2020 before proceeding with HIPECsurgeryby Dr. Dustin Folks 12/17/19. -Unfortunately he had disease progression on 09/10/20 PET withhypermetabolic pleural (right)and peritoneal nodules, which are consistent with metastatic disease.I do not think we need biopsy to confirm given his previously known peritoneal metastasis.  -Istarted him onsecond-line chemo withFOLFIRI and bevacizumab q2weekson 09/18/20. 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  -He was able to undergo diverting colostomy surgery by Dr Clovis Riley on 02/24/21. His urinary symptoms have improved. He is still interested in chemotherapy to treatment his cancer.  -I discussed getting new baseline CT scan to evaluate his cancer before restarting chemotherapy. He is agreeable. I recommend restarting him on single agent irinotecan. If tolerable may add back 5FU.  -Patient notes his concerns with communications with his physicians. We had lengthy discussion with him today about improvement and expectations.  -He notes positional dysphagia when head is tilted back. Will monitor.  -F/u in 2 weeks with restart of irinotecan.  -Due to his fistula, he is not a candidate for bevacizumab.   2. 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 This has caused frequent UTI -This is likely related to hisperitoneal metastasis, chemotherapy and bevacizumab. Chemo has been held since 12/23/20  -He consulted with Dr Clovis Riley who discussed continuing antibiotics or proceed with surgery.  -He proceeded with fistula takedown surgery with Dr Clovis Riley on 02/24/21. He has ileostomy to by pass rectum. He has no more rectal output or dysuria. He is urinating adequately again.  -I discussed given his cancer is still present, there is a risk of another fistula occurring. We will monitor.  -For current abdominal pain he is taking Oxycodone 78m q6hours. He tries to reduce but not manageable. I refilled (03/20/21) to be picked up on (4/13).  -He will f/u with Dr LClovis Rileynext week. He will request more support of his ostomy supplies.  -He has lost about 5 pounds since latest surgery. I recommend he continue Ensure and  increased protein/calorie intake. He tolerated Ensure clear well.   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 MWatt Climesshowed internal hemorrhoids, benign polyps, diverticulosis and colon cancer.  -He was treated with oral iron,IV Feraheme on 05/03/19 and 05/16/19(had reaction with 2nd dose). -With cancer recurrence his anemia recurred.Overall mild  4. Elevated  PSA  -PSA was 5.7 in 11/2018 and 4.6 in 01/2019. He has known enlarged prostate. 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 Possible surgery may include removal of bladder and prostate. He will consult with urologist soon.  5.Goal of care discussion  -The patient understands the goal of care is palliative. -I have recommended DNR, he will think about it   PLAN: -I refilled Oxycodone to be picked up next week 4/13 -CT CAP w contrast next week  -Lab, flush, f/u and irinotecan in 2 and 4 weeks     No problem-specific Assessment & Plan notes found for this encounter.   Orders Placed This Encounter  Procedures  . CT CHEST ABDOMEN PELVIS W CONTRAST    Standing Status:   Future    Standing Expiration Date:   03/20/2022    Order Specific Question:   If indicated for the ordered procedure, I authorize the administration of contrast media per Radiology protocol    Answer:   Yes    Order Specific Question:   Preferred imaging location?    Answer:   [facility]    Order Specific Question:   Release to patient    Answer:   Immediate    Order Specific Question:   Is Oral Contrast requested for this exam?    Answer:   Yes, Per Radiology protocol    Order Specific Question:   Reason for Exam (SYMPTOM  OR DIAGNOSIS REQUIRED)    Answer:   restaging, pt has been off chemo since 12/10/2020   All questions were answered. The patient knows to call the clinic with any problems,  questions or concerns. No barriers to learning was detected. The total time spent in the appointment was 30 minutes.     Truitt Merle, MD 03/20/2021   I, Joslyn Devon, am acting as scribe for Truitt Merle, MD.   I have reviewed the above documentation for accuracy and completeness, and I agree with the above.

## 2021-03-20 ENCOUNTER — Inpatient Hospital Stay: Payer: Medicare Other | Attending: Hematology | Admitting: Hematology

## 2021-03-20 ENCOUNTER — Inpatient Hospital Stay: Payer: Medicare Other

## 2021-03-20 ENCOUNTER — Other Ambulatory Visit: Payer: Self-pay

## 2021-03-20 VITALS — BP 122/84 | HR 88 | Temp 97.2°F | Resp 18 | Ht 66.0 in | Wt 126.0 lb

## 2021-03-20 DIAGNOSIS — N390 Urinary tract infection, site not specified: Secondary | ICD-10-CM | POA: Insufficient documentation

## 2021-03-20 DIAGNOSIS — D500 Iron deficiency anemia secondary to blood loss (chronic): Secondary | ICD-10-CM | POA: Diagnosis not present

## 2021-03-20 DIAGNOSIS — C182 Malignant neoplasm of ascending colon: Secondary | ICD-10-CM | POA: Diagnosis not present

## 2021-03-20 DIAGNOSIS — C786 Secondary malignant neoplasm of retroperitoneum and peritoneum: Secondary | ICD-10-CM | POA: Diagnosis not present

## 2021-03-20 DIAGNOSIS — Z5111 Encounter for antineoplastic chemotherapy: Secondary | ICD-10-CM | POA: Insufficient documentation

## 2021-03-20 DIAGNOSIS — R972 Elevated prostate specific antigen [PSA]: Secondary | ICD-10-CM | POA: Diagnosis not present

## 2021-03-20 DIAGNOSIS — Z9221 Personal history of antineoplastic chemotherapy: Secondary | ICD-10-CM | POA: Diagnosis not present

## 2021-03-20 DIAGNOSIS — K651 Peritoneal abscess: Secondary | ICD-10-CM | POA: Diagnosis not present

## 2021-03-20 DIAGNOSIS — Z95828 Presence of other vascular implants and grafts: Secondary | ICD-10-CM

## 2021-03-20 DIAGNOSIS — R809 Proteinuria, unspecified: Secondary | ICD-10-CM

## 2021-03-20 LAB — CBC WITH DIFFERENTIAL (CANCER CENTER ONLY)
Abs Immature Granulocytes: 0.02 10*3/uL (ref 0.00–0.07)
Basophils Absolute: 0 10*3/uL (ref 0.0–0.1)
Basophils Relative: 1 %
Eosinophils Absolute: 0.1 10*3/uL (ref 0.0–0.5)
Eosinophils Relative: 1 %
HCT: 38 % — ABNORMAL LOW (ref 39.0–52.0)
Hemoglobin: 12.4 g/dL — ABNORMAL LOW (ref 13.0–17.0)
Immature Granulocytes: 0 %
Lymphocytes Relative: 28 %
Lymphs Abs: 2.2 10*3/uL (ref 0.7–4.0)
MCH: 28.4 pg (ref 26.0–34.0)
MCHC: 32.6 g/dL (ref 30.0–36.0)
MCV: 87.2 fL (ref 80.0–100.0)
Monocytes Absolute: 0.9 10*3/uL (ref 0.1–1.0)
Monocytes Relative: 11 %
Neutro Abs: 4.7 10*3/uL (ref 1.7–7.7)
Neutrophils Relative %: 59 %
Platelet Count: 337 10*3/uL (ref 150–400)
RBC: 4.36 MIL/uL (ref 4.22–5.81)
RDW: 15.4 % (ref 11.5–15.5)
WBC Count: 7.9 10*3/uL (ref 4.0–10.5)
nRBC: 0 % (ref 0.0–0.2)

## 2021-03-20 LAB — CMP (CANCER CENTER ONLY)
ALT: 18 U/L (ref 0–44)
AST: 19 U/L (ref 15–41)
Albumin: 3.7 g/dL (ref 3.5–5.0)
Alkaline Phosphatase: 97 U/L (ref 38–126)
Anion gap: 14 (ref 5–15)
BUN: 8 mg/dL (ref 8–23)
CO2: 26 mmol/L (ref 22–32)
Calcium: 9.3 mg/dL (ref 8.9–10.3)
Chloride: 100 mmol/L (ref 98–111)
Creatinine: 0.7 mg/dL (ref 0.61–1.24)
GFR, Estimated: >60 mL/min (ref >60)
Glucose, Bld: 99 mg/dL (ref 70–99)
Potassium: 4 mmol/L (ref 3.5–5.1)
Sodium: 140 mmol/L (ref 135–145)
Total Bilirubin: 0.9 mg/dL (ref 0.3–1.2)
Total Protein: 7.5 g/dL (ref 6.5–8.1)

## 2021-03-20 LAB — IRON AND TIBC
Iron: 50 ug/dL (ref 42–163)
Saturation Ratios: 17 % — ABNORMAL LOW (ref 20–55)
TIBC: 286 ug/dL (ref 202–409)
UIBC: 236 ug/dL (ref 117–376)

## 2021-03-20 LAB — FERRITIN: Ferritin: 213 ng/mL (ref 24–336)

## 2021-03-20 LAB — CEA (IN HOUSE-CHCC): CEA (CHCC-In House): 2.86 ng/mL (ref 0.00–5.00)

## 2021-03-20 MED ORDER — OXYCODONE HCL 5 MG PO TABS: 5.0000 mg | ORAL_TABLET | Freq: Four times a day (QID) | ORAL | 0 refills | Status: DC | PRN

## 2021-03-20 MED ORDER — HEPARIN SOD (PORK) LOCK FLUSH 100 UNIT/ML IV SOLN
500.0000 [IU] | Freq: Once | INTRAVENOUS | Status: AC | PRN
  Administered 2021-03-20: 500 [IU]
  Filled 2021-03-20: qty 5

## 2021-03-20 MED ORDER — SODIUM CHLORIDE 0.9% FLUSH
10.0000 mL | INTRAVENOUS | Status: DC | PRN
  Administered 2021-03-20: 10 mL
  Filled 2021-03-20: qty 10

## 2021-03-21 ENCOUNTER — Encounter: Payer: Self-pay | Admitting: Hematology

## 2021-03-23 ENCOUNTER — Telehealth: Payer: Self-pay

## 2021-03-23 NOTE — Telephone Encounter (Signed)
I spoke with Nathaniel Norton and reviewed his CT appt and instructions.  He verbalized understanding.

## 2021-03-25 ENCOUNTER — Ambulatory Visit (HOSPITAL_COMMUNITY)
Admission: RE | Admit: 2021-03-25 | Discharge: 2021-03-25 | Disposition: A | Discharge status: Home / Self Care | Payer: Medicare Other | Source: Ambulatory Visit | Attending: Hematology | Admitting: Hematology

## 2021-03-25 ENCOUNTER — Other Ambulatory Visit: Payer: Self-pay

## 2021-03-25 DIAGNOSIS — Z933 Colostomy status: Secondary | ICD-10-CM | POA: Diagnosis not present

## 2021-03-25 DIAGNOSIS — J9811 Atelectasis: Secondary | ICD-10-CM | POA: Diagnosis not present

## 2021-03-25 DIAGNOSIS — C182 Malignant neoplasm of ascending colon: Secondary | ICD-10-CM | POA: Diagnosis not present

## 2021-03-25 DIAGNOSIS — C786 Secondary malignant neoplasm of retroperitoneum and peritoneum: Secondary | ICD-10-CM | POA: Diagnosis not present

## 2021-03-25 DIAGNOSIS — M4856XA Collapsed vertebra, not elsewhere classified, lumbar region, initial encounter for fracture: Secondary | ICD-10-CM | POA: Diagnosis not present

## 2021-03-25 DIAGNOSIS — C189 Malignant neoplasm of colon, unspecified: Secondary | ICD-10-CM | POA: Diagnosis not present

## 2021-03-25 DIAGNOSIS — K8689 Other specified diseases of pancreas: Secondary | ICD-10-CM | POA: Diagnosis not present

## 2021-03-25 DIAGNOSIS — M4854XA Collapsed vertebra, not elsewhere classified, thoracic region, initial encounter for fracture: Secondary | ICD-10-CM | POA: Diagnosis not present

## 2021-03-25 DIAGNOSIS — J90 Pleural effusion, not elsewhere classified: Secondary | ICD-10-CM | POA: Diagnosis not present

## 2021-03-25 DIAGNOSIS — Z9049 Acquired absence of other specified parts of digestive tract: Secondary | ICD-10-CM | POA: Diagnosis not present

## 2021-03-25 MED ORDER — IOHEXOL 300 MG/ML  SOLN
100.0000 mL | Freq: Once | INTRAMUSCULAR | Status: AC | PRN
  Administered 2021-03-25: 100 mL via INTRAVENOUS

## 2021-03-30 ENCOUNTER — Telehealth: Payer: Self-pay | Admitting: Hematology

## 2021-03-30 NOTE — Telephone Encounter (Signed)
Scheduled appts per 4/18 sch msg. Pt aware.  

## 2021-04-03 ENCOUNTER — Inpatient Hospital Stay: Payer: Medicare Other

## 2021-04-03 ENCOUNTER — Inpatient Hospital Stay (HOSPITAL_BASED_OUTPATIENT_CLINIC_OR_DEPARTMENT_OTHER): Payer: Medicare Other | Admitting: Hematology

## 2021-04-03 ENCOUNTER — Encounter: Payer: Self-pay | Admitting: Hematology

## 2021-04-03 ENCOUNTER — Other Ambulatory Visit: Payer: Self-pay

## 2021-04-03 VITALS — BP 120/80 | Temp 98.0°F | Resp 18 | Wt 125.2 lb

## 2021-04-03 DIAGNOSIS — Z95828 Presence of other vascular implants and grafts: Secondary | ICD-10-CM

## 2021-04-03 DIAGNOSIS — C182 Malignant neoplasm of ascending colon: Secondary | ICD-10-CM | POA: Diagnosis not present

## 2021-04-03 DIAGNOSIS — D500 Iron deficiency anemia secondary to blood loss (chronic): Secondary | ICD-10-CM | POA: Diagnosis not present

## 2021-04-03 DIAGNOSIS — R809 Proteinuria, unspecified: Secondary | ICD-10-CM

## 2021-04-03 DIAGNOSIS — N390 Urinary tract infection, site not specified: Secondary | ICD-10-CM | POA: Diagnosis not present

## 2021-04-03 DIAGNOSIS — Z9221 Personal history of antineoplastic chemotherapy: Secondary | ICD-10-CM | POA: Diagnosis not present

## 2021-04-03 DIAGNOSIS — C786 Secondary malignant neoplasm of retroperitoneum and peritoneum: Secondary | ICD-10-CM | POA: Diagnosis not present

## 2021-04-03 DIAGNOSIS — K651 Peritoneal abscess: Secondary | ICD-10-CM | POA: Diagnosis not present

## 2021-04-03 DIAGNOSIS — Z5111 Encounter for antineoplastic chemotherapy: Secondary | ICD-10-CM | POA: Diagnosis not present

## 2021-04-03 LAB — CMP (CANCER CENTER ONLY)
ALT: 20 U/L (ref 0–44)
AST: 23 U/L (ref 15–41)
Albumin: 3.8 g/dL (ref 3.5–5.0)
Alkaline Phosphatase: 84 U/L (ref 38–126)
Anion gap: 9 (ref 5–15)
BUN: 12 mg/dL (ref 8–23)
CO2: 29 mmol/L (ref 22–32)
Calcium: 9.6 mg/dL (ref 8.9–10.3)
Chloride: 101 mmol/L (ref 98–111)
Creatinine: 0.68 mg/dL (ref 0.61–1.24)
GFR, Estimated: >60 mL/min (ref >60)
Glucose, Bld: 106 mg/dL — ABNORMAL HIGH (ref 70–99)
Potassium: 3.9 mmol/L (ref 3.5–5.1)
Sodium: 139 mmol/L (ref 135–145)
Total Bilirubin: 1.4 mg/dL — ABNORMAL HIGH (ref 0.3–1.2)
Total Protein: 7.3 g/dL (ref 6.5–8.1)

## 2021-04-03 LAB — CBC WITH DIFFERENTIAL (CANCER CENTER ONLY)
Abs Immature Granulocytes: 0.02 10*3/uL (ref 0.00–0.07)
Basophils Absolute: 0 10*3/uL (ref 0.0–0.1)
Basophils Relative: 1 %
Eosinophils Absolute: 0.1 10*3/uL (ref 0.0–0.5)
Eosinophils Relative: 1 %
HCT: 38.5 % — ABNORMAL LOW (ref 39.0–52.0)
Hemoglobin: 12.6 g/dL — ABNORMAL LOW (ref 13.0–17.0)
Immature Granulocytes: 0 %
Lymphocytes Relative: 28 %
Lymphs Abs: 2.1 10*3/uL (ref 0.7–4.0)
MCH: 28.7 pg (ref 26.0–34.0)
MCHC: 32.7 g/dL (ref 30.0–36.0)
MCV: 87.7 fL (ref 80.0–100.0)
Monocytes Absolute: 0.8 10*3/uL (ref 0.1–1.0)
Monocytes Relative: 11 %
Neutro Abs: 4.3 10*3/uL (ref 1.7–7.7)
Neutrophils Relative %: 59 %
Platelet Count: 205 10*3/uL (ref 150–400)
RBC: 4.39 MIL/uL (ref 4.22–5.81)
RDW: 15.4 % (ref 11.5–15.5)
WBC Count: 7.4 10*3/uL (ref 4.0–10.5)
nRBC: 0 % (ref 0.0–0.2)

## 2021-04-03 MED ORDER — SODIUM CHLORIDE 0.9% FLUSH
10.0000 mL | INTRAVENOUS | Status: DC | PRN
  Administered 2021-04-03: 10 mL
  Filled 2021-04-03: qty 10

## 2021-04-03 MED ORDER — SODIUM CHLORIDE 0.9 % IV SOLN
10.0000 mg | Freq: Once | INTRAVENOUS | Status: AC
  Administered 2021-04-03: 10 mg via INTRAVENOUS
  Filled 2021-04-03: qty 10

## 2021-04-03 MED ORDER — ATROPINE SULFATE 1 MG/ML IJ SOLN
INTRAMUSCULAR | Status: AC
  Filled 2021-04-03: qty 1

## 2021-04-03 MED ORDER — DIPHENHYDRAMINE HCL 25 MG PO CAPS
ORAL_CAPSULE | ORAL | Status: AC
  Filled 2021-04-03: qty 2

## 2021-04-03 MED ORDER — SODIUM CHLORIDE 0.9 % IV SOLN
Freq: Once | INTRAVENOUS | Status: AC
Start: 2021-04-03 — End: 2021-04-03
  Filled 2021-04-03: qty 250

## 2021-04-03 MED ORDER — PALONOSETRON HCL INJECTION 0.25 MG/5ML
INTRAVENOUS | Status: AC
  Filled 2021-04-03: qty 5

## 2021-04-03 MED ORDER — ATROPINE SULFATE 1 MG/ML IJ SOLN
0.5000 mg | Freq: Once | INTRAMUSCULAR | Status: AC | PRN
  Administered 2021-04-03: 0.5 mg via INTRAVENOUS

## 2021-04-03 MED ORDER — DEXAMETHASONE 4 MG PO TABS
ORAL_TABLET | ORAL | Status: AC
  Filled 2021-04-03: qty 5

## 2021-04-03 MED ORDER — PALONOSETRON HCL INJECTION 0.25 MG/5ML
0.2500 mg | Freq: Once | INTRAVENOUS | Status: AC
  Administered 2021-04-03: 0.25 mg via INTRAVENOUS

## 2021-04-03 MED ORDER — ACETAMINOPHEN 325 MG PO TABS
ORAL_TABLET | ORAL | Status: AC
  Filled 2021-04-03: qty 2

## 2021-04-03 MED ORDER — SODIUM CHLORIDE 0.9 % IV SOLN
100.0000 mg/m2 | Freq: Once | INTRAVENOUS | Status: AC
  Administered 2021-04-03: 180 mg via INTRAVENOUS
  Filled 2021-04-03: qty 9

## 2021-04-03 NOTE — Patient Instructions (Addendum)
[facility] MEDICAL ONCOLOGY   Discharge Instructions: Thank you for choosing [facility] to provide your oncology and hematology care.   If you have a lab appointment with the Cancer Center, please go directly to the Cancer Center and check in at the registration area.   Wear comfortable clothing and clothing appropriate for easy access to any Portacath or PICC line.   We strive to give you quality time with your provider. You may need to reschedule your appointment if you arrive late (15 or more minutes).  Arriving late affects you and other patients whose appointments are after yours.  Also, if you miss three or more appointments without notifying the office, you may be dismissed from the clinic at the provider's discretion.      For prescription refill requests, have your pharmacy contact our office and allow 72 hours for refills to be completed.    Today you received the following chemotherapy and/or immunotherapy agents: irinotecan      To help prevent nausea and vomiting after your treatment, we encourage you to take your nausea medication as directed.  BELOW ARE SYMPTOMS THAT SHOULD BE REPORTED IMMEDIATELY: *FEVER GREATER THAN 100.4 F (38 C) OR HIGHER *CHILLS OR SWEATING *NAUSEA AND VOMITING THAT IS NOT CONTROLLED WITH YOUR NAUSEA MEDICATION *UNUSUAL SHORTNESS OF BREATH *UNUSUAL BRUISING OR BLEEDING *URINARY PROBLEMS (pain or burning when urinating, or frequent urination) *BOWEL PROBLEMS (unusual diarrhea, constipation, pain near the anus) TENDERNESS IN MOUTH AND THROAT WITH OR WITHOUT PRESENCE OF ULCERS (sore throat, sores in mouth, or a toothache) UNUSUAL RASH, SWELLING OR PAIN  UNUSUAL VAGINAL DISCHARGE OR ITCHING   Items with * indicate a potential emergency and should be followed up as soon as possible or go to the Emergency Department if any problems should occur.  Please show the CHEMOTHERAPY ALERT CARD or IMMUNOTHERAPY ALERT CARD at check-in  to the Emergency Department and triage nurse.  Should you have questions after your visit or need to cancel or reschedule your appointment, please contact [facility]  Dept: [phone]  and follow the prompts.  Office hours are 8:00 a.m. to 4:30 p.m. Monday - Friday. Please note that voicemails left after 4:00 p.m. may not be returned until the following business day.  We are closed weekends and major holidays. You have access to a nurse at all times for urgent questions. Please call the main number to the clinic Dept: [phone] and follow the prompts.   For any non-urgent questions, you may also contact your provider using MyChart. We now offer e-Visits for anyone 18 and older to request care online for non-urgent symptoms. For details visit [email or web address].   Also download the MyChart app! Go to the app store, search "MyChart", open the app, select [facility], and log in with your MyChart username and password.  Due to Covid, a mask is required upon entering the hospital/clinic. If you do not have a mask, one will be given to you upon arrival. For doctor visits, patients may have 1 support person aged 18 or older with them. For treatment visits, patients cannot have anyone with them due to current Covid guidelines and our immunocompromised population.   

## 2021-04-03 NOTE — Patient Instructions (Signed)
Implanted Port Insertion, Care After This sheet gives you information about how to care for yourself after your procedure. Your health care provider may also give you more specific instructions. If you have problems or questions, contact your health care provider. What can I expect after the procedure? After the procedure, it is common to have:  Discomfort at the port insertion site.  Bruising on the skin over the port. This should improve over 3-4 days. Follow these instructions at home: Port care  After your port is placed, you will get a manufacturer's information card. The card has information about your port. Keep this card with you at all times.  Take care of the port as told by your health care provider. Ask your health care provider if you or a family member can get training for taking care of the port at home. A home health care nurse may also take care of the port.  Make sure to remember what type of port you have. Incision care  Follow instructions from your health care provider about how to take care of your port insertion site. Make sure you: ? Wash your hands with soap and water before and after you change your bandage (dressing). If soap and water are not available, use hand sanitizer. ? Change your dressing as told by your health care provider. ? Leave stitches (sutures), skin glue, or adhesive strips in place. These skin closures may need to stay in place for 2 weeks or longer. If adhesive strip edges start to loosen and curl up, you may trim the loose edges. Do not remove adhesive strips completely unless your health care provider tells you to do that.  Check your port insertion site every day for signs of infection. Check for: ? Redness, swelling, or pain. ? Fluid or blood. ? Warmth. ? Pus or a bad smell.      Activity  Return to your normal activities as told by your health care provider. Ask your health care provider what activities are safe for you.  Do not  lift anything that is heavier than 10 lb (4.5 kg), or the limit that you are told, until your health care provider says that it is safe. General instructions  Take over-the-counter and prescription medicines only as told by your health care provider.  Do not take baths, swim, or use a hot tub until your health care provider approves. Ask your health care provider if you may take showers. You may only be allowed to take sponge baths.  Do not drive for 24 hours if you were given a sedative during your procedure.  Wear a medical alert bracelet in case of an emergency. This will tell any health care providers that you have a port.  Keep all follow-up visits as told by your health care provider. This is important. Contact a health care provider if:  You cannot flush your port with saline as directed, or you cannot draw blood from the port.  You have a fever or chills.  You have redness, swelling, or pain around your port insertion site.  You have fluid or blood coming from your port insertion site.  Your port insertion site feels warm to the touch.  You have pus or a bad smell coming from the port insertion site. Get help right away if:  You have chest pain or shortness of breath.  You have bleeding from your port that you cannot control. Summary  Take care of the port as told by your   health care provider. Keep the manufacturer's information card with you at all times.  Change your dressing as told by your health care provider.  Contact a health care provider if you have a fever or chills or if you have redness, swelling, or pain around your port insertion site.  Keep all follow-up visits as told by your health care provider. This information is not intended to replace advice given to you by your health care provider. Make sure you discuss any questions you have with your health care provider. Document Revised: 06/27/2018 Document Reviewed: 06/27/2018 Elsevier Patient Education   2021 Elsevier Inc.  

## 2021-04-03 NOTE — Progress Notes (Signed)
Nathaniel Norton   [phone] [phone]   Clinic Follow up Note   Patient Care Team: Lajean Manes, MD as PCP - General (Internal Medicine) Berle Mull, MD as Consulting Physician (Family Medicine) Clarene Essex, MD as Consulting Physician (Gastroenterology) Truitt Merle, MD as Consulting Physician (Oncology)  Date of Service:  04/03/2021  CHIEF COMPLAINT: F/u ofmetastaticcolon cancer  SUMMARY OF ONCOLOGIC HISTORY: Oncology History Overview Note  Cancer Staging Cancer of right colon [facility]) Staging form: Colon and Rectum, AJCC 8th Edition - Clinical stage from 04/09/2019: Stage IVC (cTX, cNX, pM1c) - Signed by Truitt Merle, MD on 05/03/2019     Cancer of right colon [facility])  04/09/2019 Procedure   Colonoscopy 04/09/19 by Dr Watt Climes IMPRESSION -internal hemorrhoids -Diverticulosis in the sigmoid colon  -2 small polyps in the rectum and in the proximal transverse colon, removed with a hot snare. Resected and retrieved.  -3 medium polyps in the proximal transverse colon, in the mid transverse colon and in the distal transverse colon, removed and resected and retrieved.  -likely malignant partially obstructing tumor at the hepatic flexure. biopsied, tattooed.  -1 large polyp in the mid ascending colon  -the examination was otherwise normal     04/09/2019 Initial Biopsy   FINAL MICROSCOPIC DIAGNOSIS: 04/09/19 1. LG intestine-hepatic flexure, Biopsy:   INVASIVE WELL DIFFERENTIATED ADENOCARCINOMA   04/09/2019 Cancer Staging   Staging form: Colon and Rectum, AJCC 8th Edition - Clinical stage from 04/09/2019: Stage IVC (cTX, cNX, pM1c) - Signed by Truitt Merle, MD on 05/03/2019   04/10/2019 Imaging   CT AP 04/10/19  IMPRESSION: 1. There is an eccentric mass of the colon involving the ascending colon near the hepatic flexure measuring approximately 3.4 x 3.4 by 2.0 cm (series 2, image 37, series 3, image 37). There is extensive soft tissue nodularity of the  mesocolon and omentum, and likely areas of the peritoneum, for example bilateral upper quadrants (series 2, image 25). Findings are consistent with primary colon malignancy, probable omental and peritoneal involvement, and small volume malignant ascites. 2.  Other chronic and incidental findings as detailed above.   04/20/2019 Initial Diagnosis   Cancer of right colon ([place])   04/26/2019 Imaging   CT Chest 04/26/19 IMPRESSION: 1. Borderline to mild lower thoracic adenopathy, including within the right internal mammary and juxta cardiophrenic stations. Given the appearance of the upper abdomen, suspicious for nodal metastasis. 2. A low right paratracheal node is borderline sized, but favored to be reactive. 3. No evidence of pulmonary metastasis. 4. Peritoneal metastasis and abdominal ascites, as before. 5. Pancreatic parenchymal calcifications indicative of chronic calcific pancreatitis. 6. Coronary artery atherosclerosis.   04/27/2019 Pathology Results   Diagnosis 04/27/19 Peritoneum, biopsy, right upper quadrant, perihepatic - ADENOCARCINOMA, CONSISTENT WITH COLONIC PRIMARY. - SEE COMMENT.   05/16/2019 - 10/31/2019 Chemotherapy   First line FOLFOX every 2 weeks with Avastin starting with cycle 2 starting 05/16/19. 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 Stopped after 10/31/19 for surgery.    07/23/2019 Imaging    CT AP W Contrast  IMPRESSION: 1. Primary ascending colon mass may be minimally smaller. Peritoneal metastatic disease appears slightly improved as well. 2. Chronic calcific pancreatitis. 3. Tiny left renal stone. 4. Small left inguinal hernia contains a knuckle of unobstructed colon. 5. Enlarged prostate.   10/29/2019 Imaging   CT AP W Contrast IMPRESSION: No significant  change in small ascending colon  soft tissue mass and diffuse omental carcinomatosis.   No new or progressive metastatic disease identified.   Stable small left inguinal hernia containing a loop of sigmoid colon. No evidence of bowel obstruction or strangulation.   Colonic diverticulosis. No radiographic evidence of diverticulitis.   Stable enlarged prostate.   12/17/2019 Pathology Results   HIPEC Surgery by Dr Clovis Riley 12/17/19 FINAL PATHOLOGIC DIAGNOSIS  MICROSCOPIC EXAMINATION AND DIAGNOSIS  A.          "RIGHT COLON, OMENTUM, SPLEEN":       Invasive adenocarcinoma with mucinous features, moderately differentiated.  Tumor involves mesentery and spleen.  Five out of ten lymph nodes involved by adenocarcinoma with perinodal soft tissue involvement (5/10), see comment.  Margins are uninvolved by invasive carcinoma.  Ileum and appendix, uninvolved.  See comment and cancer case summary.  B.          "ROUND LIGAMENT OF LIVER":       Involved by adenocarcinoma with mucinous features, moderately differentiated.  C.          "RIGHT DIAPHRAM STRIPPING":       Negative for carcinoma.  D.          "RIGHT HEPATIC CAPSULECTOMY":       Chronic inflammation and fibrosis.  Negative for carcinoma.  E.          "DEBRIDED TUMOR":       Involved by adenocarcinoma with mucinous features, moderately differentiated.   COMMENT: Sections disclose five out of ten lymph nodes involved by adenocarcinoma with perinodal soft tissue involvement. However, it is not possible to be certain if this represents lymph nodes replaced by tumor and/or soft tissue involvement. In addition, focal perineural invasion is seen.   04/03/2020 Imaging   CT AP W contrast  IMPRESSION: 1. Status post interval splenectomy and right hemicolectomy. 2. Response to therapy of omental/peritoneal metastasis. The only residual indeterminate finding is fluid density along the capsule of the hepatic dome which could be postoperative or secondary to localized  residual peritoneal disease. 3. Left nephrolithiasis. 4.  Possible constipation. 5. Prostatomegaly. 6.  Aortic Atherosclerosis (ICD10-I70.0).   07/11/2020 Imaging   CT AP  1.  Postsurgical changes of subtle reductive surgery including right hemicolectomy, omentectomy, splenectomy, and right hepatic capsulectomy.  2.  Interval development of multiple areas of low attenuation involving the liver, as described above. This the intraparenchymal low-density lesion is concerning for metastatic disease. Other disease along the capsule may be post therapy related. Residual thickening of the anterior peritoneum particularly anterior to the right lobe of the liver is also concerning for some residual disease although appears significantly improved. Recommend attention on follow-up.  3.  No definite evidence of residual peritoneal disease status post HIPEC.   09/18/2020 -  Chemotherapy   Second line chemo FOLFIRI and bevacizumab q2weeks starting 09/18/20. Chemo on hold since 12/23/20 due to fistula. RESTART with low dose irinotecan alone on 04/03/21.   12/19/2020 Imaging   CT CAP  IMPRESSION: 1. Interval development of a 1.6 x 2.7 x 1.6 cm collection of gas and debris between the sigmoid colon and dome of bladder, compatible with small abscess. A portion of this abscess is probably intramural at the dome of bladder. Marked associated thickening of the adjacent colonic and bladder walls with tiny gas bubbles in the thickened bladder wall and prominent amount of free gas in the bladder lumen suggests an associated colovesical fistula. Tiny gas bubbles are also seen in  the central prostate gland and may be in the urethra although parenchymal gas within the prostate gland is not excluded on this exam. 2. Nodularity along the major and minor fissures of the right hemithorax has decreased in the interval, nearly imperceptible today. 3. Peritoneal implants along the liver, right abdomen, and deep to the left  paramidline anterior abdominal wall have resolved in the interval by CT imaging. 4. Tiny right lower lobe pulmonary nodule is more conspicuous today. Attention on follow-up recommended. 5. Stable 4 mm hypodensity in the dome of the right liver, too small to characterize. 6. Nonobstructing left renal stones with bilateral renal cysts. 7. Stable compression deformity at multiple thoracic and lumbar levels. 8. Aortic Atherosclerosis (ICD10-I70.0).   02/24/2021 Surgery   LAPAROTOMY EXPLORATORY resection of sigmoid colon and proximal rectum, takedown of colovesical fistula, creation of end ileosotmy by Dr Clovis Riley    Final Pathologic Diagnosis      A.  SIGMOID COLON AND RECTUM, RESECTION:               Invasive adenocarcinoma with mucinous features, moderately differentiated.               Tumor measures 3.2 cm in greatest dimension. Tumor invades visceral peritoneum.  Lymphovascular invasion is present.               Metastatic adenocarcinoma involving seven (of 22) lymph nodes (7/22).  One margin involved by adenocarcinoma (grossly presumed distal margin).     03/25/2021 Imaging   IMPRESSION: 1. New bilateral pulmonary nodules worrisome for new pulmonary metastatic disease. 2. New small right pleural effusion with overlying atelectasis. 3. Recurrent pleural nodularity on the right. 4. No findings for abdominal/pelvic metastatic disease. 5. Stable severe atrophy of the pancreatic body and tail with associated main pancreatic duct dilatation. No obvious obstructing pancreatic lesion but this was not present on the prior CT scan from 04/03/2020. It may be due to a stricture or a small ductal lesion, not well seen. MRI abdomen without and with contrast may be helpful for further evaluation of this finding. 6. Stable surgical changes involving the colon with a Hartmann's pouch and left lower quadrant colostomy. 7. Stable enlarged prostate gland. 8. Stable thoracic and lumbar compression  fractures.      CURRENT THERAPY:  Second line chemo FOLFIRI andbevacizumabq2weeks starting 09/18/20. Chemo onholdsince 12/23/20 due to fistula.RESTART with low dose irinotecan alone on 04/03/21.  INTERVAL HISTORY:  Nathaniel Norton is here for a follow up. He notes clear discharge on like strawberry jam on tissue after surgery. Last episode yesterday. He notes indigestion from what he ate, improved after an episode of vomiting. He note she is still wants to know each dose amount of each treatment. He notes the pharmacy refilled his oxycodone on 03/20/21, which he picked up at that time. He notes he has been trying to eat more in the last few weeks.  He notes Dr Clovis Riley says he did not need his latest CT CAP. He notes he will be out of ostomy supplies soon. He did not get these from Dr Clovis Riley office at last visit, but will given information to call. However, he notes he needs prescription for his insurance to cover this. He notes he needs this set up.   REVIEW OF SYSTEMS:   Constitutional: Denies fevers, chills or abnormal weight loss Eyes: Denies blurriness of vision Ears, nose, mouth, throat, and face: Denies mucositis or sore throat Respiratory: Denies cough, dyspnea or wheezes Cardiovascular: Denies palpitation, chest  discomfort or lower extremity swelling Gastrointestinal:  Denies nausea, heartburn or change in bowel habits Skin: Denies abnormal skin rashes Lymphatics: Denies new lymphadenopathy or easy bruising Neurological:Denies numbness, tingling or new weaknesses Behavioral/Psych: Mood is stable, no new changes  All other systems were reviewed with the patient and are negative.  MEDICAL HISTORY:  Past Medical History:  Diagnosis Date  . colon ca dx'd 03/2019    SURGICAL HISTORY: Past Surgical History:  Procedure Laterality Date  . CATARACT EXTRACTION    . IR IMAGING GUIDED PORT INSERTION  05/10/2019  . L4 fracture  2012  . RETINAL DETACHMENT SURGERY    . Right hand surgery   1974    I have reviewed the social history and family history with the patient and they are unchanged from previous note.  ALLERGIES:  is allergic to feraheme [ferumoxytol] and codeine.  MEDICATIONS:  Current Outpatient Medications  Medication Sig Dispense Refill  . ciprofloxacin (CIPRO) 500 MG tablet Take 1 tablet (500 mg total) by mouth 2 (two) times daily. 30 tablet 1  . diphenoxylate-atropine (LOMOTIL) 2.5-0.025 MG tablet Take 1-2 tablets by mouth 4 (four) times daily as needed for diarrhea or loose stools. 90 tablet 1  . ferrous sulfate 325 (65 FE) MG tablet Take 325 mg by mouth daily with breakfast.    . fluticasone (FLONASE) 50 MCG/ACT nasal spray Place into the nose.    . lidocaine-prilocaine (EMLA) cream Apply 1 application topically as needed. 30 g 1  . metroNIDAZOLE (FLAGYL) 500 MG tablet Take 1 tablet (500 mg total) by mouth 3 (three) times daily. 30 tablet 1  . mirtazapine (REMERON) 7.5 MG tablet Take 1 tablet (7.5 mg total) by mouth at bedtime. 30 tablet 1  . Multiple Vitamin (MULTIVITAMIN) tablet Take 1 tablet by mouth daily.    . ondansetron (ZOFRAN) 8 MG tablet Take 1 tablet (8 mg total) by mouth every 8 (eight) hours as needed for nausea or vomiting. Start on day 3 after chemotherapy 20 tablet 0  . oxyCODONE (OXY IR/ROXICODONE) 5 MG immediate release tablet Take 1 tablet (5 mg total) by mouth every 6 (six) hours as needed for severe pain. 90 tablet 0  . potassium chloride SA (KLOR-CON) 20 MEQ tablet Take 1 tablet (20 mEq total) by mouth 2 (two) times daily. 40 tablet 1  . prochlorperazine (COMPAZINE) 10 MG tablet Take 1 tablet (10 mg total) by mouth every 6 (six) hours as needed (Nausea or vomiting). 30 tablet 2   No current facility-administered medications for this visit.    PHYSICAL EXAMINATION: ECOG PERFORMANCE STATUS: 1 - Symptomatic but completely ambulatory Blood pressure 120/80, respirate 18, temperature 36.7, pulse ox 100% on room air, weight 125  pounds GENERAL:alert, no distress and comfortable SKIN: skin color, texture, turgor are normal, no rashes or significant lesions (+) Mild swelling of left nipple and areola  EYES: normal, Conjunctiva are pink and non-injected, sclera clear  Musculoskeletal:no cyanosis of digits and no clubbing, no edema  NEURO: alert & oriented x 3 with fluent speech, no focal motor/sensory deficits  LABORATORY DATA:  I have reviewed the data as listed CBC Latest Ref Rng & Units 04/03/2021 03/20/2021 02/12/2021  WBC 4.0 - 10.5 K/uL 7.4 7.9 8.8  Hemoglobin 13.0 - 17.0 g/dL 12.6(L) 12.4(L) 12.8(L)  Hematocrit 39.0 - 52.0 % 38.5(L) 38.0(L) 39.0  Platelets 150 - 400 K/uL 205 337 345     CMP Latest Ref Rng & Units 04/03/2021 03/20/2021 02/12/2021  Glucose 70 - 99 mg/dL 106(H)  99 147(H)  BUN 8 - 23 mg/dL '12 8 8  ' Creatinine 0.61 - 1.24 mg/dL 0.68 0.70 0.80  Sodium 135 - 145 mmol/L 139 140 136  Potassium 3.5 - 5.1 mmol/L 3.9 4.0 4.0  Chloride 98 - 111 mmol/L 101 100 101  CO2 22 - 32 mmol/L '29 26 25  ' Calcium 8.9 - 10.3 mg/dL 9.6 9.3 9.3  Total Protein 6.5 - 8.1 g/dL 7.3 7.5 7.3  Total Bilirubin 0.3 - 1.2 mg/dL 1.4(H) 0.9 0.7  Alkaline Phos 38 - 126 U/L 84 97 79  AST 15 - 41 U/L '23 19 17  ' ALT 0 - 44 U/L '20 18 11      ' RADIOGRAPHIC STUDIES: I have personally reviewed the radiological images as listed and agreed with the findings in the report. No results found.   ASSESSMENT & PLAN:  Nathaniel Norton is a 70 y.o. male with   Left nipple tenderness, swelling, no discharge   1. Cancer of right colon,with peritonealand lungmetastasis,stage IV,MMR normal, KRAS mutation (+), MSS -He wasdiagnosed in 03/2019. His Colonoscopy biopsy showed adenocarcinoma of right hepatic flexurecolon.His peritoneal biopsy from 04/27/19 showsadenocarcinoma,consistent withmetastasis of his colon cancer. This is now stage IV disease. -He was treated with first-line FOLFOX in June-November 2020 before proceeding with  HIPECsurgeryby Dr. Dustin Folks 12/17/19. -Unfortunately he had disease progression on 09/10/20 PET withhypermetabolic pleural (right)and peritoneal nodules, which are consistent with metastatic disease.I do not think we need biopsy to confirm given his previously known peritoneal metastasis.  -Istarted him onsecond-line chemo withFOLFIRI and bevacizumab q2weekson 09/18/20. 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  -He was able to undergo diverting colostomy surgery by Dr Clovis Riley on 02/24/21. His urinary symptoms have improved. He is still interested in chemotherapy to treatment his cancer.  -His new baseline CT CAP from 03/25/21 showed New bilateral pulmonary small nodules worrisome for new pulmonary metastatic disease, New small right pleural effusion with overlying atelectasis and No findings for abdominal/pelvic metastatic disease. I personally reviewed the images and discussed with patient today.  -Labs reviewed, Hg 12.6, BG 106, Tbili 1.4. Overall adequate to proceed with restart of low dose irinotecan at 45% dose reduction today (same as before). I will keep him up to date of dosage each cycle per request. If tolerable may add back 5FU pump infusion.  -F/u in 2 weeks.   2. 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 This has caused frequent UTI -This is likely related to hisperitoneal metastasis, chemotherapy and bevacizumab. Chemo has been held since 12/23/20  -He proceeded with fistula takedown surgery with Dr Clovis Riley on 02/24/21. He has colostomy to by pass rectum. He is urinating adequately again. He notes "strawberry jam" discharge  recently.  -I discussed given his cancer is still present, there is a risk of another fistula occurring. We will monitor.  -For current abdominal pain he is taking Oxycodone 31m q6hours. He tries to reduce but not manageable. I last refilled (03/20/21).   -He notes he is about to run out of ostomy supplies. I encouraged him to set up supplies with Dr LClovis Rileynurse, ostomy clinic and his insurance.  -Weight is trending down still. I recommend he continue Ensure and increased protein/calorie intake. He tolerated  Ensure clear well.   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 Watt Climes showed internal hemorrhoids, benign polyps, diverticulosis and colon cancer.  -He was treated with oral iron,IV Feraheme on 05/03/19 and 05/16/19(had reaction with 2nd dose). -With cancer recurrence his anemia recurred.Overall mild  4. Elevated PSA  -PSA was 5.7 in 11/2018 and 4.6 in 01/2019. He has known enlarged prostate. 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 Possible surgery may include removal of bladder and prostate. He will consult with urologist soon.  5.Goal of care discussion  -The patient understands the goal of care is palliative. -I have recommended DNR, he will think about it  PLAN: -CT CAP reviewed today  -Labs reviewed and adequate to proceed with restart of low dose Irinotecan only today  -Lab, flush, f/u and irinotecan in 2 and 4 weeks, may add 5-fu pump from cycle 3    No problem-specific Assessment & Plan notes found for this encounter.   No orders of the defined types were placed in this encounter.  All questions were answered. The patient knows to call the clinic with any problems, questions or concerns. No barriers to learning was detected. The total time spent in the appointment was 30 minutes.     Truitt Merle, MD 04/03/2021    I, Joslyn Devon, am acting as scribe for Truitt Merle, MD.   I have reviewed the above documentation for accuracy and completeness, and I agree with the above.

## 2021-04-10 DIAGNOSIS — Z85038 Personal history of other malignant neoplasm of large intestine: Secondary | ICD-10-CM | POA: Diagnosis not present

## 2021-04-10 DIAGNOSIS — Z933 Colostomy status: Secondary | ICD-10-CM | POA: Diagnosis not present

## 2021-04-16 ENCOUNTER — Inpatient Hospital Stay (HOSPITAL_BASED_OUTPATIENT_CLINIC_OR_DEPARTMENT_OTHER): Payer: Medicare Other | Admitting: Hematology

## 2021-04-16 ENCOUNTER — Other Ambulatory Visit: Payer: Self-pay | Admitting: *Deleted

## 2021-04-16 ENCOUNTER — Inpatient Hospital Stay: Payer: Medicare Other | Attending: Hematology

## 2021-04-16 ENCOUNTER — Inpatient Hospital Stay: Payer: Medicare Other

## 2021-04-16 ENCOUNTER — Other Ambulatory Visit: Payer: Self-pay

## 2021-04-16 ENCOUNTER — Encounter: Payer: Self-pay | Admitting: Hematology

## 2021-04-16 VITALS — BP 113/71 | HR 104 | Temp 98.3°F | Resp 18 | Ht 66.0 in | Wt 120.5 lb

## 2021-04-16 DIAGNOSIS — N390 Urinary tract infection, site not specified: Secondary | ICD-10-CM | POA: Insufficient documentation

## 2021-04-16 DIAGNOSIS — R972 Elevated prostate specific antigen [PSA]: Secondary | ICD-10-CM | POA: Insufficient documentation

## 2021-04-16 DIAGNOSIS — D500 Iron deficiency anemia secondary to blood loss (chronic): Secondary | ICD-10-CM | POA: Diagnosis not present

## 2021-04-16 DIAGNOSIS — R1110 Vomiting, unspecified: Secondary | ICD-10-CM | POA: Diagnosis not present

## 2021-04-16 DIAGNOSIS — Z933 Colostomy status: Secondary | ICD-10-CM | POA: Insufficient documentation

## 2021-04-16 DIAGNOSIS — Z5189 Encounter for other specified aftercare: Secondary | ICD-10-CM | POA: Diagnosis not present

## 2021-04-16 DIAGNOSIS — C786 Secondary malignant neoplasm of retroperitoneum and peritoneum: Secondary | ICD-10-CM | POA: Insufficient documentation

## 2021-04-16 DIAGNOSIS — C182 Malignant neoplasm of ascending colon: Secondary | ICD-10-CM | POA: Diagnosis not present

## 2021-04-16 DIAGNOSIS — R634 Abnormal weight loss: Secondary | ICD-10-CM | POA: Insufficient documentation

## 2021-04-16 DIAGNOSIS — K651 Peritoneal abscess: Secondary | ICD-10-CM | POA: Diagnosis not present

## 2021-04-16 DIAGNOSIS — R809 Proteinuria, unspecified: Secondary | ICD-10-CM

## 2021-04-16 DIAGNOSIS — Z9221 Personal history of antineoplastic chemotherapy: Secondary | ICD-10-CM | POA: Insufficient documentation

## 2021-04-16 DIAGNOSIS — N200 Calculus of kidney: Secondary | ICD-10-CM | POA: Diagnosis not present

## 2021-04-16 DIAGNOSIS — Z5111 Encounter for antineoplastic chemotherapy: Secondary | ICD-10-CM | POA: Diagnosis not present

## 2021-04-16 DIAGNOSIS — N400 Enlarged prostate without lower urinary tract symptoms: Secondary | ICD-10-CM | POA: Insufficient documentation

## 2021-04-16 DIAGNOSIS — Z95828 Presence of other vascular implants and grafts: Secondary | ICD-10-CM

## 2021-04-16 LAB — CMP (CANCER CENTER ONLY)
ALT: 14 U/L (ref 0–44)
AST: 15 U/L (ref 15–41)
Albumin: 3.5 g/dL (ref 3.5–5.0)
Alkaline Phosphatase: 80 U/L (ref 38–126)
Anion gap: 11 (ref 5–15)
BUN: 10 mg/dL (ref 8–23)
CO2: 26 mmol/L (ref 22–32)
Calcium: 9.4 mg/dL (ref 8.9–10.3)
Chloride: 98 mmol/L (ref 98–111)
Creatinine: 0.75 mg/dL (ref 0.61–1.24)
GFR, Estimated: >60 mL/min (ref >60)
Glucose, Bld: 129 mg/dL — ABNORMAL HIGH (ref 70–99)
Potassium: 3.8 mmol/L (ref 3.5–5.1)
Sodium: 135 mmol/L (ref 135–145)
Total Bilirubin: 1 mg/dL (ref 0.3–1.2)
Total Protein: 7 g/dL (ref 6.5–8.1)

## 2021-04-16 LAB — CBC WITH DIFFERENTIAL (CANCER CENTER ONLY)
Abs Immature Granulocytes: 0.01 10*3/uL (ref 0.00–0.07)
Basophils Absolute: 0 10*3/uL (ref 0.0–0.1)
Basophils Relative: 1 %
Eosinophils Absolute: 0.1 10*3/uL (ref 0.0–0.5)
Eosinophils Relative: 2 %
HCT: 36.2 % — ABNORMAL LOW (ref 39.0–52.0)
Hemoglobin: 12 g/dL — ABNORMAL LOW (ref 13.0–17.0)
Immature Granulocytes: 0 %
Lymphocytes Relative: 48 %
Lymphs Abs: 1.7 10*3/uL (ref 0.7–4.0)
MCH: 28.4 pg (ref 26.0–34.0)
MCHC: 33.1 g/dL (ref 30.0–36.0)
MCV: 85.6 fL (ref 80.0–100.0)
Monocytes Absolute: 0.7 10*3/uL (ref 0.1–1.0)
Monocytes Relative: 19 %
Neutro Abs: 1.1 10*3/uL — ABNORMAL LOW (ref 1.7–7.7)
Neutrophils Relative %: 30 %
Platelet Count: 271 10*3/uL (ref 150–400)
RBC: 4.23 MIL/uL (ref 4.22–5.81)
RDW: 14.6 % (ref 11.5–15.5)
WBC Count: 3.5 10*3/uL — ABNORMAL LOW (ref 4.0–10.5)
nRBC: 0 % (ref 0.0–0.2)

## 2021-04-16 LAB — TOTAL PROTEIN, URINE DIPSTICK: Protein, ur: NEGATIVE mg/dL

## 2021-04-16 MED ORDER — SODIUM CHLORIDE 0.9 % IV SOLN
160.0000 mg | Freq: Once | INTRAVENOUS | Status: AC
  Administered 2021-04-16: 160 mg via INTRAVENOUS
  Filled 2021-04-16: qty 8

## 2021-04-16 MED ORDER — SODIUM CHLORIDE 0.9 % IV SOLN
10.0000 mg | Freq: Once | INTRAVENOUS | Status: AC
  Administered 2021-04-16: 10 mg via INTRAVENOUS
  Filled 2021-04-16: qty 10

## 2021-04-16 MED ORDER — SODIUM CHLORIDE 0.9 % IV SOLN
Freq: Once | INTRAVENOUS | Status: AC
Start: 2021-04-16 — End: 2021-04-16
  Filled 2021-04-16: qty 250

## 2021-04-16 MED ORDER — HEPARIN SOD (PORK) LOCK FLUSH 100 UNIT/ML IV SOLN
500.0000 [IU] | Freq: Once | INTRAVENOUS | Status: AC | PRN
  Administered 2021-04-16: 500 [IU]
  Filled 2021-04-16: qty 5

## 2021-04-16 MED ORDER — SODIUM CHLORIDE 0.9% FLUSH
10.0000 mL | INTRAVENOUS | Status: DC | PRN
  Administered 2021-04-16: 10 mL
  Filled 2021-04-16: qty 10

## 2021-04-16 MED ORDER — PALONOSETRON HCL INJECTION 0.25 MG/5ML
0.2500 mg | Freq: Once | INTRAVENOUS | Status: AC
  Administered 2021-04-16: 0.25 mg via INTRAVENOUS

## 2021-04-16 MED ORDER — OXYCODONE HCL 5 MG PO TABS: 5.0000 mg | ORAL_TABLET | Freq: Four times a day (QID) | ORAL | 0 refills | Status: DC | PRN

## 2021-04-16 MED ORDER — ATROPINE SULFATE 1 MG/ML IJ SOLN
0.5000 mg | Freq: Once | INTRAMUSCULAR | Status: AC | PRN
  Administered 2021-04-16: 0.5 mg via INTRAVENOUS

## 2021-04-16 MED ORDER — PALONOSETRON HCL INJECTION 0.25 MG/5ML
INTRAVENOUS | Status: AC
  Filled 2021-04-16: qty 5

## 2021-04-16 MED ORDER — ATROPINE SULFATE 1 MG/ML IJ SOLN
INTRAMUSCULAR | Status: AC
  Filled 2021-04-16: qty 1

## 2021-04-16 NOTE — Patient Instructions (Signed)
[facility]ICAL [facility]COLOG[facility]  Discharge Instructions: ?Thank you for choosing [facility] to provide your oncology and hematology care.  ? ?If you have a lab appointment with the Cancer Center, please go directly to the Cancer Center and check in at the registration area. ?  ?Wear comfortable clothing and clothing appropriate for easy access to any Portacath or PICC line.  ? ?We strive to give you quality time with your provider. You may need to reschedule your appointment if you arrive late (15 or more minutes).  Arriving late affects you and other patients whose appointments are after yours.  Also, if you miss three or more appointments without notifying the office, you may be dismissed from the clinic at the provider?s discretion.    ?  ?For prescription refill requests, have your pharmacy contact our office and allow 72 hours for refills to be completed.   ? ?Today you received the following chemotherapy and/or immunotherapy agents: Irinotecan    ?  ?To help prevent nausea and vomiting after your treatment, we encourage you to take your nausea medication as directed. ? ?BELOW ARE SYMPTOMS THAT SHOULD BE REPORTED IMMEDIATELY: ?*FEVER GREATER THAN 100.4 F (38 ?C) OR HIGHER ?*CHILLS OR SWEATING ?*NAUSEA AND VOMITING THAT IS NOT CONTROLLED WITH YOUR NAUSEA MEDICATION ?*UNUSUAL SHORTNESS OF BREATH ?*UNUSUAL BRUISING OR BLEEDING ?*URINARY PROBLEMS (pain or burning when urinating, or frequent urination) ?*BOWEL PROBLEMS (unusual diarrhea, constipation, pain near the anus) ?TENDERNESS IN MOUTH AND THROAT WITH OR WITHOUT PRESENCE OF ULCERS (sore throat, sores in mouth, or a toothache) ?UNUSUAL RASH, SWELLING OR PAIN  ?UNUSUAL VAGINAL DISCHARGE OR ITCHING  ? ?Items with * indicate a potential emergency and should be followed up as soon as possible or go to the Emergency Department if any problems should occur. ? ?Please show the CHEMOTHERAPY ALERT CARD or IMMUNOTHERAPY ALERT CARD at check-in to  the Emergency Department and triage nurse. ? ?Should you have questions after your visit or need to cancel or reschedule your appointment, please contact [facility]  Dept: [phone]  and follow the prompts.  Office hours are 8:00 a.m. to 4:30 p.m. Monday - Friday. Please note that voicemails left after 4:00 p.m. may not be returned until the following business day.  We are closed weekends and major holidays. You have access to a nurse at all times for urgent questions. Please call the main number to the clinic Dept: [phone] and follow the prompts. ? ? ?For any non-urgent questions, you may also contact your provider using MyChart. We now offer e-Visits for anyone 18 and older to request care online for non-urgent symptoms. For details visit [email or web address]. ?  ?Also download the MyChart app! Go to the app store, search "MyChart", open the app, select [facility], and log in with your MyChart username and password. ? ?Due to Covid, a mask is required upon entering the hospital/clinic. If you do not have a mask, one will be given to you upon arrival. For doctor visits, patients may have 1 support person aged 18 or older with them. For treatment visits, patients cannot have anyone with them due to current Covid guidelines and our immunocompromised population.  ? ?

## 2021-04-16 NOTE — Progress Notes (Signed)
Per MD okay to treat with ANC 1.1 and HR 104.

## 2021-04-16 NOTE — Progress Notes (Signed)
Nathaniel Norton   [phone] [phone]   Clinic Follow up Note   Patient Care Team: Lajean Manes, MD as PCP - General (Internal Medicine) Berle Mull, MD as Consulting Physician (Family Medicine) Clarene Essex, MD as Consulting Physician (Gastroenterology) Truitt Merle, MD as Consulting Physician (Oncology)  Date of Service:  04/16/2021  CHIEF COMPLAINT: f/u of metastaticcolon cancer  SUMMARY OF ONCOLOGIC HISTORY: Oncology History Overview Note  Cancer Staging Cancer of right colon [facility]) Staging form: Colon and Rectum, AJCC 8th Edition - Clinical stage from 04/09/2019: Stage IVC (cTX, cNX, pM1c) - Signed by Truitt Merle, MD on 05/03/2019     Cancer of right colon [facility])  04/09/2019 Procedure   Colonoscopy 04/09/19 by Dr Watt Climes IMPRESSION -internal hemorrhoids -Diverticulosis in the sigmoid colon  -2 small polyps in the rectum and in the proximal transverse colon, removed with a hot snare. Resected and retrieved.  -3 medium polyps in the proximal transverse colon, in the mid transverse colon and in the distal transverse colon, removed and resected and retrieved.  -likely malignant partially obstructing tumor at the hepatic flexure. biopsied, tattooed.  -1 large polyp in the mid ascending colon  -the examination was otherwise normal     04/09/2019 Initial Biopsy   FINAL MICROSCOPIC DIAGNOSIS: 04/09/19 1. LG intestine-hepatic flexure, Biopsy:   INVASIVE WELL DIFFERENTIATED ADENOCARCINOMA   04/09/2019 Cancer Staging   Staging form: Colon and Rectum, AJCC 8th Edition - Clinical stage from 04/09/2019: Stage IVC (cTX, cNX, pM1c) - Signed by Truitt Merle, MD on 05/03/2019   04/10/2019 Imaging   CT AP 04/10/19  IMPRESSION: 1. There is an eccentric mass of the colon involving the ascending colon near the hepatic flexure measuring approximately 3.4 x 3.4 by 2.0 cm (series 2, image 37, series 3, image 37). There is extensive soft tissue nodularity of the  mesocolon and omentum, and likely areas of the peritoneum, for example bilateral upper quadrants (series 2, image 25). Findings are consistent with primary colon malignancy, probable omental and peritoneal involvement, and small volume malignant ascites. 2.  Other chronic and incidental findings as detailed above.   04/20/2019 Initial Diagnosis   Cancer of right colon ([place])   04/26/2019 Imaging   CT Chest 04/26/19 IMPRESSION: 1. Borderline to mild lower thoracic adenopathy, including within the right internal mammary and juxta cardiophrenic stations. Given the appearance of the upper abdomen, suspicious for nodal metastasis. 2. A low right paratracheal node is borderline sized, but favored to be reactive. 3. No evidence of pulmonary metastasis. 4. Peritoneal metastasis and abdominal ascites, as before. 5. Pancreatic parenchymal calcifications indicative of chronic calcific pancreatitis. 6. Coronary artery atherosclerosis.   04/27/2019 Pathology Results   Diagnosis 04/27/19 Peritoneum, biopsy, right upper quadrant, perihepatic - ADENOCARCINOMA, CONSISTENT WITH COLONIC PRIMARY. - SEE COMMENT.   05/16/2019 - 10/31/2019 Chemotherapy   First line FOLFOX every 2 weeks with Avastin starting with cycle 2 starting 05/16/19. 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 Stopped after 10/31/19 for surgery.    07/23/2019 Imaging    CT AP W Contrast  IMPRESSION: 1. Primary ascending colon mass may be minimally smaller. Peritoneal metastatic disease appears slightly improved as well. 2. Chronic calcific pancreatitis. 3. Tiny left renal stone. 4. Small left inguinal hernia contains a knuckle of unobstructed colon. 5. Enlarged prostate.   10/29/2019 Imaging   CT AP W Contrast IMPRESSION: No  significant change in small ascending colon  soft tissue mass and diffuse omental carcinomatosis.   No new or progressive metastatic disease identified.   Stable small left inguinal hernia containing a loop of sigmoid colon. No evidence of bowel obstruction or strangulation.   Colonic diverticulosis. No radiographic evidence of diverticulitis.   Stable enlarged prostate.   12/17/2019 Pathology Results   HIPEC Surgery by Dr Clovis Riley 12/17/19 FINAL PATHOLOGIC DIAGNOSIS  MICROSCOPIC EXAMINATION AND DIAGNOSIS  A.          "RIGHT COLON, OMENTUM, SPLEEN":       Invasive adenocarcinoma with mucinous features, moderately differentiated.  Tumor involves mesentery and spleen.  Five out of ten lymph nodes involved by adenocarcinoma with perinodal soft tissue involvement (5/10), see comment.  Margins are uninvolved by invasive carcinoma.  Ileum and appendix, uninvolved.  See comment and cancer case summary.  B.          "ROUND LIGAMENT OF LIVER":       Involved by adenocarcinoma with mucinous features, moderately differentiated.  C.          "RIGHT DIAPHRAM STRIPPING":       Negative for carcinoma.  D.          "RIGHT HEPATIC CAPSULECTOMY":       Chronic inflammation and fibrosis.  Negative for carcinoma.  E.          "DEBRIDED TUMOR":       Involved by adenocarcinoma with mucinous features, moderately differentiated.   COMMENT: Sections disclose five out of ten lymph nodes involved by adenocarcinoma with perinodal soft tissue involvement. However, it is not possible to be certain if this represents lymph nodes replaced by tumor and/or soft tissue involvement. In addition, focal perineural invasion is seen.   04/03/2020 Imaging   CT AP W contrast  IMPRESSION: 1. Status post interval splenectomy and right hemicolectomy. 2. Response to therapy of omental/peritoneal metastasis. The only residual indeterminate finding is fluid density along the capsule of the hepatic dome which could be postoperative or secondary to localized  residual peritoneal disease. 3. Left nephrolithiasis. 4.  Possible constipation. 5. Prostatomegaly. 6.  Aortic Atherosclerosis (ICD10-I70.0).   07/11/2020 Imaging   CT AP  1.  Postsurgical changes of subtle reductive surgery including right hemicolectomy, omentectomy, splenectomy, and right hepatic capsulectomy.  2.  Interval development of multiple areas of low attenuation involving the liver, as described above. This the intraparenchymal low-density lesion is concerning for metastatic disease. Other disease along the capsule may be post therapy related. Residual thickening of the anterior peritoneum particularly anterior to the right lobe of the liver is also concerning for some residual disease although appears significantly improved. Recommend attention on follow-up.  3.  No definite evidence of residual peritoneal disease status post HIPEC.   09/18/2020 -  Chemotherapy   Second line chemo FOLFIRI and bevacizumab q2weeks starting 09/18/20. Chemo on hold since 12/23/20 due to fistula. RESTART with low dose irinotecan alone on 04/03/21.   12/19/2020 Imaging   CT CAP  IMPRESSION: 1. Interval development of a 1.6 x 2.7 x 1.6 cm collection of gas and debris between the sigmoid colon and dome of bladder, compatible with small abscess. A portion of this abscess is probably intramural at the dome of bladder. Marked associated thickening of the adjacent colonic and bladder walls with tiny gas bubbles in the thickened bladder wall and prominent amount of free gas in the bladder lumen suggests an associated colovesical fistula. Tiny gas bubbles are also seen  in the central prostate gland and may be in the urethra although parenchymal gas within the prostate gland is not excluded on this exam. 2. Nodularity along the major and minor fissures of the right hemithorax has decreased in the interval, nearly imperceptible today. 3. Peritoneal implants along the liver, right abdomen, and deep to the left  paramidline anterior abdominal wall have resolved in the interval by CT imaging. 4. Tiny right lower lobe pulmonary nodule is more conspicuous today. Attention on follow-up recommended. 5. Stable 4 mm hypodensity in the dome of the right liver, too small to characterize. 6. Nonobstructing left renal stones with bilateral renal cysts. 7. Stable compression deformity at multiple thoracic and lumbar levels. 8. Aortic Atherosclerosis (ICD10-I70.0).   02/24/2021 Surgery   LAPAROTOMY EXPLORATORY resection of sigmoid colon and proximal rectum, takedown of colovesical fistula, creation of end ileosotmy by Dr Clovis Riley    Final Pathologic Diagnosis      A.  SIGMOID COLON AND RECTUM, RESECTION:               Invasive adenocarcinoma with mucinous features, moderately differentiated.               Tumor measures 3.2 cm in greatest dimension. Tumor invades visceral peritoneum.  Lymphovascular invasion is present.               Metastatic adenocarcinoma involving seven (of 22) lymph nodes (7/22).  One margin involved by adenocarcinoma (grossly presumed distal margin).     03/25/2021 Imaging   IMPRESSION: 1. New bilateral pulmonary nodules worrisome for new pulmonary metastatic disease. 2. New small right pleural effusion with overlying atelectasis. 3. Recurrent pleural nodularity on the right. 4. No findings for abdominal/pelvic metastatic disease. 5. Stable severe atrophy of the pancreatic body and tail with associated main pancreatic duct dilatation. No obvious obstructing pancreatic lesion but this was not present on the prior CT scan from 04/03/2020. It may be due to a stricture or a small ductal lesion, not well seen. MRI abdomen without and with contrast may be helpful for further evaluation of this finding. 6. Stable surgical changes involving the colon with a Hartmann's pouch and left lower quadrant colostomy. 7. Stable enlarged prostate gland. 8. Stable thoracic and lumbar compression  fractures.      CURRENT THERAPY:  Second line chemo FOLFIRI andbevacizumabq2weeks starting 09/18/20. Chemo onholdsince 12/23/20 due to fistula.RESTART with low dose irinotecan alone on 04/03/21.  INTERVAL HISTORY:  Nathaniel Norton is here for a follow up of metastaticcolon cancer. He was last seen by me on 04/03/21. He presents to the clinic alone. He described his side effects and daily issues in great detail. In summary, he experienced abdominal cramping and gas, some constipation for which he tried duphalac, vomiting of liquids, and decreased appetite. He went to [facility] since his last visit for ostomy supplies. He was able to get a set of supplies from them on 04/09/21.   All other systems were reviewed with the patient and are negative.  MEDICAL HISTORY:  Past Medical History:  Diagnosis Date  . colon ca dx'd 03/2019    SURGICAL HISTORY: Past Surgical History:  Procedure Laterality Date  . CATARACT EXTRACTION    . IR IMAGING GUIDED PORT INSERTION  05/10/2019  . L4 fracture  2012  . RETINAL DETACHMENT SURGERY    . Right hand surgery  1974    I have reviewed the social history and family history with the patient and they are unchanged from previous note.  ALLERGIES:  is allergic to feraheme [ferumoxytol] and codeine.  MEDICATIONS:  Current Outpatient Medications  Medication Sig Dispense Refill  . ciprofloxacin (CIPRO) 500 MG tablet Take 1 tablet (500 mg total) by mouth 2 (two) times daily. 30 tablet 1  . diphenoxylate-atropine (LOMOTIL) 2.5-0.025 MG tablet Take 1-2 tablets by mouth 4 (four) times daily as needed for diarrhea or loose stools. 90 tablet 1  . ferrous sulfate 325 (65 FE) MG tablet Take 325 mg by mouth daily with breakfast.    . fluticasone (FLONASE) 50 MCG/ACT nasal spray Place into the nose.    . lidocaine-prilocaine (EMLA) cream Apply 1 application topically as needed. 30 g 1  . metroNIDAZOLE (FLAGYL) 500 MG tablet Take 1 tablet (500 mg total) by mouth 3  (three) times daily. 30 tablet 1  . mirtazapine (REMERON) 7.5 MG tablet Take 1 tablet (7.5 mg total) by mouth at bedtime. 30 tablet 1  . Multiple Vitamin (MULTIVITAMIN) tablet Take 1 tablet by mouth daily.    . ondansetron (ZOFRAN) 8 MG tablet Take 1 tablet (8 mg total) by mouth every 8 (eight) hours as needed for nausea or vomiting. Start on day 3 after chemotherapy 20 tablet 0  . oxyCODONE (OXY IR/ROXICODONE) 5 MG immediate release tablet Take 1 tablet (5 mg total) by mouth every 6 (six) hours as needed for severe pain. 90 tablet 0  . potassium chloride SA (KLOR-CON) 20 MEQ tablet Take 1 tablet (20 mEq total) by mouth 2 (two) times daily. 40 tablet 1  . prochlorperazine (COMPAZINE) 10 MG tablet Take 1 tablet (10 mg total) by mouth every 6 (six) hours as needed (Nausea or vomiting). 30 tablet 2   No current facility-administered medications for this visit.   Facility-Administered Medications Ordered in Other Visits  Medication Dose Route Frequency Provider Last Rate Last Admin  . heparin lock flush 100 unit/mL  500 Units Intracatheter Once PRN Truitt Merle, MD      . irinotecan (CAMPTOSAR) 160 mg in sodium chloride 0.9 % 500 mL chemo infusion  160 mg Intravenous Once Truitt Merle, MD 339 mL/hr at 04/16/21 1501 160 mg at 04/16/21 1501  . sodium chloride flush (NS) 0.9 % injection 10 mL  10 mL Intracatheter PRN Truitt Merle, MD        PHYSICAL EXAMINATION: ECOG PERFORMANCE STATUS: 2 - Symptomatic, <50% confined to bed  Vitals:   04/16/21 1255  BP: 113/71  Pulse: (!) 104  Resp: 18  Temp: 98.3 F (36.8 C)  SpO2: 99%   Filed Weights   04/16/21 1255  Weight: 120 lb 8 oz (54.7 kg)    GENERAL:alert, no distress and comfortable SKIN: skin color, texture, turgor are normal, no rashes or significant lesions EYES: normal, Conjunctiva are pink and non-injected, sclera clear  LUNGS: clear to auscultation and percussion with normal breathing effort HEART: regular rate & rhythm and no murmurs and no  lower extremity edema ABDOMEN:abdomen soft, non-tender and normal bowel sounds Musculoskeletal:no cyanosis of digits and no clubbing, (+) clostomy bag contains small amount liquid stool  NEURO: alert & oriented x 3 with fluent speech, no focal motor/sensory deficits  LABORATORY DATA:  I have reviewed the data as listed CBC Latest Ref Rng & Units 04/16/2021 04/03/2021 03/20/2021  WBC 4.0 - 10.5 K/uL 3.5(L) 7.4 7.9  Hemoglobin 13.0 - 17.0 g/dL 12.0(L) 12.6(L) 12.4(L)  Hematocrit 39.0 - 52.0 % 36.2(L) 38.5(L) 38.0(L)  Platelets 150 - 400 K/uL 271 205 337     CMP Latest Ref Rng & Units  04/16/2021 04/03/2021 03/20/2021  Glucose 70 - 99 mg/dL 129(H) 106(H) 99  BUN 8 - 23 mg/dL '10 12 8  ' Creatinine 0.61 - 1.24 mg/dL 0.75 0.68 0.70  Sodium 135 - 145 mmol/L 135 139 140  Potassium 3.5 - 5.1 mmol/L 3.8 3.9 4.0  Chloride 98 - 111 mmol/L 98 101 100  CO2 22 - 32 mmol/L '26 29 26  ' Calcium 8.9 - 10.3 mg/dL 9.4 9.6 9.3  Total Protein 6.5 - 8.1 g/dL 7.0 7.3 7.5  Total Bilirubin 0.3 - 1.2 mg/dL 1.0 1.4(H) 0.9  Alkaline Phos 38 - 126 U/L 80 84 97  AST 15 - 41 U/L '15 23 19  ' ALT 0 - 44 U/L '14 20 18      ' RADIOGRAPHIC STUDIES: I have personally reviewed the radiological images as listed and agreed with the findings in the report. No results found.   ASSESSMENT & PLAN:  Jabori Henegar is a 70 y.o. male with   1. Cancer of right colon,with peritonealand lungmetastasis,stage IV,MMR normal, KRAS mutation (+), MSS -He wasdiagnosed in 03/2019. His Colonoscopy biopsy showed adenocarcinoma of right hepatic flexurecolon.His peritoneal biopsy from 04/27/19 showsadenocarcinoma,consistent withmetastasis of his colon cancer. This is now stage IV disease. -He was treated with first-line FOLFOX in June-November 2020 before proceeding with HIPECsurgeryby Dr. Dustin Folks 12/17/19. -Unfortunately he had disease progression on 09/10/20 PET withhypermetabolic pleural (right)and peritoneal nodules, which are  consistent with metastatic disease.I do not think we need biopsy to confirm given his previously known peritoneal metastasis. -Istarted him onsecond-line chemo withFOLFIRI and bevacizumab q2weekson 09/18/20. 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 -He was able to undergodiverting colostomy surgeryby Dr Clovis Riley on 02/24/21. His urinary symptoms have improved. He is still interested in chemotherapy to treatment his cancer.  -His new baseline CT CAP from 03/25/21 showed New bilateral pulmonary small nodules worrisome for new pulmonary metastatic disease, New small right pleural effusion with overlying atelectasis and No findings for abdominal/pelvic metastatic disease. -He was restarted on low dose irinotecan alone on 04/03/21. -Labs reviewed. Overall adequate to proceed with low dose irinotecan at 45% dose reduction today (same as before). I will keep him up to date of dosage each cycle per request. We will discuss adding 5-fu pump to C3 before that dose. -Due to his mild neutropenia, will add pegfilgrastim tomorrow -F/u in 2 weeks.   2. Weight Loss -He has continued to lose weight recently. His appetite has decreased as well. -He experienced vomiting, abdominal cramping, and increased gas following his last treatment. -I will refer him to our dietician.  3. 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 This has caused frequent UTI -This is likely related to hisperitoneal metastasis, chemotherapy and bevacizumab. Chemo has been held since 12/23/20  -He proceeded with fistula takedown surgery with Dr Clovis Riley on 02/24/21. He has colostomy  to by pass rectum.  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 Watt Climes showed internal hemorrhoids, benign polyps, diverticulosis and colon cancer.  -He was treated with oral iron,IV Feraheme on 05/03/19 and 05/16/19(had reaction with 2nd dose). -With cancer recurrence his anemia recurred.Overall mild  4. Elevated PSA  -PSA was 5.7 in 11/2018 and 4.6 in  01/2019. He has known enlarged prostate. 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 Possible surgery may include removal of bladder and prostate. He will consult with urologist soon.  5.Goal of care discussion  -The patient understands the goal of care is palliative. -I have recommended DNR, he will think about it   PLAN: -I refilled his oxycodone today -Labs reviewed and adequate to proceed with same low dose Irinotecan today and add pegfilgrastim tomorrow  -Lab, flush, f/u and irinotecan in 2 weeks, may add 5-fu pumpfrom cycle 3 or 4 if he tolerates this cycle well    No problem-specific Assessment & Plan notes found for this encounter.   No orders of the defined types were placed in this encounter.  All questions were answered. The patient knows to call the clinic with any problems, questions or concerns. No barriers to learning was detected. The total time spent in the appointment was 30 minutes.     Truitt Merle, MD 04/16/2021   I, Wilburn Mylar, am acting as scribe for Truitt Merle, MD.   I have reviewed the above documentation for accuracy and completeness, and I agree with the above.

## 2021-04-17 ENCOUNTER — Ambulatory Visit: Payer: Medicare Other | Admitting: Hematology

## 2021-04-17 ENCOUNTER — Other Ambulatory Visit: Payer: Self-pay

## 2021-04-17 ENCOUNTER — Ambulatory Visit: Payer: Medicare Other

## 2021-04-17 ENCOUNTER — Telehealth: Payer: Self-pay

## 2021-04-17 ENCOUNTER — Other Ambulatory Visit: Payer: Medicare Other

## 2021-04-17 ENCOUNTER — Inpatient Hospital Stay: Payer: Medicare Other

## 2021-04-17 DIAGNOSIS — Z5189 Encounter for other specified aftercare: Secondary | ICD-10-CM | POA: Diagnosis not present

## 2021-04-17 DIAGNOSIS — N390 Urinary tract infection, site not specified: Secondary | ICD-10-CM | POA: Diagnosis not present

## 2021-04-17 DIAGNOSIS — R634 Abnormal weight loss: Secondary | ICD-10-CM | POA: Diagnosis not present

## 2021-04-17 DIAGNOSIS — Z9221 Personal history of antineoplastic chemotherapy: Secondary | ICD-10-CM | POA: Diagnosis not present

## 2021-04-17 DIAGNOSIS — R1110 Vomiting, unspecified: Secondary | ICD-10-CM | POA: Diagnosis not present

## 2021-04-17 DIAGNOSIS — Z5111 Encounter for antineoplastic chemotherapy: Secondary | ICD-10-CM | POA: Diagnosis not present

## 2021-04-17 DIAGNOSIS — D500 Iron deficiency anemia secondary to blood loss (chronic): Secondary | ICD-10-CM | POA: Diagnosis not present

## 2021-04-17 DIAGNOSIS — C182 Malignant neoplasm of ascending colon: Secondary | ICD-10-CM

## 2021-04-17 DIAGNOSIS — Z933 Colostomy status: Secondary | ICD-10-CM | POA: Diagnosis not present

## 2021-04-17 DIAGNOSIS — K651 Peritoneal abscess: Secondary | ICD-10-CM | POA: Diagnosis not present

## 2021-04-17 DIAGNOSIS — C786 Secondary malignant neoplasm of retroperitoneum and peritoneum: Secondary | ICD-10-CM | POA: Diagnosis not present

## 2021-04-17 DIAGNOSIS — N200 Calculus of kidney: Secondary | ICD-10-CM | POA: Diagnosis not present

## 2021-04-17 MED ORDER — PEGFILGRASTIM-BMEZ 6 MG/0.6ML ~~LOC~~ SOSY
PREFILLED_SYRINGE | SUBCUTANEOUS | Status: AC
  Filled 2021-04-17: qty 0.6

## 2021-04-17 MED ORDER — PEGFILGRASTIM-BMEZ 6 MG/0.6ML ~~LOC~~ SOSY
6.0000 mg | PREFILLED_SYRINGE | Freq: Once | SUBCUTANEOUS | Status: AC
Start: 2021-04-17 — End: 2021-04-17
  Administered 2021-04-17: 6 mg via SUBCUTANEOUS

## 2021-04-17 NOTE — Telephone Encounter (Addendum)
Nutrition Follow-up:  Patient with metastatic colon cancer.  Patient s/p diverting colostomy 02/24/21.  Has started low dose irinotecan alone on 04/03/21.    Last seen by Nutrition on 10/2020  Message received from Dr Burr Medico to contact patient regarding weight loss and poor appetite.   Called and spoke with patient via phone.  Patient reports poor appetite. Reports vomiting this am and some previously with first round of chemotherapy.  Has had issues with constipation via colostomy.  Has met with other RD and has all information.  Does not cook and mainly prepares foods via microwave (TV dinners, chicken pot pies).  Likes ensure clear shakes.  Does better with room temperature foods.    Medications: reviewed  Labs: reviewed  Anthropometrics:   Weight 120 lb 8 oz on 5/5  139 on 2/22 134 lb 9 oz on 10/29/20  13% weight loss in the last 2 1/2 months, significant  Estimated Energy Needs  Kcals: 1600-1900 Protein: 80-95 g of protein Fluid: 1.6 L  NUTRITION DIAGNOSIS: Inadequate oral intake related to cancer and cancer related treatment side effects as evidenced by 13% weight loss in the last 2 1/2 months and poor appetite   INTERVENTION:  RD discussed options with patient for trying to increase calories and protein in diet. Many options discussed patient did not like or did not feel would work in his lifestyle or budget.  He is planning to try and look at labels and pick highest calorie frozen meals that he likes.  He is going to add more protein to pizza. He is going to try to add more peanut butter in diet. Does not like bread or crackers anymore. RD suggested graham crackers or vanilla wafers and peanut butter. He will think about it.    Encouraged patient to take nausea medications proactively. Patient wanting to be more active and build muscle. RD stressed that good nutrition helps with this goal.   Patient declined follow-up with nutrition but will call RD if has further questions  or concerns.  Contact information provided to patient.    NEXT VISIT: patient declined follow-up.  RD available as needed  Bryna Razavi B. Zenia Resides, [place], [place] Registered Dietitian [phone] (mobile)

## 2021-04-17 NOTE — Patient Instructions (Signed)
[facility]ICAL [facility]COLOG[facility]  Discharge Instructions: Thank you for choosing [facility] to provide your oncology and hematology care.   If you have a lab appointment with the Cancer Center, please go directly to the Cancer Center and check in at the registration area.   Wear comfortable clothing and clothing appropriate for easy access to any Portacath or PICC line.   We strive to give you quality time with your provider. You may need to reschedule your appointment if you arrive late (15 or more minutes).  Arriving late affects you and other patients whose appointments are after yours.  Also, if you miss three or more appointments without notifying the office, you may be dismissed from the clinic at the provider's discretion.      For prescription refill requests, have your pharmacy contact our office and allow 72 hours for refills to be completed.    Today you received the following chemotherapy and/or immunotherapy agents   To help prevent nausea and vomiting after your treatment, we encourage you to take your nausea medication as directed.  BELOW ARE SYMPTOMS THAT SHOULD BE REPORTED IMMEDIATELY: *FEVER GREATER THAN 100.4 F (38 C) OR HIGHER *CHILLS OR SWEATING *NAUSEA AND VOMITING THAT IS NOT CONTROLLED WITH YOUR NAUSEA MEDICATION *UNUSUAL SHORTNESS OF BREATH *UNUSUAL BRUISING OR BLEEDING *URINARY PROBLEMS (pain or burning when urinating, or frequent urination) *BOWEL PROBLEMS (unusual diarrhea, constipation, pain near the anus) TENDERNESS IN MOUTH AND THROAT WITH OR WITHOUT PRESENCE OF ULCERS (sore throat, sores in mouth, or a toothache) UNUSUAL RASH, SWELLING OR PAIN  UNUSUAL VAGINAL DISCHARGE OR ITCHING   Items with * indicate a potential emergency and should be followed up as soon as possible or go to the Emergency Department if any problems should occur.  Please show the CHEMOTHERAPY ALERT CARD or IMMUNOTHERAPY ALERT CARD at check-in to the Emergency  Department and triage nurse.  Should you have questions after your visit or need to cancel or reschedule your appointment, please contact [facility]  Dept: [phone]  and follow the prompts.  Office hours are 8:00 a.m. to 4:30 p.m. Monday - Friday. Please note that voicemails left after 4:00 p.m. may not be returned until the following business day.  We are closed weekends and major holidays. You have access to a nurse at all times for urgent questions. Please call the main number to the clinic Dept: [phone] and follow the prompts.   For any non-urgent questions, you may also contact your provider using MyChart. We now offer e-Visits for anyone 18 and older to request care online for non-urgent symptoms. For details visit [email or web address].   Also download the MyChart app! Go to the app store, search "MyChart", open the app, select [facility], and log in with your MyChart username and password.  Due to Covid, a mask is required upon entering the hospital/clinic. If you do not have a mask, one will be given to you upon arrival. For doctor visits, patients may have 1 support person aged 18 or older with them. For treatment visits, patients cannot have anyone with them due to current Covid guidelines and our immunocompromised population.   

## 2021-04-21 ENCOUNTER — Telehealth: Payer: Self-pay | Admitting: Hematology

## 2021-04-21 DIAGNOSIS — Z85038 Personal history of other malignant neoplasm of large intestine: Secondary | ICD-10-CM | POA: Diagnosis not present

## 2021-04-21 DIAGNOSIS — Z933 Colostomy status: Secondary | ICD-10-CM | POA: Diagnosis not present

## 2021-04-21 NOTE — Telephone Encounter (Signed)
Scheduled follow-up appointments per 5/5 los. Patient is aware. 

## 2021-04-30 ENCOUNTER — Other Ambulatory Visit: Payer: Self-pay

## 2021-04-30 ENCOUNTER — Inpatient Hospital Stay: Payer: Medicare Other

## 2021-04-30 ENCOUNTER — Encounter: Payer: Self-pay | Admitting: Hematology

## 2021-04-30 ENCOUNTER — Inpatient Hospital Stay (HOSPITAL_BASED_OUTPATIENT_CLINIC_OR_DEPARTMENT_OTHER): Payer: Medicare Other | Admitting: Hematology

## 2021-04-30 VITALS — BP 113/68 | HR 75 | Temp 97.8°F | Resp 16 | Ht 66.0 in | Wt 124.4 lb

## 2021-04-30 DIAGNOSIS — Z5111 Encounter for antineoplastic chemotherapy: Secondary | ICD-10-CM | POA: Diagnosis not present

## 2021-04-30 DIAGNOSIS — C182 Malignant neoplasm of ascending colon: Secondary | ICD-10-CM

## 2021-04-30 DIAGNOSIS — K651 Peritoneal abscess: Secondary | ICD-10-CM | POA: Diagnosis not present

## 2021-04-30 DIAGNOSIS — D500 Iron deficiency anemia secondary to blood loss (chronic): Secondary | ICD-10-CM

## 2021-04-30 DIAGNOSIS — N390 Urinary tract infection, site not specified: Secondary | ICD-10-CM | POA: Diagnosis not present

## 2021-04-30 DIAGNOSIS — Z95828 Presence of other vascular implants and grafts: Secondary | ICD-10-CM

## 2021-04-30 DIAGNOSIS — Z933 Colostomy status: Secondary | ICD-10-CM | POA: Diagnosis not present

## 2021-04-30 DIAGNOSIS — Z5189 Encounter for other specified aftercare: Secondary | ICD-10-CM | POA: Diagnosis not present

## 2021-04-30 DIAGNOSIS — Z9221 Personal history of antineoplastic chemotherapy: Secondary | ICD-10-CM | POA: Diagnosis not present

## 2021-04-30 DIAGNOSIS — R1110 Vomiting, unspecified: Secondary | ICD-10-CM | POA: Diagnosis not present

## 2021-04-30 DIAGNOSIS — C786 Secondary malignant neoplasm of retroperitoneum and peritoneum: Secondary | ICD-10-CM | POA: Diagnosis not present

## 2021-04-30 DIAGNOSIS — R634 Abnormal weight loss: Secondary | ICD-10-CM | POA: Diagnosis not present

## 2021-04-30 DIAGNOSIS — N200 Calculus of kidney: Secondary | ICD-10-CM | POA: Diagnosis not present

## 2021-04-30 LAB — FERRITIN: Ferritin: 255 ng/mL (ref 24–336)

## 2021-04-30 LAB — CBC WITH DIFFERENTIAL (CANCER CENTER ONLY)
Abs Immature Granulocytes: 0.26 10*3/uL — ABNORMAL HIGH (ref 0.00–0.07)
Basophils Absolute: 0.1 10*3/uL (ref 0.0–0.1)
Basophils Relative: 0 %
Eosinophils Absolute: 0.1 10*3/uL (ref 0.0–0.5)
Eosinophils Relative: 0 %
HCT: 34.4 % — ABNORMAL LOW (ref 39.0–52.0)
Hemoglobin: 11.3 g/dL — ABNORMAL LOW (ref 13.0–17.0)
Immature Granulocytes: 1 %
Lymphocytes Relative: 18 %
Lymphs Abs: 3.7 10*3/uL (ref 0.7–4.0)
MCH: 27.9 pg (ref 26.0–34.0)
MCHC: 32.8 g/dL (ref 30.0–36.0)
MCV: 84.9 fL (ref 80.0–100.0)
Monocytes Absolute: 1.4 10*3/uL — ABNORMAL HIGH (ref 0.1–1.0)
Monocytes Relative: 6 %
Neutro Abs: 15.9 10*3/uL — ABNORMAL HIGH (ref 1.7–7.7)
Neutrophils Relative %: 75 %
Platelet Count: 400 10*3/uL (ref 150–400)
RBC: 4.05 MIL/uL — ABNORMAL LOW (ref 4.22–5.81)
RDW: 16.3 % — ABNORMAL HIGH (ref 11.5–15.5)
WBC Count: 21.4 10*3/uL — ABNORMAL HIGH (ref 4.0–10.5)
nRBC: 0 % (ref 0.0–0.2)

## 2021-04-30 LAB — CMP (CANCER CENTER ONLY)
ALT: 21 U/L (ref 0–44)
AST: 17 U/L (ref 15–41)
Albumin: 3.1 g/dL — ABNORMAL LOW (ref 3.5–5.0)
Alkaline Phosphatase: 110 U/L (ref 38–126)
Anion gap: 9 (ref 5–15)
BUN: 8 mg/dL (ref 8–23)
CO2: 30 mmol/L (ref 22–32)
Calcium: 9 mg/dL (ref 8.9–10.3)
Chloride: 102 mmol/L (ref 98–111)
Creatinine: 0.68 mg/dL (ref 0.61–1.24)
GFR, Estimated: >60 mL/min (ref >60)
Glucose, Bld: 132 mg/dL — ABNORMAL HIGH (ref 70–99)
Potassium: 3.9 mmol/L (ref 3.5–5.1)
Sodium: 141 mmol/L (ref 135–145)
Total Bilirubin: 0.5 mg/dL (ref 0.3–1.2)
Total Protein: 6.3 g/dL — ABNORMAL LOW (ref 6.5–8.1)

## 2021-04-30 LAB — CEA (IN HOUSE-CHCC): CEA (CHCC-In House): 3.22 ng/mL (ref 0.00–5.00)

## 2021-04-30 MED ORDER — ATROPINE SULFATE 1 MG/ML IJ SOLN
0.5000 mg | Freq: Once | INTRAMUSCULAR | Status: AC | PRN
  Administered 2021-04-30: 0.5 mg via INTRAVENOUS

## 2021-04-30 MED ORDER — SODIUM CHLORIDE 0.9 % IV SOLN
Freq: Once | INTRAVENOUS | Status: AC
  Filled 2021-04-30: qty 250

## 2021-04-30 MED ORDER — ATROPINE SULFATE 1 MG/ML IJ SOLN
INTRAMUSCULAR | Status: AC
  Filled 2021-04-30: qty 1

## 2021-04-30 MED ORDER — HEPARIN SOD (PORK) LOCK FLUSH 100 UNIT/ML IV SOLN
500.0000 [IU] | Freq: Once | INTRAVENOUS | Status: AC | PRN
  Administered 2021-04-30: 500 [IU]
  Filled 2021-04-30: qty 5

## 2021-04-30 MED ORDER — SODIUM CHLORIDE 0.9% FLUSH
10.0000 mL | INTRAVENOUS | Status: DC | PRN
  Administered 2021-04-30: 10 mL
  Filled 2021-04-30: qty 10

## 2021-04-30 MED ORDER — PALONOSETRON HCL INJECTION 0.25 MG/5ML
0.2500 mg | Freq: Once | INTRAVENOUS | Status: AC
  Administered 2021-04-30: 0.25 mg via INTRAVENOUS

## 2021-04-30 MED ORDER — SODIUM CHLORIDE 0.9 % IV SOLN
160.0000 mg | Freq: Once | INTRAVENOUS | Status: AC
  Administered 2021-04-30: 160 mg via INTRAVENOUS
  Filled 2021-04-30: qty 8

## 2021-04-30 MED ORDER — PALONOSETRON HCL INJECTION 0.25 MG/5ML
INTRAVENOUS | Status: AC
  Filled 2021-04-30: qty 5

## 2021-04-30 MED ORDER — SODIUM CHLORIDE 0.9 % IV SOLN
10.0000 mg | Freq: Once | INTRAVENOUS | Status: AC
  Administered 2021-04-30: 10 mg via INTRAVENOUS
  Filled 2021-04-30: qty 10

## 2021-04-30 NOTE — Progress Notes (Signed)
[name]   Telephone:([phone]) [phone] Fax:([phone]   Clinic Follow up Note   Patient Care Team: Lajean Manes, MD as PCP - General (Internal Medicine) Berle Mull, MD as Consulting Physician (Family Medicine) Clarene Essex, MD as Consulting Physician (Gastroenterology) Truitt Merle, MD as Consulting Physician (Oncology)  Date of Service:  04/30/2021  CHIEF COMPLAINT: f/u of metastatic colon cancer  SUMMARY OF ONCOLOGIC HISTORY: Oncology History Overview Note  Cancer Staging Cancer of right colon [facility]) Staging form: Colon and Rectum, AJCC 8th Edition - Clinical stage from 04/09/2019: Stage IVC (cTX, cNX, pM1c) - Signed by Truitt Merle, MD on 05/03/2019     Cancer of right colon [facility])  04/09/2019 Procedure   Colonoscopy 04/09/19 by Dr Watt Climes IMPRESSION -internal hemorrhoids -Diverticulosis in the sigmoid colon  -2 small polyps in the rectum and in the proximal transverse colon, removed with a hot snare. Resected and retrieved.  -3 medium polyps in the proximal transverse colon, in the mid transverse colon and in the distal transverse colon, removed and resected and retrieved.  -likely malignant partially obstructing tumor at the hepatic flexure. biopsied, tattooed.  -1 large polyp in the mid ascending colon  -the examination was otherwise normal     04/09/2019 Initial Biopsy   FINAL MICROSCOPIC DIAGNOSIS: 04/09/19 1. LG intestine-hepatic flexure, Biopsy:   INVASIVE WELL DIFFERENTIATED ADENOCARCINOMA   04/09/2019 Cancer Staging   Staging form: Colon and Rectum, AJCC 8th Edition - Clinical stage from 04/09/2019: Stage IVC (cTX, cNX, pM1c) - Signed by Truitt Merle, MD on 05/03/2019   04/10/2019 Imaging   CT AP 04/10/19  IMPRESSION: 1. There is an eccentric mass of the colon involving the ascending colon near the hepatic flexure measuring approximately 3.4 x 3.4 by 2.0 cm (series 2, image 37, series 3, image 37). There is extensive soft tissue nodularity of the  mesocolon and omentum, and likely areas of the peritoneum, for example bilateral upper quadrants (series 2, image 25). Findings are consistent with primary colon malignancy, probable omental and peritoneal involvement, and small volume malignant ascites. 2.  Other chronic and incidental findings as detailed above.   04/20/2019 Initial Diagnosis   Cancer of right colon ([place])   04/26/2019 Imaging   CT Chest 04/26/19 IMPRESSION: 1. Borderline to mild lower thoracic adenopathy, including within the right internal mammary and juxta cardiophrenic stations. Given the appearance of the upper abdomen, suspicious for nodal metastasis. 2. A low right paratracheal node is borderline sized, but favored to be reactive. 3. No evidence of pulmonary metastasis. 4. Peritoneal metastasis and abdominal ascites, as before. 5. Pancreatic parenchymal calcifications indicative of chronic calcific pancreatitis. 6. Coronary artery atherosclerosis.   04/27/2019 Pathology Results   Diagnosis 04/27/19 Peritoneum, biopsy, right upper quadrant, perihepatic - ADENOCARCINOMA, CONSISTENT WITH COLONIC PRIMARY. - SEE COMMENT.   05/16/2019 - 10/31/2019 Chemotherapy   First line FOLFOX every 2 weeks with Avastin starting with cycle 2 starting 05/16/19. 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 Stopped after 10/31/19 for surgery.    07/23/2019 Imaging    CT AP W Contrast  IMPRESSION: 1. Primary ascending colon mass may be minimally smaller. Peritoneal metastatic disease appears slightly improved as well. 2. Chronic calcific pancreatitis. 3. Tiny left renal stone. 4. Small left inguinal hernia contains a knuckle of unobstructed colon. 5. Enlarged prostate.   10/29/2019 Imaging   CT AP W Contrast IMPRESSION:  No significant change in small ascending colon  soft tissue mass and diffuse omental carcinomatosis.   No new or progressive metastatic disease identified.   Stable small left inguinal hernia containing a loop of sigmoid colon. No evidence of bowel obstruction or strangulation.   Colonic diverticulosis. No radiographic evidence of diverticulitis.   Stable enlarged prostate.   12/17/2019 Pathology Results   HIPEC Surgery by Dr Clovis Riley 12/17/19 FINAL PATHOLOGIC DIAGNOSIS  MICROSCOPIC EXAMINATION AND DIAGNOSIS  A.          "RIGHT COLON, OMENTUM, SPLEEN":       Invasive adenocarcinoma with mucinous features, moderately differentiated.  Tumor involves mesentery and spleen.  Five out of ten lymph nodes involved by adenocarcinoma with perinodal soft tissue involvement (5/10), see comment.  Margins are uninvolved by invasive carcinoma.  Ileum and appendix, uninvolved.  See comment and cancer case summary.  B.          "ROUND LIGAMENT OF LIVER":       Involved by adenocarcinoma with mucinous features, moderately differentiated.  C.          "RIGHT DIAPHRAM STRIPPING":       Negative for carcinoma.  D.          "RIGHT HEPATIC CAPSULECTOMY":       Chronic inflammation and fibrosis.  Negative for carcinoma.  E.          "DEBRIDED TUMOR":       Involved by adenocarcinoma with mucinous features, moderately differentiated.   COMMENT: Sections disclose five out of ten lymph nodes involved by adenocarcinoma with perinodal soft tissue involvement. However, it is not possible to be certain if this represents lymph nodes replaced by tumor and/or soft tissue involvement. In addition, focal perineural invasion is seen.   04/03/2020 Imaging   CT AP W contrast  IMPRESSION: 1. Status post interval splenectomy and right hemicolectomy. 2. Response to therapy of omental/peritoneal metastasis. The only residual indeterminate finding is fluid density along the capsule of the hepatic dome which could be postoperative or secondary to localized  residual peritoneal disease. 3. Left nephrolithiasis. 4.  Possible constipation. 5. Prostatomegaly. 6.  Aortic Atherosclerosis (ICD10-I70.0).   07/11/2020 Imaging   CT AP  1.  Postsurgical changes of subtle reductive surgery including right hemicolectomy, omentectomy, splenectomy, and right hepatic capsulectomy.  2.  Interval development of multiple areas of low attenuation involving the liver, as described above. This the intraparenchymal low-density lesion is concerning for metastatic disease. Other disease along the capsule may be post therapy related. Residual thickening of the anterior peritoneum particularly anterior to the right lobe of the liver is also concerning for some residual disease although appears significantly improved. Recommend attention on follow-up.  3.  No definite evidence of residual peritoneal disease status post HIPEC.   09/18/2020 -  Chemotherapy   Second line chemo FOLFIRI and bevacizumab q2weeks starting 09/18/20. Chemo on hold since 12/23/20 due to fistula. RESTART with low dose irinotecan alone on 04/03/21.   12/19/2020 Imaging   CT CAP  IMPRESSION: 1. Interval development of a 1.6 x 2.7 x 1.6 cm collection of gas and debris between the sigmoid colon and dome of bladder, compatible with small abscess. A portion of this abscess is probably intramural at the dome of bladder. Marked associated thickening of the adjacent colonic and bladder walls with tiny gas bubbles in the thickened bladder wall and prominent amount of free gas in the bladder lumen suggests an associated colovesical fistula. Tiny gas bubbles are also  seen in the central prostate gland and may be in the urethra although parenchymal gas within the prostate gland is not excluded on this exam. 2. Nodularity along the major and minor fissures of the right hemithorax has decreased in the interval, nearly imperceptible today. 3. Peritoneal implants along the liver, right abdomen, and deep to the left  paramidline anterior abdominal wall have resolved in the interval by CT imaging. 4. Tiny right lower lobe pulmonary nodule is more conspicuous today. Attention on follow-up recommended. 5. Stable 4 mm hypodensity in the dome of the right liver, too small to characterize. 6. Nonobstructing left renal stones with bilateral renal cysts. 7. Stable compression deformity at multiple thoracic and lumbar levels. 8. Aortic Atherosclerosis (ICD10-I70.0).   02/24/2021 Surgery   LAPAROTOMY EXPLORATORY resection of sigmoid colon and proximal rectum, takedown of colovesical fistula, creation of end ileosotmy by Dr Clovis Riley    Final Pathologic Diagnosis      A.  SIGMOID COLON AND RECTUM, RESECTION:               Invasive adenocarcinoma with mucinous features, moderately differentiated.               Tumor measures 3.2 cm in greatest dimension. Tumor invades visceral peritoneum.  Lymphovascular invasion is present.               Metastatic adenocarcinoma involving seven (of 22) lymph nodes (7/22).  One margin involved by adenocarcinoma (grossly presumed distal margin).     03/25/2021 Imaging   IMPRESSION: 1. New bilateral pulmonary nodules worrisome for new pulmonary metastatic disease. 2. New small right pleural effusion with overlying atelectasis. 3. Recurrent pleural nodularity on the right. 4. No findings for abdominal/pelvic metastatic disease. 5. Stable severe atrophy of the pancreatic body and tail with associated main pancreatic duct dilatation. No obvious obstructing pancreatic lesion but this was not present on the prior CT scan from 04/03/2020. It may be due to a stricture or a small ductal lesion, not well seen. MRI abdomen without and with contrast may be helpful for further evaluation of this finding. 6. Stable surgical changes involving the colon with a Hartmann's pouch and left lower quadrant colostomy. 7. Stable enlarged prostate gland. 8. Stable thoracic and lumbar compression  fractures.      CURRENT THERAPY:  Second line chemo FOLFIRI andbevacizumabq2weeks starting 09/18/20. Chemo onholdsince 12/23/20 due to fistula.RESTARTwith low dose irinotecan alone on 04/03/21.  INTERVAL HISTORY:  Nathaniel Norton is here for a follow up of metastatic colon cancer. He was last seen by me on 04/16/21. He presents to the clinic alone.  He notes he changed his diet and is eating more calories. He reports "this has been the best 2 weeks in the last 6 months." He reports liquid stool. His output fluctuates but is overall slower for the first few days following chemo. The first week, he reports a solitary instance of vomiting. This is improved from his prior cycle. He denies abdominal pain but notes some discomfort if he doesn't take the oxycodone. He notes stable neuropathy.  All other systems were reviewed with the patient and are negative.  MEDICAL HISTORY:  Past Medical History:  Diagnosis Date  . colon ca dx'd 03/2019    SURGICAL HISTORY: Past Surgical History:  Procedure Laterality Date  . CATARACT EXTRACTION    . IR IMAGING GUIDED PORT INSERTION  05/10/2019  . L4 fracture  2012  . RETINAL DETACHMENT SURGERY    . Right hand surgery  1974  I have reviewed the social history and family history with the patient and they are unchanged from previous note.  ALLERGIES:  is allergic to feraheme [ferumoxytol] and codeine.  MEDICATIONS:  Current Outpatient Medications  Medication Sig Dispense Refill  . diphenoxylate-atropine (LOMOTIL) 2.5-0.025 MG tablet Take 1-2 tablets by mouth 4 (four) times daily as needed for diarrhea or loose stools. 90 tablet 1  . fluticasone (FLONASE) 50 MCG/ACT nasal spray Place into the nose.    . lidocaine-prilocaine (EMLA) cream Apply 1 application topically as needed. 30 g 1  . Multiple Vitamin (MULTIVITAMIN) tablet Take 1 tablet by mouth daily.    . ondansetron (ZOFRAN) 8 MG tablet Take 1 tablet (8 mg total) by mouth every 8 (eight)  hours as needed for nausea or vomiting. Start on day 3 after chemotherapy 20 tablet 0  . oxyCODONE (OXY IR/ROXICODONE) 5 MG immediate release tablet Take 1 tablet (5 mg total) by mouth every 6 (six) hours as needed for severe pain. 90 tablet 0  . prochlorperazine (COMPAZINE) 10 MG tablet Take 1 tablet (10 mg total) by mouth every 6 (six) hours as needed (Nausea or vomiting). 30 tablet 2   No current facility-administered medications for this visit.   Facility-Administered Medications Ordered in Other Visits  Medication Dose Route Frequency Provider Last Rate Last Admin  . 0.9 %  sodium chloride infusion   Intravenous Once Truitt Merle, MD      . atropine injection 0.5 mg  0.5 mg Intravenous Once PRN Truitt Merle, MD      . dexamethasone (DECADRON) 10 mg in sodium chloride 0.9 % 50 mL IVPB  10 mg Intravenous Once Truitt Merle, MD      . heparin lock flush 100 unit/mL  500 Units Intracatheter Once PRN Truitt Merle, MD      . irinotecan (CAMPTOSAR) 180 mg in sodium chloride 0.9 % 500 mL chemo infusion  100 mg/m2 (Treatment Plan Recorded) Intravenous Once Truitt Merle, MD      . palonosetron (ALOXI) injection 0.25 mg  0.25 mg Intravenous Once Truitt Merle, MD      . sodium chloride flush (NS) 0.9 % injection 10 mL  10 mL Intracatheter PRN Truitt Merle, MD        PHYSICAL EXAMINATION: ECOG PERFORMANCE STATUS: 2 - Symptomatic, <50% confined to bed  Vitals:   04/30/21 1329  BP: 113/68  Pulse: 75  Resp: 16  Temp: 97.8 F (36.6 C)  SpO2: 98%   Filed Weights   04/30/21 1329  Weight: 124 lb 6.4 oz (56.4 kg)    Due to COVID19 we will limit examination to appearance. Patient had no complaints.  GENERAL:alert, no distress and comfortable SKIN: skin color normal, no rashes or significant lesions EYES: normal, Conjunctiva are pink and non-injected, sclera clear  NEURO: alert & oriented x 3 with fluent speech  LABORATORY DATA:  I have reviewed the data as listed CBC Latest Ref Rng & Units 04/30/2021 04/16/2021  04/03/2021  WBC 4.0 - 10.5 K/uL 21.4(H) 3.5(L) 7.4  Hemoglobin 13.0 - 17.0 g/dL 11.3(L) 12.0(L) 12.6(L)  Hematocrit 39.0 - 52.0 % 34.4(L) 36.2(L) 38.5(L)  Platelets 150 - 400 K/uL 400 271 205     CMP Latest Ref Rng & Units 04/30/2021 04/16/2021 04/03/2021  Glucose 70 - 99 mg/dL 132(H) 129(H) 106(H)  BUN 8 - 23 mg/dL _0 Creatinine 0.61 - 1.24 mg/dL 0.68 0.75 0.68  Sodium 135 - 145 mmol/L 141 135 139  Potassium 3.5 - 5.1 mmol/L  3.9 3.8 3.9  Chloride 98 - 111 mmol/L 102 98 101  CO2 22 - 32 mmol/L _0 Calcium 8.9 - 10.3 mg/dL 9.0 9.4 9.6  Total Protein 6.5 - 8.1 g/dL 6.3(L) 7.0 7.3  Total Bilirubin 0.3 - 1.2 mg/dL 0.5 1.0 1.4(H)  Alkaline Phos 38 - 126 U/L 110 80 84  AST 15 - 41 U/L _1 ALT 0 - 44 U/L _2 RADIOGRAPHIC STUDIES: I have personally reviewed the radiological images as listed and agreed with the findings in the report. No results found.   ASSESSMENT & PLAN:  Toshio Slusher is a 70 y.o. male with   1. Cancer of right colon,with peritonealand lungmetastasis,stage IV,MMR normal, KRAS mutation (+), MSS -He wasdiagnosed in 03/2019. His Colonoscopy biopsy showed adenocarcinoma of right hepatic flexurecolon.His peritoneal biopsy from 04/27/19 showsadenocarcinoma,consistent withmetastasis of his colon cancer. This is now stage IV disease. -He was treated with first-line FOLFOX in June-November 2020 before proceeding with HIPECsurgeryby Dr. Dustin Folks 12/17/19. -Unfortunately he had disease progression on 09/10/20 PET withhypermetabolic pleural (right)and peritoneal nodules, which are consistent with metastatic disease.I do not think we need biopsy to confirm given his previously known peritoneal metastasis. -Istarted him onsecond-line chemo withFOLFIRI and bevacizumab q2weekson 09/18/20. 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 -He was able to undergodiverting colostomy surgeryby Dr Clovis Riley on 02/24/21. His urinary symptoms have improved. He is still interested in chemotherapy to treatment his cancer. -His new baseline CT CAP from 03/25/21 showedNew bilateral pulmonarysmallnodules worrisome for new pulmonary metastatic disease,New small right pleural effusion with overlying atelectasisandNo findings for abdominal/pelvic metastatic disease. -He was restarted on low dose irinotecan alone on 04/03/21. -We discussed adding 5-fu pump. He is comfortable with adding it and would like to start with his next cycle (C4). -Labs reviewed. Overall adequate to proceed with low dose irinotecan at 45% dose reduction today (same as before). I will keep him up to date of dosage each cycle per request.   2. Weight Loss -He has continued to lose weight recently. His appetite has decreased as well. -He experienced vomiting, abdominal cramping, and increased gas following his first treatment. This improved following his second cycle. -He has increased his calorie intake and subsequently gained 3 lbs from C2 to C3.  3. 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 This has caused frequent UTI -This is likely related to hisperitoneal metastasis, chemotherapy and bevacizumab. Chemo has been held since 12/23/20  -He proceeded with fistula takedown surgery with Dr Clovis Riley on 02/24/21. He hascolostomyto by pass rectum.  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 Watt Climes showed internal hemorrhoids, benign polyps, diverticulosis and colon cancer.  -He was treated with oral iron,IV Feraheme on 05/03/19 and 05/16/19(had reaction  with 2nd dose). -With cancer recurrence his anemia recurred.Overall mild  4. Elevated PSA  -PSA was 5.7 in 11/2018 and 4.6 in 01/2019. He has known enlarged prostate. 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 Possible surgery may include removal of bladder and prostate. He will consult with urologist soon.  5.Goal of care discussion  -  The patient understands the goal of care is palliative. -I have recommended DNR, he will think about it   PLAN: -Labs reviewed and adequate to proceed with same low dose Irinotecantoday -Lab, flush, f/u and irinotecan in 2 weeks, add 5-fu pumpfrom next cycle   No problem-specific Assessment & Plan notes found for this encounter.   No orders of the defined types were placed in this encounter.  All questions were answered. The patient knows to call the clinic with any problems, questions or concerns. No barriers to learning was detected. The total time spent in the appointment was 30 minutes.     Truitt Merle, MD 04/30/2021   I, Wilburn Mylar, am acting as scribe for Truitt Merle, MD.   I have reviewed the above documentation for accuracy and completeness, and I agree with the above.

## 2021-04-30 NOTE — Patient Instructions (Addendum)
[facility] MEDICAL ONCOLOGY  Discharge Instructions: ?Thank you for choosing [facility] to provide your oncology and hematology care.  ? ?If you have a lab appointment with the Cancer Center, please go directly to the Cancer Center and check in at the registration area. ?  ?Wear comfortable clothing and clothing appropriate for easy access to any Portacath or PICC line.  ? ?We strive to give you quality time with your provider. You may need to reschedule your appointment if you arrive late (15 or more minutes).  Arriving late affects you and other patients whose appointments are after yours.  Also, if you miss three or more appointments without notifying the office, you may be dismissed from the clinic at the provider?s discretion.    ?  ?For prescription refill requests, have your pharmacy contact our office and allow 72 hours for refills to be completed.   ? ?Today you received the following chemotherapy and/or immunotherapy agents: Irinotecan    ?  ?To help prevent nausea and vomiting after your treatment, we encourage you to take your nausea medication as directed. ? ?BELOW ARE SYMPTOMS THAT SHOULD BE REPORTED IMMEDIATELY: ?*FEVER GREATER THAN 100.4 F (38 ?C) OR HIGHER ?*CHILLS OR SWEATING ?*NAUSEA AND VOMITING THAT IS NOT CONTROLLED WITH YOUR NAUSEA MEDICATION ?*UNUSUAL SHORTNESS OF BREATH ?*UNUSUAL BRUISING OR BLEEDING ?*URINARY PROBLEMS (pain or burning when urinating, or frequent urination) ?*BOWEL PROBLEMS (unusual diarrhea, constipation, pain near the anus) ?TENDERNESS IN MOUTH AND THROAT WITH OR WITHOUT PRESENCE OF ULCERS (sore throat, sores in mouth, or a toothache) ?UNUSUAL RASH, SWELLING OR PAIN  ?UNUSUAL VAGINAL DISCHARGE OR ITCHING  ? ?Items with * indicate a potential emergency and should be followed up as soon as possible or go to the Emergency Department if any problems should occur. ? ?Please show the CHEMOTHERAPY ALERT CARD or IMMUNOTHERAPY ALERT CARD at check-in to  the Emergency Department and triage nurse. ? ?Should you have questions after your visit or need to cancel or reschedule your appointment, please contact [facility]  Dept: [phone]  and follow the prompts.  Office hours are 8:00 a.m. to 4:30 p.m. Monday - Friday. Please note that voicemails left after 4:00 p.m. may not be returned until the following business day.  We are closed weekends and major holidays. You have access to a nurse at all times for urgent questions. Please call the main number to the clinic Dept: [phone] and follow the prompts. ? ? ?For any non-urgent questions, you may also contact your provider using MyChart. We now offer e-Visits for anyone 18 and older to request care online for non-urgent symptoms. For details visit [email or web address]. ?  ?Also download the MyChart app! Go to the app store, search "MyChart", open the app, select [place] [facility], and log in with your MyChart username and password. ? ?Due to Covid, a mask is required upon entering the hospital/clinic. If you do not have a mask, one will be given to you upon arrival. For doctor visits, patients may have 1 support person aged 18 or older with them. For treatment visits, patients cannot have anyone with them due to current Covid guidelines and our immunocompromised population.  ? ?

## 2021-05-01 ENCOUNTER — Ambulatory Visit: Payer: Medicare Other | Admitting: Hematology

## 2021-05-01 ENCOUNTER — Ambulatory Visit: Payer: Medicare Other

## 2021-05-01 ENCOUNTER — Other Ambulatory Visit: Payer: Medicare Other

## 2021-05-12 DIAGNOSIS — Z85038 Personal history of other malignant neoplasm of large intestine: Secondary | ICD-10-CM | POA: Diagnosis not present

## 2021-05-12 DIAGNOSIS — Z933 Colostomy status: Secondary | ICD-10-CM | POA: Diagnosis not present

## 2021-05-14 ENCOUNTER — Other Ambulatory Visit: Payer: Self-pay

## 2021-05-14 ENCOUNTER — Encounter: Payer: Self-pay | Admitting: Nurse Practitioner

## 2021-05-14 ENCOUNTER — Inpatient Hospital Stay: Payer: Medicare Other | Attending: Hematology

## 2021-05-14 ENCOUNTER — Inpatient Hospital Stay (HOSPITAL_BASED_OUTPATIENT_CLINIC_OR_DEPARTMENT_OTHER): Payer: Medicare Other | Admitting: Nurse Practitioner

## 2021-05-14 ENCOUNTER — Inpatient Hospital Stay: Payer: Medicare Other

## 2021-05-14 VITALS — BP 115/71 | HR 82 | Temp 97.8°F | Resp 18 | Ht 66.0 in | Wt 125.3 lb

## 2021-05-14 DIAGNOSIS — D500 Iron deficiency anemia secondary to blood loss (chronic): Secondary | ICD-10-CM

## 2021-05-14 DIAGNOSIS — R972 Elevated prostate specific antigen [PSA]: Secondary | ICD-10-CM | POA: Diagnosis not present

## 2021-05-14 DIAGNOSIS — C182 Malignant neoplasm of ascending colon: Secondary | ICD-10-CM | POA: Insufficient documentation

## 2021-05-14 DIAGNOSIS — Z5189 Encounter for other specified aftercare: Secondary | ICD-10-CM | POA: Insufficient documentation

## 2021-05-14 DIAGNOSIS — Z452 Encounter for adjustment and management of vascular access device: Secondary | ICD-10-CM | POA: Diagnosis not present

## 2021-05-14 DIAGNOSIS — Z933 Colostomy status: Secondary | ICD-10-CM | POA: Insufficient documentation

## 2021-05-14 DIAGNOSIS — Z5111 Encounter for antineoplastic chemotherapy: Secondary | ICD-10-CM | POA: Diagnosis not present

## 2021-05-14 DIAGNOSIS — N200 Calculus of kidney: Secondary | ICD-10-CM | POA: Insufficient documentation

## 2021-05-14 DIAGNOSIS — Z9221 Personal history of antineoplastic chemotherapy: Secondary | ICD-10-CM | POA: Insufficient documentation

## 2021-05-14 DIAGNOSIS — N400 Enlarged prostate without lower urinary tract symptoms: Secondary | ICD-10-CM | POA: Diagnosis not present

## 2021-05-14 DIAGNOSIS — C786 Secondary malignant neoplasm of retroperitoneum and peritoneum: Secondary | ICD-10-CM | POA: Insufficient documentation

## 2021-05-14 DIAGNOSIS — Z95828 Presence of other vascular implants and grafts: Secondary | ICD-10-CM

## 2021-05-14 LAB — CMP (CANCER CENTER ONLY)
ALT: 11 U/L (ref 0–44)
AST: 17 U/L (ref 15–41)
Albumin: 3.2 g/dL — ABNORMAL LOW (ref 3.5–5.0)
Alkaline Phosphatase: 69 U/L (ref 38–126)
Anion gap: 9 (ref 5–15)
BUN: 12 mg/dL (ref 8–23)
CO2: 27 mmol/L (ref 22–32)
Calcium: 9 mg/dL (ref 8.9–10.3)
Chloride: 106 mmol/L (ref 98–111)
Creatinine: 0.67 mg/dL (ref 0.61–1.24)
GFR, Estimated: >60 mL/min (ref >60)
Glucose, Bld: 95 mg/dL (ref 70–99)
Potassium: 3.6 mmol/L (ref 3.5–5.1)
Sodium: 142 mmol/L (ref 135–145)
Total Bilirubin: 0.5 mg/dL (ref 0.3–1.2)
Total Protein: 6.2 g/dL — ABNORMAL LOW (ref 6.5–8.1)

## 2021-05-14 LAB — CBC WITH DIFFERENTIAL (CANCER CENTER ONLY)
Abs Immature Granulocytes: 0.03 10*3/uL (ref 0.00–0.07)
Basophils Absolute: 0.1 10*3/uL (ref 0.0–0.1)
Basophils Relative: 1 %
Eosinophils Absolute: 0.1 10*3/uL (ref 0.0–0.5)
Eosinophils Relative: 2 %
HCT: 31 % — ABNORMAL LOW (ref 39.0–52.0)
Hemoglobin: 10.4 g/dL — ABNORMAL LOW (ref 13.0–17.0)
Immature Granulocytes: 0 %
Lymphocytes Relative: 33 %
Lymphs Abs: 2.7 10*3/uL (ref 0.7–4.0)
MCH: 28.1 pg (ref 26.0–34.0)
MCHC: 33.5 g/dL (ref 30.0–36.0)
MCV: 83.8 fL (ref 80.0–100.0)
Monocytes Absolute: 0.9 10*3/uL (ref 0.1–1.0)
Monocytes Relative: 11 %
Neutro Abs: 4.4 10*3/uL (ref 1.7–7.7)
Neutrophils Relative %: 53 %
Platelet Count: 365 10*3/uL (ref 150–400)
RBC: 3.7 MIL/uL — ABNORMAL LOW (ref 4.22–5.81)
RDW: 16.9 % — ABNORMAL HIGH (ref 11.5–15.5)
WBC Count: 8.2 10*3/uL (ref 4.0–10.5)
nRBC: 0 % (ref 0.0–0.2)

## 2021-05-14 MED ORDER — OXYCODONE HCL 5 MG PO TABS: 5.0000 mg | ORAL_TABLET | Freq: Four times a day (QID) | ORAL | 0 refills | Status: DC | PRN

## 2021-05-14 MED ORDER — SODIUM CHLORIDE 0.9 % IV SOLN
Freq: Once | INTRAVENOUS | Status: AC
  Filled 2021-05-14: qty 250

## 2021-05-14 MED ORDER — PALONOSETRON HCL INJECTION 0.25 MG/5ML
0.2500 mg | Freq: Once | INTRAVENOUS | Status: AC
Start: 2021-05-14 — End: 2021-05-14
  Administered 2021-05-14: 0.25 mg via INTRAVENOUS

## 2021-05-14 MED ORDER — SODIUM CHLORIDE 0.9% FLUSH
10.0000 mL | INTRAVENOUS | Status: DC | PRN
  Administered 2021-05-14: 10 mL
  Filled 2021-05-14: qty 10

## 2021-05-14 MED ORDER — SODIUM CHLORIDE 0.9 % IV SOLN
2000.0000 mg/m2 | INTRAVENOUS | Status: DC
  Administered 2021-05-14: 3250 mg via INTRAVENOUS
  Filled 2021-05-14: qty 65

## 2021-05-14 MED ORDER — PALONOSETRON HCL INJECTION 0.25 MG/5ML
INTRAVENOUS | Status: AC
  Filled 2021-05-14: qty 5

## 2021-05-14 MED ORDER — ATROPINE SULFATE 1 MG/ML IJ SOLN
0.5000 mg | Freq: Once | INTRAMUSCULAR | Status: AC | PRN
  Administered 2021-05-14: 0.5 mg via INTRAVENOUS

## 2021-05-14 MED ORDER — SODIUM CHLORIDE 0.9 % IV SOLN
10.0000 mg | Freq: Once | INTRAVENOUS | Status: AC
  Administered 2021-05-14: 10 mg via INTRAVENOUS
  Filled 2021-05-14: qty 10

## 2021-05-14 MED ORDER — ATROPINE SULFATE 1 MG/ML IJ SOLN
INTRAMUSCULAR | Status: AC
  Filled 2021-05-14: qty 1

## 2021-05-14 MED ORDER — SODIUM CHLORIDE 0.9 % IV SOLN
100.0000 mg/m2 | Freq: Once | INTRAVENOUS | Status: AC
  Administered 2021-05-14: 160 mg via INTRAVENOUS
  Filled 2021-05-14: qty 8

## 2021-05-14 MED ORDER — SODIUM CHLORIDE 0.9 % IV SOLN
400.0000 mg/m2 | Freq: Once | INTRAVENOUS | Status: AC
  Administered 2021-05-14: 652 mg via INTRAVENOUS
  Filled 2021-05-14: qty 32.6

## 2021-05-14 NOTE — Progress Notes (Signed)
Nathaniel Norton   Telephone:([phone] Fax:([phone]) [phone]   Clinic Follow up Note   Patient Care Team: Lajean Manes, MD as PCP - General (Internal Medicine) Berle Mull, MD as Consulting Physician (Family Medicine) Clarene Essex, MD as Consulting Physician (Gastroenterology) Truitt Merle, MD as Consulting Physician (Oncology) 05/14/2021  CHIEF COMPLAINT: Follow up metastatic colon cancer   SUMMARY OF ONCOLOGIC HISTORY: Oncology History Overview Note  Cancer Staging Cancer of right colon [facility]) Staging form: Colon and Rectum, AJCC 8th Edition - Clinical stage from 04/09/2019: Stage IVC (cTX, cNX, pM1c) - Signed by Truitt Merle, MD on 05/03/2019     Cancer of right colon [facility])  04/09/2019 Procedure   Colonoscopy 04/09/19 by Dr Watt Climes IMPRESSION -internal hemorrhoids -Diverticulosis in the sigmoid colon  -2 small polyps in the rectum and in the proximal transverse colon, removed with a hot snare. Resected and retrieved.  -3 medium polyps in the proximal transverse colon, in the mid transverse colon and in the distal transverse colon, removed and resected and retrieved.  -likely malignant partially obstructing tumor at the hepatic flexure. biopsied, tattooed.  -1 large polyp in the mid ascending colon  -the examination was otherwise normal     04/09/2019 Initial Biopsy   FINAL MICROSCOPIC DIAGNOSIS: 04/09/19 1. LG intestine-hepatic flexure, Biopsy:   INVASIVE WELL DIFFERENTIATED ADENOCARCINOMA   04/09/2019 Cancer Staging   Staging form: Colon and Rectum, AJCC 8th Edition - Clinical stage from 04/09/2019: Stage IVC (cTX, cNX, pM1c) - Signed by Truitt Merle, MD on 05/03/2019   04/10/2019 Imaging   CT AP 04/10/19  IMPRESSION: 1. There is an eccentric mass of the colon involving the ascending colon near the hepatic flexure measuring approximately 3.4 x 3.4 by 2.0 cm (series 2, image 37, series 3, image 37). There is extensive soft tissue nodularity of the mesocolon and omentum,  and likely areas of the peritoneum, for example bilateral upper quadrants (series 2, image 25). Findings are consistent with primary colon malignancy, probable omental and peritoneal involvement, and small volume malignant ascites. 2.  Other chronic and incidental findings as detailed above.   04/20/2019 Initial Diagnosis   Cancer of right colon ([place])   04/26/2019 Imaging   CT Chest 04/26/19 IMPRESSION: 1. Borderline to mild lower thoracic adenopathy, including within the right internal mammary and juxta cardiophrenic stations. Given the appearance of the upper abdomen, suspicious for nodal metastasis. 2. A low right paratracheal node is borderline sized, but favored to be reactive. 3. No evidence of pulmonary metastasis. 4. Peritoneal metastasis and abdominal ascites, as before. 5. Pancreatic parenchymal calcifications indicative of chronic calcific pancreatitis. 6. Coronary artery atherosclerosis.   04/27/2019 Pathology Results   Diagnosis 04/27/19 Peritoneum, biopsy, right upper quadrant, perihepatic - ADENOCARCINOMA, CONSISTENT WITH COLONIC PRIMARY. - SEE COMMENT.   05/16/2019 - 10/31/2019 Chemotherapy   First line FOLFOX every 2 weeks with Avastin starting with cycle 2 starting 05/16/19. 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 Stopped after 10/31/19 for surgery.    07/23/2019 Imaging    CT AP W Contrast  IMPRESSION: 1. Primary ascending colon mass may be minimally smaller. Peritoneal metastatic disease appears slightly improved as well. 2. Chronic calcific pancreatitis. 3. Tiny left renal stone. 4. Small left inguinal hernia contains a knuckle of unobstructed colon. 5. Enlarged prostate.   10/29/2019 Imaging   CT AP W Contrast IMPRESSION: No significant change in  small ascending colon soft tissue mass  and diffuse omental carcinomatosis.   No new or progressive metastatic disease identified.   Stable small left inguinal hernia containing a loop of sigmoid colon. No evidence of bowel obstruction or strangulation.   Colonic diverticulosis. No radiographic evidence of diverticulitis.   Stable enlarged prostate.   12/17/2019 Pathology Results   HIPEC Surgery by Dr Levine 12/17/19 FINAL PATHOLOGIC DIAGNOSIS  MICROSCOPIC EXAMINATION AND DIAGNOSIS  A.          "RIGHT COLON, OMENTUM, SPLEEN":       Invasive adenocarcinoma with mucinous features, moderately differentiated.  Tumor involves mesentery and spleen.  Five out of ten lymph nodes involved by adenocarcinoma with perinodal soft tissue involvement (5/10), see comment.  Margins are uninvolved by invasive carcinoma.  Ileum and appendix, uninvolved.  See comment and cancer case summary.  B.          "ROUND LIGAMENT OF LIVER":       Involved by adenocarcinoma with mucinous features, moderately differentiated.  C.          "RIGHT DIAPHRAM STRIPPING":       Negative for carcinoma.  D.          "RIGHT HEPATIC CAPSULECTOMY":       Chronic inflammation and fibrosis.  Negative for carcinoma.  E.          "DEBRIDED TUMOR":       Involved by adenocarcinoma with mucinous features, moderately differentiated.   COMMENT: Sections disclose five out of ten lymph nodes involved by adenocarcinoma with perinodal soft tissue involvement. However, it is not possible to be certain if this represents lymph nodes replaced by tumor and/or soft tissue involvement. In addition, focal perineural invasion is seen.   04/03/2020 Imaging   CT AP W contrast  IMPRESSION: 1. Status post interval splenectomy and right hemicolectomy. 2. Response to therapy of omental/peritoneal metastasis. The only residual indeterminate finding is fluid density along the capsule of the hepatic dome which could be postoperative or secondary to localized residual  peritoneal disease. 3. Left nephrolithiasis. 4.  Possible constipation. 5. Prostatomegaly. 6.  Aortic Atherosclerosis (ICD10-I70.0).   07/11/2020 Imaging   CT AP  1.  Postsurgical changes of subtle reductive surgery including right hemicolectomy, omentectomy, splenectomy, and right hepatic capsulectomy.  2.  Interval development of multiple areas of low attenuation involving the liver, as described above. This the intraparenchymal low-density lesion is concerning for metastatic disease. Other disease along the capsule may be post therapy related. Residual thickening of the anterior peritoneum particularly anterior to the right lobe of the liver is also concerning for some residual disease although appears significantly improved. Recommend attention on follow-up.  3.  No definite evidence of residual peritoneal disease status post HIPEC.   09/18/2020 -  Chemotherapy   Second line chemo FOLFIRI and bevacizumab q2weeks starting 09/18/20. Chemo on hold since 12/23/20 due to fistula. RESTART with low dose irinotecan alone on 04/03/21.   12/19/2020 Imaging   CT CAP  IMPRESSION: 1. Interval development of a 1.6 x 2.7 x 1.6 cm collection of gas and debris between the sigmoid colon and dome of bladder, compatible with small abscess. A portion of this abscess is probably intramural at the dome of bladder. Marked associated thickening of the adjacent colonic and bladder walls with tiny gas bubbles in the thickened bladder wall and prominent amount of free gas in the bladder lumen suggests an associated colovesical fistula. Tiny gas bubbles are also seen in the central   prostate gland and may be in the urethra although parenchymal gas within the prostate gland is not excluded on this exam. 2. Nodularity along the major and minor fissures of the right hemithorax has decreased in the interval, nearly imperceptible today. 3. Peritoneal implants along the liver, right abdomen, and deep to the left  paramidline anterior abdominal wall have resolved in the interval by CT imaging. 4. Tiny right lower lobe pulmonary nodule is more conspicuous today. Attention on follow-up recommended. 5. Stable 4 mm hypodensity in the dome of the right liver, too small to characterize. 6. Nonobstructing left renal stones with bilateral renal cysts. 7. Stable compression deformity at multiple thoracic and lumbar levels. 8. Aortic Atherosclerosis (ICD10-I70.0).   02/24/2021 Surgery   LAPAROTOMY EXPLORATORY resection of sigmoid colon and proximal rectum, takedown of colovesical fistula, creation of end ileosotmy by Dr Levine    Final Pathologic Diagnosis      A.  SIGMOID COLON AND RECTUM, RESECTION:               Invasive adenocarcinoma with mucinous features, moderately differentiated.               Tumor measures 3.2 cm in greatest dimension. Tumor invades visceral peritoneum.  Lymphovascular invasion is present.               Metastatic adenocarcinoma involving seven (of 22) lymph nodes (7/22).  One margin involved by adenocarcinoma (grossly presumed distal margin).     03/25/2021 Imaging   IMPRESSION: 1. New bilateral pulmonary nodules worrisome for new pulmonary metastatic disease. 2. New small right pleural effusion with overlying atelectasis. 3. Recurrent pleural nodularity on the right. 4. No findings for abdominal/pelvic metastatic disease. 5. Stable severe atrophy of the pancreatic body and tail with associated main pancreatic duct dilatation. No obvious obstructing pancreatic lesion but this was not present on the prior CT scan from 04/03/2020. It may be due to a stricture or a small ductal lesion, not well seen. MRI abdomen without and with contrast may be helpful for further evaluation of this finding. 6. Stable surgical changes involving the colon with a Hartmann's pouch and left lower quadrant colostomy. 7. Stable enlarged prostate gland. 8. Stable thoracic and lumbar compression  fractures.     CURRENT THERAPY: Second line chemo FOLFIRI andbevacizumabq2weeks starting 09/18/20. Chemo onholdsince 12/23/20 due to fistula.RESTARTwith low dose irinotecan alone on 04/03/21.  INTERVAL HISTORY: Nathaniel Norton returns for follow up as scheduled. He was last seen 04/30/21 and completed another cycle of low dose single agent irinotecan. The plan is to add 5FU this cycle.  He had less nausea/vomiting with this cycle, Compazine and Zofran are partially effective.  He is eating higher calorie foods which helps his stools, which are now more formed but still soft/loose.  Ostomy output slows for a few days following treatment.  He had a good week, able to be active and work outside.  He has mild exertional dyspnea.  Neuropathy in the feet is more noticeable in the summer when he is barefoot, but overall stable.  Abdominal pain is managed with Doxy 1 tab every 8 hours.  Denies fever, chills, cough, chest pain, leg edema, or new concerns.    MEDICAL HISTORY:  Past Medical History:  Diagnosis Date  . colon ca dx'd 03/2019    SURGICAL HISTORY: Past Surgical History:  Procedure Laterality Date  . CATARACT EXTRACTION    . IR IMAGING GUIDED PORT INSERTION  05/10/2019  . L4 fracture  2012  .   RETINAL DETACHMENT SURGERY    . Right hand surgery  1974    I have reviewed the social history and family history with the patient and they are unchanged from previous note.  ALLERGIES:  is allergic to feraheme [ferumoxytol] and codeine.  MEDICATIONS:  Current Outpatient Medications  Medication Sig Dispense Refill  . diphenoxylate-atropine (LOMOTIL) 2.5-0.025 MG tablet Take 1-2 tablets by mouth 4 (four) times daily as needed for diarrhea or loose stools. 90 tablet 1  . erythromycin base (E-MYCIN) 500 MG tablet Take by mouth.    . fluticasone (FLONASE) 50 MCG/ACT nasal spray Place into the nose.    . lidocaine-prilocaine (EMLA) cream Apply 1 application topically as needed. 30 g 1  . Multiple  Vitamin (MULTIVITAMIN) tablet Take 1 tablet by mouth daily.    . MULTIPLE VITAMIN PO     . neomycin (MYCIFRADIN) 500 MG tablet Take 1,000 mg by mouth 3 (three) times daily.    . ondansetron (ZOFRAN) 8 MG tablet Take 1 tablet (8 mg total) by mouth every 8 (eight) hours as needed for nausea or vomiting. Start on day 3 after chemotherapy 20 tablet 0  . ondansetron (ZOFRAN-ODT) 4 MG disintegrating tablet Take 4 mg by mouth every 8 (eight) hours as needed.    Marland Kitchen oxyCODONE (OXY IR/ROXICODONE) 5 MG immediate release tablet Take 1 tablet (5 mg total) by mouth every 6 (six) hours as needed for severe pain. (Patient taking differently: Take 5 mg by mouth every 8 (eight) hours.) 90 tablet 0  . prochlorperazine (COMPAZINE) 10 MG tablet Take 1 tablet (10 mg total) by mouth every 6 (six) hours as needed (Nausea or vomiting). 30 tablet 2  . sodium chloride flush 0.9 % SOLN injection      No current facility-administered medications for this visit.   Facility-Administered Medications Ordered in Other Visits  Medication Dose Route Frequency Provider Last Rate Last Admin  . atropine injection 0.5 mg  0.5 mg Intravenous Once PRN Truitt Merle, MD      . fluorouracil (ADRUCIL) 3,250 mg in sodium chloride 0.9 % 85 mL chemo infusion  2,000 mg/m2 (Treatment Plan Recorded) Intravenous 1 day or 1 dose Truitt Merle, MD      . irinotecan (CAMPTOSAR) 160 mg in sodium chloride 0.9 % 500 mL chemo infusion  100 mg/m2 (Treatment Plan Recorded) Intravenous Once Truitt Merle, MD      . leucovorin 652 mg in sodium chloride 0.9 % 250 mL infusion  400 mg/m2 (Treatment Plan Recorded) Intravenous Once Truitt Merle, MD        PHYSICAL EXAMINATION: ECOG PERFORMANCE STATUS: 1 - Symptomatic but completely ambulatory  Vitals:   05/14/21 0947  BP: 115/71  Pulse: 82  Resp: 18  Temp: 97.8 F (36.6 C)  SpO2: 100%   Filed Weights   05/14/21 0947  Weight: 125 lb 4.8 oz (56.8 kg)    GENERAL:alert, no distress and comfortable SKIN: No  rash EYES: sclera clear LUNGS: clear, normal breathing effort HEART: regular rate & rhythm, no lower extremity edema ABDOMEN: LLQ colostomy with loose stool in collection bag NEURO: alert & oriented x 3 with fluent speech, no focal motor/sensory deficits PAC without erythema  LABORATORY DATA:  I have reviewed the data as listed CBC Latest Ref Rng & Units 05/14/2021 04/30/2021 04/16/2021  WBC 4.0 - 10.5 K/uL 8.2 21.4(H) 3.5(L)  Hemoglobin 13.0 - 17.0 g/dL 10.4(L) 11.3(L) 12.0(L)  Hematocrit 39.0 - 52.0 % 31.0(L) 34.4(L) 36.2(L)  Platelets 150 - 400 K/uL 365 400  271     CMP Latest Ref Rng & Units 05/14/2021 04/30/2021 04/16/2021  Glucose 70 - 99 mg/dL 95 132(H) 129(H)  BUN 8 - 23 mg/dL _0 Creatinine 0.61 - 1.24 mg/dL 0.67 0.68 0.75  Sodium 135 - 145 mmol/L 142 141 135  Potassium 3.5 - 5.1 mmol/L 3.6 3.9 3.8  Chloride 98 - 111 mmol/L 106 102 98  CO2 22 - 32 mmol/L _1 Calcium 8.9 - 10.3 mg/dL 9.0 9.0 9.4  Total Protein 6.5 - 8.1 g/dL 6.2(L) 6.3(L) 7.0  Total Bilirubin 0.3 - 1.2 mg/dL 0.5 0.5 1.0  Alkaline Phos 38 - 126 U/L 69 110 80  AST 15 - 41 U/L _2 ALT 0 - 44 U/L _3 RADIOGRAPHIC STUDIES: I have personally reviewed the radiological images as listed and agreed with the findings in the report. No results found.   ASSESSMENT & PLAN: Nathaniel Norton a 70 y.o.malewith    1. 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 His Colonoscopy biopsy showed adenocarcinoma of right hepatic flexurecolon.His peritoneal biopsy from 04/27/19 showsadenocarcinoma,consistent withmetastasis of his colon cancer. This is now stage IV disease. -He was treated with first-line FOLFOX in June-November 2020 before proceeding with HIPECsurgeryby Dr. Dustin Folks 12/17/19. -Unfortunately he had disease progression on 09/10/20 PET withhypermetabolic pleural (rigt)and peritoneal nodules, which  are consistent with metastatic disease.A biopsy was not felt to be necessarygiven his previously known peritoneal metastasis -He started second lineFOLFIRI and bevacizumab q2weekson 09/18/20, tolerated first 2 cycles poorly with abdominal pain/cramping and diarrhea. Cycle 3 was postponed, required supportive care -Recovered well,he resumed chemo with cycle 3on 11/17, irinotecanat 45% dose reduction 100 mg/m2 -Restaging CT 12/19/2020 showed excellent response but he unfortunately developed a fistula between sigmoid colon and the bladder, and a small pelvic abscess and chemo was held -He underwent diverting colostomy by Dr. Clovis Riley on 02/24/2021.  He recovered well and resumed treatment -New baseline CT 03/25/2021 showed new bilateral pulmonary small nodules, worrisome for new pulmonary metastatic disease.  No abdominopelvic metastatic disease -Resumed low-dose single agent irinotecan on 04/03/2021, s/p 3 cycles -Plan to add low-dose 5-FU today  2.  Sigmoid colon and bladder fistula, small pelvic abscess -He developed UTI like dysuria, and "air bubble and urine" in late 2021 -CT 12/2020 showed fistula and small abscess, thought to be combination from peritoneal metastasis, chemo, and bevacizumab -Treatment on hold since 12/23/2020 -S/p fistula takedown and colostomy by Dr. 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 Watt Climes showed internal hemorrhoids, benign polyps, diverticulosis and colon cancer.  -He was treated with oral iron,IV Feraheme on 05/03/19 and 05/16/19(had reaction with 2nd dose). -With cancer recurrence his anemia recurred.Overall mild -Monitoring  4. Elevated PSA  -PSA was 5.7 in 11/2018 and 4.6 in 01/2019. He has known enlarged prostate. -His 09/10/20 PET shows kidney stones and cysts andProstatomegaly, reflecting BPH -He recently developed nocturia,  mild dysuria, and frequency with low urine output. He was not able to provide urine sample on 11/17 -dysuria resolved after receiving chemo on 11/17. He has been referred to urology  5.Goal of care discussion  -The patient understands the goal of care is palliative. -he is full code now  Disposition: Nathaniel Norton appears stable.  He has completed 3 cycles of low-dose single agent irinotecan since resuming chemo on  04/03/2021.  He tolerated last cycle much better, less N/V, weight gain, and more energy.  Neuropathy is stable.  He is able to recover and function well.  Abdominal pain is managed with oxycodone 1 tab every 8 as needed, refilled today.  There is no evidence of disease progression.  Labs reviewed, proceed with irinotecan 160 mg same dose, and add 5FU 2000 mg/m2, no GCSF this cycle. F/up and next cycle in 2 weeks.   The plan was discussed with Dr. Feng.  All questions were answered. The patient knows to call the clinic with any problems, questions or concerns. No barriers to learning were detected.  Total encounter time was 35 minutes.     Lacie K Burton, NP 05/14/21    

## 2021-05-14 NOTE — Addendum Note (Signed)
Addended by: Alla Feeling on: 05/14/2021 12:31 PM   Modules accepted: Orders

## 2021-05-15 ENCOUNTER — Telehealth: Payer: Self-pay | Admitting: Hematology

## 2021-05-15 NOTE — Telephone Encounter (Signed)
Scheduled follow-up appointments per 6/2 los. Patient is aware. Mailed calendar.

## 2021-05-16 ENCOUNTER — Inpatient Hospital Stay: Payer: Medicare Other

## 2021-05-16 ENCOUNTER — Other Ambulatory Visit: Payer: Self-pay

## 2021-05-16 VITALS — BP 130/64 | HR 73 | Temp 97.7°F | Resp 16

## 2021-05-16 DIAGNOSIS — C182 Malignant neoplasm of ascending colon: Secondary | ICD-10-CM | POA: Diagnosis not present

## 2021-05-16 DIAGNOSIS — Z9221 Personal history of antineoplastic chemotherapy: Secondary | ICD-10-CM | POA: Diagnosis not present

## 2021-05-16 DIAGNOSIS — C786 Secondary malignant neoplasm of retroperitoneum and peritoneum: Secondary | ICD-10-CM | POA: Diagnosis not present

## 2021-05-16 DIAGNOSIS — Z452 Encounter for adjustment and management of vascular access device: Secondary | ICD-10-CM | POA: Diagnosis not present

## 2021-05-16 DIAGNOSIS — N200 Calculus of kidney: Secondary | ICD-10-CM | POA: Diagnosis not present

## 2021-05-16 DIAGNOSIS — Z5111 Encounter for antineoplastic chemotherapy: Secondary | ICD-10-CM | POA: Diagnosis not present

## 2021-05-16 DIAGNOSIS — Z5189 Encounter for other specified aftercare: Secondary | ICD-10-CM | POA: Diagnosis not present

## 2021-05-16 DIAGNOSIS — Z933 Colostomy status: Secondary | ICD-10-CM | POA: Diagnosis not present

## 2021-05-16 DIAGNOSIS — D500 Iron deficiency anemia secondary to blood loss (chronic): Secondary | ICD-10-CM | POA: Diagnosis not present

## 2021-05-16 MED ORDER — SODIUM CHLORIDE 0.9% FLUSH
10.0000 mL | INTRAVENOUS | Status: DC | PRN
  Administered 2021-05-16: 10 mL
  Filled 2021-05-16: qty 10

## 2021-05-16 MED ORDER — HEPARIN SOD (PORK) LOCK FLUSH 100 UNIT/ML IV SOLN
250.0000 [IU] | Freq: Once | INTRAVENOUS | Status: AC | PRN
  Administered 2021-05-16: 250 [IU]
  Filled 2021-05-16: qty 5

## 2021-05-27 NOTE — Progress Notes (Signed)
Nathaniel Norton   Telephone:(336) [phone] Fax:(336) [phone]   Clinic Follow up Note   Patient Care Team: Lajean Manes, MD as PCP - General (Internal Medicine) Berle Mull, MD as Consulting Physician (Family Medicine) Clarene Essex, MD as Consulting Physician (Gastroenterology) Truitt Merle, MD as Consulting Physician (Oncology)  Date of Service:  05/28/2021  CHIEF COMPLAINT: f/u of metastatic colon cancer  SUMMARY OF ONCOLOGIC HISTORY: Oncology History Overview Note  Cancer Staging Cancer of right colon [facility]) Staging form: Colon and Rectum, AJCC 8th Edition - Clinical stage from 04/09/2019: Stage IVC (cTX, cNX, pM1c) - Signed by Truitt Merle, MD on 05/03/2019      Cancer of right colon [facility])  04/09/2019 Procedure   Colonoscopy 04/09/19 by Dr Watt Climes IMPRESSION -internal hemorrhoids -Diverticulosis in the sigmoid colon  -2 small polyps in the rectum and in the proximal transverse colon, removed with a hot snare. Resected and retrieved.  -3 medium polyps in the proximal transverse colon, in the mid transverse colon and in the distal transverse colon, removed and resected and retrieved.  -likely malignant partially obstructing tumor at the hepatic flexure. biopsied, tattooed.  -1 large polyp in the mid ascending colon  -the examination was otherwise normal      04/09/2019 Initial Biopsy   FINAL MICROSCOPIC DIAGNOSIS: 04/09/19 1. LG intestine-hepatic flexure, Biopsy:   INVASIVE WELL DIFFERENTIATED ADENOCARCINOMA    04/09/2019 Cancer Staging   Staging form: Colon and Rectum, AJCC 8th Edition - Clinical stage from 04/09/2019: Stage IVC (cTX, cNX, pM1c) - Signed by Truitt Merle, MD on 05/03/2019    04/10/2019 Imaging   CT AP 04/10/19  IMPRESSION: 1. There is an eccentric mass of the colon involving the ascending colon near the hepatic flexure measuring approximately 3.4 x 3.4 by 2.0 cm (series 2, image 37, series 3, image 37). There is extensive soft tissue nodularity of the  mesocolon and omentum, and likely areas of the peritoneum, for example bilateral upper quadrants (series 2, image 25). Findings are consistent with primary colon malignancy, probable omental and peritoneal involvement, and small volume malignant ascites. 2.  Other chronic and incidental findings as detailed above.    04/20/2019 Initial Diagnosis   Cancer of right colon ([place])    04/26/2019 Imaging   CT Chest 04/26/19 IMPRESSION: 1. Borderline to mild lower thoracic adenopathy, including within the right internal mammary and juxta cardiophrenic stations. Given the appearance of the upper abdomen, suspicious for nodal metastasis. 2. A low right paratracheal node is borderline sized, but favored to be reactive. 3. No evidence of pulmonary metastasis. 4. Peritoneal metastasis and abdominal ascites, as before. 5. Pancreatic parenchymal calcifications indicative of chronic calcific pancreatitis. 6. Coronary artery atherosclerosis.    04/27/2019 Pathology Results   Diagnosis 04/27/19 Peritoneum, biopsy, right upper quadrant, perihepatic - ADENOCARCINOMA, CONSISTENT WITH COLONIC PRIMARY. - SEE COMMENT.    05/16/2019 - 10/31/2019 Chemotherapy   First line FOLFOX every 2 weeks with Avastin starting with cycle 2 starting 05/16/19. 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 Stopped after 10/31/19 for surgery.    07/23/2019 Imaging    CT AP W Contrast  IMPRESSION: 1. Primary ascending colon mass may be minimally smaller. Peritoneal metastatic disease appears slightly improved as well. 2. Chronic calcific pancreatitis. 3. Tiny left renal stone. 4. Small left inguinal hernia contains a knuckle of unobstructed colon. 5. Enlarged prostate.   10/29/2019  Imaging   CT AP W Contrast IMPRESSION: No significant change in small ascending  colon soft tissue mass and diffuse omental carcinomatosis.   No new or progressive metastatic disease identified.   Stable small left inguinal hernia containing a loop of sigmoid colon. No evidence of bowel obstruction or strangulation.   Colonic diverticulosis. No radiographic evidence of diverticulitis.   Stable enlarged prostate.   12/17/2019 Pathology Results   HIPEC Surgery by Dr Clovis Riley 12/17/19 FINAL PATHOLOGIC DIAGNOSIS  MICROSCOPIC EXAMINATION AND DIAGNOSIS  A.          "RIGHT COLON, OMENTUM, SPLEEN":       Invasive adenocarcinoma with mucinous features, moderately differentiated.  Tumor involves mesentery and spleen.  Five out of ten lymph nodes involved by adenocarcinoma with perinodal soft tissue involvement (5/10), see comment.  Margins are uninvolved by invasive carcinoma.  Ileum and appendix, uninvolved.  See comment and cancer case summary.  B.          "ROUND LIGAMENT OF LIVER":       Involved by adenocarcinoma with mucinous features, moderately differentiated.  C.          "RIGHT DIAPHRAM STRIPPING":       Negative for carcinoma.  D.          "RIGHT HEPATIC CAPSULECTOMY":       Chronic inflammation and fibrosis.  Negative for carcinoma.  E.          "DEBRIDED TUMOR":       Involved by adenocarcinoma with mucinous features, moderately differentiated.   COMMENT: Sections disclose five out of ten lymph nodes involved by adenocarcinoma with perinodal soft tissue involvement. However, it is not possible to be certain if this represents lymph nodes replaced by tumor and/or soft tissue involvement. In addition, focal perineural invasion is seen.   04/03/2020 Imaging   CT AP W contrast  IMPRESSION: 1. Status post interval splenectomy and right hemicolectomy. 2. Response to therapy of omental/peritoneal metastasis. The only residual indeterminate finding is fluid density along the capsule of the hepatic dome which could be postoperative or secondary  to localized residual peritoneal disease. 3. Left nephrolithiasis. 4.  Possible constipation. 5. Prostatomegaly. 6.  Aortic Atherosclerosis (ICD10-I70.0).   07/11/2020 Imaging   CT AP  1.  Postsurgical changes of subtle reductive surgery including right hemicolectomy, omentectomy, splenectomy, and right hepatic capsulectomy.  2.  Interval development of multiple areas of low attenuation involving the liver, as described above. This the intraparenchymal low-density lesion is concerning for metastatic disease. Other disease along the capsule may be post therapy related. Residual thickening of the anterior peritoneum particularly anterior to the right lobe of the liver is also concerning for some residual disease although appears significantly improved. Recommend attention on follow-up.  3.  No definite evidence of residual peritoneal disease status post HIPEC.   09/18/2020 -  Chemotherapy   Second line chemo FOLFIRI and bevacizumab q2weeks starting 09/18/20. Chemo on hold since 12/23/20 due to fistula. RESTART with low dose irinotecan alone on 04/03/21.   12/19/2020 Imaging   CT CAP  IMPRESSION: 1. Interval development of a 1.6 x 2.7 x 1.6 cm collection of gas and debris between the sigmoid colon and dome of bladder, compatible with small abscess. A portion of this abscess is probably intramural at the dome of bladder. Marked associated thickening of the adjacent colonic and bladder walls with tiny gas bubbles in the thickened bladder wall and prominent amount of free gas in the bladder lumen suggests an  associated colovesical fistula. Tiny gas bubbles are also seen in the central prostate gland and may be in the urethra although parenchymal gas within the prostate gland is not excluded on this exam. 2. Nodularity along the major and minor fissures of the right hemithorax has decreased in the interval, nearly imperceptible today. 3. Peritoneal implants along the liver, right abdomen, and deep  to the left paramidline anterior abdominal wall have resolved in the interval by CT imaging. 4. Tiny right lower lobe pulmonary nodule is more conspicuous today. Attention on follow-up recommended. 5. Stable 4 mm hypodensity in the dome of the right liver, too small to characterize. 6. Nonobstructing left renal stones with bilateral renal cysts. 7. Stable compression deformity at multiple thoracic and lumbar levels. 8. Aortic Atherosclerosis (ICD10-I70.0).   02/24/2021 Surgery   LAPAROTOMY EXPLORATORY resection of sigmoid colon and proximal rectum, takedown of colovesical fistula, creation of end ileosotmy by Dr Clovis Riley    Final Pathologic Diagnosis      A.  SIGMOID COLON AND RECTUM, RESECTION:               Invasive adenocarcinoma with mucinous features, moderately differentiated.               Tumor measures 3.2 cm in greatest dimension. Tumor invades visceral peritoneum.  Lymphovascular invasion is present.               Metastatic adenocarcinoma involving seven (of 22) lymph nodes (7/22).  One margin involved by adenocarcinoma (grossly presumed distal margin).     03/25/2021 Imaging   IMPRESSION: 1. New bilateral pulmonary nodules worrisome for new pulmonary metastatic disease. 2. New small right pleural effusion with overlying atelectasis. 3. Recurrent pleural nodularity on the right. 4. No findings for abdominal/pelvic metastatic disease. 5. Stable severe atrophy of the pancreatic body and tail with associated main pancreatic duct dilatation. No obvious obstructing pancreatic lesion but this was not present on the prior CT scan from 04/03/2020. It may be due to a stricture or a small ductal lesion, not well seen. MRI abdomen without and with contrast may be helpful for further evaluation of this finding. 6. Stable surgical changes involving the colon with a Hartmann's pouch and left lower quadrant colostomy. 7. Stable enlarged prostate gland. 8. Stable thoracic and  lumbar compression fractures.      CURRENT THERAPY:  Second line chemo FOLFIRI and bevacizumab q2weeks starting 09/18/20. Chemo on hold since 12/23/20 due to fistula. RESTART with low dose irinotecan alone on 04/03/21.  INTERVAL HISTORY:  Nathaniel Norton is here for a follow up of metastatic colon cancer. He was last seen by me on 04/30/21 and by NP Lacie on 05/14/21 in the interim. He presents to the clinic alone. He reports continued nausea and vomiting at night, sometimes in the morning. He reports yesterday was the "best day I've had in 8 months!" He reports he still sometimes goes to the bathroom but has no control over it.   All other systems were reviewed with the patient and are negative.  MEDICAL HISTORY:  Past Medical History:  Diagnosis Date   colon ca dx'd 03/2019    SURGICAL HISTORY: Past Surgical History:  Procedure Laterality Date   CATARACT EXTRACTION     IR IMAGING GUIDED PORT INSERTION  05/10/2019   L4 fracture  2012   RETINAL DETACHMENT SURGERY     Right hand surgery  1974    I have reviewed the social history and family history with the patient and they  are unchanged from previous note.  ALLERGIES:  is allergic to feraheme [ferumoxytol] and codeine.  MEDICATIONS:  Current Outpatient Medications  Medication Sig Dispense Refill   diphenoxylate-atropine (LOMOTIL) 2.5-0.025 MG tablet Take 1-2 tablets by mouth 4 (four) times daily as needed for diarrhea or loose stools. 90 tablet 1   fluticasone (FLONASE) 50 MCG/ACT nasal spray Place into the nose.     lidocaine-prilocaine (EMLA) cream Apply 1 application topically as needed. 30 g 1   Multiple Vitamin (MULTIVITAMIN) tablet Take 1 tablet by mouth daily.     MULTIPLE VITAMIN PO      ondansetron (ZOFRAN) 8 MG tablet Take 1 tablet (8 mg total) by mouth every 8 (eight) hours as needed for nausea or vomiting. Start on day 3 after chemotherapy 20 tablet 0   ondansetron (ZOFRAN-ODT) 4 MG disintegrating tablet Take 4 mg by  mouth every 8 (eight) hours as needed.     oxyCODONE (OXY IR/ROXICODONE) 5 MG immediate release tablet Take 1 tablet (5 mg total) by mouth every 6 (six) hours as needed for severe pain. 90 tablet 0   prochlorperazine (COMPAZINE) 10 MG tablet Take 1 tablet (10 mg total) by mouth every 6 (six) hours as needed (Nausea or vomiting). 30 tablet 2   sodium chloride flush 0.9 % SOLN injection      No current facility-administered medications for this visit.   Facility-Administered Medications Ordered in Other Visits  Medication Dose Route Frequency Provider Last Rate Last Admin   atropine injection 0.5 mg  0.5 mg Intravenous Once PRN Truitt Merle, MD       fluorouracil (ADRUCIL) 3,250 mg in sodium chloride 0.9 % 85 mL chemo infusion  2,000 mg/m2 (Treatment Plan Recorded) Intravenous 1 day or 1 dose Truitt Merle, MD       irinotecan (CAMPTOSAR) 160 mg in sodium chloride 0.9 % 500 mL chemo infusion  100 mg/m2 (Treatment Plan Recorded) Intravenous Once Truitt Merle, MD 339 mL/hr at 05/28/21 1134 160 mg at 05/28/21 1134   leucovorin 652 mg in sodium chloride 0.9 % 250 mL infusion  400 mg/m2 (Treatment Plan Recorded) Intravenous Once Truitt Merle, MD 188 mL/hr at 05/28/21 1136 652 mg at 05/28/21 1136    PHYSICAL EXAMINATION: ECOG PERFORMANCE STATUS: 1 - Symptomatic but completely ambulatory  Vitals:   05/28/21 0951  BP: 100/70  Pulse: 75  Resp: 18  Temp: 97.6 F (36.4 C)  SpO2: 99%   Filed Weights   05/28/21 0951  Weight: 123 lb 14.4 oz (56.2 kg)    GENERAL:alert, no distress and comfortable SKIN: skin color, texture, turgor are normal, no rashes or significant lesions EYES: normal, Conjunctiva are pink and non-injected, sclera clear  NECK: supple, thyroid normal size, non-tender, without nodularity LYMPH:  no palpable lymphadenopathy in the cervical, axillary  LUNGS: clear to auscultation and percussion with normal breathing effort HEART: regular rate & rhythm and no murmurs and no lower extremity  edema ABDOMEN:abdomen soft, non-tender and normal bowel sounds Musculoskeletal:no cyanosis of digits and no clubbing  NEURO: alert & oriented x 3 with fluent speech, no focal motor/sensory deficits  LABORATORY DATA:  I have reviewed the data as listed CBC Latest Ref Rng & Units 05/28/2021 05/14/2021 04/30/2021  WBC 4.0 - 10.5 K/uL 6.4 8.2 21.4(H)  Hemoglobin 13.0 - 17.0 g/dL 10.2(L) 10.4(L) 11.3(L)  Hematocrit 39.0 - 52.0 % 30.1(L) 31.0(L) 34.4(L)  Platelets 150 - 400 K/uL 258 365 400     CMP Latest Ref Rng & Units 05/28/2021 05/14/2021 04/30/2021  Glucose 70 - 99 mg/dL 99 95 132(H)  BUN 8 - 23 mg/dL _0 Creatinine 0.61 - 1.24 mg/dL 0.78 0.67 0.68  Sodium 135 - 145 mmol/L 142 142 141  Potassium 3.5 - 5.1 mmol/L 3.6 3.6 3.9  Chloride 98 - 111 mmol/L 107 106 102  CO2 22 - 32 mmol/L _1 Calcium 8.9 - 10.3 mg/dL 9.0 9.0 9.0  Total Protein 6.5 - 8.1 g/dL 6.5 6.2(L) 6.3(L)  Total Bilirubin 0.3 - 1.2 mg/dL 1.3(H) 0.5 0.5  Alkaline Phos 38 - 126 U/L 69 69 110  AST 15 - 41 U/L _2 ALT 0 - 44 U/L _3 RADIOGRAPHIC STUDIES: I have personally reviewed the radiological images as listed and agreed with the findings in the report. No results found.   ASSESSMENT & PLAN:  Crayton Savarese is a 70 y.o. male with   1. Cancer of right colon, with peritoneal and lung metastasis, stage IV,  MMR normal, KRAS mutation (+), MSS -He was diagnosed in 03/2019. His Colonoscopy biopsy showed adenocarcinoma of right hepatic flexure colon. His peritoneal biopsy from 04/27/19 shows adenocarcinoma, consistent with metastasis of his colon cancer. This is now stage IV disease. -He was treated with first-line FOLFOX in June-November 2020 before proceeding with HIPEC surgery by Dr. Clovis Riley on 12/17/19.  -Unfortunately he had disease progression on 09/10/20 PET with hypermetabolic pleural (right) and peritoneal nodules, which are consistent with metastatic disease. A biopsy was not felt to be  necessary given his previously known peritoneal metastasis -He started second line FOLFIRI and bevacizumab q2weeks on 09/18/20, tolerated first 2 cycles poorly with abdominal pain/cramping and diarrhea.  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 Clovis Riley on 02/24/2021.   -New baseline CT 03/25/2021 showed new bilateral pulmonary small nodules, worrisome for new pulmonary metastatic disease.  No abdominal or pelvic metastatic disease -Resumed low-dose single agent irinotecan on 04/03/2021, added low-dose 5-FU back on 05/14/21.  He overall tolerated well. -He is clinically stable, has recovered well from chemotherapy.  Lab reviewed, adequate for treatment, will proceed low-dose FOLFIRI today and continue every 2 weeks -Plan to repeat scan in late July, I will order on next visit   2. Symptom management: Nausea, vomiting, weight loss, incontinence -secondary to chemotherapy, experiences nausea with vomiting in first day or two after treatment -previously met with nutrition in 10/2020 and more recently in 04/2021 -I recommend he be more proactive and take the compazine prior to treatment and for three days following. I offered to give him compazine today as part of his premeds. -For his incontinence, I recommended pelvic floor therapy. He would like to defer this for now.   3. 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 Watt Climes showed internal hemorrhoids, benign polyps, diverticulosis and colon cancer. -He was treated with oral iron, IV Feraheme on 05/03/19 and 05/16/19 (had reaction with 2nd dose).  -With cancer recurrence his  anemia recurred. Hgb has been going down since he restarted chemotherapy in 03/2021, down to 10.2 today (05/28/21).   4. Goal of care discussion -The patient understands the goal  of care is palliative. -I previously recommended DNR, he will think about it      PLAN:  -Labs reviewed and adequate to proceed with same low dose FOLFIRI today -Lab, flush, f/u and FOLFIRI in 2 and 4 weeks -We will order restaging CT scan on next visit    No problem-specific Assessment & Plan notes found for this encounter.   No orders of the defined types were placed in this encounter.  All questions were answered. The patient knows to call the clinic with any problems, questions or concerns. No barriers to learning was detected. The total time spent in the appointment was 30 minutes.     Truitt Merle, MD 05/28/2021   I, Wilburn Mylar, am acting as scribe for Truitt Merle, MD.   I have reviewed the above documentation for accuracy and completeness, and I agree with the above.

## 2021-05-28 ENCOUNTER — Inpatient Hospital Stay (HOSPITAL_BASED_OUTPATIENT_CLINIC_OR_DEPARTMENT_OTHER): Payer: Medicare Other | Admitting: Hematology

## 2021-05-28 ENCOUNTER — Inpatient Hospital Stay: Payer: Medicare Other

## 2021-05-28 ENCOUNTER — Other Ambulatory Visit: Payer: Self-pay

## 2021-05-28 ENCOUNTER — Encounter: Payer: Self-pay | Admitting: Hematology

## 2021-05-28 VITALS — BP 100/70 | HR 75 | Temp 97.6°F | Resp 18 | Ht 66.0 in | Wt 123.9 lb

## 2021-05-28 DIAGNOSIS — D500 Iron deficiency anemia secondary to blood loss (chronic): Secondary | ICD-10-CM

## 2021-05-28 DIAGNOSIS — Z95828 Presence of other vascular implants and grafts: Secondary | ICD-10-CM

## 2021-05-28 DIAGNOSIS — Z933 Colostomy status: Secondary | ICD-10-CM | POA: Diagnosis not present

## 2021-05-28 DIAGNOSIS — C182 Malignant neoplasm of ascending colon: Secondary | ICD-10-CM

## 2021-05-28 DIAGNOSIS — Z5189 Encounter for other specified aftercare: Secondary | ICD-10-CM | POA: Diagnosis not present

## 2021-05-28 DIAGNOSIS — Z452 Encounter for adjustment and management of vascular access device: Secondary | ICD-10-CM | POA: Diagnosis not present

## 2021-05-28 DIAGNOSIS — Z9221 Personal history of antineoplastic chemotherapy: Secondary | ICD-10-CM | POA: Diagnosis not present

## 2021-05-28 DIAGNOSIS — Z5111 Encounter for antineoplastic chemotherapy: Secondary | ICD-10-CM | POA: Diagnosis not present

## 2021-05-28 DIAGNOSIS — C786 Secondary malignant neoplasm of retroperitoneum and peritoneum: Secondary | ICD-10-CM | POA: Diagnosis not present

## 2021-05-28 DIAGNOSIS — N200 Calculus of kidney: Secondary | ICD-10-CM | POA: Diagnosis not present

## 2021-05-28 LAB — CMP (CANCER CENTER ONLY)
ALT: 28 U/L (ref 0–44)
AST: 24 U/L (ref 15–41)
Albumin: 3.6 g/dL (ref 3.5–5.0)
Alkaline Phosphatase: 69 U/L (ref 38–126)
Anion gap: 6 (ref 5–15)
BUN: 16 mg/dL (ref 8–23)
CO2: 29 mmol/L (ref 22–32)
Calcium: 9 mg/dL (ref 8.9–10.3)
Chloride: 107 mmol/L (ref 98–111)
Creatinine: 0.78 mg/dL (ref 0.61–1.24)
GFR, Estimated: >60 mL/min (ref >60)
Glucose, Bld: 99 mg/dL (ref 70–99)
Potassium: 3.6 mmol/L (ref 3.5–5.1)
Sodium: 142 mmol/L (ref 135–145)
Total Bilirubin: 1.3 mg/dL — ABNORMAL HIGH (ref 0.3–1.2)
Total Protein: 6.5 g/dL (ref 6.5–8.1)

## 2021-05-28 LAB — FERRITIN: Ferritin: 173 ng/mL (ref 24–336)

## 2021-05-28 LAB — CBC WITH DIFFERENTIAL (CANCER CENTER ONLY)
Abs Immature Granulocytes: 0.01 10*3/uL (ref 0.00–0.07)
Basophils Absolute: 0 10*3/uL (ref 0.0–0.1)
Basophils Relative: 1 %
Eosinophils Absolute: 0.1 10*3/uL (ref 0.0–0.5)
Eosinophils Relative: 1 %
HCT: 30.1 % — ABNORMAL LOW (ref 39.0–52.0)
Hemoglobin: 10.2 g/dL — ABNORMAL LOW (ref 13.0–17.0)
Immature Granulocytes: 0 %
Lymphocytes Relative: 34 %
Lymphs Abs: 2.1 10*3/uL (ref 0.7–4.0)
MCH: 28.3 pg (ref 26.0–34.0)
MCHC: 33.9 g/dL (ref 30.0–36.0)
MCV: 83.6 fL (ref 80.0–100.0)
Monocytes Absolute: 0.7 10*3/uL (ref 0.1–1.0)
Monocytes Relative: 10 %
Neutro Abs: 3.5 10*3/uL (ref 1.7–7.7)
Neutrophils Relative %: 54 %
Platelet Count: 258 10*3/uL (ref 150–400)
RBC: 3.6 MIL/uL — ABNORMAL LOW (ref 4.22–5.81)
RDW: 17.4 % — ABNORMAL HIGH (ref 11.5–15.5)
WBC Count: 6.4 10*3/uL (ref 4.0–10.5)
nRBC: 0 % (ref 0.0–0.2)

## 2021-05-28 LAB — CEA (IN HOUSE-CHCC): CEA (CHCC-In House): 3.63 ng/mL (ref 0.00–5.00)

## 2021-05-28 MED ORDER — ATROPINE SULFATE 1 MG/ML IJ SOLN
0.5000 mg | Freq: Once | INTRAMUSCULAR | Status: AC | PRN
  Administered 2021-05-28: 0.5 mg via INTRAVENOUS

## 2021-05-28 MED ORDER — PALONOSETRON HCL INJECTION 0.25 MG/5ML
0.2500 mg | Freq: Once | INTRAVENOUS | Status: AC
Start: 2021-05-28 — End: 2021-05-28
  Administered 2021-05-28: 0.25 mg via INTRAVENOUS

## 2021-05-28 MED ORDER — DEXAMETHASONE SODIUM PHOSPHATE 100 MG/10ML IJ SOLN
10.0000 mg | Freq: Once | INTRAMUSCULAR | Status: AC
  Administered 2021-05-28: 10 mg via INTRAVENOUS
  Filled 2021-05-28: qty 10

## 2021-05-28 MED ORDER — SODIUM CHLORIDE 0.9 % IV SOLN
Freq: Once | INTRAVENOUS | Status: AC
  Filled 2021-05-28: qty 250

## 2021-05-28 MED ORDER — SODIUM CHLORIDE 0.9% FLUSH
10.0000 mL | INTRAVENOUS | Status: DC | PRN
  Administered 2021-05-28: 10 mL
  Filled 2021-05-28: qty 10

## 2021-05-28 MED ORDER — SODIUM CHLORIDE 0.9 % IV SOLN
2000.0000 mg/m2 | INTRAVENOUS | Status: DC
  Administered 2021-05-28: 3250 mg via INTRAVENOUS
  Filled 2021-05-28: qty 65

## 2021-05-28 MED ORDER — SODIUM CHLORIDE 0.9 % IV SOLN
100.0000 mg/m2 | Freq: Once | INTRAVENOUS | Status: AC
  Administered 2021-05-28: 160 mg via INTRAVENOUS
  Filled 2021-05-28: qty 8

## 2021-05-28 MED ORDER — PROCHLORPERAZINE MALEATE 10 MG PO TABS
10.0000 mg | ORAL_TABLET | Freq: Once | ORAL | Status: AC
  Administered 2021-05-28: 10 mg via ORAL

## 2021-05-28 MED ORDER — PROCHLORPERAZINE MALEATE 10 MG PO TABS
ORAL_TABLET | ORAL | Status: AC
  Filled 2021-05-28: qty 1

## 2021-05-28 MED ORDER — SODIUM CHLORIDE 0.9 % IV SOLN
400.0000 mg/m2 | Freq: Once | INTRAVENOUS | Status: AC
  Administered 2021-05-28: 652 mg via INTRAVENOUS
  Filled 2021-05-28: qty 32.6

## 2021-05-28 MED ORDER — ATROPINE SULFATE 1 MG/ML IJ SOLN
INTRAMUSCULAR | Status: AC
  Filled 2021-05-28: qty 1

## 2021-05-28 MED ORDER — PALONOSETRON HCL INJECTION 0.25 MG/5ML
INTRAVENOUS | Status: AC
  Filled 2021-05-28: qty 5

## 2021-05-28 NOTE — Patient Instructions (Signed)
[facility]  Discharge Instructions: Thank you for choosing [facility] to provide your oncology and hematology care.   If you have a lab appointment with the [facility], please go directly to the [facility] and check in at the registration area.   Wear comfortable clothing and clothing appropriate for easy access to any Portacath or PICC line.   We strive to give you quality time with your provider. You may need to reschedule your appointment if you arrive late (15 or more minutes).  Arriving late affects you and other patients whose appointments are after yours.  Also, if you miss three or more appointments without notifying the office, you may be dismissed from the clinic at the provider's discretion.      For prescription refill requests, have your pharmacy contact our office and allow 72 hours for refills to be completed.    Today you received the following chemotherapy and/or immunotherapy agents : Irinotecan, Leucovorin, 5FU    To help prevent nausea and vomiting after your treatment, we encourage you to take your nausea medication as directed.  BELOW ARE SYMPTOMS THAT SHOULD BE REPORTED IMMEDIATELY: *FEVER GREATER THAN 100.4 F (38 C) OR HIGHER *CHILLS OR SWEATING *NAUSEA AND VOMITING THAT IS NOT CONTROLLED WITH YOUR NAUSEA MEDICATION *UNUSUAL SHORTNESS OF BREATH *UNUSUAL BRUISING OR BLEEDING *URINARY PROBLEMS (pain or burning when urinating, or frequent urination) *BOWEL PROBLEMS (unusual diarrhea, constipation, pain near the anus) TENDERNESS IN MOUTH AND THROAT WITH OR WITHOUT PRESENCE OF ULCERS (sore throat, sores in mouth, or a toothache) UNUSUAL RASH, SWELLING OR PAIN  UNUSUAL VAGINAL DISCHARGE OR ITCHING   Items with * indicate a potential emergency and should be followed up as soon as possible or go to the Emergency Department if any problems should occur.  Please show the CHEMOTHERAPY ALERT CARD or IMMUNOTHERAPY ALERT  CARD at check-in to the Emergency Department and triage nurse.  Should you have questions after your visit or need to cancel or reschedule your appointment, please contact [facility]  Dept: [phone]  and follow the prompts.  Office hours are 8:00 a.m. to 4:30 p.m. Monday - Friday. Please note that voicemails left after 4:00 p.m. may not be returned until the following business day.  We are closed weekends and major holidays. You have access to a nurse at all times for urgent questions. Please call the main number to the clinic Dept: [phone] and follow the prompts.   For any non-urgent questions, you may also contact your provider using MyChart. We now offer e-Visits for anyone 8 and older to request care online for non-urgent symptoms. For details visit [email or web address].   Also download the MyChart app! Go to the app store, search "MyChart", open the app, select [facility], and log in with your MyChart username and password.  Due to Covid, a mask is required upon entering the hospital/clinic. If you do not have a mask, one will be given to you upon arrival. For doctor visits, patients may have 1 support person aged 26 or older with them. For treatment visits, patients cannot have anyone with them due to current Covid guidelines and our immunocompromised population.

## 2021-05-30 ENCOUNTER — Inpatient Hospital Stay: Payer: Medicare Other

## 2021-05-30 ENCOUNTER — Other Ambulatory Visit: Payer: Self-pay

## 2021-05-30 VITALS — BP 117/67 | HR 88 | Temp 97.6°F | Resp 20

## 2021-05-30 DIAGNOSIS — Z5189 Encounter for other specified aftercare: Secondary | ICD-10-CM | POA: Diagnosis not present

## 2021-05-30 DIAGNOSIS — Z933 Colostomy status: Secondary | ICD-10-CM | POA: Diagnosis not present

## 2021-05-30 DIAGNOSIS — Z452 Encounter for adjustment and management of vascular access device: Secondary | ICD-10-CM | POA: Diagnosis not present

## 2021-05-30 DIAGNOSIS — D500 Iron deficiency anemia secondary to blood loss (chronic): Secondary | ICD-10-CM | POA: Diagnosis not present

## 2021-05-30 DIAGNOSIS — N200 Calculus of kidney: Secondary | ICD-10-CM | POA: Diagnosis not present

## 2021-05-30 DIAGNOSIS — Z9221 Personal history of antineoplastic chemotherapy: Secondary | ICD-10-CM | POA: Diagnosis not present

## 2021-05-30 DIAGNOSIS — C182 Malignant neoplasm of ascending colon: Secondary | ICD-10-CM

## 2021-05-30 DIAGNOSIS — Z5111 Encounter for antineoplastic chemotherapy: Secondary | ICD-10-CM | POA: Diagnosis not present

## 2021-05-30 DIAGNOSIS — C786 Secondary malignant neoplasm of retroperitoneum and peritoneum: Secondary | ICD-10-CM | POA: Diagnosis not present

## 2021-05-30 MED ORDER — SODIUM CHLORIDE 0.9% FLUSH
10.0000 mL | INTRAVENOUS | Status: DC | PRN
  Administered 2021-05-30: 10 mL
  Filled 2021-05-30: qty 10

## 2021-05-30 MED ORDER — PEGFILGRASTIM-BMEZ 6 MG/0.6ML ~~LOC~~ SOSY
6.0000 mg | PREFILLED_SYRINGE | Freq: Once | SUBCUTANEOUS | Status: AC
  Administered 2021-05-30: 6 mg via SUBCUTANEOUS

## 2021-05-30 MED ORDER — HEPARIN SOD (PORK) LOCK FLUSH 100 UNIT/ML IV SOLN
500.0000 [IU] | Freq: Once | INTRAVENOUS | Status: AC | PRN
  Administered 2021-05-30: 500 [IU]
  Filled 2021-05-30: qty 5

## 2021-05-30 NOTE — Addendum Note (Signed)
Addended by: Junell Cullifer L on: 05/30/2021 11:48 AM   Modules accepted: Orders

## 2021-06-11 ENCOUNTER — Inpatient Hospital Stay: Payer: Medicare Other

## 2021-06-11 ENCOUNTER — Inpatient Hospital Stay (HOSPITAL_BASED_OUTPATIENT_CLINIC_OR_DEPARTMENT_OTHER): Payer: Medicare Other | Admitting: Hematology

## 2021-06-11 ENCOUNTER — Encounter: Payer: Self-pay | Admitting: Hematology

## 2021-06-11 ENCOUNTER — Other Ambulatory Visit: Payer: Self-pay

## 2021-06-11 VITALS — BP 133/81 | HR 83 | Temp 98.3°F | Resp 18 | Ht 66.0 in | Wt 125.0 lb

## 2021-06-11 DIAGNOSIS — C182 Malignant neoplasm of ascending colon: Secondary | ICD-10-CM

## 2021-06-11 DIAGNOSIS — Z5111 Encounter for antineoplastic chemotherapy: Secondary | ICD-10-CM | POA: Diagnosis not present

## 2021-06-11 DIAGNOSIS — N200 Calculus of kidney: Secondary | ICD-10-CM | POA: Diagnosis not present

## 2021-06-11 DIAGNOSIS — Z85038 Personal history of other malignant neoplasm of large intestine: Secondary | ICD-10-CM | POA: Diagnosis not present

## 2021-06-11 DIAGNOSIS — D500 Iron deficiency anemia secondary to blood loss (chronic): Secondary | ICD-10-CM | POA: Diagnosis not present

## 2021-06-11 DIAGNOSIS — C786 Secondary malignant neoplasm of retroperitoneum and peritoneum: Secondary | ICD-10-CM | POA: Diagnosis not present

## 2021-06-11 DIAGNOSIS — Z452 Encounter for adjustment and management of vascular access device: Secondary | ICD-10-CM | POA: Diagnosis not present

## 2021-06-11 DIAGNOSIS — Z9221 Personal history of antineoplastic chemotherapy: Secondary | ICD-10-CM | POA: Diagnosis not present

## 2021-06-11 DIAGNOSIS — Z5189 Encounter for other specified aftercare: Secondary | ICD-10-CM | POA: Diagnosis not present

## 2021-06-11 DIAGNOSIS — Z933 Colostomy status: Secondary | ICD-10-CM | POA: Diagnosis not present

## 2021-06-11 LAB — CMP (CANCER CENTER ONLY)
ALT: 13 U/L (ref 0–44)
AST: 14 U/L — ABNORMAL LOW (ref 15–41)
Albumin: 3.5 g/dL (ref 3.5–5.0)
Alkaline Phosphatase: 114 U/L (ref 38–126)
Anion gap: 7 (ref 5–15)
BUN: 9 mg/dL (ref 8–23)
CO2: 28 mmol/L (ref 22–32)
Calcium: 9 mg/dL (ref 8.9–10.3)
Chloride: 105 mmol/L (ref 98–111)
Creatinine: 0.68 mg/dL (ref 0.61–1.24)
GFR, Estimated: >60 mL/min (ref >60)
Glucose, Bld: 105 mg/dL — ABNORMAL HIGH (ref 70–99)
Potassium: 3.9 mmol/L (ref 3.5–5.1)
Sodium: 140 mmol/L (ref 135–145)
Total Bilirubin: 0.8 mg/dL (ref 0.3–1.2)
Total Protein: 6.6 g/dL (ref 6.5–8.1)

## 2021-06-11 LAB — CBC WITH DIFFERENTIAL (CANCER CENTER ONLY)
Abs Immature Granulocytes: 0.13 10*3/uL — ABNORMAL HIGH (ref 0.00–0.07)
Basophils Absolute: 0.1 10*3/uL (ref 0.0–0.1)
Basophils Relative: 0 %
Eosinophils Absolute: 0.1 10*3/uL (ref 0.0–0.5)
Eosinophils Relative: 1 %
HCT: 32.6 % — ABNORMAL LOW (ref 39.0–52.0)
Hemoglobin: 11 g/dL — ABNORMAL LOW (ref 13.0–17.0)
Immature Granulocytes: 1 %
Lymphocytes Relative: 15 %
Lymphs Abs: 2.5 10*3/uL (ref 0.7–4.0)
MCH: 28.6 pg (ref 26.0–34.0)
MCHC: 33.7 g/dL (ref 30.0–36.0)
MCV: 84.7 fL (ref 80.0–100.0)
Monocytes Absolute: 1.3 10*3/uL — ABNORMAL HIGH (ref 0.1–1.0)
Monocytes Relative: 8 %
Neutro Abs: 13 10*3/uL — ABNORMAL HIGH (ref 1.7–7.7)
Neutrophils Relative %: 75 %
Platelet Count: 313 10*3/uL (ref 150–400)
RBC: 3.85 MIL/uL — ABNORMAL LOW (ref 4.22–5.81)
RDW: 19.9 % — ABNORMAL HIGH (ref 11.5–15.5)
WBC Count: 17.1 10*3/uL — ABNORMAL HIGH (ref 4.0–10.5)
nRBC: 0 % (ref 0.0–0.2)

## 2021-06-11 MED ORDER — PALONOSETRON HCL INJECTION 0.25 MG/5ML
0.2500 mg | Freq: Once | INTRAVENOUS | Status: AC
  Administered 2021-06-11: 0.25 mg via INTRAVENOUS

## 2021-06-11 MED ORDER — SODIUM CHLORIDE 0.9 % IV SOLN
Freq: Once | INTRAVENOUS | Status: AC
  Filled 2021-06-11: qty 250

## 2021-06-11 MED ORDER — ATROPINE SULFATE 1 MG/ML IJ SOLN
INTRAMUSCULAR | Status: AC
  Filled 2021-06-11: qty 1

## 2021-06-11 MED ORDER — HEPARIN SOD (PORK) LOCK FLUSH 100 UNIT/ML IV SOLN
500.0000 [IU] | Freq: Once | INTRAVENOUS | Status: DC | PRN
  Filled 2021-06-11: qty 5

## 2021-06-11 MED ORDER — PALONOSETRON HCL INJECTION 0.25 MG/5ML
INTRAVENOUS | Status: AC
  Filled 2021-06-11: qty 5

## 2021-06-11 MED ORDER — SODIUM CHLORIDE 0.9 % IV SOLN
10.0000 mg | Freq: Once | INTRAVENOUS | Status: AC
  Administered 2021-06-11: 10 mg via INTRAVENOUS
  Filled 2021-06-11: qty 10

## 2021-06-11 MED ORDER — SODIUM CHLORIDE 0.9 % IV SOLN
2000.0000 mg/m2 | INTRAVENOUS | Status: DC
  Administered 2021-06-11: 3250 mg via INTRAVENOUS
  Filled 2021-06-11: qty 65

## 2021-06-11 MED ORDER — ATROPINE SULFATE 1 MG/ML IJ SOLN
0.5000 mg | Freq: Once | INTRAMUSCULAR | Status: AC | PRN
  Administered 2021-06-11: 0.5 mg via INTRAVENOUS

## 2021-06-11 MED ORDER — SODIUM CHLORIDE 0.9% FLUSH
10.0000 mL | INTRAVENOUS | Status: DC | PRN
  Filled 2021-06-11: qty 10

## 2021-06-11 MED ORDER — SODIUM CHLORIDE 0.9 % IV SOLN
400.0000 mg/m2 | Freq: Once | INTRAVENOUS | Status: AC
  Administered 2021-06-11: 652 mg via INTRAVENOUS
  Filled 2021-06-11: qty 32.6

## 2021-06-11 MED ORDER — SODIUM CHLORIDE 0.9 % IV SOLN
100.0000 mg/m2 | Freq: Once | INTRAVENOUS | Status: AC
  Administered 2021-06-11: 160 mg via INTRAVENOUS
  Filled 2021-06-11: qty 8

## 2021-06-11 NOTE — Patient Instructions (Signed)
[facility] ON[facility]  Discharge Instructions: Thank you for choosing [facility] to provide your oncology and hematology care.   If you have a lab appointment with the [facility], please go directly to the [facility] and check in at the registration area.   Wear comfortable clothing and clothing appropriate for easy access to any Portacath or PICC line.   We strive to give you quality time with your provider. You may need to reschedule your appointment if you arrive late (15 or more minutes).  Arriving late affects you and other patients whose appointments are after yours.  Also, if you miss three or more appointments without notifying the office, you may be dismissed from the clinic at the provider's discretion.      For prescription refill requests, have your pharmacy contact our office and allow 72 hours for refills to be completed.    Today you received the following chemotherapy and/or immunotherapy agents : Irinotecan, Leucovorin, 5FU    To help prevent nausea and vomiting after your treatment, we encourage you to take your nausea medication as directed.  BELOW ARE SYMPTOMS THAT SHOULD BE REPORTED IMMEDIATELY: *FEVER GREATER THAN 100.4 F (38 C) OR HIGHER *CHILLS OR SWEATING *NAUSEA AND VOMITING THAT IS NOT CONTROLLED WITH YOUR NAUSEA MEDICATION *UNUSUAL SHORTNESS OF BREATH *UNUSUAL BRUISING OR BLEEDING *URINARY PROBLEMS (pain or burning when urinating, or frequent urination) *BOWEL PROBLEMS (unusual diarrhea, constipation, pain near the anus) TENDERNESS IN MOUTH AND THROAT WITH OR WITHOUT PRESENCE OF ULCERS (sore throat, sores in mouth, or a toothache) UNUSUAL RASH, SWELLING OR PAIN  UNUSUAL VAGINAL DISCHARGE OR ITCHING   Items with * indicate a potential emergency and should be followed up as soon as possible or go to the Emergency Department if any problems should occur.  Please show the CHEMOTHERAPY ALERT CARD or IMMUNOTHERAPY ALERT  CARD at check-in to the Emergency Department and triage nurse.  Should you have questions after your visit or need to cancel or reschedule your appointment, please contact [facility]  Dept: [phone]  and follow the prompts.  Office hours are 8:00 a.m. to 4:30 p.m. Monday - Friday. Please note that voicemails left after 4:00 p.m. may not be returned until the following business day.  We are closed weekends and major holidays. You have access to a nurse at all times for urgent questions. Please call the main number to the clinic Dept: [phone] and follow the prompts.   For any non-urgent questions, you may also contact your provider using MyChart. We now offer e-Visits for anyone 42 and older to request care online for non-urgent symptoms. For details visit [email or web address].   Also download the MyChart app! Go to the app store, search "MyChart", open the app, select [facility], and log in with your MyChart username and password.  Due to Covid, a mask is required upon entering the hospital/clinic. If you do not have a mask, one will be given to you upon arrival. For doctor visits, patients may have 1 support person aged 54 or older with them. For treatment visits, patients cannot have anyone with them due to current Covid guidelines and our immunocompromised population.

## 2021-06-11 NOTE — Progress Notes (Signed)
[facility]   Telephone:([phone] Fax:([phone]   Clinic Follow up Note   Patient Care Team: Lajean Manes, MD as PCP - General (Internal Medicine) Berle Mull, MD as Consulting Physician (Family Medicine) Clarene Essex, MD as Consulting Physician (Gastroenterology) Truitt Merle, MD as Consulting Physician (Oncology)  Date of Service:  06/11/2021  CHIEF COMPLAINT: f/u of metastatic colon cancer  SUMMARY OF ONCOLOGIC HISTORY: Oncology History Overview Note  Cancer Staging Cancer of right colon [facility]) Staging form: Colon and Rectum, AJCC 8th Edition - Clinical stage from 04/09/2019: Stage IVC (cTX, cNX, pM1c) - Signed by Truitt Merle, MD on 05/03/2019      Cancer of right colon [facility])  04/09/2019 Procedure   Colonoscopy 04/09/19 by Dr Watt Climes IMPRESSION -internal hemorrhoids -Diverticulosis in the sigmoid colon  -2 small polyps in the rectum and in the proximal transverse colon, removed with a hot snare. Resected and retrieved.  -3 medium polyps in the proximal transverse colon, in the mid transverse colon and in the distal transverse colon, removed and resected and retrieved.  -likely malignant partially obstructing tumor at the hepatic flexure. biopsied, tattooed.  -1 large polyp in the mid ascending colon  -the examination was otherwise normal      04/09/2019 Initial Biopsy   FINAL MICROSCOPIC DIAGNOSIS: 04/09/19 1. LG intestine-hepatic flexure, Biopsy:   INVASIVE WELL DIFFERENTIATED ADENOCARCINOMA    04/09/2019 Cancer Staging   Staging form: Colon and Rectum, AJCC 8th Edition - Clinical stage from 04/09/2019: Stage IVC (cTX, cNX, pM1c) - Signed by Truitt Merle, MD on 05/03/2019    04/10/2019 Imaging   CT AP 04/10/19  IMPRESSION: 1. There is an eccentric mass of the colon involving the ascending colon near the hepatic flexure measuring approximately 3.4 x 3.4 by 2.0 cm (series 2, image 37, series 3, image 37). There is extensive soft tissue nodularity of the  mesocolon and omentum, and likely areas of the peritoneum, for example bilateral upper quadrants (series 2, image 25). Findings are consistent with primary colon malignancy, probable omental and peritoneal involvement, and small volume malignant ascites. 2.  Other chronic and incidental findings as detailed above.    04/20/2019 Initial Diagnosis   Cancer of right colon ([place])    04/26/2019 Imaging   CT Chest 04/26/19 IMPRESSION: 1. Borderline to mild lower thoracic adenopathy, including within the right internal mammary and juxta cardiophrenic stations. Given the appearance of the upper abdomen, suspicious for nodal metastasis. 2. A low right paratracheal node is borderline sized, but favored to be reactive. 3. No evidence of pulmonary metastasis. 4. Peritoneal metastasis and abdominal ascites, as before. 5. Pancreatic parenchymal calcifications indicative of chronic calcific pancreatitis. 6. Coronary artery atherosclerosis.    04/27/2019 Pathology Results   Diagnosis 04/27/19 Peritoneum, biopsy, right upper quadrant, perihepatic - ADENOCARCINOMA, CONSISTENT WITH COLONIC PRIMARY. - SEE COMMENT.    05/16/2019 - 10/31/2019 Chemotherapy   First line FOLFOX every 2 weeks with Avastin starting with cycle 2 starting 05/16/19. 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 Stopped after 10/31/19 for surgery.    07/23/2019 Imaging    CT AP W Contrast  IMPRESSION: 1. Primary ascending colon mass may be minimally smaller. Peritoneal metastatic disease appears slightly improved as well. 2. Chronic calcific pancreatitis. 3. Tiny left renal stone. 4. Small left inguinal hernia contains a knuckle of unobstructed colon. 5. Enlarged prostate.   10/29/2019  Imaging   CT AP W Contrast IMPRESSION: No significant change in small ascending  colon soft tissue mass and diffuse omental carcinomatosis.   No new or progressive metastatic disease identified.   Stable small left inguinal hernia containing a loop of sigmoid colon. No evidence of bowel obstruction or strangulation.   Colonic diverticulosis. No radiographic evidence of diverticulitis.   Stable enlarged prostate.   12/17/2019 Pathology Results   HIPEC Surgery by Dr Clovis Riley 12/17/19 FINAL PATHOLOGIC DIAGNOSIS  MICROSCOPIC EXAMINATION AND DIAGNOSIS  A.          "RIGHT COLON, OMENTUM, SPLEEN":       Invasive adenocarcinoma with mucinous features, moderately differentiated.  Tumor involves mesentery and spleen.  Five out of ten lymph nodes involved by adenocarcinoma with perinodal soft tissue involvement (5/10), see comment.  Margins are uninvolved by invasive carcinoma.  Ileum and appendix, uninvolved.  See comment and cancer case summary.  B.          "ROUND LIGAMENT OF LIVER":       Involved by adenocarcinoma with mucinous features, moderately differentiated.  C.          "RIGHT DIAPHRAM STRIPPING":       Negative for carcinoma.  D.          "RIGHT HEPATIC CAPSULECTOMY":       Chronic inflammation and fibrosis.  Negative for carcinoma.  E.          "DEBRIDED TUMOR":       Involved by adenocarcinoma with mucinous features, moderately differentiated.   COMMENT: Sections disclose five out of ten lymph nodes involved by adenocarcinoma with perinodal soft tissue involvement. However, it is not possible to be certain if this represents lymph nodes replaced by tumor and/or soft tissue involvement. In addition, focal perineural invasion is seen.   04/03/2020 Imaging   CT AP W contrast  IMPRESSION: 1. Status post interval splenectomy and right hemicolectomy. 2. Response to therapy of omental/peritoneal metastasis. The only residual indeterminate finding is fluid density along the capsule of the hepatic dome which could be postoperative or secondary  to localized residual peritoneal disease. 3. Left nephrolithiasis. 4.  Possible constipation. 5. Prostatomegaly. 6.  Aortic Atherosclerosis (ICD10-I70.0).   07/11/2020 Imaging   CT AP  1.  Postsurgical changes of subtle reductive surgery including right hemicolectomy, omentectomy, splenectomy, and right hepatic capsulectomy.  2.  Interval development of multiple areas of low attenuation involving the liver, as described above. This the intraparenchymal low-density lesion is concerning for metastatic disease. Other disease along the capsule may be post therapy related. Residual thickening of the anterior peritoneum particularly anterior to the right lobe of the liver is also concerning for some residual disease although appears significantly improved. Recommend attention on follow-up.  3.  No definite evidence of residual peritoneal disease status post HIPEC.   09/18/2020 -  Chemotherapy   Second line chemo FOLFIRI and bevacizumab q2weeks starting 09/18/20. Chemo on hold since 12/23/20 due to fistula. RESTART with low dose irinotecan alone on 04/03/21.   12/19/2020 Imaging   CT CAP  IMPRESSION: 1. Interval development of a 1.6 x 2.7 x 1.6 cm collection of gas and debris between the sigmoid colon and dome of bladder, compatible with small abscess. A portion of this abscess is probably intramural at the dome of bladder. Marked associated thickening of the adjacent colonic and bladder walls with tiny gas bubbles in the thickened bladder wall and prominent amount of free gas in the bladder lumen suggests an  associated colovesical fistula. Tiny gas bubbles are also seen in the central prostate gland and may be in the urethra although parenchymal gas within the prostate gland is not excluded on this exam. 2. Nodularity along the major and minor fissures of the right hemithorax has decreased in the interval, nearly imperceptible today. 3. Peritoneal implants along the liver, right abdomen, and deep  to the left paramidline anterior abdominal wall have resolved in the interval by CT imaging. 4. Tiny right lower lobe pulmonary nodule is more conspicuous today. Attention on follow-up recommended. 5. Stable 4 mm hypodensity in the dome of the right liver, too small to characterize. 6. Nonobstructing left renal stones with bilateral renal cysts. 7. Stable compression deformity at multiple thoracic and lumbar levels. 8. Aortic Atherosclerosis (ICD10-I70.0).   02/24/2021 Surgery   LAPAROTOMY EXPLORATORY resection of sigmoid colon and proximal rectum, takedown of colovesical fistula, creation of end ileosotmy by Dr Clovis Riley    Final Pathologic Diagnosis      A.  SIGMOID COLON AND RECTUM, RESECTION:               Invasive adenocarcinoma with mucinous features, moderately differentiated.               Tumor measures 3.2 cm in greatest dimension. Tumor invades visceral peritoneum.  Lymphovascular invasion is present.               Metastatic adenocarcinoma involving seven (of 22) lymph nodes (7/22).  One margin involved by adenocarcinoma (grossly presumed distal margin).     03/25/2021 Imaging   IMPRESSION: 1. New bilateral pulmonary nodules worrisome for new pulmonary metastatic disease. 2. New small right pleural effusion with overlying atelectasis. 3. Recurrent pleural nodularity on the right. 4. No findings for abdominal/pelvic metastatic disease. 5. Stable severe atrophy of the pancreatic body and tail with associated main pancreatic duct dilatation. No obvious obstructing pancreatic lesion but this was not present on the prior CT scan from 04/03/2020. It may be due to a stricture or a small ductal lesion, not well seen. MRI abdomen without and with contrast may be helpful for further evaluation of this finding. 6. Stable surgical changes involving the colon with a Hartmann's pouch and left lower quadrant colostomy. 7. Stable enlarged prostate gland. 8. Stable thoracic and  lumbar compression fractures.      CURRENT THERAPY:  Second line chemo FOLFIRI and bevacizumab q2weeks starting 09/18/20. Chemo on hold since 12/23/20 due to fistula. RESTART with low dose irinotecan alone on 04/03/21.  INTERVAL HISTORY:  Nathaniel Norton is here for a follow up of metastatic colon cancer. He was last seen by me on 05/28/21. He presents to the clinic alone. He denies any new issues since his last visit. He reports continued vomiting, and he vomited here at the [facility] at his last infusion. He endorses taking compazine prior to his visit today.  He notes he was able to work out in his yard when the weather was cooler.  All other systems were reviewed with the patient and are negative.  MEDICAL HISTORY:  Past Medical History:  Diagnosis Date   colon ca dx'd 03/2019    SURGICAL HISTORY: Past Surgical History:  Procedure Laterality Date   CATARACT EXTRACTION     IR IMAGING GUIDED PORT INSERTION  05/10/2019   L4 fracture  2012   RETINAL DETACHMENT SURGERY     Right hand surgery  1974    I have reviewed the social history and family history with the patient  and they are unchanged from previous note.  ALLERGIES:  is allergic to feraheme [ferumoxytol] and codeine.  MEDICATIONS:  Current Outpatient Medications  Medication Sig Dispense Refill   diphenoxylate-atropine (LOMOTIL) 2.5-0.025 MG tablet Take 1-2 tablets by mouth 4 (four) times daily as needed for diarrhea or loose stools. 90 tablet 1   fluticasone (FLONASE) 50 MCG/ACT nasal spray Place into the nose.     lidocaine-prilocaine (EMLA) cream Apply 1 application topically as needed. 30 g 1   Multiple Vitamin (MULTIVITAMIN) tablet Take 1 tablet by mouth daily.     MULTIPLE VITAMIN PO      ondansetron (ZOFRAN) 8 MG tablet Take 1 tablet (8 mg total) by mouth every 8 (eight) hours as needed for nausea or vomiting. Start on day 3 after chemotherapy 20 tablet 0   ondansetron (ZOFRAN-ODT) 4 MG disintegrating tablet  Take 4 mg by mouth every 8 (eight) hours as needed.     oxyCODONE (OXY IR/ROXICODONE) 5 MG immediate release tablet Take 1 tablet (5 mg total) by mouth every 6 (six) hours as needed for severe pain. 90 tablet 0   prochlorperazine (COMPAZINE) 10 MG tablet Take 1 tablet (10 mg total) by mouth every 6 (six) hours as needed (Nausea or vomiting). 30 tablet 2   sodium chloride flush 0.9 % SOLN injection      No current facility-administered medications for this visit.   Facility-Administered Medications Ordered in Other Visits  Medication Dose Route Frequency Provider Last Rate Last Admin   fluorouracil (ADRUCIL) 3,250 mg in sodium chloride 0.9 % 85 mL chemo infusion  2,000 mg/m2 (Treatment Plan Recorded) Intravenous 1 day or 1 dose Truitt Merle, MD   3,250 mg at 06/11/21 1402   heparin lock flush 100 unit/mL  500 Units Intracatheter Once PRN Truitt Merle, MD       sodium chloride flush (NS) 0.9 % injection 10 mL  10 mL Intracatheter PRN Truitt Merle, MD   10 mL at 05/30/21 1145   sodium chloride flush (NS) 0.9 % injection 10 mL  10 mL Intracatheter PRN Truitt Merle, MD        PHYSICAL EXAMINATION: ECOG PERFORMANCE STATUS: 1 - Symptomatic but completely ambulatory  Vitals:   06/11/21 1012  BP: 133/81  Pulse: 83  Resp: 18  Temp: 98.3 F (36.8 C)  SpO2: 98%   Filed Weights   06/11/21 1012  Weight: 125 lb (56.7 kg)    Due to COVID19 we will limit examination to appearance. Patient had no complaints.  GENERAL:alert, no distress and comfortable SKIN: skin color normal, no rashes or significant lesions EYES: normal, Conjunctiva are pink and non-injected, sclera clear  NEURO: alert & oriented x 3 with fluent speech  LABORATORY DATA:  I have reviewed the data as listed CBC Latest Ref Rng & Units 06/11/2021 05/28/2021 05/14/2021  WBC 4.0 - 10.5 K/uL 17.1(H) 6.4 8.2  Hemoglobin 13.0 - 17.0 g/dL 11.0(L) 10.2(L) 10.4(L)  Hematocrit 39.0 - 52.0 % 32.6(L) 30.1(L) 31.0(L)  Platelets 150 - 400 K/uL 313 258 365      CMP Latest Ref Rng & Units 06/11/2021 05/28/2021 05/14/2021  Glucose 70 - 99 mg/dL 105(H) 99 95  BUN 8 - 23 mg/dL '9 16 12  ' Creatinine 0.61 - 1.24 mg/dL 0.68 0.78 0.67  Sodium 135 - 145 mmol/L 140 142 142  Potassium 3.5 - 5.1 mmol/L 3.9 3.6 3.6  Chloride 98 - 111 mmol/L 105 107 106  CO2 22 - 32 mmol/L '28 29 27  ' Calcium 8.9 -  10.3 mg/dL 9.0 9.0 9.0  Total Protein 6.5 - 8.1 g/dL 6.6 6.5 6.2(L)  Total Bilirubin 0.3 - 1.2 mg/dL 0.8 1.3(H) 0.5  Alkaline Phos 38 - 126 U/L 114 69 69  AST 15 - 41 U/L 14(L) 24 17  ALT 0 - 44 U/L '13 28 11    ' RADIOGRAPHIC STUDIES: I have personally reviewed the radiological images as listed and agreed with the findings in the report. No results found.   ASSESSMENT & PLAN:  Nathaniel Norton is a 70 y.o. male with   1. Cancer of right colon, with peritoneal and lung metastasis, stage IV,  MMR normal, KRAS mutation (+), MSS -He was diagnosed in 03/2019. His Colonoscopy biopsy showed adenocarcinoma of right hepatic flexure colon. His peritoneal biopsy from 04/27/19 shows adenocarcinoma, consistent with metastasis of his colon cancer. This is now stage IV disease. -He was treated with first-line FOLFOX in June-November 2020 before proceeding with HIPEC surgery by Dr. Clovis Riley on 12/17/19.  -Unfortunately he had disease progression on 09/10/20 PET with hypermetabolic pleural (right) and peritoneal nodules, which are consistent with metastatic disease. A biopsy was not felt to be necessary given his previously known peritoneal metastasis -He started second line FOLFIRI and bevacizumab q2weeks on 09/18/20, tolerated first 2 cycles poorly with abdominal pain/cramping and diarrhea.  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 Clovis Riley on 02/24/21.   -New baseline CT 03/25/21 showed new bilateral pulmonary small nodules, worrisome for new pulmonary metastatic disease.  No abdominal or pelvic metastatic disease -Resumed low-dose single agent irinotecan on 04/03/2021, added low-dose 5-FU back on 05/14/21.  He overall tolerated well. -He is clinically stable. Lab reviewed, adequate for treatment, will proceed low-dose FOLFIRI today and continue every 2 weeks -Plan to repeat scan in late July, after his next cycle chemo (in about 3-4 weeks).   2. Symptom management: Nausea, vomiting, weight loss, incontinence -secondary to chemotherapy, experiences nausea with vomiting in first day or two after treatment -previously met with nutrition in 10/2020 and more recently in 04/2021 -His weight has gone down since diagnosis but has been stable restarting the irinotecan. -He is now taking the compazine prior to treatment and for three days following.  -For his incontinence, I previously recommended pelvic floor therapy. He would like to defer this for now.   3. 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 Watt Climes showed internal hemorrhoids, benign polyps, diverticulosis and colon cancer. -He was treated with oral iron, IV Feraheme on 05/03/19 and 05/16/19 (had reaction with 2nd dose).  -With cancer recurrence his anemia recurred. Hgb has been going down since he restarted chemotherapy in 03/2021, back up to 11 today (06/11/21).   4. Goal of care discussion -The patient understands the goal of care is palliative. -I previously recommended DNR, he will think about it      PLAN:  -Labs reviewed and adequate to proceed with same low dose FOLFIRI today -Lab, flush, f/u and FOLFIRI in 2 and 4 weeks -I ordered restaging CT scan to be done in 3-4 weeks, I gave him oral contrast today     No problem-specific Assessment &  Plan notes found  for this encounter.   Orders Placed This Encounter  Procedures   CT CHEST ABDOMEN PELVIS W CONTRAST    Standing Status:   Future    Standing Expiration Date:   06/11/2022    Order Specific Question:   If indicated for the ordered procedure, I authorize the administration of contrast media per Radiology protocol    Answer:   Yes    Order Specific Question:   Preferred imaging location?    Answer:   [facility]    Order Specific Question:   Release to patient    Answer:   Immediate    Order Specific Question:   Is Oral Contrast requested for this exam?    Answer:   Yes, Per Radiology protocol    Order Specific Question:   Reason for Exam (SYMPTOM  OR DIAGNOSIS REQUIRED)    Answer:   evaluate response to chemo   All questions were answered. The patient knows to call the clinic with any problems, questions or concerns. No barriers to learning was detected. The total time spent in the appointment was 30 minutes.     Truitt Merle, MD 06/11/2021   I, Wilburn Mylar, am acting as scribe for Truitt Merle, MD.   I have reviewed the above documentation for accuracy and completeness, and I agree with the above.

## 2021-06-13 ENCOUNTER — Other Ambulatory Visit: Payer: Self-pay

## 2021-06-13 ENCOUNTER — Inpatient Hospital Stay: Payer: Medicare Other | Attending: Hematology

## 2021-06-13 ENCOUNTER — Encounter: Payer: Self-pay | Admitting: Hematology

## 2021-06-13 VITALS — BP 120/77 | HR 88 | Temp 98.1°F | Resp 19 | Ht 66.0 in

## 2021-06-13 DIAGNOSIS — Z5189 Encounter for other specified aftercare: Secondary | ICD-10-CM | POA: Diagnosis not present

## 2021-06-13 DIAGNOSIS — C786 Secondary malignant neoplasm of retroperitoneum and peritoneum: Secondary | ICD-10-CM | POA: Insufficient documentation

## 2021-06-13 DIAGNOSIS — Z933 Colostomy status: Secondary | ICD-10-CM | POA: Diagnosis not present

## 2021-06-13 DIAGNOSIS — D500 Iron deficiency anemia secondary to blood loss (chronic): Secondary | ICD-10-CM | POA: Insufficient documentation

## 2021-06-13 DIAGNOSIS — Z5111 Encounter for antineoplastic chemotherapy: Secondary | ICD-10-CM | POA: Diagnosis not present

## 2021-06-13 DIAGNOSIS — Z9221 Personal history of antineoplastic chemotherapy: Secondary | ICD-10-CM | POA: Insufficient documentation

## 2021-06-13 DIAGNOSIS — C182 Malignant neoplasm of ascending colon: Secondary | ICD-10-CM

## 2021-06-13 MED ORDER — HEPARIN SOD (PORK) LOCK FLUSH 100 UNIT/ML IV SOLN
500.0000 [IU] | Freq: Once | INTRAVENOUS | Status: AC | PRN
Start: 2021-06-13 — End: 2021-06-13
  Administered 2021-06-13: 500 [IU]
  Filled 2021-06-13: qty 5

## 2021-06-13 MED ORDER — PEGFILGRASTIM-BMEZ 6 MG/0.6ML ~~LOC~~ SOSY
6.0000 mg | PREFILLED_SYRINGE | Freq: Once | SUBCUTANEOUS | Status: AC
  Administered 2021-06-13: 6 mg via SUBCUTANEOUS

## 2021-06-13 MED ORDER — SODIUM CHLORIDE 0.9% FLUSH
10.0000 mL | INTRAVENOUS | Status: DC | PRN
  Administered 2021-06-13: 10 mL
  Filled 2021-06-13: qty 10

## 2021-06-13 NOTE — Addendum Note (Signed)
Addended by: Truitt Merle on: 06/13/2021 12:12 PM   Modules accepted: Orders

## 2021-06-25 ENCOUNTER — Inpatient Hospital Stay (HOSPITAL_BASED_OUTPATIENT_CLINIC_OR_DEPARTMENT_OTHER): Payer: Medicare Other | Admitting: Hematology

## 2021-06-25 ENCOUNTER — Inpatient Hospital Stay: Payer: Medicare Other

## 2021-06-25 ENCOUNTER — Other Ambulatory Visit: Payer: Self-pay

## 2021-06-25 ENCOUNTER — Encounter: Payer: Self-pay | Admitting: Hematology

## 2021-06-25 DIAGNOSIS — D500 Iron deficiency anemia secondary to blood loss (chronic): Secondary | ICD-10-CM

## 2021-06-25 DIAGNOSIS — Z933 Colostomy status: Secondary | ICD-10-CM | POA: Diagnosis not present

## 2021-06-25 DIAGNOSIS — C786 Secondary malignant neoplasm of retroperitoneum and peritoneum: Secondary | ICD-10-CM | POA: Diagnosis not present

## 2021-06-25 DIAGNOSIS — Z95828 Presence of other vascular implants and grafts: Secondary | ICD-10-CM

## 2021-06-25 DIAGNOSIS — C182 Malignant neoplasm of ascending colon: Secondary | ICD-10-CM

## 2021-06-25 DIAGNOSIS — Z5111 Encounter for antineoplastic chemotherapy: Secondary | ICD-10-CM | POA: Diagnosis not present

## 2021-06-25 DIAGNOSIS — Z5189 Encounter for other specified aftercare: Secondary | ICD-10-CM | POA: Diagnosis not present

## 2021-06-25 DIAGNOSIS — Z9221 Personal history of antineoplastic chemotherapy: Secondary | ICD-10-CM | POA: Diagnosis not present

## 2021-06-25 LAB — CEA (IN HOUSE-CHCC): CEA (CHCC-In House): 4.11 ng/mL (ref 0.00–5.00)

## 2021-06-25 LAB — CBC WITH DIFFERENTIAL (CANCER CENTER ONLY)
Abs Immature Granulocytes: 0.42 10*3/uL — ABNORMAL HIGH (ref 0.00–0.07)
Basophils Absolute: 0.1 10*3/uL (ref 0.0–0.1)
Basophils Relative: 1 %
Eosinophils Absolute: 0.1 10*3/uL (ref 0.0–0.5)
Eosinophils Relative: 0 %
HCT: 34 % — ABNORMAL LOW (ref 39.0–52.0)
Hemoglobin: 11.7 g/dL — ABNORMAL LOW (ref 13.0–17.0)
Immature Granulocytes: 2 %
Lymphocytes Relative: 18 %
Lymphs Abs: 4.6 10*3/uL — ABNORMAL HIGH (ref 0.7–4.0)
MCH: 29.5 pg (ref 26.0–34.0)
MCHC: 34.4 g/dL (ref 30.0–36.0)
MCV: 85.9 fL (ref 80.0–100.0)
Monocytes Absolute: 2 10*3/uL — ABNORMAL HIGH (ref 0.1–1.0)
Monocytes Relative: 8 %
Neutro Abs: 18.8 10*3/uL — ABNORMAL HIGH (ref 1.7–7.7)
Neutrophils Relative %: 71 %
Platelet Count: 350 10*3/uL (ref 150–400)
RBC: 3.96 MIL/uL — ABNORMAL LOW (ref 4.22–5.81)
RDW: 20.8 % — ABNORMAL HIGH (ref 11.5–15.5)
WBC Count: 26.1 10*3/uL — ABNORMAL HIGH (ref 4.0–10.5)
nRBC: 0 % (ref 0.0–0.2)

## 2021-06-25 LAB — CMP (CANCER CENTER ONLY)
ALT: 25 U/L (ref 0–44)
AST: 26 U/L (ref 15–41)
Albumin: 3.7 g/dL (ref 3.5–5.0)
Alkaline Phosphatase: 134 U/L — ABNORMAL HIGH (ref 38–126)
Anion gap: 9 (ref 5–15)
BUN: 10 mg/dL (ref 8–23)
CO2: 29 mmol/L (ref 22–32)
Calcium: 9.4 mg/dL (ref 8.9–10.3)
Chloride: 103 mmol/L (ref 98–111)
Creatinine: 0.73 mg/dL (ref 0.61–1.24)
GFR, Estimated: >60 mL/min (ref >60)
Glucose, Bld: 101 mg/dL — ABNORMAL HIGH (ref 70–99)
Potassium: 4.4 mmol/L (ref 3.5–5.1)
Sodium: 141 mmol/L (ref 135–145)
Total Bilirubin: 0.5 mg/dL (ref 0.3–1.2)
Total Protein: 6.7 g/dL (ref 6.5–8.1)

## 2021-06-25 LAB — IRON AND TIBC
Iron: 77 ug/dL (ref 42–163)
Saturation Ratios: 27 % (ref 20–55)
TIBC: 286 ug/dL (ref 202–409)
UIBC: 209 ug/dL (ref 117–376)

## 2021-06-25 LAB — FERRITIN: Ferritin: 205 ng/mL (ref 24–336)

## 2021-06-25 MED ORDER — ATROPINE SULFATE 1 MG/ML IJ SOLN
INTRAMUSCULAR | Status: AC
  Filled 2021-06-25: qty 1

## 2021-06-25 MED ORDER — OXYCODONE HCL 5 MG PO TABS: 5.0000 mg | ORAL_TABLET | Freq: Three times a day (TID) | ORAL | 0 refills | Status: DC | PRN

## 2021-06-25 MED ORDER — HEPARIN SOD (PORK) LOCK FLUSH 100 UNIT/ML IV SOLN
500.0000 [IU] | Freq: Once | INTRAVENOUS | Status: DC | PRN
  Filled 2021-06-25: qty 5

## 2021-06-25 MED ORDER — SODIUM CHLORIDE 0.9% FLUSH
10.0000 mL | INTRAVENOUS | Status: DC | PRN
  Administered 2021-06-25: 10 mL
  Filled 2021-06-25: qty 10

## 2021-06-25 MED ORDER — PROCHLORPERAZINE MALEATE 10 MG PO TABS
10.0000 mg | ORAL_TABLET | Freq: Four times a day (QID) | ORAL | 2 refills | Status: AC | PRN
Start: 2021-06-25 — End: ?

## 2021-06-25 MED ORDER — DEXAMETHASONE SODIUM PHOSPHATE 100 MG/10ML IJ SOLN
10.0000 mg | Freq: Once | INTRAMUSCULAR | Status: AC
  Administered 2021-06-25: 10 mg via INTRAVENOUS
  Filled 2021-06-25: qty 10

## 2021-06-25 MED ORDER — SODIUM CHLORIDE 0.9 % IV SOLN
Freq: Once | INTRAVENOUS | Status: AC
  Filled 2021-06-25: qty 250

## 2021-06-25 MED ORDER — SODIUM CHLORIDE 0.9% FLUSH
10.0000 mL | INTRAVENOUS | Status: DC | PRN
  Filled 2021-06-25: qty 10

## 2021-06-25 MED ORDER — PALONOSETRON HCL INJECTION 0.25 MG/5ML
0.2500 mg | Freq: Once | INTRAVENOUS | Status: AC
  Administered 2021-06-25: 0.25 mg via INTRAVENOUS

## 2021-06-25 MED ORDER — ATROPINE SULFATE 1 MG/ML IJ SOLN
0.5000 mg | Freq: Once | INTRAMUSCULAR | Status: AC
  Administered 2021-06-25: 0.5 mg via INTRAVENOUS

## 2021-06-25 MED ORDER — SODIUM CHLORIDE 0.9 % IV SOLN
400.0000 mg/m2 | Freq: Once | INTRAVENOUS | Status: AC
  Administered 2021-06-25: 652 mg via INTRAVENOUS
  Filled 2021-06-25: qty 32.6

## 2021-06-25 MED ORDER — PALONOSETRON HCL INJECTION 0.25 MG/5ML
INTRAVENOUS | Status: AC
  Filled 2021-06-25: qty 5

## 2021-06-25 MED ORDER — SODIUM CHLORIDE 0.9 % IV SOLN
2000.0000 mg/m2 | INTRAVENOUS | Status: DC
  Administered 2021-06-25: 3250 mg via INTRAVENOUS
  Filled 2021-06-25: qty 65

## 2021-06-25 MED ORDER — SODIUM CHLORIDE 0.9 % IV SOLN
100.0000 mg/m2 | Freq: Once | INTRAVENOUS | Status: AC
  Administered 2021-06-25: 160 mg via INTRAVENOUS
  Filled 2021-06-25: qty 8

## 2021-06-25 NOTE — Progress Notes (Addendum)
[place]   Telephone:([phone]) [phone] Fax:(336) [phone]   Clinic Follow up Note   Patient Care Team: Lajean Manes, MD as PCP - General (Internal Medicine) Berle Mull, MD as Consulting Physician (Family Medicine) Clarene Essex, MD as Consulting Physician (Gastroenterology) Truitt Merle, MD as Consulting Physician (Oncology)  Date of Service:  06/25/2021  CHIEF COMPLAINT: f/u of metastatic colon cancer  SUMMARY OF ONCOLOGIC HISTORY: Oncology History Overview Note  Cancer Staging Cancer of right colon [facility]) Staging form: Colon and Rectum, AJCC 8th Edition - Clinical stage from 04/09/2019: Stage IVC (cTX, cNX, pM1c) - Signed by Truitt Merle, MD on 05/03/2019      Cancer of right colon [facility])  04/09/2019 Procedure   Colonoscopy 04/09/19 by Dr Watt Climes IMPRESSION -internal hemorrhoids -Diverticulosis in the sigmoid colon  -2 small polyps in the rectum and in the proximal transverse colon, removed with a hot snare. Resected and retrieved.  -3 medium polyps in the proximal transverse colon, in the mid transverse colon and in the distal transverse colon, removed and resected and retrieved.  -likely malignant partially obstructing tumor at the hepatic flexure. biopsied, tattooed.  -1 large polyp in the mid ascending colon  -the examination was otherwise normal      04/09/2019 Initial Biopsy   FINAL MICROSCOPIC DIAGNOSIS: 04/09/19 1. LG intestine-hepatic flexure, Biopsy:   INVASIVE WELL DIFFERENTIATED ADENOCARCINOMA    04/09/2019 Cancer Staging   Staging form: Colon and Rectum, AJCC 8th Edition - Clinical stage from 04/09/2019: Stage IVC (cTX, cNX, pM1c) - Signed by Truitt Merle, MD on 05/03/2019    04/10/2019 Imaging   CT AP 04/10/19  IMPRESSION: 1. There is an eccentric mass of the colon involving the ascending colon near the hepatic flexure measuring approximately 3.4 x 3.4 by 2.0 cm (series 2, image 37, series 3, image 37). There is extensive soft tissue nodularity of the  mesocolon and omentum, and likely areas of the peritoneum, for example bilateral upper quadrants (series 2, image 25). Findings are consistent with primary colon malignancy, probable omental and peritoneal involvement, and small volume malignant ascites. 2.  Other chronic and incidental findings as detailed above.    04/20/2019 Initial Diagnosis   Cancer of right colon ([place])    04/26/2019 Imaging   CT Chest 04/26/19 IMPRESSION: 1. Borderline to mild lower thoracic adenopathy, including within the right internal mammary and juxta cardiophrenic stations. Given the appearance of the upper abdomen, suspicious for nodal metastasis. 2. A low right paratracheal node is borderline sized, but favored to be reactive. 3. No evidence of pulmonary metastasis. 4. Peritoneal metastasis and abdominal ascites, as before. 5. Pancreatic parenchymal calcifications indicative of chronic calcific pancreatitis. 6. Coronary artery atherosclerosis.    04/27/2019 Pathology Results   Diagnosis 04/27/19 Peritoneum, biopsy, right upper quadrant, perihepatic - ADENOCARCINOMA, CONSISTENT WITH COLONIC PRIMARY. - SEE COMMENT.    05/16/2019 - 10/31/2019 Chemotherapy   First line FOLFOX every 2 weeks with Avastin starting with cycle 2 starting 05/16/19. 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 Stopped after 10/31/19 for surgery.    07/23/2019 Imaging    CT AP W Contrast  IMPRESSION: 1. Primary ascending colon mass may be minimally smaller. Peritoneal metastatic disease appears slightly improved as well. 2. Chronic calcific pancreatitis. 3. Tiny left renal stone. 4. Small left inguinal hernia contains a knuckle of unobstructed colon. 5. Enlarged prostate.   10/29/2019  Imaging   CT AP W Contrast IMPRESSION: No significant change in small ascending  colon soft tissue mass and diffuse omental carcinomatosis.   No new or progressive metastatic disease identified.   Stable small left inguinal hernia containing a loop of sigmoid colon. No evidence of bowel obstruction or strangulation.   Colonic diverticulosis. No radiographic evidence of diverticulitis.   Stable enlarged prostate.   12/17/2019 Pathology Results   HIPEC Surgery by Dr Clovis Riley 12/17/19 FINAL PATHOLOGIC DIAGNOSIS  MICROSCOPIC EXAMINATION AND DIAGNOSIS  A.          "RIGHT COLON, OMENTUM, SPLEEN":       Invasive adenocarcinoma with mucinous features, moderately differentiated.  Tumor involves mesentery and spleen.  Five out of ten lymph nodes involved by adenocarcinoma with perinodal soft tissue involvement (5/10), see comment.  Margins are uninvolved by invasive carcinoma.  Ileum and appendix, uninvolved.  See comment and cancer case summary.  B.          "ROUND LIGAMENT OF LIVER":       Involved by adenocarcinoma with mucinous features, moderately differentiated.  C.          "RIGHT DIAPHRAM STRIPPING":       Negative for carcinoma.  D.          "RIGHT HEPATIC CAPSULECTOMY":       Chronic inflammation and fibrosis.  Negative for carcinoma.  E.          "DEBRIDED TUMOR":       Involved by adenocarcinoma with mucinous features, moderately differentiated.   COMMENT: Sections disclose five out of ten lymph nodes involved by adenocarcinoma with perinodal soft tissue involvement. However, it is not possible to be certain if this represents lymph nodes replaced by tumor and/or soft tissue involvement. In addition, focal perineural invasion is seen.   04/03/2020 Imaging   CT AP W contrast  IMPRESSION: 1. Status post interval splenectomy and right hemicolectomy. 2. Response to therapy of omental/peritoneal metastasis. The only residual indeterminate finding is fluid density along the capsule of the hepatic dome which could be postoperative or secondary  to localized residual peritoneal disease. 3. Left nephrolithiasis. 4.  Possible constipation. 5. Prostatomegaly. 6.  Aortic Atherosclerosis (ICD10-I70.0).   07/11/2020 Imaging   CT AP  1.  Postsurgical changes of subtle reductive surgery including right hemicolectomy, omentectomy, splenectomy, and right hepatic capsulectomy.  2.  Interval development of multiple areas of low attenuation involving the liver, as described above. This the intraparenchymal low-density lesion is concerning for metastatic disease. Other disease along the capsule may be post therapy related. Residual thickening of the anterior peritoneum particularly anterior to the right lobe of the liver is also concerning for some residual disease although appears significantly improved. Recommend attention on follow-up.  3.  No definite evidence of residual peritoneal disease status post HIPEC.   09/18/2020 -  Chemotherapy   Second line chemo FOLFIRI and bevacizumab q2weeks starting 09/18/20. Chemo on hold since 12/23/20 due to fistula. RESTART with low dose irinotecan alone on 04/03/21.   12/19/2020 Imaging   CT CAP  IMPRESSION: 1. Interval development of a 1.6 x 2.7 x 1.6 cm collection of gas and debris between the sigmoid colon and dome of bladder, compatible with small abscess. A portion of this abscess is probably intramural at the dome of bladder. Marked associated thickening of the adjacent colonic and bladder walls with tiny gas bubbles in the thickened bladder wall and prominent amount of free gas in the bladder lumen suggests an  associated colovesical fistula. Tiny gas bubbles are also seen in the central prostate gland and may be in the urethra although parenchymal gas within the prostate gland is not excluded on this exam. 2. Nodularity along the major and minor fissures of the right hemithorax has decreased in the interval, nearly imperceptible today. 3. Peritoneal implants along the liver, right abdomen, and deep  to the left paramidline anterior abdominal wall have resolved in the interval by CT imaging. 4. Tiny right lower lobe pulmonary nodule is more conspicuous today. Attention on follow-up recommended. 5. Stable 4 mm hypodensity in the dome of the right liver, too small to characterize. 6. Nonobstructing left renal stones with bilateral renal cysts. 7. Stable compression deformity at multiple thoracic and lumbar levels. 8. Aortic Atherosclerosis (ICD10-I70.0).   02/24/2021 Surgery   LAPAROTOMY EXPLORATORY resection of sigmoid colon and proximal rectum, takedown of colovesical fistula, creation of end ileosotmy by Dr Clovis Riley    Final Pathologic Diagnosis      A.  SIGMOID COLON AND RECTUM, RESECTION:               Invasive adenocarcinoma with mucinous features, moderately differentiated.               Tumor measures 3.2 cm in greatest dimension. Tumor invades visceral peritoneum.  Lymphovascular invasion is present.               Metastatic adenocarcinoma involving seven (of 22) lymph nodes (7/22).  One margin involved by adenocarcinoma (grossly presumed distal margin).     03/25/2021 Imaging   IMPRESSION: 1. New bilateral pulmonary nodules worrisome for new pulmonary metastatic disease. 2. New small right pleural effusion with overlying atelectasis. 3. Recurrent pleural nodularity on the right. 4. No findings for abdominal/pelvic metastatic disease. 5. Stable severe atrophy of the pancreatic body and tail with associated main pancreatic duct dilatation. No obvious obstructing pancreatic lesion but this was not present on the prior CT scan from 04/03/2020. It may be due to a stricture or a small ductal lesion, not well seen. MRI abdomen without and with contrast may be helpful for further evaluation of this finding. 6. Stable surgical changes involving the colon with a Hartmann's pouch and left lower quadrant colostomy. 7. Stable enlarged prostate gland. 8. Stable thoracic and  lumbar compression fractures.      CURRENT THERAPY:  Second line chemo FOLFIRI and bevacizumab q2weeks starting 09/18/20. Chemo on hold since 12/23/20 due to fistula. RESTART with low dose irinotecan alone on 04/03/21.  INTERVAL HISTORY:  Nathaniel Norton is here for a follow up of metastatic colon cancer. He was last seen by me on 06/11/21. He presents to the clinic alone. He notes he has been eating well. He notes he has issues snacking between meals; he eats well during regular meals. He notes the pump causes difficulty sleeping the first night after placement, mostly due to location. He notes he is limited in the things he can do because of prior back injuries, not related to the cancer. He notes he takes plenty of breaks, especially when he works outside. He reports he did not have any vomiting following his last cycle.   All other systems were reviewed with the patient and are negative.  MEDICAL HISTORY:  Past Medical History:  Diagnosis Date   colon ca dx'd 03/2019    SURGICAL HISTORY: Past Surgical History:  Procedure Laterality Date   CATARACT EXTRACTION     IR IMAGING GUIDED PORT INSERTION  05/10/2019   L4  fracture  2012   RETINAL DETACHMENT SURGERY     Right hand surgery  1974    I have reviewed the social history and family history with the patient and they are unchanged from previous note.  ALLERGIES:  is allergic to feraheme [ferumoxytol] and codeine.  MEDICATIONS:  Current Outpatient Medications  Medication Sig Dispense Refill   diphenoxylate-atropine (LOMOTIL) 2.5-0.025 MG tablet Take 1-2 tablets by mouth 4 (four) times daily as needed for diarrhea or loose stools. 90 tablet 1   fluticasone (FLONASE) 50 MCG/ACT nasal spray Place into the nose.     lidocaine-prilocaine (EMLA) cream Apply 1 application topically as needed. 30 g 1   Multiple Vitamin (MULTIVITAMIN) tablet Take 1 tablet by mouth daily.     MULTIPLE VITAMIN PO      ondansetron (ZOFRAN) 8 MG tablet  Take 1 tablet (8 mg total) by mouth every 8 (eight) hours as needed for nausea or vomiting. Start on day 3 after chemotherapy 20 tablet 0   ondansetron (ZOFRAN-ODT) 4 MG disintegrating tablet Take 4 mg by mouth every 8 (eight) hours as needed.     oxyCODONE (OXY IR/ROXICODONE) 5 MG immediate release tablet Take 1 tablet (5 mg total) by mouth every 8 (eight) hours as needed for severe pain. 90 tablet 0   prochlorperazine (COMPAZINE) 10 MG tablet Take 1 tablet (10 mg total) by mouth every 6 (six) hours as needed (Nausea or vomiting). 30 tablet 2   sodium chloride flush 0.9 % SOLN injection      No current facility-administered medications for this visit.   Facility-Administered Medications Ordered in Other Visits  Medication Dose Route Frequency Provider Last Rate Last Admin   atropine injection 0.5 mg  0.5 mg Intravenous Once PRN Truitt Merle, MD       dexamethasone (DECADRON) 10 mg in sodium chloride 0.9 % 50 mL IVPB  10 mg Intravenous Once Truitt Merle, MD 204 mL/hr at 06/25/21 1318 10 mg at 06/25/21 1318   fluorouracil (ADRUCIL) 3,250 mg in sodium chloride 0.9 % 85 mL chemo infusion  2,000 mg/m2 (Treatment Plan Recorded) Intravenous 1 day or 1 dose Truitt Merle, MD       heparin lock flush 100 unit/mL  500 Units Intracatheter Once PRN Truitt Merle, MD       irinotecan (CAMPTOSAR) 160 mg in sodium chloride 0.9 % 500 mL chemo infusion  100 mg/m2 (Treatment Plan Recorded) Intravenous Once Truitt Merle, MD       leucovorin 652 mg in sodium chloride 0.9 % 250 mL infusion  400 mg/m2 (Treatment Plan Recorded) Intravenous Once Truitt Merle, MD       palonosetron (ALOXI) injection 0.25 mg  0.25 mg Intravenous Once Truitt Merle, MD       sodium chloride flush (NS) 0.9 % injection 10 mL  10 mL Intracatheter PRN Truitt Merle, MD   10 mL at 05/30/21 1145   sodium chloride flush (NS) 0.9 % injection 10 mL  10 mL Intracatheter PRN Truitt Merle, MD        PHYSICAL EXAMINATION: ECOG PERFORMANCE STATUS: 1 - Symptomatic but completely  ambulatory  Vitals:   06/25/21 1159  BP: 121/78  Pulse: 78  Resp: 20  Temp: 98.6 F (37 C)  SpO2: 100%   Filed Weights   06/25/21 1159  Weight: 127 lb 4.8 oz (57.7 kg)    Due to COVID19 we will limit examination to appearance. Patient had no complaints.  GENERAL:alert, no distress and comfortable SKIN: skin color normal,  no rashes or significant lesions EYES: normal, Conjunctiva are pink and non-injected, sclera clear  NEURO: alert & oriented x 3 with fluent speech  LABORATORY DATA:  I have reviewed the data as listed CBC Latest Ref Rng & Units 06/25/2021 06/11/2021 05/28/2021  WBC 4.0 - 10.5 K/uL 26.1(H) 17.1(H) 6.4  Hemoglobin 13.0 - 17.0 g/dL 11.7(L) 11.0(L) 10.2(L)  Hematocrit 39.0 - 52.0 % 34.0(L) 32.6(L) 30.1(L)  Platelets 150 - 400 K/uL 350 313 258     CMP Latest Ref Rng & Units 06/25/2021 06/11/2021 05/28/2021  Glucose 70 - 99 mg/dL 101(H) 105(H) 99  BUN 8 - 23 mg/dL '10 9 16  ' Creatinine 0.61 - 1.24 mg/dL 0.73 0.68 0.78  Sodium 135 - 145 mmol/L 141 140 142  Potassium 3.5 - 5.1 mmol/L 4.4 3.9 3.6  Chloride 98 - 111 mmol/L 103 105 107  CO2 22 - 32 mmol/L '29 28 29  ' Calcium 8.9 - 10.3 mg/dL 9.4 9.0 9.0  Total Protein 6.5 - 8.1 g/dL 6.7 6.6 6.5  Total Bilirubin 0.3 - 1.2 mg/dL 0.5 0.8 1.3(H)  Alkaline Phos 38 - 126 U/L 134(H) 114 69  AST 15 - 41 U/L 26 14(L) 24  ALT 0 - 44 U/L '25 13 28      ' RADIOGRAPHIC STUDIES: I have personally reviewed the radiological images as listed and agreed with the findings in the report. No results found.   ASSESSMENT & PLAN:  Ilias Stcharles is a 70 y.o. male with   1. Cancer of right colon, with peritoneal and lung metastasis, stage IV,  MMR normal, KRAS mutation (+), MSS -He was diagnosed in 03/2019. His Colonoscopy biopsy showed adenocarcinoma of right hepatic flexure colon. His peritoneal biopsy from 04/27/19 shows adenocarcinoma, consistent with metastasis of his colon cancer. This is now stage IV disease. -He was treated with  first-line FOLFOX in June-November 2020 before proceeding with HIPEC surgery by Dr. Clovis Riley on 12/17/19.  -Unfortunately he had disease progression on 09/10/20 PET with hypermetabolic pleural (right) and peritoneal nodules, which are consistent with metastatic disease. A biopsy was not felt to be necessary given his previously known peritoneal metastasis -He started second line FOLFIRI and bevacizumab q2weeks on 09/18/20, tolerated first 2 cycles poorly with abdominal pain/cramping and diarrhea.  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 Clovis Riley on 02/24/21.   -New baseline CT 03/25/21 showed new bilateral pulmonary small nodules, worrisome for new pulmonary metastatic disease.  No abdominal or pelvic metastatic disease -Resumed low-dose single agent irinotecan on 04/03/2021, added low-dose 5-FU back on 05/14/21.  He overall tolerated well. -He is clinically stable. Lab reviewed, adequate for treatment, will proceed low-dose FOLFIRI today and continue every 2 weeks -Plan to repeat scan prior to his next cycle. He has oral contrast at home.   2. Symptom management: Nausea, vomiting, weight loss, incontinence -secondary to chemotherapy, experiences nausea with vomiting in first day or two after treatment -previously met with nutrition in 10/2020 and more recently in 04/2021 -His weight has gone down since diagnosis but has been stable restarting the irinotecan. -He is now taking the compazine prior to treatment and for three days following. -For his incontinence, I previously recommended pelvic floor therapy. He would like  to defer this for now. -He did not have vomiting with C12   3. 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 Watt Climes showed internal hemorrhoids, benign polyps, diverticulosis and colon cancer. -He was treated with oral iron, IV Feraheme on 05/03/19 and 05/16/19 (had reaction with 2nd dose).  -With cancer recurrence his anemia recurred. Hgb went down when he restarted chemotherapy in 03/2021, recovering since 05/2021. Hgb 11.7 today (06/25/21)   4. Goal of care discussion -The patient understands the goal of care is palliative. -I previously recommended DNR, he will think about it      PLAN:  -proceed with same low dose FOLFIRI today if lab adequate  -will ask my nurse to schedule restaging CT to be done within next 2 weeks, prior to next cycle (contrast given at last visit) -Lab, flush, f/u and FOLFIRI in 2 and 4 weeks -I refilled oxycodone and compazine     No problem-specific Assessment & Plan notes found for this encounter.   No orders of the defined types were placed in this encounter.  All questions were answered. The patient knows to call the clinic with any problems, questions or concerns. No barriers to learning was detected. The total time spent in the appointment was 30 minutes.     Truitt Merle, MD 06/25/2021   I, Wilburn Mylar, am acting as scribe for Truitt Merle, MD.   I have reviewed the above documentation for accuracy and completeness, and I agree with the above.

## 2021-06-25 NOTE — Patient Instructions (Signed)
[place] ONCOLOGY  Discharge Instructions: Thank you for choosing [facility] to provide your oncology and hematology care.   If you have a lab appointment with the [facility], please go directly to the [facility] and check in at the registration area.   Wear comfortable clothing and clothing appropriate for easy access to any Portacath or PICC line.   We strive to give you quality time with your provider. You may need to reschedule your appointment if you arrive late (15 or more minutes).  Arriving late affects you and other patients whose appointments are after yours.  Also, if you miss three or more appointments without notifying the office, you may be dismissed from the clinic at the provider's discretion.      For prescription refill requests, have your pharmacy contact our office and allow 72 hours for refills to be completed.    Today you received the following chemotherapy and/or immunotherapy agents irinotecan, leucovorin, fluorourcil      To help prevent nausea and vomiting after your treatment, we encourage you to take your nausea medication as directed.  BELOW ARE SYMPTOMS THAT SHOULD BE REPORTED IMMEDIATELY: *FEVER GREATER THAN 100.4 F (38 C) OR HIGHER *CHILLS OR SWEATING *NAUSEA AND VOMITING THAT IS NOT CONTROLLED WITH YOUR NAUSEA MEDICATION *UNUSUAL SHORTNESS OF BREATH *UNUSUAL BRUISING OR BLEEDING *URINARY PROBLEMS (pain or burning when urinating, or frequent urination) *BOWEL PROBLEMS (unusual diarrhea, constipation, pain near the anus) TENDERNESS IN MOUTH AND THROAT WITH OR WITHOUT PRESENCE OF ULCERS (sore throat, sores in mouth, or a toothache) UNUSUAL RASH, SWELLING OR PAIN  UNUSUAL VAGINAL DISCHARGE OR ITCHING   Items with * indicate a potential emergency and should be followed up as soon as possible or go to the Emergency Department if any problems should occur.  Please show the CHEMOTHERAPY ALERT CARD or IMMUNOTHERAPY  ALERT CARD at check-in to the Emergency Department and triage nurse.  Should you have questions after your visit or need to cancel or reschedule your appointment, please contact [place] [facility]  Dept: [phone]  and follow the prompts.  Office hours are 8:00 a.m. to 4:30 p.m. Monday - Friday. Please note that voicemails left after 4:00 p.m. may not be returned until the following business day.  We are closed weekends and major holidays. You have access to a nurse at all times for urgent questions. Please call the main number to the clinic Dept: [phone] and follow the prompts.   For any non-urgent questions, you may also contact your provider using MyChart. We now offer e-Visits for anyone 59 and older to request care online for non-urgent symptoms. For details visit [email or web address].   Also download the MyChart app! Go to the app store, search "MyChart", open the app, select [facility], and log in with your MyChart username and password.  Due to Covid, a mask is required upon entering the hospital/clinic. If you do not have a mask, one will be given to you upon arrival. For doctor visits, patients may have 1 support person aged 63 or older with them. For treatment visits, patients cannot have anyone with them due to current Covid guidelines and our immunocompromised population.

## 2021-06-27 ENCOUNTER — Inpatient Hospital Stay: Payer: Medicare Other

## 2021-06-27 ENCOUNTER — Other Ambulatory Visit: Payer: Self-pay

## 2021-06-27 VITALS — BP 130/76 | HR 98 | Temp 98.6°F | Resp 18

## 2021-06-27 DIAGNOSIS — Z5111 Encounter for antineoplastic chemotherapy: Secondary | ICD-10-CM | POA: Diagnosis not present

## 2021-06-27 DIAGNOSIS — Z5189 Encounter for other specified aftercare: Secondary | ICD-10-CM | POA: Diagnosis not present

## 2021-06-27 DIAGNOSIS — Z933 Colostomy status: Secondary | ICD-10-CM | POA: Diagnosis not present

## 2021-06-27 DIAGNOSIS — C182 Malignant neoplasm of ascending colon: Secondary | ICD-10-CM

## 2021-06-27 DIAGNOSIS — D500 Iron deficiency anemia secondary to blood loss (chronic): Secondary | ICD-10-CM | POA: Diagnosis not present

## 2021-06-27 DIAGNOSIS — Z9221 Personal history of antineoplastic chemotherapy: Secondary | ICD-10-CM | POA: Diagnosis not present

## 2021-06-27 DIAGNOSIS — C786 Secondary malignant neoplasm of retroperitoneum and peritoneum: Secondary | ICD-10-CM | POA: Diagnosis not present

## 2021-06-27 MED ORDER — HEPARIN SOD (PORK) LOCK FLUSH 100 UNIT/ML IV SOLN
500.0000 [IU] | Freq: Once | INTRAVENOUS | Status: AC | PRN
  Administered 2021-06-27: 500 [IU]
  Filled 2021-06-27: qty 5

## 2021-06-27 MED ORDER — SODIUM CHLORIDE 0.9% FLUSH
10.0000 mL | INTRAVENOUS | Status: DC | PRN
  Administered 2021-06-27: 10 mL
  Filled 2021-06-27: qty 10

## 2021-07-07 ENCOUNTER — Other Ambulatory Visit: Payer: Self-pay

## 2021-07-07 ENCOUNTER — Encounter (HOSPITAL_COMMUNITY): Payer: Self-pay

## 2021-07-07 ENCOUNTER — Ambulatory Visit (HOSPITAL_COMMUNITY)
Admission: RE | Admit: 2021-07-07 | Discharge: 2021-07-07 | Disposition: A | Discharge status: Home / Self Care | Payer: Medicare Other | Source: Ambulatory Visit | Attending: Hematology | Admitting: Hematology

## 2021-07-07 DIAGNOSIS — C182 Malignant neoplasm of ascending colon: Secondary | ICD-10-CM | POA: Diagnosis not present

## 2021-07-07 DIAGNOSIS — J9811 Atelectasis: Secondary | ICD-10-CM | POA: Diagnosis not present

## 2021-07-07 DIAGNOSIS — R918 Other nonspecific abnormal finding of lung field: Secondary | ICD-10-CM | POA: Diagnosis not present

## 2021-07-07 DIAGNOSIS — C189 Malignant neoplasm of colon, unspecified: Secondary | ICD-10-CM | POA: Diagnosis not present

## 2021-07-07 DIAGNOSIS — J90 Pleural effusion, not elsewhere classified: Secondary | ICD-10-CM | POA: Diagnosis not present

## 2021-07-07 DIAGNOSIS — K8689 Other specified diseases of pancreas: Secondary | ICD-10-CM | POA: Diagnosis not present

## 2021-07-07 DIAGNOSIS — K6389 Other specified diseases of intestine: Secondary | ICD-10-CM | POA: Diagnosis not present

## 2021-07-07 MED ORDER — IOHEXOL 350 MG/ML SOLN
100.0000 mL | Freq: Once | INTRAVENOUS | Status: AC | PRN
  Administered 2021-07-07: 80 mL via INTRAVENOUS

## 2021-07-07 MED ORDER — HEPARIN SOD (PORK) LOCK FLUSH 100 UNIT/ML IV SOLN
INTRAVENOUS | Status: AC
  Filled 2021-07-07: qty 5

## 2021-07-07 MED ORDER — HEPARIN SOD (PORK) LOCK FLUSH 100 UNIT/ML IV SOLN
500.0000 [IU] | Freq: Once | INTRAVENOUS | Status: AC
  Administered 2021-07-07: 500 [IU] via INTRAVENOUS

## 2021-07-09 ENCOUNTER — Inpatient Hospital Stay: Payer: Medicare Other

## 2021-07-09 ENCOUNTER — Other Ambulatory Visit: Payer: Self-pay

## 2021-07-09 ENCOUNTER — Encounter: Payer: Self-pay | Admitting: Hematology

## 2021-07-09 ENCOUNTER — Inpatient Hospital Stay (HOSPITAL_BASED_OUTPATIENT_CLINIC_OR_DEPARTMENT_OTHER): Payer: Medicare Other | Admitting: Hematology

## 2021-07-09 VITALS — BP 117/78 | HR 71 | Temp 98.2°F | Resp 18 | Ht 66.0 in | Wt 130.9 lb

## 2021-07-09 DIAGNOSIS — Z95828 Presence of other vascular implants and grafts: Secondary | ICD-10-CM

## 2021-07-09 DIAGNOSIS — Z933 Colostomy status: Secondary | ICD-10-CM | POA: Diagnosis not present

## 2021-07-09 DIAGNOSIS — Z5189 Encounter for other specified aftercare: Secondary | ICD-10-CM | POA: Diagnosis not present

## 2021-07-09 DIAGNOSIS — D500 Iron deficiency anemia secondary to blood loss (chronic): Secondary | ICD-10-CM | POA: Diagnosis not present

## 2021-07-09 DIAGNOSIS — C182 Malignant neoplasm of ascending colon: Secondary | ICD-10-CM | POA: Diagnosis not present

## 2021-07-09 DIAGNOSIS — Z9221 Personal history of antineoplastic chemotherapy: Secondary | ICD-10-CM | POA: Diagnosis not present

## 2021-07-09 DIAGNOSIS — C786 Secondary malignant neoplasm of retroperitoneum and peritoneum: Secondary | ICD-10-CM | POA: Diagnosis not present

## 2021-07-09 DIAGNOSIS — Z5111 Encounter for antineoplastic chemotherapy: Secondary | ICD-10-CM | POA: Diagnosis not present

## 2021-07-09 LAB — CMP (CANCER CENTER ONLY)
ALT: 14 U/L (ref 0–44)
AST: 17 U/L (ref 15–41)
Albumin: 3.5 g/dL (ref 3.5–5.0)
Alkaline Phosphatase: 85 U/L (ref 38–126)
Anion gap: 7 (ref 5–15)
BUN: 13 mg/dL (ref 8–23)
CO2: 29 mmol/L (ref 22–32)
Calcium: 9.6 mg/dL (ref 8.9–10.3)
Chloride: 105 mmol/L (ref 98–111)
Creatinine: 0.8 mg/dL (ref 0.61–1.24)
GFR, Estimated: >60 mL/min (ref >60)
Glucose, Bld: 93 mg/dL (ref 70–99)
Potassium: 4.2 mmol/L (ref 3.5–5.1)
Sodium: 141 mmol/L (ref 135–145)
Total Bilirubin: 0.8 mg/dL (ref 0.3–1.2)
Total Protein: 6.6 g/dL (ref 6.5–8.1)

## 2021-07-09 LAB — CBC WITH DIFFERENTIAL (CANCER CENTER ONLY)
Abs Immature Granulocytes: 0.02 10*3/uL (ref 0.00–0.07)
Basophils Absolute: 0.1 10*3/uL (ref 0.0–0.1)
Basophils Relative: 1 %
Eosinophils Absolute: 0.1 10*3/uL (ref 0.0–0.5)
Eosinophils Relative: 1 %
HCT: 32.5 % — ABNORMAL LOW (ref 39.0–52.0)
Hemoglobin: 11 g/dL — ABNORMAL LOW (ref 13.0–17.0)
Immature Granulocytes: 0 %
Lymphocytes Relative: 26 %
Lymphs Abs: 2 10*3/uL (ref 0.7–4.0)
MCH: 29.5 pg (ref 26.0–34.0)
MCHC: 33.8 g/dL (ref 30.0–36.0)
MCV: 87.1 fL (ref 80.0–100.0)
Monocytes Absolute: 1 10*3/uL (ref 0.1–1.0)
Monocytes Relative: 12 %
Neutro Abs: 4.7 10*3/uL (ref 1.7–7.7)
Neutrophils Relative %: 60 %
Platelet Count: 291 10*3/uL (ref 150–400)
RBC: 3.73 MIL/uL — ABNORMAL LOW (ref 4.22–5.81)
RDW: 19.8 % — ABNORMAL HIGH (ref 11.5–15.5)
WBC Count: 7.9 10*3/uL (ref 4.0–10.5)
nRBC: 0 % (ref 0.0–0.2)

## 2021-07-09 MED ORDER — SODIUM CHLORIDE 0.9 % IV SOLN
Freq: Once | INTRAVENOUS | Status: AC
  Filled 2021-07-09: qty 250

## 2021-07-09 MED ORDER — SODIUM CHLORIDE 0.9 % IV SOLN
2000.0000 mg/m2 | INTRAVENOUS | Status: DC
  Administered 2021-07-09: 3250 mg via INTRAVENOUS
  Filled 2021-07-09: qty 65

## 2021-07-09 MED ORDER — SODIUM CHLORIDE 0.9 % IV SOLN
10.0000 mg | Freq: Once | INTRAVENOUS | Status: AC
  Administered 2021-07-09: 10 mg via INTRAVENOUS
  Filled 2021-07-09: qty 10

## 2021-07-09 MED ORDER — SODIUM CHLORIDE 0.9 % IV SOLN
100.0000 mg/m2 | Freq: Once | INTRAVENOUS | Status: AC
  Administered 2021-07-09: 160 mg via INTRAVENOUS
  Filled 2021-07-09: qty 8

## 2021-07-09 MED ORDER — PALONOSETRON HCL INJECTION 0.25 MG/5ML
0.2500 mg | Freq: Once | INTRAVENOUS | Status: AC
  Administered 2021-07-09: 0.25 mg via INTRAVENOUS

## 2021-07-09 MED ORDER — ATROPINE SULFATE 1 MG/ML IJ SOLN
0.5000 mg | Freq: Once | INTRAMUSCULAR | Status: AC | PRN
  Administered 2021-07-09: 0.5 mg via INTRAVENOUS

## 2021-07-09 MED ORDER — SODIUM CHLORIDE 0.9 % IV SOLN
400.0000 mg/m2 | Freq: Once | INTRAVENOUS | Status: AC
  Administered 2021-07-09: 652 mg via INTRAVENOUS
  Filled 2021-07-09: qty 32.6

## 2021-07-09 MED ORDER — SODIUM CHLORIDE 0.9% FLUSH
10.0000 mL | INTRAVENOUS | Status: DC | PRN
  Filled 2021-07-09: qty 10

## 2021-07-09 MED ORDER — SODIUM CHLORIDE 0.9% FLUSH
10.0000 mL | INTRAVENOUS | Status: DC | PRN
  Administered 2021-07-09: 10 mL
  Filled 2021-07-09: qty 10

## 2021-07-09 NOTE — Progress Notes (Signed)
[place]   [phone] Fax:([phone]   Clinic Follow up Note   Patient Care Team: Lajean Manes, MD as PCP - General (Internal Medicine) Berle Mull, MD as Consulting Physician (Family Medicine) Clarene Essex, MD as Consulting Physician (Gastroenterology) Truitt Merle, MD as Consulting Physician (Oncology)  Date of Service:  07/09/2021  CHIEF COMPLAINT: f/u of metastatic colon cancer  SUMMARY OF ONCOLOGIC HISTORY: Oncology History Overview Note  Cancer Staging Cancer of right colon [facility]) Staging form: Colon and Rectum, AJCC 8th Edition - Clinical stage from 04/09/2019: Stage IVC (cTX, cNX, pM1c) - Signed by Truitt Merle, MD on 05/03/2019      Cancer of right colon [facility])  04/09/2019 Procedure   Colonoscopy 04/09/19 by Dr Watt Climes IMPRESSION -internal hemorrhoids -Diverticulosis in the sigmoid colon  -2 small polyps in the rectum and in the proximal transverse colon, removed with a hot snare. Resected and retrieved.  -3 medium polyps in the proximal transverse colon, in the mid transverse colon and in the distal transverse colon, removed and resected and retrieved.  -likely malignant partially obstructing tumor at the hepatic flexure. biopsied, tattooed.  -1 large polyp in the mid ascending colon  -the examination was otherwise normal      04/09/2019 Initial Biopsy   FINAL MICROSCOPIC DIAGNOSIS: 04/09/19 1. LG intestine-hepatic flexure, Biopsy:   INVASIVE WELL DIFFERENTIATED ADENOCARCINOMA    04/09/2019 Cancer Staging   Staging form: Colon and Rectum, AJCC 8th Edition - Clinical stage from 04/09/2019: Stage IVC (cTX, cNX, pM1c) - Signed by Truitt Merle, MD on 05/03/2019    04/10/2019 Imaging   CT AP 04/10/19  IMPRESSION: 1. There is an eccentric mass of the colon involving the ascending colon near the hepatic flexure measuring approximately 3.4 x 3.4 by 2.0 cm (series 2, image 37, series 3, image 37). There is extensive soft tissue nodularity of the  mesocolon and omentum, and likely areas of the peritoneum, for example bilateral upper quadrants (series 2, image 25). Findings are consistent with primary colon malignancy, probable omental and peritoneal involvement, and small volume malignant ascites. 2.  Other chronic and incidental findings as detailed above.    04/20/2019 Initial Diagnosis   Cancer of right colon ([place])    04/26/2019 Imaging   CT Chest 04/26/19 IMPRESSION: 1. Borderline to mild lower thoracic adenopathy, including within the right internal mammary and juxta cardiophrenic stations. Given the appearance of the upper abdomen, suspicious for nodal metastasis. 2. A low right paratracheal node is borderline sized, but favored to be reactive. 3. No evidence of pulmonary metastasis. 4. Peritoneal metastasis and abdominal ascites, as before. 5. Pancreatic parenchymal calcifications indicative of chronic calcific pancreatitis. 6. Coronary artery atherosclerosis.    04/27/2019 Pathology Results   Diagnosis 04/27/19 Peritoneum, biopsy, right upper quadrant, perihepatic - ADENOCARCINOMA, CONSISTENT WITH COLONIC PRIMARY. - SEE COMMENT.    05/16/2019 - 10/31/2019 Chemotherapy   First line FOLFOX every 2 weeks with Avastin starting with cycle 2 starting 05/16/19. 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 Stopped after 10/31/19 for surgery.    07/23/2019 Imaging    CT AP W Contrast  IMPRESSION: 1. Primary ascending colon mass may be minimally smaller. Peritoneal metastatic disease appears slightly improved as well. 2. Chronic calcific pancreatitis. 3. Tiny left renal stone. 4. Small left inguinal hernia contains a knuckle of unobstructed colon. 5. Enlarged prostate.   10/29/2019  Imaging   CT AP W Contrast IMPRESSION: No significant change in small ascending  colon soft tissue mass and diffuse omental carcinomatosis.   No new or progressive metastatic disease identified.   Stable small left inguinal hernia containing a loop of sigmoid colon. No evidence of bowel obstruction or strangulation.   Colonic diverticulosis. No radiographic evidence of diverticulitis.   Stable enlarged prostate.   12/17/2019 Pathology Results   HIPEC Surgery by Dr Clovis Riley 12/17/19 FINAL PATHOLOGIC DIAGNOSIS  MICROSCOPIC EXAMINATION AND DIAGNOSIS  A.          "RIGHT COLON, OMENTUM, SPLEEN":       Invasive adenocarcinoma with mucinous features, moderately differentiated.  Tumor involves mesentery and spleen.  Five out of ten lymph nodes involved by adenocarcinoma with perinodal soft tissue involvement (5/10), see comment.  Margins are uninvolved by invasive carcinoma.  Ileum and appendix, uninvolved.  See comment and cancer case summary.  B.          "ROUND LIGAMENT OF LIVER":       Involved by adenocarcinoma with mucinous features, moderately differentiated.  C.          "RIGHT DIAPHRAM STRIPPING":       Negative for carcinoma.  D.          "RIGHT HEPATIC CAPSULECTOMY":       Chronic inflammation and fibrosis.  Negative for carcinoma.  E.          "DEBRIDED TUMOR":       Involved by adenocarcinoma with mucinous features, moderately differentiated.   COMMENT: Sections disclose five out of ten lymph nodes involved by adenocarcinoma with perinodal soft tissue involvement. However, it is not possible to be certain if this represents lymph nodes replaced by tumor and/or soft tissue involvement. In addition, focal perineural invasion is seen.   04/03/2020 Imaging   CT AP W contrast  IMPRESSION: 1. Status post interval splenectomy and right hemicolectomy. 2. Response to therapy of omental/peritoneal metastasis. The only residual indeterminate finding is fluid density along the capsule of the hepatic dome which could be postoperative or secondary  to localized residual peritoneal disease. 3. Left nephrolithiasis. 4.  Possible constipation. 5. Prostatomegaly. 6.  Aortic Atherosclerosis (ICD10-I70.0).   07/11/2020 Imaging   CT AP  1.  Postsurgical changes of subtle reductive surgery including right hemicolectomy, omentectomy, splenectomy, and right hepatic capsulectomy.  2.  Interval development of multiple areas of low attenuation involving the liver, as described above. This the intraparenchymal low-density lesion is concerning for metastatic disease. Other disease along the capsule may be post therapy related. Residual thickening of the anterior peritoneum particularly anterior to the right lobe of the liver is also concerning for some residual disease although appears significantly improved. Recommend attention on follow-up.  3.  No definite evidence of residual peritoneal disease status post HIPEC.   09/18/2020 -  Chemotherapy   Second line chemo FOLFIRI and bevacizumab q2weeks starting 09/18/20. Chemo on hold since 12/23/20 due to fistula. RESTART with low dose irinotecan alone on 04/03/21.   12/19/2020 Imaging   CT CAP  IMPRESSION: 1. Interval development of a 1.6 x 2.7 x 1.6 cm collection of gas and debris between the sigmoid colon and dome of bladder, compatible with small abscess. A portion of this abscess is probably intramural at the dome of bladder. Marked associated thickening of the adjacent colonic and bladder walls with tiny gas bubbles in the thickened bladder wall and prominent amount of free gas in the bladder lumen suggests an  associated colovesical fistula. Tiny gas bubbles are also seen in the central prostate gland and may be in the urethra although parenchymal gas within the prostate gland is not excluded on this exam. 2. Nodularity along the major and minor fissures of the right hemithorax has decreased in the interval, nearly imperceptible today. 3. Peritoneal implants along the liver, right abdomen, and deep  to the left paramidline anterior abdominal wall have resolved in the interval by CT imaging. 4. Tiny right lower lobe pulmonary nodule is more conspicuous today. Attention on follow-up recommended. 5. Stable 4 mm hypodensity in the dome of the right liver, too small to characterize. 6. Nonobstructing left renal stones with bilateral renal cysts. 7. Stable compression deformity at multiple thoracic and lumbar levels. 8. Aortic Atherosclerosis (ICD10-I70.0).   02/24/2021 Surgery   LAPAROTOMY EXPLORATORY resection of sigmoid colon and proximal rectum, takedown of colovesical fistula, creation of end ileosotmy by Dr Clovis Riley    Final Pathologic Diagnosis      A.  SIGMOID COLON AND RECTUM, RESECTION:               Invasive adenocarcinoma with mucinous features, moderately differentiated.               Tumor measures 3.2 cm in greatest dimension. Tumor invades visceral peritoneum.  Lymphovascular invasion is present.               Metastatic adenocarcinoma involving seven (of 22) lymph nodes (7/22).  One margin involved by adenocarcinoma (grossly presumed distal margin).     03/25/2021 Imaging   IMPRESSION: 1. New bilateral pulmonary nodules worrisome for new pulmonary metastatic disease. 2. New small right pleural effusion with overlying atelectasis. 3. Recurrent pleural nodularity on the right. 4. No findings for abdominal/pelvic metastatic disease. 5. Stable severe atrophy of the pancreatic body and tail with associated main pancreatic duct dilatation. No obvious obstructing pancreatic lesion but this was not present on the prior CT scan from 04/03/2020. It may be due to a stricture or a small ductal lesion, not well seen. MRI abdomen without and with contrast may be helpful for further evaluation of this finding. 6. Stable surgical changes involving the colon with a Hartmann's pouch and left lower quadrant colostomy. 7. Stable enlarged prostate gland. 8. Stable thoracic and  lumbar compression fractures.   07/07/2021 Imaging   CT AP  IMPRESSION: 1. Redemonstrated postoperative findings of right hemicolectomy and ileocolic anastomosis as well as Hartmann procedure sigmoid colon resection and left lower quadrant end colostomy. 2. No evidence of recurrent or metastatic disease in the abdomen or pelvis. 3. Multiple small pulmonary nodules are not significantly changed compared to prior examination. There is unchanged thickening and nodularity along the pleural surfaces of the right lung. Findings remain consistent with pulmonary parenchymal and pleural metastatic disease. 4. Small right pleural effusion and associated atelectasis or consolidation, slightly increased compared to prior examination. 5. Status post splenectomy and cholecystectomy. 6. Prostatomegaly. 7. Coronary artery disease.   Aortic Atherosclerosis (ICD10-I70.0).      CURRENT THERAPY:  Second line chemo FOLFIRI and bevacizumab q2weeks starting 09/18/20. Chemo on hold since 12/23/20 due to fistula. RESTART with low dose irinotecan alone on 04/03/21.  INTERVAL HISTORY:  Nathaniel Norton is here for a follow up of metastatic colon cancer. He was last seen by me on 06/25/21. He presents to the clinic alone. He reports he is feeling better overall. He notes he has been sleeping better recently, getting up less frequently at night to urinate. He  again notes the pump is uncomfortable and also causes sleep issues. He reports he has been trying to eat better. On our scale here, he has gained 3 pounds over the last two weeks.   All other systems were reviewed with the patient and are negative.  MEDICAL HISTORY:  Past Medical History:  Diagnosis Date   colon ca dx'd 03/2019    SURGICAL HISTORY: Past Surgical History:  Procedure Laterality Date   CATARACT EXTRACTION     IR IMAGING GUIDED PORT INSERTION  05/10/2019   L4 fracture  2012   RETINAL DETACHMENT SURGERY     Right hand surgery  1974    I  have reviewed the social history and family history with the patient and they are unchanged from previous note.  ALLERGIES:  is allergic to feraheme [ferumoxytol] and codeine.  MEDICATIONS:  Current Outpatient Medications  Medication Sig Dispense Refill   diphenoxylate-atropine (LOMOTIL) 2.5-0.025 MG tablet Take 1-2 tablets by mouth 4 (four) times daily as needed for diarrhea or loose stools. 90 tablet 1   fluticasone (FLONASE) 50 MCG/ACT nasal spray Place into the nose.     lidocaine-prilocaine (EMLA) cream Apply 1 application topically as needed. 30 g 1   Multiple Vitamin (MULTIVITAMIN) tablet Take 1 tablet by mouth daily.     MULTIPLE VITAMIN PO      ondansetron (ZOFRAN) 8 MG tablet Take 1 tablet (8 mg total) by mouth every 8 (eight) hours as needed for nausea or vomiting. Start on day 3 after chemotherapy 20 tablet 0   ondansetron (ZOFRAN-ODT) 4 MG disintegrating tablet Take 4 mg by mouth every 8 (eight) hours as needed.     oxyCODONE (OXY IR/ROXICODONE) 5 MG immediate release tablet Take 1 tablet (5 mg total) by mouth every 8 (eight) hours as needed for severe pain. 90 tablet 0   prochlorperazine (COMPAZINE) 10 MG tablet Take 1 tablet (10 mg total) by mouth every 6 (six) hours as needed (Nausea or vomiting). 30 tablet 2   sodium chloride flush 0.9 % SOLN injection      No current facility-administered medications for this visit.   Facility-Administered Medications Ordered in Other Visits  Medication Dose Route Frequency Provider Last Rate Last Admin   fluorouracil (ADRUCIL) 3,250 mg in sodium chloride 0.9 % 72.63 mL chemo infusion  2,000 mg/m2 (Treatment Plan Recorded) Intravenous 1 day or 1 dose Truitt Merle, MD   3,250 mg at 07/09/21 1406   sodium chloride flush (NS) 0.9 % injection 10 mL  10 mL Intracatheter PRN Truitt Merle, MD   10 mL at 05/30/21 1145   sodium chloride flush (NS) 0.9 % injection 10 mL  10 mL Intracatheter PRN Truitt Merle, MD        PHYSICAL EXAMINATION: ECOG PERFORMANCE  STATUS: 1 - Symptomatic but completely ambulatory  Vitals:   07/09/21 1019  BP: 117/78  Pulse: 71  Resp: 18  Temp: 98.2 F (36.8 C)  SpO2: 100%   Filed Weights   07/09/21 1019  Weight: 130 lb 14.4 oz (59.4 kg)    Due to COVID19 we will limit examination to appearance. Patient had no complaints.  GENERAL:alert, no distress and comfortable SKIN: skin color normal, no rashes or significant lesions EYES: normal, Conjunctiva are pink and non-injected, sclera clear  NEURO: alert & oriented x 3 with fluent speech  LABORATORY DATA:  I have reviewed the data as listed CBC Latest Ref Rng & Units 07/09/2021 06/25/2021 06/11/2021  WBC 4.0 - 10.5 K/uL 7.9 26.1(H)  17.1(H)  Hemoglobin 13.0 - 17.0 g/dL 11.0(L) 11.7(L) 11.0(L)  Hematocrit 39.0 - 52.0 % 32.5(L) 34.0(L) 32.6(L)  Platelets 150 - 400 K/uL 291 350 313     CMP Latest Ref Rng & Units 07/09/2021 06/25/2021 06/11/2021  Glucose 70 - 99 mg/dL 93 101(H) 105(H)  BUN 8 - 23 mg/dL _0 Creatinine 0.61 - 1.24 mg/dL 0.80 0.73 0.68  Sodium 135 - 145 mmol/L 141 141 140  Potassium 3.5 - 5.1 mmol/L 4.2 4.4 3.9  Chloride 98 - 111 mmol/L 105 103 105  CO2 22 - 32 mmol/L _1 Calcium 8.9 - 10.3 mg/dL 9.6 9.4 9.0  Total Protein 6.5 - 8.1 g/dL 6.6 6.7 6.6  Total Bilirubin 0.3 - 1.2 mg/dL 0.8 0.5 0.8  Alkaline Phos 38 - 126 U/L 85 134(H) 114  AST 15 - 41 U/L 17 26 14(L)  ALT 0 - 44 U/L _2 RADIOGRAPHIC STUDIES: I have personally reviewed the radiological images as listed and agreed with the findings in the report. No results found.   ASSESSMENT & PLAN:  Kendra Woolford is a 70 y.o. male with   1. Cancer of right colon, with peritoneal and lung metastasis, stage IV,  MMR normal, KRAS mutation (+), MSS -He was diagnosed in 03/2019. His Colonoscopy biopsy showed adenocarcinoma of right hepatic flexure colon. His peritoneal biopsy from 04/27/19 shows adenocarcinoma, consistent with metastasis of his colon cancer. This is now  stage IV disease. -He was treated with first-line FOLFOX in June-November 2020 before proceeding with HIPEC surgery by Dr. Clovis Riley on 12/17/19.  -Unfortunately he had disease progression on 09/10/20 PET with hypermetabolic pleural (right) and peritoneal nodules, which are consistent with metastatic disease. A biopsy was not felt to be necessary given his previously known peritoneal metastasis -He started second line FOLFIRI and bevacizumab q2weeks on 09/18/20, tolerated first 2 cycles poorly with abdominal pain/cramping and diarrhea.  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 Clovis Riley on 02/24/21.   -New baseline CT 03/25/21 showed new bilateral pulmonary small nodules, worrisome for new pulmonary metastatic disease.  No abdominal or pelvic metastatic disease -Resumed low-dose single agent irinotecan on 04/03/2021, added low-dose 5-FU back on 05/14/21.  He has overall tolerated well. -Restaging CT AP 07/07/21 showed no evidence of recurrent or metastatic disease in abdomen and pelvis, stable plural mets and small lung nodules, no other new lesions  -He is clinically stable. Lab reviewed, adequate for treatment, will proceed low-dose FOLFIRI today and continue every 2 weeks -We discussed switching to maintenance therapy, such as Xeloda or irinotecan single agent, likely after next scan    2. Symptom management: Nausea, vomiting, weight loss -overall much better  -weight is stable lately -continue f/u with dietician    3. 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 Watt Climes showed internal hemorrhoids, benign polyps, diverticulosis and  colon cancer. -He was treated with oral iron, IV Feraheme on 05/03/19 and 05/16/19 (had reaction with 2nd dose).  -With cancer recurrence his anemia recurred. Hgb went down when he restarted chemotherapy in 03/2021, recovering since 05/2021. Hgb 11.0 today (07/09/21)  4. Goal of care discussion -The patient understands the goal of care is palliative. -I previously recommended DNR, he will think about it      PLAN:  -lab and scan reviewed  -proceed with same low dose FOLFIRI today -Lab, flush, f/u and FOLFIRI in 2 and 4 weeks   No problem-specific Assessment & Plan notes found for this encounter.   No orders of the defined types were placed in this encounter.  All questions were answered. The patient knows to call the clinic with any problems, questions or concerns. No barriers to learning was detected. The total time spent in the appointment was 30 minutes.     Truitt Merle, MD 07/09/2021   I, Wilburn Mylar, am acting as scribe for Truitt Merle, MD.   I have reviewed the above documentation for accuracy and completeness, and I agree with the above.

## 2021-07-09 NOTE — Patient Instructions (Signed)
[facility]  Discharge Instructions: Thank you for choosing [facility] to provide your oncology and hematology care.   If you have a lab appointment with the [facility], please go directly to the [facility] and check in at the registration area.   Wear comfortable clothing and clothing appropriate for easy access to any Portacath or PICC line.   We strive to give you quality time with your provider. You may need to reschedule your appointment if you arrive late (15 or more minutes).  Arriving late affects you and other patients whose appointments are after yours.  Also, if you miss three or more appointments without notifying the office, you may be dismissed from the clinic at the provider's discretion.      For prescription refill requests, have your pharmacy contact our office and allow 72 hours for refills to be completed.    Today you received the following chemotherapy and/or immunotherapy agents FOLFIRI (Irinotecan - Camptosar, Leucovorin, 46hr Fluoruouracil).      To help prevent nausea and vomiting after your treatment, we encourage you to take your nausea medication as directed.  BELOW ARE SYMPTOMS THAT SHOULD BE REPORTED IMMEDIATELY: *FEVER GREATER THAN 100.4 F (38 C) OR HIGHER *CHILLS OR SWEATING *NAUSEA AND VOMITING THAT IS NOT CONTROLLED WITH YOUR NAUSEA MEDICATION *UNUSUAL SHORTNESS OF BREATH *UNUSUAL BRUISING OR BLEEDING *URINARY PROBLEMS (pain or burning when urinating, or frequent urination) *BOWEL PROBLEMS (unusual diarrhea, constipation, pain near the anus) TENDERNESS IN MOUTH AND THROAT WITH OR WITHOUT PRESENCE OF ULCERS (sore throat, sores in mouth, or a toothache) UNUSUAL RASH, SWELLING OR PAIN  UNUSUAL VAGINAL DISCHARGE OR ITCHING   Items with * indicate a potential emergency and should be followed up as soon as possible or go to the Emergency Department if any problems should occur.  Please show the CHEMOTHERAPY  ALERT CARD or IMMUNOTHERAPY ALERT CARD at check-in to the Emergency Department and triage nurse.  Should you have questions after your visit or need to cancel or reschedule your appointment, please contact [facility]  Dept: [phone]  and follow the prompts.  Office hours are 8:00 a.m. to 4:30 p.m. Monday - Friday. Please note that voicemails left after 4:00 p.m. may not be returned until the following business day.  We are closed weekends and major holidays. You have access to a nurse at all times for urgent questions. Please call the main number to the clinic Dept: [phone] and follow the prompts.   For any non-urgent questions, you may also contact your provider using MyChart. We now offer e-Visits for anyone 72 and older to request care online for non-urgent symptoms. For details visit [email or web address].   Also download the MyChart app! Go to the app store, search "MyChart", open the app, select [facility], and log in with your MyChart username and password.  Due to Covid, a mask is required upon entering the hospital/clinic. If you do not have a mask, one will be given to you upon arrival. For doctor visits, patients may have 1 support person aged 72 or older with them. For treatment visits, patients cannot have anyone with them due to current Covid guidelines and our immunocompromised population.

## 2021-07-11 ENCOUNTER — Other Ambulatory Visit: Payer: Self-pay

## 2021-07-11 ENCOUNTER — Inpatient Hospital Stay: Payer: Medicare Other

## 2021-07-11 VITALS — BP 120/77 | HR 84 | Temp 98.4°F | Resp 18 | Ht 66.0 in

## 2021-07-11 DIAGNOSIS — Z5111 Encounter for antineoplastic chemotherapy: Secondary | ICD-10-CM | POA: Diagnosis not present

## 2021-07-11 DIAGNOSIS — Z933 Colostomy status: Secondary | ICD-10-CM | POA: Diagnosis not present

## 2021-07-11 DIAGNOSIS — C182 Malignant neoplasm of ascending colon: Secondary | ICD-10-CM

## 2021-07-11 DIAGNOSIS — Z9221 Personal history of antineoplastic chemotherapy: Secondary | ICD-10-CM | POA: Diagnosis not present

## 2021-07-11 DIAGNOSIS — C786 Secondary malignant neoplasm of retroperitoneum and peritoneum: Secondary | ICD-10-CM | POA: Diagnosis not present

## 2021-07-11 DIAGNOSIS — D500 Iron deficiency anemia secondary to blood loss (chronic): Secondary | ICD-10-CM | POA: Diagnosis not present

## 2021-07-11 DIAGNOSIS — Z5189 Encounter for other specified aftercare: Secondary | ICD-10-CM | POA: Diagnosis not present

## 2021-07-11 MED ORDER — HEPARIN SOD (PORK) LOCK FLUSH 100 UNIT/ML IV SOLN
500.0000 [IU] | Freq: Once | INTRAVENOUS | Status: AC | PRN
  Administered 2021-07-11: 500 [IU]
  Filled 2021-07-11: qty 5

## 2021-07-11 MED ORDER — SODIUM CHLORIDE 0.9% FLUSH
10.0000 mL | INTRAVENOUS | Status: DC | PRN
  Administered 2021-07-11: 10 mL
  Filled 2021-07-11: qty 10

## 2021-07-13 DIAGNOSIS — Z85038 Personal history of other malignant neoplasm of large intestine: Secondary | ICD-10-CM | POA: Diagnosis not present

## 2021-07-13 DIAGNOSIS — Z933 Colostomy status: Secondary | ICD-10-CM | POA: Diagnosis not present

## 2021-07-14 DIAGNOSIS — C182 Malignant neoplasm of ascending colon: Secondary | ICD-10-CM | POA: Diagnosis not present

## 2021-07-23 ENCOUNTER — Inpatient Hospital Stay: Payer: Medicare Other

## 2021-07-23 ENCOUNTER — Inpatient Hospital Stay (HOSPITAL_BASED_OUTPATIENT_CLINIC_OR_DEPARTMENT_OTHER): Payer: Medicare Other | Admitting: Hematology

## 2021-07-23 ENCOUNTER — Inpatient Hospital Stay: Payer: Medicare Other | Attending: Hematology

## 2021-07-23 ENCOUNTER — Other Ambulatory Visit: Payer: Self-pay

## 2021-07-23 ENCOUNTER — Encounter: Payer: Self-pay | Admitting: Hematology

## 2021-07-23 VITALS — BP 120/74 | HR 67 | Temp 98.2°F | Resp 17 | Ht 66.0 in | Wt 128.0 lb

## 2021-07-23 DIAGNOSIS — D500 Iron deficiency anemia secondary to blood loss (chronic): Secondary | ICD-10-CM

## 2021-07-23 DIAGNOSIS — K5900 Constipation, unspecified: Secondary | ICD-10-CM | POA: Insufficient documentation

## 2021-07-23 DIAGNOSIS — Z933 Colostomy status: Secondary | ICD-10-CM | POA: Diagnosis not present

## 2021-07-23 DIAGNOSIS — Z9221 Personal history of antineoplastic chemotherapy: Secondary | ICD-10-CM | POA: Diagnosis not present

## 2021-07-23 DIAGNOSIS — Z5111 Encounter for antineoplastic chemotherapy: Secondary | ICD-10-CM | POA: Diagnosis not present

## 2021-07-23 DIAGNOSIS — R112 Nausea with vomiting, unspecified: Secondary | ICD-10-CM | POA: Insufficient documentation

## 2021-07-23 DIAGNOSIS — C182 Malignant neoplasm of ascending colon: Secondary | ICD-10-CM | POA: Diagnosis not present

## 2021-07-23 DIAGNOSIS — Z95828 Presence of other vascular implants and grafts: Secondary | ICD-10-CM

## 2021-07-23 DIAGNOSIS — D630 Anemia in neoplastic disease: Secondary | ICD-10-CM | POA: Diagnosis not present

## 2021-07-23 DIAGNOSIS — C786 Secondary malignant neoplasm of retroperitoneum and peritoneum: Secondary | ICD-10-CM | POA: Insufficient documentation

## 2021-07-23 LAB — CMP (CANCER CENTER ONLY)
ALT: 27 U/L (ref 0–44)
AST: 25 U/L (ref 15–41)
Albumin: 3.5 g/dL (ref 3.5–5.0)
Alkaline Phosphatase: 88 U/L (ref 38–126)
Anion gap: 7 (ref 5–15)
BUN: 13 mg/dL (ref 8–23)
CO2: 27 mmol/L (ref 22–32)
Calcium: 9.2 mg/dL (ref 8.9–10.3)
Chloride: 106 mmol/L (ref 98–111)
Creatinine: 0.8 mg/dL (ref 0.61–1.24)
GFR, Estimated: >60 mL/min (ref >60)
Glucose, Bld: 99 mg/dL (ref 70–99)
Potassium: 4.1 mmol/L (ref 3.5–5.1)
Sodium: 140 mmol/L (ref 135–145)
Total Bilirubin: 0.5 mg/dL (ref 0.3–1.2)
Total Protein: 6.6 g/dL (ref 6.5–8.1)

## 2021-07-23 LAB — CBC WITH DIFFERENTIAL (CANCER CENTER ONLY)
Abs Immature Granulocytes: 0.02 10*3/uL (ref 0.00–0.07)
Basophils Absolute: 0 10*3/uL (ref 0.0–0.1)
Basophils Relative: 0 %
Eosinophils Absolute: 0.1 10*3/uL (ref 0.0–0.5)
Eosinophils Relative: 1 %
HCT: 33.3 % — ABNORMAL LOW (ref 39.0–52.0)
Hemoglobin: 11.2 g/dL — ABNORMAL LOW (ref 13.0–17.0)
Immature Granulocytes: 0 %
Lymphocytes Relative: 31 %
Lymphs Abs: 2.3 10*3/uL (ref 0.7–4.0)
MCH: 29.4 pg (ref 26.0–34.0)
MCHC: 33.6 g/dL (ref 30.0–36.0)
MCV: 87.4 fL (ref 80.0–100.0)
Monocytes Absolute: 0.8 10*3/uL (ref 0.1–1.0)
Monocytes Relative: 11 %
Neutro Abs: 4.2 10*3/uL (ref 1.7–7.7)
Neutrophils Relative %: 57 %
Platelet Count: 273 10*3/uL (ref 150–400)
RBC: 3.81 MIL/uL — ABNORMAL LOW (ref 4.22–5.81)
RDW: 18.2 % — ABNORMAL HIGH (ref 11.5–15.5)
WBC Count: 7.5 10*3/uL (ref 4.0–10.5)
nRBC: 0 % (ref 0.0–0.2)

## 2021-07-23 LAB — FERRITIN: Ferritin: 104 ng/mL (ref 24–336)

## 2021-07-23 LAB — IRON AND TIBC
Iron: 41 ug/dL — ABNORMAL LOW (ref 42–163)
Saturation Ratios: 15 % — ABNORMAL LOW (ref 20–55)
TIBC: 285 ug/dL (ref 202–409)
UIBC: 244 ug/dL (ref 117–376)

## 2021-07-23 LAB — CEA (IN HOUSE-CHCC): CEA (CHCC-In House): 5.06 ng/mL — ABNORMAL HIGH (ref 0.00–5.00)

## 2021-07-23 MED ORDER — SODIUM CHLORIDE 0.9% FLUSH
10.0000 mL | INTRAVENOUS | Status: DC | PRN
  Administered 2021-07-23: 10 mL
  Filled 2021-07-23: qty 10

## 2021-07-23 MED ORDER — SODIUM CHLORIDE 0.9 % IV SOLN
2000.0000 mg/m2 | INTRAVENOUS | Status: DC
  Administered 2021-07-23: 3250 mg via INTRAVENOUS
  Filled 2021-07-23: qty 65

## 2021-07-23 MED ORDER — PALONOSETRON HCL INJECTION 0.25 MG/5ML
0.2500 mg | Freq: Once | INTRAVENOUS | Status: AC
  Administered 2021-07-23: 0.25 mg via INTRAVENOUS
  Filled 2021-07-23: qty 5

## 2021-07-23 MED ORDER — SODIUM CHLORIDE 0.9 % IV SOLN
100.0000 mg/m2 | Freq: Once | INTRAVENOUS | Status: AC
  Administered 2021-07-23: 160 mg via INTRAVENOUS
  Filled 2021-07-23: qty 8

## 2021-07-23 MED ORDER — SODIUM CHLORIDE 0.9 % IV SOLN
10.0000 mg | Freq: Once | INTRAVENOUS | Status: AC
  Administered 2021-07-23: 10 mg via INTRAVENOUS
  Filled 2021-07-23: qty 10

## 2021-07-23 MED ORDER — ATROPINE SULFATE 1 MG/ML IJ SOLN
0.5000 mg | Freq: Once | INTRAMUSCULAR | Status: AC | PRN
  Administered 2021-07-23: 0.5 mg via INTRAVENOUS
  Filled 2021-07-23: qty 1

## 2021-07-23 MED ORDER — SODIUM CHLORIDE 0.9 % IV SOLN
Freq: Once | INTRAVENOUS | Status: AC
  Filled 2021-07-23: qty 250

## 2021-07-23 MED ORDER — SODIUM CHLORIDE 0.9 % IV SOLN
400.0000 mg/m2 | Freq: Once | INTRAVENOUS | Status: AC
  Administered 2021-07-23: 652 mg via INTRAVENOUS
  Filled 2021-07-23: qty 32.6

## 2021-07-23 NOTE — Patient Instructions (Signed)
[facility] ONCOL[facility]  Discharge Instructions: Thank you for choosing [facility] to provide your oncology and hematology care.   If you have a lab appointment with the [facility], please go directly to the [facility] and check in at the registration area.   Wear comfortable clothing and clothing appropriate for easy access to any Portacath or PICC line.   We strive to give you quality time with your provider. You may need to reschedule your appointment if you arrive late (15 or more minutes).  Arriving late affects you and other patients whose appointments are after yours.  Also, if you miss three or more appointments without notifying the office, you may be dismissed from the clinic at the provider's discretion.      For prescription refill requests, have your pharmacy contact our office and allow 72 hours for refills to be completed.    Today you received the following chemotherapy and/or immunotherapy agents FOLFIRI (Irinotecan - Camptosar, Leucovorin, 46hr Fluoruouracil).      To help prevent nausea and vomiting after your treatment, we encourage you to take your nausea medication as directed.  BELOW ARE SYMPTOMS THAT SHOULD BE REPORTED IMMEDIATELY: *FEVER GREATER THAN 100.4 F (38 C) OR HIGHER *CHILLS OR SWEATING *NAUSEA AND VOMITING THAT IS NOT CONTROLLED WITH YOUR NAUSEA MEDICATION *UNUSUAL SHORTNESS OF BREATH *UNUSUAL BRUISING OR BLEEDING *URINARY PROBLEMS (pain or burning when urinating, or frequent urination) *BOWEL PROBLEMS (unusual diarrhea, constipation, pain near the anus) TENDERNESS IN MOUTH AND THROAT WITH OR WITHOUT PRESENCE OF ULCERS (sore throat, sores in mouth, or a toothache) UNUSUAL RASH, SWELLING OR PAIN  UNUSUAL VAGINAL DISCHARGE OR ITCHING   Items with * indicate a potential emergency and should be followed up as soon as possible or go to the Emergency Department if any problems should occur.  Please show the CHEMOTHERAPY  ALERT CARD or IMMUNOTHERAPY ALERT CARD at check-in to the Emergency Department and triage nurse.  Should you have questions after your visit or need to cancel or reschedule your appointment, please contact [facility]  Dept: [phone]  and follow the prompts.  Office hours are 8:00 a.m. to 4:30 p.m. Monday - Friday. Please note that voicemails left after 4:00 p.m. may not be returned until the following business day.  We are closed weekends and major holidays. You have access to a nurse at all times for urgent questions. Please call the main number to the clinic Dept: [phone] and follow the prompts.   For any non-urgent questions, you may also contact your provider using MyChart. We now offer e-Visits for anyone 21 and older to request care online for non-urgent symptoms. For details visit [email or web address].   Also download the MyChart app! Go to the app store, search "MyChart", open the app, select [facility], and log in with your MyChart username and password.  Due to Covid, a mask is required upon entering the hospital/clinic. If you do not have a mask, one will be given to you upon arrival. For doctor visits, patients may have 1 support person aged 7 or older with them. For treatment visits, patients cannot have anyone with them due to current Covid guidelines and our immunocompromised population.

## 2021-07-23 NOTE — Progress Notes (Signed)
[facility]   [phone] [phone]   Clinic Follow up Note   Patient Care Team: Lajean Manes, MD as PCP - General (Internal Medicine) Berle Mull, MD as Consulting Physician (Family Medicine) Clarene Essex, MD as Consulting Physician (Gastroenterology) Truitt Merle, MD as Consulting Physician (Oncology)  Date of Service:  07/23/2021  CHIEF COMPLAINT: f/u of metastatic colon cancer  SUMMARY OF ONCOLOGIC HISTORY: Oncology History Overview Note  Cancer Staging Cancer of right colon [facility]) Staging form: Colon and Rectum, AJCC 8th Edition - Clinical stage from 04/09/2019: Stage IVC (cTX, cNX, pM1c) - Signed by Truitt Merle, MD on 05/03/2019     Cancer of right colon [facility])  04/09/2019 Procedure   Colonoscopy 04/09/19 by Dr Watt Climes IMPRESSION -internal hemorrhoids -Diverticulosis in the sigmoid colon  -2 small polyps in the rectum and in the proximal transverse colon, removed with a hot snare. Resected and retrieved.  -3 medium polyps in the proximal transverse colon, in the mid transverse colon and in the distal transverse colon, removed and resected and retrieved.  -likely malignant partially obstructing tumor at the hepatic flexure. biopsied, tattooed.  -1 large polyp in the mid ascending colon  -the examination was otherwise normal     04/09/2019 Initial Biopsy   FINAL MICROSCOPIC DIAGNOSIS: 04/09/19 1. LG intestine-hepatic flexure, Biopsy:   INVASIVE WELL DIFFERENTIATED ADENOCARCINOMA   04/09/2019 Cancer Staging   Staging form: Colon and Rectum, AJCC 8th Edition - Clinical stage from 04/09/2019: Stage IVC (cTX, cNX, pM1c) - Signed by Truitt Merle, MD on 05/03/2019   04/10/2019 Imaging   CT AP 04/10/19  IMPRESSION: 1. There is an eccentric mass of the colon involving the ascending colon near the hepatic flexure measuring approximately 3.4 x 3.4 by 2.0 cm (series 2, image 37, series 3, image 37). There is extensive soft tissue nodularity of the  mesocolon and omentum, and likely areas of the peritoneum, for example bilateral upper quadrants (series 2, image 25). Findings are consistent with primary colon malignancy, probable omental and peritoneal involvement, and small volume malignant ascites. 2.  Other chronic and incidental findings as detailed above.   04/20/2019 Initial Diagnosis   Cancer of right colon ([facility])   04/26/2019 Imaging   CT Chest 04/26/19 IMPRESSION: 1. Borderline to mild lower thoracic adenopathy, including within the right internal mammary and juxta cardiophrenic stations. Given the appearance of the upper abdomen, suspicious for nodal metastasis. 2. A low right paratracheal node is borderline sized, but favored to be reactive. 3. No evidence of pulmonary metastasis. 4. Peritoneal metastasis and abdominal ascites, as before. 5. Pancreatic parenchymal calcifications indicative of chronic calcific pancreatitis. 6. Coronary artery atherosclerosis.   04/27/2019 Pathology Results   Diagnosis 04/27/19 Peritoneum, biopsy, right upper quadrant, perihepatic - ADENOCARCINOMA, CONSISTENT WITH COLONIC PRIMARY. - SEE COMMENT.   05/16/2019 - 10/31/2019 Chemotherapy   First line FOLFOX every 2 weeks with Avastin starting with cycle 2 starting 05/16/19. 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 Stopped after 10/31/19 for surgery.    07/23/2019 Imaging    CT AP W Contrast  IMPRESSION: 1. Primary ascending colon mass may be minimally smaller. Peritoneal metastatic disease appears slightly improved as well. 2. Chronic calcific pancreatitis. 3. Tiny left renal stone. 4. Small left inguinal hernia contains a knuckle of unobstructed colon. 5. Enlarged prostate.   10/29/2019 Imaging   CT AP W Contrast IMPRESSION:  No significant change in small ascending colon  soft tissue mass and diffuse omental carcinomatosis.   No new or progressive metastatic disease identified.   Stable small left inguinal hernia containing a loop of sigmoid colon. No evidence of bowel obstruction or strangulation.   Colonic diverticulosis. No radiographic evidence of diverticulitis.   Stable enlarged prostate.   12/17/2019 Pathology Results   HIPEC Surgery by Dr Clovis Riley 12/17/19 FINAL PATHOLOGIC DIAGNOSIS  MICROSCOPIC EXAMINATION AND DIAGNOSIS  A.          "RIGHT COLON, OMENTUM, SPLEEN":       Invasive adenocarcinoma with mucinous features, moderately differentiated.  Tumor involves mesentery and spleen.  Five out of ten lymph nodes involved by adenocarcinoma with perinodal soft tissue involvement (5/10), see comment.  Margins are uninvolved by invasive carcinoma.  Ileum and appendix, uninvolved.  See comment and cancer case summary.  B.          "ROUND LIGAMENT OF LIVER":       Involved by adenocarcinoma with mucinous features, moderately differentiated.  C.          "RIGHT DIAPHRAM STRIPPING":       Negative for carcinoma.  D.          "RIGHT HEPATIC CAPSULECTOMY":       Chronic inflammation and fibrosis.  Negative for carcinoma.  E.          "DEBRIDED TUMOR":       Involved by adenocarcinoma with mucinous features, moderately differentiated.   COMMENT: Sections disclose five out of ten lymph nodes involved by adenocarcinoma with perinodal soft tissue involvement. However, it is not possible to be certain if this represents lymph nodes replaced by tumor and/or soft tissue involvement. In addition, focal perineural invasion is seen.   04/03/2020 Imaging   CT AP W contrast  IMPRESSION: 1. Status post interval splenectomy and right hemicolectomy. 2. Response to therapy of omental/peritoneal metastasis. The only residual indeterminate finding is fluid density along the capsule of the hepatic dome which could be postoperative or secondary to localized  residual peritoneal disease. 3. Left nephrolithiasis. 4.  Possible constipation. 5. Prostatomegaly. 6.  Aortic Atherosclerosis (ICD10-I70.0).   07/11/2020 Imaging   CT AP  1.  Postsurgical changes of subtle reductive surgery including right hemicolectomy, omentectomy, splenectomy, and right hepatic capsulectomy.  2.  Interval development of multiple areas of low attenuation involving the liver, as described above. This the intraparenchymal low-density lesion is concerning for metastatic disease. Other disease along the capsule may be post therapy related. Residual thickening of the anterior peritoneum particularly anterior to the right lobe of the liver is also concerning for some residual disease although appears significantly improved. Recommend attention on follow-up.  3.  No definite evidence of residual peritoneal disease status post HIPEC.   09/18/2020 -  Chemotherapy   Second line chemo FOLFIRI and bevacizumab q2weeks starting 09/18/20. Chemo on hold since 12/23/20 due to fistula. RESTART with low dose irinotecan alone on 04/03/21.   12/19/2020 Imaging   CT CAP  IMPRESSION: 1. Interval development of a 1.6 x 2.7 x 1.6 cm collection of gas and debris between the sigmoid colon and dome of bladder, compatible with small abscess. A portion of this abscess is probably intramural at the dome of bladder. Marked associated thickening of the adjacent colonic and bladder walls with tiny gas bubbles in the thickened bladder wall and prominent amount of free gas in the bladder lumen suggests an associated colovesical fistula. Tiny gas bubbles are also  seen in the central prostate gland and may be in the urethra although parenchymal gas within the prostate gland is not excluded on this exam. 2. Nodularity along the major and minor fissures of the right hemithorax has decreased in the interval, nearly imperceptible today. 3. Peritoneal implants along the liver, right abdomen, and deep to the left  paramidline anterior abdominal wall have resolved in the interval by CT imaging. 4. Tiny right lower lobe pulmonary nodule is more conspicuous today. Attention on follow-up recommended. 5. Stable 4 mm hypodensity in the dome of the right liver, too small to characterize. 6. Nonobstructing left renal stones with bilateral renal cysts. 7. Stable compression deformity at multiple thoracic and lumbar levels. 8. Aortic Atherosclerosis (ICD10-I70.0).   02/24/2021 Surgery   LAPAROTOMY EXPLORATORY resection of sigmoid colon and proximal rectum, takedown of colovesical fistula, creation of end ileosotmy by Dr Clovis Riley    Final Pathologic Diagnosis      A.  SIGMOID COLON AND RECTUM, RESECTION:               Invasive adenocarcinoma with mucinous features, moderately differentiated.               Tumor measures 3.2 cm in greatest dimension. Tumor invades visceral peritoneum.  Lymphovascular invasion is present.               Metastatic adenocarcinoma involving seven (of 22) lymph nodes (7/22).  One margin involved by adenocarcinoma (grossly presumed distal margin).     03/25/2021 Imaging   IMPRESSION: 1. New bilateral pulmonary nodules worrisome for new pulmonary metastatic disease. 2. New small right pleural effusion with overlying atelectasis. 3. Recurrent pleural nodularity on the right. 4. No findings for abdominal/pelvic metastatic disease. 5. Stable severe atrophy of the pancreatic body and tail with associated main pancreatic duct dilatation. No obvious obstructing pancreatic lesion but this was not present on the prior CT scan from 04/03/2020. It may be due to a stricture or a small ductal lesion, not well seen. MRI abdomen without and with contrast may be helpful for further evaluation of this finding. 6. Stable surgical changes involving the colon with a Hartmann's pouch and left lower quadrant colostomy. 7. Stable enlarged prostate gland. 8. Stable thoracic and lumbar compression  fractures.   07/07/2021 Imaging   CT AP  IMPRESSION: 1. Redemonstrated postoperative findings of right hemicolectomy and ileocolic anastomosis as well as Hartmann procedure sigmoid colon resection and left lower quadrant end colostomy. 2. No evidence of recurrent or metastatic disease in the abdomen or pelvis. 3. Multiple small pulmonary nodules are not significantly changed compared to prior examination. There is unchanged thickening and nodularity along the pleural surfaces of the right lung. Findings remain consistent with pulmonary parenchymal and pleural metastatic disease. 4. Small right pleural effusion and associated atelectasis or consolidation, slightly increased compared to prior examination. 5. Status post splenectomy and cholecystectomy. 6. Prostatomegaly. 7. Coronary artery disease.   Aortic Atherosclerosis (ICD10-I70.0).      CURRENT THERAPY:  Second line chemo FOLFIRI and bevacizumab q2weeks starting 09/18/20. Chemo on hold since 12/23/20 due to fistula. RESTART with low dose irinotecan alone on 04/03/21.  INTERVAL HISTORY:  Nathaniel Norton is here for a follow up of metastatic colon cancer. He was last seen by me on 07/09/21. He presents to the clinic alone. He reports he didn't tolerate his last cycle as well as the previous. He reports cramping and some vomiting.   All other systems were reviewed with the patient and are negative.  MEDICAL HISTORY:  Past Medical History:  Diagnosis Date   colon ca dx'd 03/2019    SURGICAL HISTORY: Past Surgical History:  Procedure Laterality Date   CATARACT EXTRACTION     IR IMAGING GUIDED PORT INSERTION  05/10/2019   L4 fracture  2012   RETINAL DETACHMENT SURGERY     Right hand surgery  1974    I have reviewed the social history and family history with the patient and they are unchanged from previous note.  ALLERGIES:  is allergic to feraheme [ferumoxytol] and codeine.  MEDICATIONS:  Current Outpatient Medications   Medication Sig Dispense Refill   diphenoxylate-atropine (LOMOTIL) 2.5-0.025 MG tablet Take 1-2 tablets by mouth 4 (four) times daily as needed for diarrhea or loose stools. 90 tablet 1   fluticasone (FLONASE) 50 MCG/ACT nasal spray Place into the nose.     lidocaine-prilocaine (EMLA) cream Apply 1 application topically as needed. 30 g 1   Multiple Vitamin (MULTIVITAMIN) tablet Take 1 tablet by mouth daily.     MULTIPLE VITAMIN PO      ondansetron (ZOFRAN) 8 MG tablet Take 1 tablet (8 mg total) by mouth every 8 (eight) hours as needed for nausea or vomiting. Start on day 3 after chemotherapy 20 tablet 0   ondansetron (ZOFRAN-ODT) 4 MG disintegrating tablet Take 4 mg by mouth every 8 (eight) hours as needed.     oxyCODONE (OXY IR/ROXICODONE) 5 MG immediate release tablet Take 1 tablet (5 mg total) by mouth every 8 (eight) hours as needed for severe pain. 90 tablet 0   prochlorperazine (COMPAZINE) 10 MG tablet Take 1 tablet (10 mg total) by mouth every 6 (six) hours as needed (Nausea or vomiting). 30 tablet 2   sodium chloride flush 0.9 % SOLN injection      No current facility-administered medications for this visit.   Facility-Administered Medications Ordered in Other Visits  Medication Dose Route Frequency Provider Last Rate Last Admin   atropine injection 0.5 mg  0.5 mg Intravenous Once PRN Truitt Merle, MD       dexamethasone (DECADRON) 10 mg in sodium chloride 0.9 % 50 mL IVPB  10 mg Intravenous Once Truitt Merle, MD 204 mL/hr at 07/23/21 1058 10 mg at 07/23/21 1058   fluorouracil (ADRUCIL) 3,250 mg in sodium chloride 0.9 % 85 mL chemo infusion  2,000 mg/m2 (Treatment Plan Recorded) Intravenous 1 day or 1 dose Truitt Merle, MD       irinotecan (CAMPTOSAR) 160 mg in sodium chloride 0.9 % 500 mL chemo infusion  100 mg/m2 (Treatment Plan Recorded) Intravenous Once Truitt Merle, MD       leucovorin 652 mg in sodium chloride 0.9 % 250 mL infusion  400 mg/m2 (Treatment Plan Recorded) Intravenous Once Truitt Merle, MD       sodium chloride flush (NS) 0.9 % injection 10 mL  10 mL Intracatheter PRN Truitt Merle, MD   10 mL at 05/30/21 1145    PHYSICAL EXAMINATION: ECOG PERFORMANCE STATUS: 1 - Symptomatic but completely ambulatory  Vitals:   07/23/21 0952  BP: 120/74  Pulse: 67  Resp: 17  Temp: 98.2 F (36.8 C)  SpO2: 100%   Wt Readings from Last 3 Encounters:  07/23/21 128 lb (58.1 kg)  07/09/21 130 lb 14.4 oz (59.4 kg)  06/25/21 127 lb 4.8 oz (57.7 kg)     GENERAL:alert, no distress and comfortable SKIN: skin color normal, no rashes or significant lesions EYES: normal, Conjunctiva are pink and non-injected, sclera clear  NEURO:  alert & oriented x 3 with fluent speech  LABORATORY DATA:  I have reviewed the data as listed CBC Latest Ref Rng & Units 07/23/2021 07/09/2021 06/25/2021  WBC 4.0 - 10.5 K/uL 7.5 7.9 26.1(H)  Hemoglobin 13.0 - 17.0 g/dL 11.2(L) 11.0(L) 11.7(L)  Hematocrit 39.0 - 52.0 % 33.3(L) 32.5(L) 34.0(L)  Platelets 150 - 400 K/uL 273 291 350     CMP Latest Ref Rng & Units 07/23/2021 07/09/2021 06/25/2021  Glucose 70 - 99 mg/dL 99 93 101(H)  BUN 8 - 23 mg/dL _0 Creatinine 0.61 - 1.24 mg/dL 0.80 0.80 0.73  Sodium 135 - 145 mmol/L 140 141 141  Potassium 3.5 - 5.1 mmol/L 4.1 4.2 4.4  Chloride 98 - 111 mmol/L 106 105 103  CO2 22 - 32 mmol/L _1 Calcium 8.9 - 10.3 mg/dL 9.2 9.6 9.4  Total Protein 6.5 - 8.1 g/dL 6.6 6.6 6.7  Total Bilirubin 0.3 - 1.2 mg/dL 0.5 0.8 0.5  Alkaline Phos 38 - 126 U/L 88 85 134(H)  AST 15 - 41 U/L _2 ALT 0 - 44 U/L _3 RADIOGRAPHIC STUDIES: I have personally reviewed the radiological images as listed and agreed with the findings in the report. No results found.   ASSESSMENT & PLAN:  Ward Boissonneault is a 70 y.o. male with   1. Cancer of right colon, with peritoneal and lung metastasis, stage IV,  MMR normal, KRAS mutation (+), MSS -He was diagnosed in 03/2019. His Colonoscopy biopsy showed adenocarcinoma of  right hepatic flexure colon. His peritoneal biopsy from 04/27/19 shows adenocarcinoma, consistent with metastasis of his colon cancer. This is now stage IV disease. -He was treated with first-line FOLFOX in June-November 2020 before proceeding with HIPEC surgery by Dr. Clovis Riley on 12/17/19.  -09/10/20 PET showed disease progression with hypermetabolic pleural (right) and peritoneal nodules. A biopsy was not felt to be necessary given his previously known peritoneal metastasis -He started second line FOLFIRI and bevacizumab q2weeks on 09/18/20, tolerated first 2 cycles poorly with abdominal pain/cramping and diarrhea.  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 Clovis Riley on 02/24/21.   -Resumed low-dose single agent irinotecan on 04/03/2021, added low-dose 5-FU back on 05/14/21.  He has overall tolerated well. -Restaging CT AP 07/07/21 showed no evidence of recurrent or metastatic disease in abdomen and pelvis, stable plural mets and small lung nodules, no other new lesions  -He is clinically stable. Lab reviewed, adequate for treatment, will proceed low-dose FOLFIRI today and continue every 2 weeks -We discussed switching to maintenance therapy, such as Xeloda or irinotecan single agent, likely after next scan    2. Symptom management: Nausea, vomiting, weight loss, constipation -weight is stable lately -continue f/u with dietician  -He has had worsening constipation after chemo lately which causes nausea and vomiting  -I recommended he start Colace instead of taking Miralax or a laxative. He will try this.   3. 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 Watt Climes showed internal  hemorrhoids, benign polyps, diverticulosis and colon cancer. -He was treated with oral iron, IV Feraheme on 05/03/19 and 05/16/19 (had reaction with 2nd dose).  -  With cancer recurrence his anemia recurred. Hgb went down when he restarted chemotherapy in 03/2021, recovering since 05/2021. Hgb stable in 11 range, 11.2 today (07/09/21)   4. Goal of care discussion -The patient understands the goal of care is palliative. -I previously recommended DNR, he will think about it      PLAN:  -lab reviewed  -proceed with same low dose FOLFIRI today -Lab, flush, f/u and FOLFIRI in 2 and 4 weeks -we discussed management of constipation    No problem-specific Assessment & Plan notes found for this encounter.   No orders of the defined types were placed in this encounter.  All questions were answered. The patient knows to call the clinic with any problems, questions or concerns. No barriers to learning was detected. The total time spent in the appointment was 30 minutes.     Truitt Merle, MD 07/23/2021   I, Wilburn Mylar, am acting as scribe for Truitt Merle, MD.   I have reviewed the above documentation for accuracy and completeness, and I agree with the above.

## 2021-07-23 NOTE — Patient Instructions (Signed)

## 2021-07-25 ENCOUNTER — Inpatient Hospital Stay: Payer: Medicare Other

## 2021-07-25 ENCOUNTER — Other Ambulatory Visit: Payer: Self-pay

## 2021-07-25 VITALS — BP 120/69 | HR 67 | Temp 97.8°F | Resp 18

## 2021-07-25 DIAGNOSIS — C182 Malignant neoplasm of ascending colon: Secondary | ICD-10-CM | POA: Diagnosis not present

## 2021-07-25 DIAGNOSIS — K5900 Constipation, unspecified: Secondary | ICD-10-CM | POA: Diagnosis not present

## 2021-07-25 DIAGNOSIS — R112 Nausea with vomiting, unspecified: Secondary | ICD-10-CM | POA: Diagnosis not present

## 2021-07-25 DIAGNOSIS — Z5111 Encounter for antineoplastic chemotherapy: Secondary | ICD-10-CM | POA: Diagnosis not present

## 2021-07-25 DIAGNOSIS — Z933 Colostomy status: Secondary | ICD-10-CM | POA: Diagnosis not present

## 2021-07-25 DIAGNOSIS — Z9221 Personal history of antineoplastic chemotherapy: Secondary | ICD-10-CM | POA: Diagnosis not present

## 2021-07-25 DIAGNOSIS — D630 Anemia in neoplastic disease: Secondary | ICD-10-CM | POA: Diagnosis not present

## 2021-07-25 DIAGNOSIS — C786 Secondary malignant neoplasm of retroperitoneum and peritoneum: Secondary | ICD-10-CM | POA: Diagnosis not present

## 2021-07-25 MED ORDER — SODIUM CHLORIDE 0.9% FLUSH
10.0000 mL | INTRAVENOUS | Status: DC | PRN
  Administered 2021-07-25: 10 mL
  Filled 2021-07-25: qty 10

## 2021-07-25 MED ORDER — HEPARIN SOD (PORK) LOCK FLUSH 100 UNIT/ML IV SOLN
500.0000 [IU] | Freq: Once | INTRAVENOUS | Status: AC | PRN
  Administered 2021-07-25: 500 [IU]
  Filled 2021-07-25: qty 5

## 2021-07-30 DIAGNOSIS — Z933 Colostomy status: Secondary | ICD-10-CM | POA: Diagnosis not present

## 2021-07-30 DIAGNOSIS — Z85038 Personal history of other malignant neoplasm of large intestine: Secondary | ICD-10-CM | POA: Diagnosis not present

## 2021-08-06 ENCOUNTER — Inpatient Hospital Stay: Payer: Medicare Other

## 2021-08-06 ENCOUNTER — Other Ambulatory Visit: Payer: Self-pay

## 2021-08-06 ENCOUNTER — Encounter: Payer: Self-pay | Admitting: Hematology

## 2021-08-06 ENCOUNTER — Inpatient Hospital Stay (HOSPITAL_BASED_OUTPATIENT_CLINIC_OR_DEPARTMENT_OTHER): Payer: Medicare Other | Admitting: Hematology

## 2021-08-06 VITALS — BP 105/75 | HR 70 | Temp 98.0°F | Resp 20 | Ht 66.0 in | Wt 129.6 lb

## 2021-08-06 DIAGNOSIS — C182 Malignant neoplasm of ascending colon: Secondary | ICD-10-CM | POA: Diagnosis not present

## 2021-08-06 DIAGNOSIS — Z95828 Presence of other vascular implants and grafts: Secondary | ICD-10-CM

## 2021-08-06 DIAGNOSIS — R112 Nausea with vomiting, unspecified: Secondary | ICD-10-CM | POA: Diagnosis not present

## 2021-08-06 DIAGNOSIS — D500 Iron deficiency anemia secondary to blood loss (chronic): Secondary | ICD-10-CM

## 2021-08-06 DIAGNOSIS — C786 Secondary malignant neoplasm of retroperitoneum and peritoneum: Secondary | ICD-10-CM | POA: Diagnosis not present

## 2021-08-06 DIAGNOSIS — D630 Anemia in neoplastic disease: Secondary | ICD-10-CM | POA: Diagnosis not present

## 2021-08-06 DIAGNOSIS — K5900 Constipation, unspecified: Secondary | ICD-10-CM | POA: Diagnosis not present

## 2021-08-06 DIAGNOSIS — Z5111 Encounter for antineoplastic chemotherapy: Secondary | ICD-10-CM | POA: Diagnosis not present

## 2021-08-06 DIAGNOSIS — Z933 Colostomy status: Secondary | ICD-10-CM | POA: Diagnosis not present

## 2021-08-06 DIAGNOSIS — Z9221 Personal history of antineoplastic chemotherapy: Secondary | ICD-10-CM | POA: Diagnosis not present

## 2021-08-06 LAB — CBC WITH DIFFERENTIAL (CANCER CENTER ONLY)
Abs Immature Granulocytes: 0.03 10*3/uL (ref 0.00–0.07)
Basophils Absolute: 0 10*3/uL (ref 0.0–0.1)
Basophils Relative: 0 %
Eosinophils Absolute: 0.1 10*3/uL (ref 0.0–0.5)
Eosinophils Relative: 1 %
HCT: 32.9 % — ABNORMAL LOW (ref 39.0–52.0)
Hemoglobin: 11.2 g/dL — ABNORMAL LOW (ref 13.0–17.0)
Immature Granulocytes: 0 %
Lymphocytes Relative: 30 %
Lymphs Abs: 2.2 10*3/uL (ref 0.7–4.0)
MCH: 29.4 pg (ref 26.0–34.0)
MCHC: 34 g/dL (ref 30.0–36.0)
MCV: 86.4 fL (ref 80.0–100.0)
Monocytes Absolute: 0.8 10*3/uL (ref 0.1–1.0)
Monocytes Relative: 11 %
Neutro Abs: 4.2 10*3/uL (ref 1.7–7.7)
Neutrophils Relative %: 58 %
Platelet Count: 273 10*3/uL (ref 150–400)
RBC: 3.81 MIL/uL — ABNORMAL LOW (ref 4.22–5.81)
RDW: 17.4 % — ABNORMAL HIGH (ref 11.5–15.5)
WBC Count: 7.2 10*3/uL (ref 4.0–10.5)
nRBC: 0 % (ref 0.0–0.2)

## 2021-08-06 LAB — CMP (CANCER CENTER ONLY)
ALT: 23 U/L (ref 0–44)
AST: 20 U/L (ref 15–41)
Albumin: 3.5 g/dL (ref 3.5–5.0)
Alkaline Phosphatase: 91 U/L (ref 38–126)
Anion gap: 8 (ref 5–15)
BUN: 11 mg/dL (ref 8–23)
CO2: 28 mmol/L (ref 22–32)
Calcium: 9.5 mg/dL (ref 8.9–10.3)
Chloride: 104 mmol/L (ref 98–111)
Creatinine: 0.83 mg/dL (ref 0.61–1.24)
GFR, Estimated: >60 mL/min (ref >60)
Glucose, Bld: 92 mg/dL (ref 70–99)
Potassium: 4.2 mmol/L (ref 3.5–5.1)
Sodium: 140 mmol/L (ref 135–145)
Total Bilirubin: 0.6 mg/dL (ref 0.3–1.2)
Total Protein: 6.9 g/dL (ref 6.5–8.1)

## 2021-08-06 MED ORDER — ATROPINE SULFATE 1 MG/ML IJ SOLN
0.5000 mg | Freq: Once | INTRAMUSCULAR | Status: AC | PRN
  Administered 2021-08-06: 0.5 mg via INTRAVENOUS
  Filled 2021-08-06: qty 1

## 2021-08-06 MED ORDER — SODIUM CHLORIDE 0.9 % IV SOLN
400.0000 mg/m2 | Freq: Once | INTRAVENOUS | Status: AC
  Administered 2021-08-06: 652 mg via INTRAVENOUS
  Filled 2021-08-06: qty 32.6

## 2021-08-06 MED ORDER — SODIUM CHLORIDE 0.9 % IV SOLN
2000.0000 mg/m2 | INTRAVENOUS | Status: DC
  Administered 2021-08-06: 3250 mg via INTRAVENOUS
  Filled 2021-08-06: qty 65

## 2021-08-06 MED ORDER — SODIUM CHLORIDE 0.9 % IV SOLN
10.0000 mg | Freq: Once | INTRAVENOUS | Status: AC
  Administered 2021-08-06: 10 mg via INTRAVENOUS
  Filled 2021-08-06: qty 10

## 2021-08-06 MED ORDER — SODIUM CHLORIDE 0.9% FLUSH
10.0000 mL | INTRAVENOUS | Status: DC | PRN
Start: 2021-08-06 — End: 2021-08-06

## 2021-08-06 MED ORDER — SODIUM CHLORIDE 0.9% FLUSH
10.0000 mL | INTRAVENOUS | Status: DC | PRN
  Administered 2021-08-06: 10 mL

## 2021-08-06 MED ORDER — HEPARIN SOD (PORK) LOCK FLUSH 100 UNIT/ML IV SOLN: 500.0000 [IU] | Freq: Once | INTRAVENOUS | Status: DC | PRN

## 2021-08-06 MED ORDER — SODIUM CHLORIDE 0.9 % IV SOLN
100.0000 mg/m2 | Freq: Once | INTRAVENOUS | Status: AC
  Administered 2021-08-06: 160 mg via INTRAVENOUS
  Filled 2021-08-06: qty 8

## 2021-08-06 MED ORDER — SODIUM CHLORIDE 0.9 % IV SOLN: Freq: Once | INTRAVENOUS | Status: AC

## 2021-08-06 MED ORDER — PALONOSETRON HCL INJECTION 0.25 MG/5ML
0.2500 mg | Freq: Once | INTRAVENOUS | Status: AC
  Administered 2021-08-06: 0.25 mg via INTRAVENOUS
  Filled 2021-08-06: qty 5

## 2021-08-06 NOTE — Patient Instructions (Signed)
[facility]  Discharge Instructions: Thank you for choosing [facility] to provide your oncology and hematology care.   If you have a lab appointment with the [facility], please go directly to the [facility] and check in at the registration area.   Wear comfortable clothing and clothing appropriate for easy access to any Portacath or PICC line.   We strive to give you quality time with your provider. You may need to reschedule your appointment if you arrive late (15 or more minutes).  Arriving late affects you and other patients whose appointments are after yours.  Also, if you miss three or more appointments without notifying the office, you may be dismissed from the clinic at the provider's discretion.      For prescription refill requests, have your pharmacy contact our office and allow 72 hours for refills to be completed.    Today you received the following chemotherapy and/or immunotherapy agents Irinotecan, leucovorin, and 5 FU      To help prevent nausea and vomiting after your treatment, we encourage you to take your nausea medication as directed.  BELOW ARE SYMPTOMS THAT SHOULD BE REPORTED IMMEDIATELY: *FEVER GREATER THAN 100.4 F (38 C) OR HIGHER *CHILLS OR SWEATING *NAUSEA AND VOMITING THAT IS NOT CONTROLLED WITH YOUR NAUSEA MEDICATION *UNUSUAL SHORTNESS OF BREATH *UNUSUAL BRUISING OR BLEEDING *URINARY PROBLEMS (pain or burning when urinating, or frequent urination) *BOWEL PROBLEMS (unusual diarrhea, constipation, pain near the anus) TENDERNESS IN MOUTH AND THROAT WITH OR WITHOUT PRESENCE OF ULCERS (sore throat, sores in mouth, or a toothache) UNUSUAL RASH, SWELLING OR PAIN  UNUSUAL VAGINAL DISCHARGE OR ITCHING   Items with * indicate a potential emergency and should be followed up as soon as possible or go to the Emergency Department if any problems should occur.  Please show the CHEMOTHERAPY ALERT CARD or IMMUNOTHERAPY  ALERT CARD at check-in to the Emergency Department and triage nurse.  Should you have questions after your visit or need to cancel or reschedule your appointment, please contact [facility]  Dept: [phone]  and follow the prompts.  Office hours are 8:00 a.m. to 4:30 p.m. Monday - Friday. Please note that voicemails left after 4:00 p.m. may not be returned until the following business day.  We are closed weekends and major holidays. You have access to a nurse at all times for urgent questions. Please call the main number to the clinic Dept: [phone] and follow the prompts.   For any non-urgent questions, you may also contact your provider using MyChart. We now offer e-Visits for anyone 48 and older to request care online for non-urgent symptoms. For details visit [email or web address].   Also download the MyChart app! Go to the app store, search "MyChart", open the app, select [facility], and log in with your MyChart username and password.  Due to Covid, a mask is required upon entering the hospital/clinic. If you do not have a mask, one will be given to you upon arrival. For doctor visits, patients may have 1 support person aged 22 or older with them. For treatment visits, patients cannot have anyone with them due to current Covid guidelines and our immunocompromised population.

## 2021-08-06 NOTE — Progress Notes (Signed)
[facility]   Telephone:([phone] Fax:([phone]   Clinic Follow up Note   Patient Care Team: Lajean Manes, MD as PCP - General (Internal Medicine) Berle Mull, MD as Consulting Physician (Family Medicine) Clarene Essex, MD as Consulting Physician (Gastroenterology) Truitt Merle, MD as Consulting Physician (Oncology)  Date of Service:  08/06/2021  CHIEF COMPLAINT: f/u of metastatic colon cancer  ASSESSMENT & PLAN:  Nathaniel Norton is a 70 y.o. male with   1. Cancer of right colon, with peritoneal and lung metastasis, stage IV,  MMR normal, KRAS mutation (+), MSS -He was diagnosed in 03/2019. His Colonoscopy biopsy showed adenocarcinoma of right hepatic flexure colon. His peritoneal biopsy from 04/27/19 shows adenocarcinoma, consistent with metastasis of his colon cancer. This is now stage IV disease. -He was treated with first-line FOLFOX in June-November 2020 before proceeding with HIPEC surgery by Dr. Clovis Riley on 12/17/19.  -09/10/20 PET showed disease progression with hypermetabolic pleural (right) and peritoneal nodules. A biopsy was not felt to be necessary given his previously known peritoneal metastasis -He started second line FOLFIRI and bevacizumab q2weeks on 09/18/20, tolerated first 2 cycles poorly with abdominal pain/cramping and diarrhea.  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 Clovis Riley on 02/24/21.   -Resumed low-dose single agent irinotecan on 04/03/2021, added low-dose 5-FU back on 05/14/21.  He has overall tolerated well. -Restaging CT AP 07/07/21 showed no evidence of recurrent or metastatic disease in abdomen and pelvis, stable plural mets and small lung nodules, no other new lesions  -He is  clinically stable. Lab reviewed, adequate for treatment, will proceed low-dose FOLFIRI today and continue every 2 weeks   2. Symptom management: Nausea, vomiting, weight loss, constipation -weight is stable lately -continue f/u with dietician  -He has had worsening constipation after chemo lately which causes nausea and vomiting. He is using oxycodone as needed. -He tried dulcolax and colace, but he took it only as needed. I recommended he take colace more regularly, once daily for 3-5 days after chemo.   3. 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 Watt Climes showed internal hemorrhoids, benign polyps, diverticulosis and colon cancer. -He was treated with oral iron, IV Feraheme on 05/03/19 and 05/16/19 (had reaction with 2nd dose).  -With cancer recurrence his anemia recurred. Hgb went down when he restarted chemotherapy in 03/2021, recovering since 05/2021. Hgb stable in 11 range, 11.2 today (08/06/21)   4. Goal of care discussion -The patient understands the goal of care is palliative. -I previously recommended DNR, he will think about it      PLAN:  -proceed with same low dose FOLFIRI today -Lab, flush, f/u and FOLFIRI in 2 and 4 weeks -we reviewed management of constipation    No problem-specific Assessment & Plan notes found for this encounter.   SUMMARY OF ONCOLOGIC HISTORY: Oncology History Overview Note  Cancer Staging Cancer of right colon [facility]) Staging form: Colon and Rectum, AJCC 8th Edition - Clinical stage from 04/09/2019: Stage IVC (cTX, cNX, pM1c) - Signed by Truitt Merle, MD on 05/03/2019     Cancer of right colon [facility])  04/09/2019 Procedure   Colonoscopy 04/09/19 by Dr Watt Climes IMPRESSION -internal hemorrhoids -Diverticulosis in the  sigmoid colon  -2 small polyps in the rectum and in the proximal transverse colon, removed with a hot snare. Resected and retrieved.  -3 medium polyps in the proximal transverse  colon, in the mid transverse colon and in the distal transverse colon, removed and resected and retrieved.  -likely malignant partially obstructing tumor at the hepatic flexure. biopsied, tattooed.  -1 large polyp in the mid ascending colon  -the examination was otherwise normal     04/09/2019 Initial Biopsy   FINAL MICROSCOPIC DIAGNOSIS: 04/09/19 1. LG intestine-hepatic flexure, Biopsy:   INVASIVE WELL DIFFERENTIATED ADENOCARCINOMA   04/09/2019 Cancer Staging   Staging form: Colon and Rectum, AJCC 8th Edition - Clinical stage from 04/09/2019: Stage IVC (cTX, cNX, pM1c) - Signed by Truitt Merle, MD on 05/03/2019   04/10/2019 Imaging   CT AP 04/10/19  IMPRESSION: 1. There is an eccentric mass of the colon involving the ascending colon near the hepatic flexure measuring approximately 3.4 x 3.4 by 2.0 cm (series 2, image 37, series 3, image 37). There is extensive soft tissue nodularity of the mesocolon and omentum, and likely areas of the peritoneum, for example bilateral upper quadrants (series 2, image 25). Findings are consistent with primary colon malignancy, probable omental and peritoneal involvement, and small volume malignant ascites. 2.  Other chronic and incidental findings as detailed above.   04/20/2019 Initial Diagnosis   Cancer of right colon ([place])   04/26/2019 Imaging   CT Chest 04/26/19 IMPRESSION: 1. Borderline to mild lower thoracic adenopathy, including within the right internal mammary and juxta cardiophrenic stations. Given the appearance of the upper abdomen, suspicious for nodal metastasis. 2. A low right paratracheal node is borderline sized, but favored to be reactive. 3. No evidence of pulmonary metastasis. 4. Peritoneal metastasis and abdominal ascites, as before. 5. Pancreatic parenchymal calcifications indicative of chronic calcific pancreatitis. 6. Coronary artery atherosclerosis.   04/27/2019 Pathology Results   Diagnosis 04/27/19 Peritoneum, biopsy, right  upper quadrant, perihepatic - ADENOCARCINOMA, CONSISTENT WITH COLONIC PRIMARY. - SEE COMMENT.   05/16/2019 - 10/31/2019 Chemotherapy   First line FOLFOX every 2 weeks with Avastin starting with cycle 2 starting 05/16/19. 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 Stopped after 10/31/19 for surgery.    07/23/2019 Imaging    CT AP W Contrast  IMPRESSION: 1. Primary ascending colon mass may be minimally smaller. Peritoneal metastatic disease appears slightly improved as well. 2. Chronic calcific pancreatitis. 3. Tiny left renal stone. 4. Small left inguinal hernia contains a knuckle of unobstructed colon. 5. Enlarged prostate.   10/29/2019 Imaging   CT AP W Contrast IMPRESSION: No significant change in small ascending colon soft tissue mass and diffuse omental carcinomatosis.   No new or progressive metastatic disease identified.   Stable small left inguinal hernia containing a loop of sigmoid colon. No evidence of bowel obstruction or strangulation.   Colonic diverticulosis. No radiographic evidence of diverticulitis.   Stable enlarged prostate.   12/17/2019 Pathology Results   HIPEC Surgery by Dr Clovis Riley 12/17/19 FINAL PATHOLOGIC DIAGNOSIS  MICROSCOPIC EXAMINATION AND DIAGNOSIS  A.          "RIGHT COLON, OMENTUM, SPLEEN":       Invasive adenocarcinoma with mucinous features, moderately differentiated.  Tumor involves mesentery and spleen.  Five out of ten lymph nodes involved by adenocarcinoma with perinodal soft tissue involvement (5/10), see comment.  Margins are  uninvolved by invasive carcinoma.  Ileum and appendix, uninvolved.  See comment and cancer case summary.  B.          "ROUND LIGAMENT OF LIVER":       Involved by adenocarcinoma with mucinous features, moderately differentiated.  C.          "RIGHT  DIAPHRAM STRIPPING":       Negative for carcinoma.  D.          "RIGHT HEPATIC CAPSULECTOMY":       Chronic inflammation and fibrosis.  Negative for carcinoma.  E.          "DEBRIDED TUMOR":       Involved by adenocarcinoma with mucinous features, moderately differentiated.   COMMENT: Sections disclose five out of ten lymph nodes involved by adenocarcinoma with perinodal soft tissue involvement. However, it is not possible to be certain if this represents lymph nodes replaced by tumor and/or soft tissue involvement. In addition, focal perineural invasion is seen.   04/03/2020 Imaging   CT AP W contrast  IMPRESSION: 1. Status post interval splenectomy and right hemicolectomy. 2. Response to therapy of omental/peritoneal metastasis. The only residual indeterminate finding is fluid density along the capsule of the hepatic dome which could be postoperative or secondary to localized residual peritoneal disease. 3. Left nephrolithiasis. 4.  Possible constipation. 5. Prostatomegaly. 6.  Aortic Atherosclerosis (ICD10-I70.0).   07/11/2020 Imaging   CT AP  1.  Postsurgical changes of subtle reductive surgery including right hemicolectomy, omentectomy, splenectomy, and right hepatic capsulectomy.  2.  Interval development of multiple areas of low attenuation involving the liver, as described above. This the intraparenchymal low-density lesion is concerning for metastatic disease. Other disease along the capsule may be post therapy related. Residual thickening of the anterior peritoneum particularly anterior to the right lobe of the liver is also concerning for some residual disease although appears significantly improved. Recommend attention on follow-up.  3.  No definite evidence of residual peritoneal disease status post HIPEC.   09/18/2020 -  Chemotherapy   Second line chemo FOLFIRI and bevacizumab q2weeks starting 09/18/20. Chemo on hold since 12/23/20 due to fistula. RESTART with low dose  irinotecan alone on 04/03/21.   12/19/2020 Imaging   CT CAP  IMPRESSION: 1. Interval development of a 1.6 x 2.7 x 1.6 cm collection of gas and debris between the sigmoid colon and dome of bladder, compatible with small abscess. A portion of this abscess is probably intramural at the dome of bladder. Marked associated thickening of the adjacent colonic and bladder walls with tiny gas bubbles in the thickened bladder wall and prominent amount of free gas in the bladder lumen suggests an associated colovesical fistula. Tiny gas bubbles are also seen in the central prostate gland and may be in the urethra although parenchymal gas within the prostate gland is not excluded on this exam. 2. Nodularity along the major and minor fissures of the right hemithorax has decreased in the interval, nearly imperceptible today. 3. Peritoneal implants along the liver, right abdomen, and deep to the left paramidline anterior abdominal wall have resolved in the interval by CT imaging. 4. Tiny right lower lobe pulmonary nodule is more conspicuous today. Attention on follow-up recommended. 5. Stable 4 mm hypodensity in the dome of the right liver, too small to characterize. 6. Nonobstructing left renal stones with bilateral renal cysts. 7. Stable compression deformity at multiple thoracic and lumbar levels. 8. Aortic Atherosclerosis (ICD10-I70.0).   02/24/2021 Surgery   LAPAROTOMY EXPLORATORY resection  of sigmoid colon and proximal rectum, takedown of colovesical fistula, creation of end ileosotmy by Dr Clovis Riley    Final Pathologic Diagnosis      A.  SIGMOID COLON AND RECTUM, RESECTION:               Invasive adenocarcinoma with mucinous features, moderately differentiated.               Tumor measures 3.2 cm in greatest dimension. Tumor invades visceral peritoneum.  Lymphovascular invasion is present.               Metastatic adenocarcinoma involving seven (of 22) lymph nodes (7/22).  One margin  involved by adenocarcinoma (grossly presumed distal margin).     03/25/2021 Imaging   IMPRESSION: 1. New bilateral pulmonary nodules worrisome for new pulmonary metastatic disease. 2. New small right pleural effusion with overlying atelectasis. 3. Recurrent pleural nodularity on the right. 4. No findings for abdominal/pelvic metastatic disease. 5. Stable severe atrophy of the pancreatic body and tail with associated main pancreatic duct dilatation. No obvious obstructing pancreatic lesion but this was not present on the prior CT scan from 04/03/2020. It may be due to a stricture or a small ductal lesion, not well seen. MRI abdomen without and with contrast may be helpful for further evaluation of this finding. 6. Stable surgical changes involving the colon with a Hartmann's pouch and left lower quadrant colostomy. 7. Stable enlarged prostate gland. 8. Stable thoracic and lumbar compression fractures.   07/07/2021 Imaging   CT AP  IMPRESSION: 1. Redemonstrated postoperative findings of right hemicolectomy and ileocolic anastomosis as well as Hartmann procedure sigmoid colon resection and left lower quadrant end colostomy. 2. No evidence of recurrent or metastatic disease in the abdomen or pelvis. 3. Multiple small pulmonary nodules are not significantly changed compared to prior examination. There is unchanged thickening and nodularity along the pleural surfaces of the right lung. Findings remain consistent with pulmonary parenchymal and pleural metastatic disease. 4. Small right pleural effusion and associated atelectasis or consolidation, slightly increased compared to prior examination. 5. Status post splenectomy and cholecystectomy. 6. Prostatomegaly. 7. Coronary artery disease.   Aortic Atherosclerosis (ICD10-I70.0).      CURRENT THERAPY:  Second line chemo FOLFIRI and bevacizumab q2weeks starting 09/18/20. Chemo on hold since 12/23/20 due to fistula. RESTART with low dose  irinotecan alone on 04/03/21.  INTERVAL HISTORY:  Nathaniel Norton is here for a follow up of metastatic colon cancer. He was last seen by me on 07/23/21. He presents to the clinic alone. He reports intermittent cramping following his last treatment. He notes he tried dulcolax, but it took a long time to work. Once it does kick in, he notes his stool is more liquid. He notes he has tried deep breathing to relieve his cramping, and this helps but only briefly. He endorses using oxycodone only as needed. He reports it does not seem to last very long, maybe an hour or so. His stomach was making noises during our appointment today. He endorses eating breakfast.   All other systems were reviewed with the patient and are negative.  MEDICAL HISTORY:  Past Medical History:  Diagnosis Date   colon ca dx'd 03/2019    SURGICAL HISTORY: Past Surgical History:  Procedure Laterality Date   CATARACT EXTRACTION     IR IMAGING GUIDED PORT INSERTION  05/10/2019   L4 fracture  2012   RETINAL DETACHMENT SURGERY     Right hand surgery  1974    I  have reviewed the social history and family history with the patient and they are unchanged from previous note.  ALLERGIES:  is allergic to feraheme [ferumoxytol] and codeine.  MEDICATIONS:  Current Outpatient Medications  Medication Sig Dispense Refill   diphenoxylate-atropine (LOMOTIL) 2.5-0.025 MG tablet Take 1-2 tablets by mouth 4 (four) times daily as needed for diarrhea or loose stools. 90 tablet 1   fluticasone (FLONASE) 50 MCG/ACT nasal spray Place into the nose.     lidocaine-prilocaine (EMLA) cream Apply 1 application topically as needed. 30 g 1   Multiple Vitamin (MULTIVITAMIN) tablet Take 1 tablet by mouth daily.     MULTIPLE VITAMIN PO      ondansetron (ZOFRAN) 8 MG tablet Take 1 tablet (8 mg total) by mouth every 8 (eight) hours as needed for nausea or vomiting. Start on day 3 after chemotherapy 20 tablet 0   ondansetron (ZOFRAN-ODT) 4 MG  disintegrating tablet Take 4 mg by mouth every 8 (eight) hours as needed.     oxyCODONE (OXY IR/ROXICODONE) 5 MG immediate release tablet Take 1 tablet (5 mg total) by mouth every 8 (eight) hours as needed for severe pain. 90 tablet 0   prochlorperazine (COMPAZINE) 10 MG tablet Take 1 tablet (10 mg total) by mouth every 6 (six) hours as needed (Nausea or vomiting). 30 tablet 2   sodium chloride flush 0.9 % SOLN injection      No current facility-administered medications for this visit.   Facility-Administered Medications Ordered in Other Visits  Medication Dose Route Frequency Provider Last Rate Last Admin   sodium chloride flush (NS) 0.9 % injection 10 mL  10 mL Intracatheter PRN Truitt Merle, MD   10 mL at 05/30/21 1145    PHYSICAL EXAMINATION: ECOG PERFORMANCE STATUS: 1 - Symptomatic but completely ambulatory  Vitals:   08/06/21 1009  BP: 105/75  Pulse: 70  Resp: 20  Temp: 98 F (36.7 C)  SpO2: 99%   Wt Readings from Last 3 Encounters:  08/06/21 129 lb 9.6 oz (58.8 kg)  07/23/21 128 lb (58.1 kg)  07/09/21 130 lb 14.4 oz (59.4 kg)     GENERAL:alert, no distress and comfortable SKIN: skin color normal, no rashes or significant lesions EYES: normal, Conjunctiva are pink and non-injected, sclera clear  NEURO: alert & oriented x 3 with fluent speech  LABORATORY DATA:  I have reviewed the data as listed CBC Latest Ref Rng & Units 08/06/2021 07/23/2021 07/09/2021  WBC 4.0 - 10.5 K/uL 7.2 7.5 7.9  Hemoglobin 13.0 - 17.0 g/dL 11.2(L) 11.2(L) 11.0(L)  Hematocrit 39.0 - 52.0 % 32.9(L) 33.3(L) 32.5(L)  Platelets 150 - 400 K/uL 273 273 291     CMP Latest Ref Rng & Units 07/23/2021 07/09/2021 06/25/2021  Glucose 70 - 99 mg/dL 99 93 101(H)  BUN 8 - 23 mg/dL _0 Creatinine 0.61 - 1.24 mg/dL 0.80 0.80 0.73  Sodium 135 - 145 mmol/L 140 141 141  Potassium 3.5 - 5.1 mmol/L 4.1 4.2 4.4  Chloride 98 - 111 mmol/L 106 105 103  CO2 22 - 32 mmol/L _1 Calcium 8.9 - 10.3 mg/dL 9.2 9.6  9.4  Total Protein 6.5 - 8.1 g/dL 6.6 6.6 6.7  Total Bilirubin 0.3 - 1.2 mg/dL 0.5 0.8 0.5  Alkaline Phos 38 - 126 U/L 88 85 134(H)  AST 15 - 41 U/L _2 ALT 0 - 44 U/L _3 RADIOGRAPHIC STUDIES: I have personally reviewed the  radiological images as listed and agreed with the findings in the report. No results found.    No orders of the defined types were placed in this encounter.  All questions were answered. The patient knows to call the clinic with any problems, questions or concerns. No barriers to learning was detected. The total time spent in the appointment was 30 minutes.     Truitt Merle, MD 08/06/2021   I, Wilburn Mylar, am acting as scribe for Truitt Merle, MD.   I have reviewed the above documentation for accuracy and completeness, and I agree with the above.

## 2021-08-08 ENCOUNTER — Inpatient Hospital Stay: Payer: Medicare Other

## 2021-08-08 ENCOUNTER — Other Ambulatory Visit: Payer: Self-pay

## 2021-08-08 VITALS — BP 108/65 | HR 83 | Temp 98.0°F | Resp 20

## 2021-08-08 DIAGNOSIS — C182 Malignant neoplasm of ascending colon: Secondary | ICD-10-CM

## 2021-08-08 DIAGNOSIS — Z9221 Personal history of antineoplastic chemotherapy: Secondary | ICD-10-CM | POA: Diagnosis not present

## 2021-08-08 DIAGNOSIS — Z933 Colostomy status: Secondary | ICD-10-CM | POA: Diagnosis not present

## 2021-08-08 DIAGNOSIS — C786 Secondary malignant neoplasm of retroperitoneum and peritoneum: Secondary | ICD-10-CM | POA: Diagnosis not present

## 2021-08-08 DIAGNOSIS — K5900 Constipation, unspecified: Secondary | ICD-10-CM | POA: Diagnosis not present

## 2021-08-08 DIAGNOSIS — Z5111 Encounter for antineoplastic chemotherapy: Secondary | ICD-10-CM | POA: Diagnosis not present

## 2021-08-08 DIAGNOSIS — D630 Anemia in neoplastic disease: Secondary | ICD-10-CM | POA: Diagnosis not present

## 2021-08-08 DIAGNOSIS — R112 Nausea with vomiting, unspecified: Secondary | ICD-10-CM | POA: Diagnosis not present

## 2021-08-08 MED ORDER — SODIUM CHLORIDE 0.9% FLUSH
10.0000 mL | INTRAVENOUS | Status: DC | PRN
  Administered 2021-08-08: 10 mL

## 2021-08-08 MED ORDER — HEPARIN SOD (PORK) LOCK FLUSH 100 UNIT/ML IV SOLN
500.0000 [IU] | Freq: Once | INTRAVENOUS | Status: AC | PRN
  Administered 2021-08-08: 500 [IU]

## 2021-08-14 DIAGNOSIS — Z933 Colostomy status: Secondary | ICD-10-CM | POA: Diagnosis not present

## 2021-08-14 DIAGNOSIS — Z85038 Personal history of other malignant neoplasm of large intestine: Secondary | ICD-10-CM | POA: Diagnosis not present

## 2021-08-14 DIAGNOSIS — C182 Malignant neoplasm of ascending colon: Secondary | ICD-10-CM | POA: Diagnosis not present

## 2021-08-15 DIAGNOSIS — Z85038 Personal history of other malignant neoplasm of large intestine: Secondary | ICD-10-CM | POA: Diagnosis not present

## 2021-08-15 DIAGNOSIS — Z933 Colostomy status: Secondary | ICD-10-CM | POA: Diagnosis not present

## 2021-08-19 MED FILL — Dexamethasone Sodium Phosphate Inj 100 MG/10ML: INTRAMUSCULAR | Qty: 1 | Status: AC

## 2021-08-20 ENCOUNTER — Encounter: Payer: Self-pay | Admitting: Hematology

## 2021-08-20 ENCOUNTER — Inpatient Hospital Stay: Payer: Medicare Other

## 2021-08-20 ENCOUNTER — Other Ambulatory Visit: Payer: Self-pay

## 2021-08-20 ENCOUNTER — Inpatient Hospital Stay: Payer: Medicare Other | Attending: Hematology

## 2021-08-20 ENCOUNTER — Inpatient Hospital Stay (HOSPITAL_BASED_OUTPATIENT_CLINIC_OR_DEPARTMENT_OTHER): Payer: Medicare Other | Admitting: Hematology

## 2021-08-20 VITALS — BP 115/77 | HR 82 | Temp 98.2°F | Resp 18 | Ht 66.0 in | Wt 129.2 lb

## 2021-08-20 DIAGNOSIS — Z923 Personal history of irradiation: Secondary | ICD-10-CM | POA: Diagnosis not present

## 2021-08-20 DIAGNOSIS — C182 Malignant neoplasm of ascending colon: Secondary | ICD-10-CM | POA: Insufficient documentation

## 2021-08-20 DIAGNOSIS — D630 Anemia in neoplastic disease: Secondary | ICD-10-CM | POA: Insufficient documentation

## 2021-08-20 DIAGNOSIS — C786 Secondary malignant neoplasm of retroperitoneum and peritoneum: Secondary | ICD-10-CM | POA: Diagnosis not present

## 2021-08-20 DIAGNOSIS — Z95828 Presence of other vascular implants and grafts: Secondary | ICD-10-CM

## 2021-08-20 DIAGNOSIS — Z5111 Encounter for antineoplastic chemotherapy: Secondary | ICD-10-CM | POA: Diagnosis not present

## 2021-08-20 DIAGNOSIS — R112 Nausea with vomiting, unspecified: Secondary | ICD-10-CM | POA: Diagnosis not present

## 2021-08-20 DIAGNOSIS — R634 Abnormal weight loss: Secondary | ICD-10-CM | POA: Insufficient documentation

## 2021-08-20 DIAGNOSIS — K5900 Constipation, unspecified: Secondary | ICD-10-CM | POA: Diagnosis not present

## 2021-08-20 DIAGNOSIS — D500 Iron deficiency anemia secondary to blood loss (chronic): Secondary | ICD-10-CM

## 2021-08-20 LAB — CBC WITH DIFFERENTIAL (CANCER CENTER ONLY)
Abs Immature Granulocytes: 0.02 10*3/uL (ref 0.00–0.07)
Basophils Absolute: 0 10*3/uL (ref 0.0–0.1)
Basophils Relative: 0 %
Eosinophils Absolute: 0.1 10*3/uL (ref 0.0–0.5)
Eosinophils Relative: 1 %
HCT: 33.5 % — ABNORMAL LOW (ref 39.0–52.0)
Hemoglobin: 11.2 g/dL — ABNORMAL LOW (ref 13.0–17.0)
Immature Granulocytes: 0 %
Lymphocytes Relative: 34 %
Lymphs Abs: 2.4 10*3/uL (ref 0.7–4.0)
MCH: 28.9 pg (ref 26.0–34.0)
MCHC: 33.4 g/dL (ref 30.0–36.0)
MCV: 86.3 fL (ref 80.0–100.0)
Monocytes Absolute: 1.1 10*3/uL — ABNORMAL HIGH (ref 0.1–1.0)
Monocytes Relative: 15 %
Neutro Abs: 3.6 10*3/uL (ref 1.7–7.7)
Neutrophils Relative %: 50 %
Platelet Count: 308 10*3/uL (ref 150–400)
RBC: 3.88 MIL/uL — ABNORMAL LOW (ref 4.22–5.81)
RDW: 16.6 % — ABNORMAL HIGH (ref 11.5–15.5)
WBC Count: 7.2 10*3/uL (ref 4.0–10.5)
nRBC: 0 % (ref 0.0–0.2)

## 2021-08-20 LAB — CMP (CANCER CENTER ONLY)
ALT: 26 U/L (ref 0–44)
AST: 24 U/L (ref 15–41)
Albumin: 3.4 g/dL — ABNORMAL LOW (ref 3.5–5.0)
Alkaline Phosphatase: 104 U/L (ref 38–126)
Anion gap: 9 (ref 5–15)
BUN: 13 mg/dL (ref 8–23)
CO2: 27 mmol/L (ref 22–32)
Calcium: 9.6 mg/dL (ref 8.9–10.3)
Chloride: 105 mmol/L (ref 98–111)
Creatinine: 1.16 mg/dL (ref 0.61–1.24)
GFR, Estimated: >60 mL/min (ref >60)
Glucose, Bld: 91 mg/dL (ref 70–99)
Potassium: 4.1 mmol/L (ref 3.5–5.1)
Sodium: 141 mmol/L (ref 135–145)
Total Bilirubin: 0.4 mg/dL (ref 0.3–1.2)
Total Protein: 7.1 g/dL (ref 6.5–8.1)

## 2021-08-20 LAB — FERRITIN: Ferritin: 185 ng/mL (ref 24–336)

## 2021-08-20 LAB — CEA (IN HOUSE-CHCC): CEA (CHCC-In House): 9.44 ng/mL — ABNORMAL HIGH (ref 0.00–5.00)

## 2021-08-20 MED ORDER — SODIUM CHLORIDE 0.9 % IV SOLN: Freq: Once | INTRAVENOUS | Status: AC

## 2021-08-20 MED ORDER — SODIUM CHLORIDE 0.9 % IV SOLN
10.0000 mg | Freq: Once | INTRAVENOUS | Status: AC
  Administered 2021-08-20: 10 mg via INTRAVENOUS
  Filled 2021-08-20: qty 10

## 2021-08-20 MED ORDER — HEPARIN SOD (PORK) LOCK FLUSH 100 UNIT/ML IV SOLN: 500.0000 [IU] | Freq: Once | INTRAVENOUS | Status: DC | PRN

## 2021-08-20 MED ORDER — SODIUM CHLORIDE 0.9% FLUSH
10.0000 mL | INTRAVENOUS | Status: DC | PRN
  Administered 2021-08-20: 10 mL

## 2021-08-20 MED ORDER — ATROPINE SULFATE 1 MG/ML IJ SOLN
0.5000 mg | Freq: Once | INTRAMUSCULAR | Status: AC | PRN
  Administered 2021-08-20: 0.5 mg via INTRAVENOUS
  Filled 2021-08-20: qty 1

## 2021-08-20 MED ORDER — SODIUM CHLORIDE 0.9% FLUSH: 10.0000 mL | INTRAVENOUS | Status: DC | PRN

## 2021-08-20 MED ORDER — PALONOSETRON HCL INJECTION 0.25 MG/5ML
0.2500 mg | Freq: Once | INTRAVENOUS | Status: AC
  Administered 2021-08-20: 0.25 mg via INTRAVENOUS
  Filled 2021-08-20: qty 5

## 2021-08-20 MED ORDER — SODIUM CHLORIDE 0.9 % IV SOLN
2000.0000 mg/m2 | INTRAVENOUS | Status: DC
  Administered 2021-08-20: 3250 mg via INTRAVENOUS
  Filled 2021-08-20: qty 65

## 2021-08-20 MED ORDER — SODIUM CHLORIDE 0.9 % IV SOLN
400.0000 mg/m2 | Freq: Once | INTRAVENOUS | Status: AC
  Administered 2021-08-20: 652 mg via INTRAVENOUS
  Filled 2021-08-20: qty 32.6

## 2021-08-20 MED ORDER — SODIUM CHLORIDE 0.9 % IV SOLN
100.0000 mg/m2 | Freq: Once | INTRAVENOUS | Status: AC
  Administered 2021-08-20: 160 mg via INTRAVENOUS
  Filled 2021-08-20: qty 8

## 2021-08-20 MED ORDER — PROCHLORPERAZINE MALEATE 10 MG PO TABS
10.0000 mg | ORAL_TABLET | Freq: Once | ORAL | Status: AC
  Administered 2021-08-20: 10 mg via ORAL
  Filled 2021-08-20: qty 1

## 2021-08-20 NOTE — Patient Instructions (Signed)
[facility]   Discharge Instructions: Thank you for choosing [facility] to provide your oncology and hematology care.   If you have a lab appointment with the [facility], please go directly to the [facility] and check in at the registration area.   Wear comfortable clothing and clothing appropriate for easy access to any Portacath or PICC line.   We strive to give you quality time with your provider. You may need to reschedule your appointment if you arrive late (15 or more minutes).  Arriving late affects you and other patients whose appointments are after yours.  Also, if you miss three or more appointments without notifying the office, you may be dismissed from the clinic at the provider's discretion.      For prescription refill requests, have your pharmacy contact our office and allow 72 hours for refills to be completed.    Today you received the following chemotherapy and/or immunotherapy agents: Camptosar, Leucovorin, and Fluoroutacil.      To help prevent nausea and vomiting after your treatment, we encourage you to take your nausea medication as directed.  BELOW ARE SYMPTOMS THAT SHOULD BE REPORTED IMMEDIATELY: *FEVER GREATER THAN 100.4 F (38 C) OR HIGHER *CHILLS OR SWEATING *NAUSEA AND VOMITING THAT IS NOT CONTROLLED WITH YOUR NAUSEA MEDICATION *UNUSUAL SHORTNESS OF BREATH *UNUSUAL BRUISING OR BLEEDING *URINARY PROBLEMS (pain or burning when urinating, or frequent urination) *BOWEL PROBLEMS (unusual diarrhea, constipation, pain near the anus) TENDERNESS IN MOUTH AND THROAT WITH OR WITHOUT PRESENCE OF ULCERS (sore throat, sores in mouth, or a toothache) UNUSUAL RASH, SWELLING OR PAIN  UNUSUAL VAGINAL DISCHARGE OR ITCHING   Items with * indicate a potential emergency and should be followed up as soon as possible or go to the Emergency Department if any problems should occur.  Please show the CHEMOTHERAPY ALERT CARD or  IMMUNOTHERAPY ALERT CARD at check-in to the Emergency Department and triage nurse.  Should you have questions after your visit or need to cancel or reschedule your appointment, please contact [facility]  Dept: [phone]  and follow the prompts.  Office hours are 8:00 a.m. to 4:30 p.m. Monday - Friday. Please note that voicemails left after 4:00 p.m. may not be returned until the following business day.  We are closed weekends and major holidays. You have access to a nurse at all times for urgent questions. Please call the main number to the clinic Dept: [phone] and follow the prompts.   For any non-urgent questions, you may also contact your provider using MyChart. We now offer e-Visits for anyone 23 and older to request care online for non-urgent symptoms. For details visit [email or web address].   Also download the MyChart app! Go to the app store, search "MyChart", open the app, select [facility], and log in with your MyChart username and password.  Due to Covid, a mask is required upon entering the hospital/clinic. If you do not have a mask, one will be given to you upon arrival. For doctor visits, patients may have 1 support person aged 56 or older with them. For treatment visits, patients cannot have anyone with them due to current Covid guidelines and our immunocompromised population.

## 2021-08-20 NOTE — Progress Notes (Signed)
[facility]   Telephone:([phone] [phone]   Clinic Follow up Note   Patient Care Team: Lajean Manes, MD as PCP - General (Internal Medicine) Berle Mull, MD as Consulting Physician (Family Medicine) Clarene Essex, MD as Consulting Physician (Gastroenterology) Truitt Merle, MD as Consulting Physician (Oncology)  Date of Service:  08/20/2021  CHIEF COMPLAINT: f/u of metastatic colon cancer  CURRENT THERAPY:  Second line chemo FOLFIRI and bevacizumab q2weeks starting 09/18/20. Chemo on hold since 12/23/20 due to fistula. RESTART with low dose on 04/03/21.  ASSESSMENT & PLAN:  Nathaniel Norton is a 70 y.o. male with   1. Cancer of right colon, with peritoneal and lung metastasis, stage IV,  MMR normal, KRAS mutation (+), MSS -He was diagnosed in 03/2019. His Colonoscopy biopsy showed adenocarcinoma of right hepatic flexure colon. His peritoneal biopsy from 04/27/19 shows adenocarcinoma, consistent with metastasis of his colon cancer. This is now stage IV disease. -He was treated with first-line FOLFOX in June-November 2020 before proceeding with HIPEC surgery by Dr. Clovis Riley on 12/17/19.  -09/10/20 PET showed disease progression with hypermetabolic pleural (right) and peritoneal nodules. A biopsy was not felt to be necessary given his previously known peritoneal metastasis -He started second line FOLFIRI and bevacizumab q2weeks on 09/18/20, tolerated first 2 cycles poorly with abdominal pain/cramping and diarrhea.  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 Clovis Riley on 02/24/21.   -Resumed low-dose single agent irinotecan on 04/03/2021, added low-dose 5-FU back on 05/14/21.  He has overall tolerated  well. -Restaging CT AP 07/07/21 showed no evidence of recurrent or metastatic disease in abdomen and pelvis, stable plural mets and small lung nodules, no other new lesions  -He is clinically stable. Lab reviewed, adequate for treatment, will proceed low-dose FOLFIRI today and continue every 2 weeks   2. Symptom management: Nausea, vomiting, weight loss, constipation -weight is stable lately -continue f/u with dietician  -He has had worsening constipation after chemo lately which causes nausea and vomiting. I will add compazine to his premeds today. -He is using oxycodone as needed for abdominal cramping. -He tried dulcolax and colace, but he took it only as needed. I again recommended he take colace or dulcolax more regularly, once daily for 3-5 days after chemo.   3. 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 Watt Climes showed internal hemorrhoids, benign polyps, diverticulosis and colon cancer. -He was treated with oral iron, IV Feraheme on 05/03/19 and 05/16/19 (had reaction with 2nd dose).  -With cancer recurrence his anemia recurred. Hgb went down when he restarted chemotherapy in 03/2021, recovering since 05/2021. Hgb stable in 11 range, 11.2 today (08/20/21)   4. Goal of care discussion -The patient understands the goal of care is palliative. -I previously recommended DNR, he will think about it      PLAN:  -proceed with same low dose FOLFIRI today -Lab, flush, f/u and FOLFIRI in 2 and 4 weeks   No problem-specific Assessment & Plan notes found for this encounter.   SUMMARY OF ONCOLOGIC HISTORY: Oncology History Overview Note  Cancer Staging Cancer of right colon [facility]) Staging form: Colon and Rectum, AJCC 8th Edition - Clinical stage from 04/09/2019: Stage IVC (cTX,  cNX, pM1c) - Signed by Truitt Merle, MD on 05/03/2019     Cancer of right colon [facility])  04/09/2019 Procedure   Colonoscopy 04/09/19 by Dr  Watt Climes IMPRESSION -internal hemorrhoids -Diverticulosis in the sigmoid colon  -2 small polyps in the rectum and in the proximal transverse colon, removed with a hot snare. Resected and retrieved.  -3 medium polyps in the proximal transverse colon, in the mid transverse colon and in the distal transverse colon, removed and resected and retrieved.  -likely malignant partially obstructing tumor at the hepatic flexure. biopsied, tattooed.  -1 large polyp in the mid ascending colon  -the examination was otherwise normal     04/09/2019 Initial Biopsy   FINAL MICROSCOPIC DIAGNOSIS: 04/09/19 1. LG intestine-hepatic flexure, Biopsy:   INVASIVE WELL DIFFERENTIATED ADENOCARCINOMA   04/09/2019 Cancer Staging   Staging form: Colon and Rectum, AJCC 8th Edition - Clinical stage from 04/09/2019: Stage IVC (cTX, cNX, pM1c) - Signed by Truitt Merle, MD on 05/03/2019   04/10/2019 Imaging   CT AP 04/10/19  IMPRESSION: 1. There is an eccentric mass of the colon involving the ascending colon near the hepatic flexure measuring approximately 3.4 x 3.4 by 2.0 cm (series 2, image 37, series 3, image 37). There is extensive soft tissue nodularity of the mesocolon and omentum, and likely areas of the peritoneum, for example bilateral upper quadrants (series 2, image 25). Findings are consistent with primary colon malignancy, probable omental and peritoneal involvement, and small volume malignant ascites. 2.  Other chronic and incidental findings as detailed above.   04/20/2019 Initial Diagnosis   Cancer of right colon ([facility])   04/26/2019 Imaging   CT Chest 04/26/19 IMPRESSION: 1. Borderline to mild lower thoracic adenopathy, including within the right internal mammary and juxta cardiophrenic stations. Given the appearance of the upper abdomen, suspicious for nodal metastasis. 2. A low right paratracheal node is borderline sized, but favored to be reactive. 3. No evidence of pulmonary metastasis. 4. Peritoneal  metastasis and abdominal ascites, as before. 5. Pancreatic parenchymal calcifications indicative of chronic calcific pancreatitis. 6. Coronary artery atherosclerosis.   04/27/2019 Pathology Results   Diagnosis 04/27/19 Peritoneum, biopsy, right upper quadrant, perihepatic - ADENOCARCINOMA, CONSISTENT WITH COLONIC PRIMARY. - SEE COMMENT.   05/16/2019 - 10/31/2019 Chemotherapy   First line FOLFOX every 2 weeks with Avastin starting with cycle 2 starting 05/16/19. 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 Stopped after 10/31/19 for surgery.    07/23/2019 Imaging    CT AP W Contrast  IMPRESSION: 1. Primary ascending colon mass may be minimally smaller. Peritoneal metastatic disease appears slightly improved as well. 2. Chronic calcific pancreatitis. 3. Tiny left renal stone. 4. Small left inguinal hernia contains a knuckle of unobstructed colon. 5. Enlarged prostate.   10/29/2019 Imaging   CT AP W Contrast IMPRESSION: No significant change in small ascending colon soft tissue mass and diffuse omental carcinomatosis.   No new or progressive metastatic disease identified.   Stable small left inguinal hernia containing a loop of sigmoid colon. No evidence of bowel obstruction or strangulation.   Colonic diverticulosis. No radiographic evidence of diverticulitis.   Stable enlarged prostate.   12/17/2019 Pathology Results   HIPEC Surgery by Dr Clovis Riley 12/17/19 FINAL PATHOLOGIC DIAGNOSIS  MICROSCOPIC EXAMINATION AND DIAGNOSIS  A.          "RIGHT COLON, OMENTUM, SPLEEN":  Invasive adenocarcinoma with mucinous features, moderately differentiated.  Tumor involves mesentery and spleen.  Five out of ten lymph nodes involved by adenocarcinoma with perinodal soft tissue involvement (5/10), see comment.  Margins are uninvolved  by invasive carcinoma.  Ileum and appendix, uninvolved.  See comment and cancer case summary.  B.          "ROUND LIGAMENT OF LIVER":       Involved by adenocarcinoma with mucinous features, moderately differentiated.  C.          "RIGHT DIAPHRAM STRIPPING":       Negative for carcinoma.  D.          "RIGHT HEPATIC CAPSULECTOMY":       Chronic inflammation and fibrosis.  Negative for carcinoma.  E.          "DEBRIDED TUMOR":       Involved by adenocarcinoma with mucinous features, moderately differentiated.   COMMENT: Sections disclose five out of ten lymph nodes involved by adenocarcinoma with perinodal soft tissue involvement. However, it is not possible to be certain if this represents lymph nodes replaced by tumor and/or soft tissue involvement. In addition, focal perineural invasion is seen.   04/03/2020 Imaging   CT AP W contrast  IMPRESSION: 1. Status post interval splenectomy and right hemicolectomy. 2. Response to therapy of omental/peritoneal metastasis. The only residual indeterminate finding is fluid density along the capsule of the hepatic dome which could be postoperative or secondary to localized residual peritoneal disease. 3. Left nephrolithiasis. 4.  Possible constipation. 5. Prostatomegaly. 6.  Aortic Atherosclerosis (ICD10-I70.0).   07/11/2020 Imaging   CT AP  1.  Postsurgical changes of subtle reductive surgery including right hemicolectomy, omentectomy, splenectomy, and right hepatic capsulectomy.  2.  Interval development of multiple areas of low attenuation involving the liver, as described above. This the intraparenchymal low-density lesion is concerning for metastatic disease. Other disease along the capsule may be post therapy related. Residual thickening of the anterior peritoneum particularly anterior to the right lobe of the liver is also concerning for some residual disease although appears significantly improved. Recommend attention on  follow-up.  3.  No definite evidence of residual peritoneal disease status post HIPEC.   09/18/2020 -  Chemotherapy   Second line chemo FOLFIRI and bevacizumab q2weeks starting 09/18/20. Chemo on hold since 12/23/20 due to fistula. RESTART with low dose irinotecan alone on 04/03/21.   12/19/2020 Imaging   CT CAP  IMPRESSION: 1. Interval development of a 1.6 x 2.7 x 1.6 cm collection of gas and debris between the sigmoid colon and dome of bladder, compatible with small abscess. A portion of this abscess is probably intramural at the dome of bladder. Marked associated thickening of the adjacent colonic and bladder walls with tiny gas bubbles in the thickened bladder wall and prominent amount of free gas in the bladder lumen suggests an associated colovesical fistula. Tiny gas bubbles are also seen in the central prostate gland and may be in the urethra although parenchymal gas within the prostate gland is not excluded on this exam. 2. Nodularity along the major and minor fissures of the right hemithorax has decreased in the interval, nearly imperceptible today. 3. Peritoneal implants along the liver, right abdomen, and deep to the left paramidline anterior abdominal wall have resolved in the interval by CT imaging. 4. Tiny right lower lobe pulmonary nodule is more conspicuous today. Attention on follow-up recommended. 5. Stable 4 mm hypodensity in the dome of the right liver, too small  to characterize. 6. Nonobstructing left renal stones with bilateral renal cysts. 7. Stable compression deformity at multiple thoracic and lumbar levels. 8. Aortic Atherosclerosis (ICD10-I70.0).   02/24/2021 Surgery   LAPAROTOMY EXPLORATORY resection of sigmoid colon and proximal rectum, takedown of colovesical fistula, creation of end ileosotmy by Dr Clovis Riley    Final Pathologic Diagnosis      A.  SIGMOID COLON AND RECTUM, RESECTION:               Invasive adenocarcinoma with mucinous features, moderately  differentiated.               Tumor measures 3.2 cm in greatest dimension. Tumor invades visceral peritoneum.  Lymphovascular invasion is present.               Metastatic adenocarcinoma involving seven (of 22) lymph nodes (7/22).  One margin involved by adenocarcinoma (grossly presumed distal margin).     03/25/2021 Imaging   IMPRESSION: 1. New bilateral pulmonary nodules worrisome for new pulmonary metastatic disease. 2. New small right pleural effusion with overlying atelectasis. 3. Recurrent pleural nodularity on the right. 4. No findings for abdominal/pelvic metastatic disease. 5. Stable severe atrophy of the pancreatic body and tail with associated main pancreatic duct dilatation. No obvious obstructing pancreatic lesion but this was not present on the prior CT scan from 04/03/2020. It may be due to a stricture or a small ductal lesion, not well seen. MRI abdomen without and with contrast may be helpful for further evaluation of this finding. 6. Stable surgical changes involving the colon with a Hartmann's pouch and left lower quadrant colostomy. 7. Stable enlarged prostate gland. 8. Stable thoracic and lumbar compression fractures.   07/07/2021 Imaging   CT AP  IMPRESSION: 1. Redemonstrated postoperative findings of right hemicolectomy and ileocolic anastomosis as well as Hartmann procedure sigmoid colon resection and left lower quadrant end colostomy. 2. No evidence of recurrent or metastatic disease in the abdomen or pelvis. 3. Multiple small pulmonary nodules are not significantly changed compared to prior examination. There is unchanged thickening and nodularity along the pleural surfaces of the right lung. Findings remain consistent with pulmonary parenchymal and pleural metastatic disease. 4. Small right pleural effusion and associated atelectasis or consolidation, slightly increased compared to prior examination. 5. Status post splenectomy and cholecystectomy. 6.  Prostatomegaly. 7. Coronary artery disease.   Aortic Atherosclerosis (ICD10-I70.0).      INTERVAL HISTORY:  Nathaniel Norton is here for a follow up of metastatic colon cancer. He was last seen by me on 08/06/21. He presents to the clinic alone. He reports continued vomiting following infusion. He reports the colace does not seem to help his constipation. He notes he tried dulcolax, and it helped the following day. He again only took it as needed. When he is not constipated, he does not vomit-- "as long as I'm not backed up, I don't vomit." The constipation is worse after chemo. He notes because of this, his bowel movements are irregular.   All other systems were reviewed with the patient and are negative.  MEDICAL HISTORY:  Past Medical History:  Diagnosis Date   colon ca dx'd 03/2019    SURGICAL HISTORY: Past Surgical History:  Procedure Laterality Date   CATARACT EXTRACTION     IR IMAGING GUIDED PORT INSERTION  05/10/2019   L4 fracture  2012   RETINAL DETACHMENT SURGERY     Right hand surgery  1974    I have reviewed the social history and family history with the  patient and they are unchanged from previous note.  ALLERGIES:  is allergic to feraheme [ferumoxytol] and codeine.  MEDICATIONS:  Current Outpatient Medications  Medication Sig Dispense Refill   diphenoxylate-atropine (LOMOTIL) 2.5-0.025 MG tablet Take 1-2 tablets by mouth 4 (four) times daily as needed for diarrhea or loose stools. 90 tablet 1   fluticasone (FLONASE) 50 MCG/ACT nasal spray Place into the nose.     lidocaine-prilocaine (EMLA) cream Apply 1 application topically as needed. 30 g 1   Multiple Vitamin (MULTIVITAMIN) tablet Take 1 tablet by mouth daily.     MULTIPLE VITAMIN PO      ondansetron (ZOFRAN) 8 MG tablet Take 1 tablet (8 mg total) by mouth every 8 (eight) hours as needed for nausea or vomiting. Start on day 3 after chemotherapy 20 tablet 0   ondansetron (ZOFRAN-ODT) 4 MG disintegrating tablet  Take 4 mg by mouth every 8 (eight) hours as needed.     oxyCODONE (OXY IR/ROXICODONE) 5 MG immediate release tablet Take 1 tablet (5 mg total) by mouth every 8 (eight) hours as needed for severe pain. 90 tablet 0   prochlorperazine (COMPAZINE) 10 MG tablet Take 1 tablet (10 mg total) by mouth every 6 (six) hours as needed (Nausea or vomiting). 30 tablet 2   sodium chloride flush 0.9 % SOLN injection      No current facility-administered medications for this visit.   Facility-Administered Medications Ordered in Other Visits  Medication Dose Route Frequency Provider Last Rate Last Admin   sodium chloride flush (NS) 0.9 % injection 10 mL  10 mL Intracatheter PRN Truitt Merle, MD   10 mL at 05/30/21 1145    PHYSICAL EXAMINATION: ECOG PERFORMANCE STATUS: 1 - Symptomatic but completely ambulatory  Vitals:   08/20/21 0912  BP: 115/77  Pulse: 82  Resp: 18  Temp: 98.2 F (36.8 C)  SpO2: 99%   Wt Readings from Last 3 Encounters:  08/20/21 129 lb 3.2 oz (58.6 kg)  08/06/21 129 lb 9.6 oz (58.8 kg)  07/23/21 128 lb (58.1 kg)     GENERAL:alert, no distress and comfortable SKIN: skin color, texture, turgor are normal, no rashes or significant lesions EYES: normal, Conjunctiva are pink and non-injected, sclera clear  NECK: supple, thyroid normal size, non-tender, without nodularity LYMPH:  no palpable lymphadenopathy in the cervical, axillary  LUNGS: clear to auscultation and percussion with normal breathing effort HEART: regular rate & rhythm and no murmurs and no lower extremity edema ABDOMEN:abdomen soft, non-tender and normal bowel sounds Musculoskeletal:no cyanosis of digits and no clubbing  NEURO: alert & oriented x 3 with fluent speech, no focal motor/sensory deficits  LABORATORY DATA:  I have reviewed the data as listed CBC Latest Ref Rng & Units 08/20/2021 08/06/2021 07/23/2021  WBC 4.0 - 10.5 K/uL 7.2 7.2 7.5  Hemoglobin 13.0 - 17.0 g/dL 11.2(L) 11.2(L) 11.2(L)  Hematocrit 39.0 -  52.0 % 33.5(L) 32.9(L) 33.3(L)  Platelets 150 - 400 K/uL 308 273 273     CMP Latest Ref Rng & Units 08/06/2021 07/23/2021 07/09/2021  Glucose 70 - 99 mg/dL 92 99 93  BUN 8 - 23 mg/dL _0 Creatinine 0.61 - 1.24 mg/dL 0.83 0.80 0.80  Sodium 135 - 145 mmol/L 140 140 141  Potassium 3.5 - 5.1 mmol/L 4.2 4.1 4.2  Chloride 98 - 111 mmol/L 104 106 105  CO2 22 - 32 mmol/L _1 Calcium 8.9 - 10.3 mg/dL 9.5 9.2 9.6  Total Protein 6.5 - 8.1 g/dL  6.9 6.6 6.6  Total Bilirubin 0.3 - 1.2 mg/dL 0.6 0.5 0.8  Alkaline Phos 38 - 126 U/L 91 88 85  AST 15 - 41 U/L _0 ALT 0 - 44 U/L _1 RADIOGRAPHIC STUDIES: I have personally reviewed the radiological images as listed and agreed with the findings in the report. No results found.    No orders of the defined types were placed in this encounter.  All questions were answered. The patient knows to call the clinic with any problems, questions or concerns. No barriers to learning was detected. The total time spent in the appointment was 30 minutes.     Truitt Merle, MD 08/20/2021   I, Wilburn Mylar, am acting as scribe for Truitt Merle, MD.   I have reviewed the above documentation for accuracy and completeness, and I agree with the above.

## 2021-08-22 ENCOUNTER — Inpatient Hospital Stay: Payer: Medicare Other

## 2021-08-22 ENCOUNTER — Other Ambulatory Visit: Payer: Self-pay

## 2021-08-22 VITALS — BP 125/58 | HR 75 | Temp 97.3°F | Resp 18

## 2021-08-22 DIAGNOSIS — D500 Iron deficiency anemia secondary to blood loss (chronic): Secondary | ICD-10-CM

## 2021-08-22 DIAGNOSIS — Z95828 Presence of other vascular implants and grafts: Secondary | ICD-10-CM

## 2021-08-22 MED ORDER — SODIUM CHLORIDE 0.9% FLUSH: 10.0000 mL | INTRAVENOUS | Status: DC | PRN

## 2021-08-22 MED ORDER — HEPARIN SOD (PORK) LOCK FLUSH 100 UNIT/ML IV SOLN: 500.0000 [IU] | Freq: Once | INTRAVENOUS | Status: DC | PRN

## 2021-08-25 DIAGNOSIS — S32040D Wedge compression fracture of fourth lumbar vertebra, subsequent encounter for fracture with routine healing: Secondary | ICD-10-CM | POA: Diagnosis not present

## 2021-08-25 DIAGNOSIS — C189 Malignant neoplasm of colon, unspecified: Secondary | ICD-10-CM | POA: Diagnosis not present

## 2021-08-25 DIAGNOSIS — M810 Age-related osteoporosis without current pathological fracture: Secondary | ICD-10-CM | POA: Diagnosis not present

## 2021-08-25 DIAGNOSIS — Z933 Colostomy status: Secondary | ICD-10-CM | POA: Diagnosis not present

## 2021-08-25 DIAGNOSIS — K5901 Slow transit constipation: Secondary | ICD-10-CM | POA: Diagnosis not present

## 2021-08-25 DIAGNOSIS — C801 Malignant (primary) neoplasm, unspecified: Secondary | ICD-10-CM | POA: Diagnosis not present

## 2021-08-25 DIAGNOSIS — Z23 Encounter for immunization: Secondary | ICD-10-CM | POA: Diagnosis not present

## 2021-09-02 MED FILL — Dexamethasone Sodium Phosphate Inj 100 MG/10ML: INTRAMUSCULAR | Qty: 1 | Status: AC

## 2021-09-03 ENCOUNTER — Inpatient Hospital Stay: Payer: Medicare Other

## 2021-09-03 ENCOUNTER — Inpatient Hospital Stay (HOSPITAL_BASED_OUTPATIENT_CLINIC_OR_DEPARTMENT_OTHER): Payer: Medicare Other | Admitting: Hematology

## 2021-09-03 ENCOUNTER — Other Ambulatory Visit: Payer: Self-pay

## 2021-09-03 ENCOUNTER — Encounter: Payer: Self-pay | Admitting: Hematology

## 2021-09-03 VITALS — BP 123/77 | HR 66 | Temp 98.0°F | Resp 17 | Ht 66.0 in | Wt 130.8 lb

## 2021-09-03 DIAGNOSIS — R634 Abnormal weight loss: Secondary | ICD-10-CM | POA: Diagnosis not present

## 2021-09-03 DIAGNOSIS — Z5111 Encounter for antineoplastic chemotherapy: Secondary | ICD-10-CM | POA: Diagnosis not present

## 2021-09-03 DIAGNOSIS — C182 Malignant neoplasm of ascending colon: Secondary | ICD-10-CM

## 2021-09-03 DIAGNOSIS — C786 Secondary malignant neoplasm of retroperitoneum and peritoneum: Secondary | ICD-10-CM | POA: Diagnosis not present

## 2021-09-03 DIAGNOSIS — R112 Nausea with vomiting, unspecified: Secondary | ICD-10-CM | POA: Diagnosis not present

## 2021-09-03 DIAGNOSIS — D630 Anemia in neoplastic disease: Secondary | ICD-10-CM | POA: Diagnosis not present

## 2021-09-03 DIAGNOSIS — D500 Iron deficiency anemia secondary to blood loss (chronic): Secondary | ICD-10-CM

## 2021-09-03 DIAGNOSIS — Z95828 Presence of other vascular implants and grafts: Secondary | ICD-10-CM

## 2021-09-03 DIAGNOSIS — Z923 Personal history of irradiation: Secondary | ICD-10-CM | POA: Diagnosis not present

## 2021-09-03 DIAGNOSIS — K5900 Constipation, unspecified: Secondary | ICD-10-CM | POA: Diagnosis not present

## 2021-09-03 LAB — CBC WITH DIFFERENTIAL (CANCER CENTER ONLY)
Abs Immature Granulocytes: 0.01 10*3/uL (ref 0.00–0.07)
Basophils Absolute: 0 10*3/uL (ref 0.0–0.1)
Basophils Relative: 1 %
Eosinophils Absolute: 0.1 10*3/uL (ref 0.0–0.5)
Eosinophils Relative: 2 %
HCT: 31.2 % — ABNORMAL LOW (ref 39.0–52.0)
Hemoglobin: 10.6 g/dL — ABNORMAL LOW (ref 13.0–17.0)
Immature Granulocytes: 0 %
Lymphocytes Relative: 38 %
Lymphs Abs: 2.5 10*3/uL (ref 0.7–4.0)
MCH: 29 pg (ref 26.0–34.0)
MCHC: 34 g/dL (ref 30.0–36.0)
MCV: 85.2 fL (ref 80.0–100.0)
Monocytes Absolute: 0.9 10*3/uL (ref 0.1–1.0)
Monocytes Relative: 13 %
Neutro Abs: 3 10*3/uL (ref 1.7–7.7)
Neutrophils Relative %: 46 %
Platelet Count: 287 10*3/uL (ref 150–400)
RBC: 3.66 MIL/uL — ABNORMAL LOW (ref 4.22–5.81)
RDW: 16.6 % — ABNORMAL HIGH (ref 11.5–15.5)
WBC Count: 6.5 10*3/uL (ref 4.0–10.5)
nRBC: 0 % (ref 0.0–0.2)

## 2021-09-03 LAB — CMP (CANCER CENTER ONLY)
ALT: 35 U/L (ref 0–44)
AST: 31 U/L (ref 15–41)
Albumin: 3.3 g/dL — ABNORMAL LOW (ref 3.5–5.0)
Alkaline Phosphatase: 99 U/L (ref 38–126)
Anion gap: 9 (ref 5–15)
BUN: 12 mg/dL (ref 8–23)
CO2: 28 mmol/L (ref 22–32)
Calcium: 9.5 mg/dL (ref 8.9–10.3)
Chloride: 105 mmol/L (ref 98–111)
Creatinine: 0.86 mg/dL (ref 0.61–1.24)
GFR, Estimated: >60 mL/min (ref >60)
Glucose, Bld: 96 mg/dL (ref 70–99)
Potassium: 4 mmol/L (ref 3.5–5.1)
Sodium: 142 mmol/L (ref 135–145)
Total Bilirubin: 0.4 mg/dL (ref 0.3–1.2)
Total Protein: 7 g/dL (ref 6.5–8.1)

## 2021-09-03 MED ORDER — SODIUM CHLORIDE 0.9% FLUSH
10.0000 mL | INTRAVENOUS | Status: DC | PRN
  Administered 2021-09-03: 10 mL

## 2021-09-03 MED ORDER — SODIUM CHLORIDE 0.9 % IV SOLN
10.0000 mg | Freq: Once | INTRAVENOUS | Status: AC
  Administered 2021-09-03: 10 mg via INTRAVENOUS
  Filled 2021-09-03: qty 10

## 2021-09-03 MED ORDER — SODIUM CHLORIDE 0.9% FLUSH: 10.0000 mL | INTRAVENOUS | Status: DC | PRN

## 2021-09-03 MED ORDER — SODIUM CHLORIDE 0.9 % IV SOLN
400.0000 mg/m2 | Freq: Once | INTRAVENOUS | Status: AC
  Administered 2021-09-03: 652 mg via INTRAVENOUS
  Filled 2021-09-03: qty 32.6

## 2021-09-03 MED ORDER — SODIUM CHLORIDE 0.9 % IV SOLN
2000.0000 mg/m2 | INTRAVENOUS | Status: DC
  Administered 2021-09-03: 3250 mg via INTRAVENOUS
  Filled 2021-09-03: qty 65

## 2021-09-03 MED ORDER — HEPARIN SOD (PORK) LOCK FLUSH 100 UNIT/ML IV SOLN: 500.0000 [IU] | Freq: Once | INTRAVENOUS | Status: DC | PRN

## 2021-09-03 MED ORDER — PROCHLORPERAZINE MALEATE 10 MG PO TABS
10.0000 mg | ORAL_TABLET | Freq: Once | ORAL | Status: AC
  Administered 2021-09-03: 10 mg via ORAL
  Filled 2021-09-03: qty 1

## 2021-09-03 MED ORDER — SODIUM CHLORIDE 0.9 % IV SOLN: Freq: Once | INTRAVENOUS | Status: AC

## 2021-09-03 MED ORDER — SODIUM CHLORIDE 0.9 % IV SOLN
110.0000 mg/m2 | Freq: Once | INTRAVENOUS | Status: AC
  Administered 2021-09-03: 180 mg via INTRAVENOUS
  Filled 2021-09-03: qty 9

## 2021-09-03 MED ORDER — ATROPINE SULFATE 1 MG/ML IJ SOLN
0.5000 mg | Freq: Once | INTRAMUSCULAR | Status: AC | PRN
  Administered 2021-09-03: 0.5 mg via INTRAVENOUS
  Filled 2021-09-03: qty 1

## 2021-09-03 MED ORDER — PALONOSETRON HCL INJECTION 0.25 MG/5ML
0.2500 mg | Freq: Once | INTRAVENOUS | Status: AC
Start: 2021-09-03 — End: 2021-09-03
  Administered 2021-09-03: 0.25 mg via INTRAVENOUS
  Filled 2021-09-03: qty 5

## 2021-09-03 NOTE — Patient Instructions (Signed)
[facility]   Discharge Instructions: Thank you for choosing [facility] to provide your oncology and hematology care.   If you have a lab appointment with the [facility], please go directly to the [facility] and check in at the registration area.   Wear comfortable clothing and clothing appropriate for easy access to any Portacath or PICC line.   We strive to give you quality time with your provider. You may need to reschedule your appointment if you arrive late (15 or more minutes).  Arriving late affects you and other patients whose appointments are after yours.  Also, if you miss three or more appointments without notifying the office, you may be dismissed from the clinic at the provider's discretion.      For prescription refill requests, have your pharmacy contact our office and allow 72 hours for refills to be completed.    Today you received the following chemotherapy and/or immunotherapy agents: Camptosar, Leucovorin, and Fluoroutacil.      To help prevent nausea and vomiting after your treatment, we encourage you to take your nausea medication as directed.  BELOW ARE SYMPTOMS THAT SHOULD BE REPORTED IMMEDIATELY: *FEVER GREATER THAN 100.4 F (38 C) OR HIGHER *CHILLS OR SWEATING *NAUSEA AND VOMITING THAT IS NOT CONTROLLED WITH YOUR NAUSEA MEDICATION *UNUSUAL SHORTNESS OF BREATH *UNUSUAL BRUISING OR BLEEDING *URINARY PROBLEMS (pain or burning when urinating, or frequent urination) *BOWEL PROBLEMS (unusual diarrhea, constipation, pain near the anus) TENDERNESS IN MOUTH AND THROAT WITH OR WITHOUT PRESENCE OF ULCERS (sore throat, sores in mouth, or a toothache) UNUSUAL RASH, SWELLING OR PAIN  UNUSUAL VAGINAL DISCHARGE OR ITCHING   Items with * indicate a potential emergency and should be followed up as soon as possible or go to the Emergency Department if any problems should occur.  Please show the CHEMOTHERAPY ALERT CARD or  IMMUNOTHERAPY ALERT CARD at check-in to the Emergency Department and triage nurse.  Should you have questions after your visit or need to cancel or reschedule your appointment, please contact [facility]  Dept: [phone]  and follow the prompts.  Office hours are 8:00 a.m. to 4:30 p.m. Monday - Friday. Please note that voicemails left after 4:00 p.m. may not be returned until the following business day.  We are closed weekends and major holidays. You have access to a nurse at all times for urgent questions. Please call the main number to the clinic Dept: [phone] and follow the prompts.   For any non-urgent questions, you may also contact your provider using MyChart. We now offer e-Visits for anyone 23 and older to request care online for non-urgent symptoms. For details visit [email or web address].   Also download the MyChart app! Go to the app store, search "MyChart", open the app, select [facility], and log in with your MyChart username and password.  Due to Covid, a mask is required upon entering the hospital/clinic. If you do not have a mask, one will be given to you upon arrival. For doctor visits, patients may have 1 support person aged 56 or older with them. For treatment visits, patients cannot have anyone with them due to current Covid guidelines and our immunocompromised population.

## 2021-09-03 NOTE — Progress Notes (Signed)
Nathaniel Norton   [phone] [phone]   Clinic Follow up Note   Patient Care Team: Nathaniel Manes, MD as PCP - General (Internal Medicine) Nathaniel Mull, MD as Consulting Physician (Family Medicine) Nathaniel Essex, MD as Consulting Physician (Gastroenterology) Nathaniel Merle, MD as Consulting Physician (Oncology)  Date of Service:  09/03/2021  CHIEF COMPLAINT: f/u of metastatic colon cancer  CURRENT THERAPY:  Second line chemo FOLFIRI and bevacizumab q2weeks starting 09/18/20. Chemo on hold since 12/23/20 due to fistula. RESTART with low dose on 04/03/21.  ASSESSMENT & PLAN:  Nathaniel Norton is a 70 y.o. male with   1. Cancer of right colon, with peritoneal and lung metastasis, stage IV,  MMR normal, KRAS mutation (+), MSS -He was diagnosed in 03/2019. His Colonoscopy biopsy showed adenocarcinoma of right hepatic flexure colon. His peritoneal biopsy from 04/27/19 shows adenocarcinoma, consistent with metastasis of his colon cancer. This is now stage IV disease. -He was treated with first-line FOLFOX in June-November 2020 before proceeding with HIPEC surgery by Dr. Clovis Norton on 12/17/19.  -09/10/20 PET showed disease progression with hypermetabolic pleural (right) and peritoneal nodules. A biopsy was not felt to be necessary given his previously known peritoneal metastasis -He started second line FOLFIRI and bevacizumab q2weeks on 09/18/20, tolerated first 2 cycles poorly with abdominal pain/cramping and diarrhea.  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 Clovis Norton on 02/24/21.   -Resumed low-dose single agent irinotecan on 04/03/2021, added low-dose 5-FU back on 05/14/21.  He has overall tolerated  well. -Restaging CT AP 07/07/21 showed no evidence of recurrent or metastatic disease in abdomen and pelvis, stable plural mets and small lung nodules, no other new lesions  -He is clinically stable. Lab reviewed, his CEA has been slowly increasing. It recently jumped from 5.06 on 07/23/21 to 9.44 on 08/20/21. I explained that this could be an indicator of tumor growth, but it is not definitive. Given he is currently on low doses of treatment, we will try to slowly increase his dosage, starting with a 10% increase of irinotecan today. He agrees, we reviewed AE management again  -labs from today are adequate for treatment, will proceed FOLFIRI today and continue every 2 weeks -restaging CT at the end of Oct    2. Symptom management: Nausea, vomiting, weight loss, constipation -weight is stable lately -continue f/u with dietician  -He has had worsening constipation after chemo lately which causes nausea and vomiting. I will add compazine to his premeds today. -He is using oxycodone as needed for abdominal cramping. -He tried dulcolax and colace, but he took it only as needed. I again recommended he take colace or dulcolax more regularly, once daily for 3-5 days after chemo.   3. 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 Watt Norton showed internal hemorrhoids, benign polyps, diverticulosis and colon cancer. -He was treated with oral iron, IV Feraheme on 05/03/19 and 05/16/19 (had reaction with 2nd dose).  -With cancer recurrence his anemia recurred. Hgb went down when he restarted chemotherapy in 03/2021, recovering since 05/2021. Hgb stable in 11 range, down slightly to 10.6 today (09/03/21)   4. Goal of care discussion -The patient understands the goal of care is  palliative. -I previously recommended DNR, he will think about it      PLAN:  -proceed with FOLFIRI today, with 10% increase in irinotecan dose -Lab, flush, f/u and FOLFIRI in 2 and  4 weeks   No problem-specific Assessment & Plan notes found for this encounter.   SUMMARY OF ONCOLOGIC HISTORY: Oncology History Overview Note  Cancer Staging Cancer of right colon [facility]) Staging form: Colon and Rectum, AJCC 8th Edition - Clinical stage from 04/09/2019: Stage IVC (cTX, cNX, pM1c) - Signed by Nathaniel Merle, MD on 05/03/2019     Cancer of right colon [facility])  04/09/2019 Procedure   Colonoscopy 04/09/19 by Dr Nathaniel Norton IMPRESSION -internal hemorrhoids -Diverticulosis in the sigmoid colon  -2 small polyps in the rectum and in the proximal transverse colon, removed with a hot snare. Resected and retrieved.  -3 medium polyps in the proximal transverse colon, in the mid transverse colon and in the distal transverse colon, removed and resected and retrieved.  -likely malignant partially obstructing tumor at the hepatic flexure. biopsied, tattooed.  -1 large polyp in the mid ascending colon  -the examination was otherwise normal     04/09/2019 Initial Biopsy   FINAL MICROSCOPIC DIAGNOSIS: 04/09/19 1. LG intestine-hepatic flexure, Biopsy:   INVASIVE WELL DIFFERENTIATED ADENOCARCINOMA   04/09/2019 Cancer Staging   Staging form: Colon and Rectum, AJCC 8th Edition - Clinical stage from 04/09/2019: Stage IVC (cTX, cNX, pM1c) - Signed by Nathaniel Merle, MD on 05/03/2019   04/10/2019 Imaging   CT AP 04/10/19  IMPRESSION: 1. There is an eccentric mass of the colon involving the ascending colon near the hepatic flexure measuring approximately 3.4 x 3.4 by 2.0 cm (series 2, image 37, series 3, image 37). There is extensive soft tissue nodularity of the mesocolon and omentum, and likely areas of the peritoneum, for example bilateral upper quadrants (series 2, image 25). Findings are consistent with primary colon malignancy, probable omental and peritoneal involvement, and small volume malignant ascites. 2.  Other chronic and incidental findings as detailed above.   04/20/2019 Initial Diagnosis    Cancer of right colon ([place])   04/26/2019 Imaging   CT Chest 04/26/19 IMPRESSION: 1. Borderline to mild lower thoracic adenopathy, including within the right internal mammary and juxta cardiophrenic stations. Given the appearance of the upper abdomen, suspicious for nodal metastasis. 2. A low right paratracheal node is borderline sized, but favored to be reactive. 3. No evidence of pulmonary metastasis. 4. Peritoneal metastasis and abdominal ascites, as before. 5. Pancreatic parenchymal calcifications indicative of chronic calcific pancreatitis. 6. Coronary artery atherosclerosis.   04/27/2019 Pathology Results   Diagnosis 04/27/19 Peritoneum, biopsy, right upper quadrant, perihepatic - ADENOCARCINOMA, CONSISTENT WITH COLONIC PRIMARY. - SEE COMMENT.   05/16/2019 - 10/31/2019 Chemotherapy   First line FOLFOX every 2 weeks with Avastin starting with cycle 2 starting 05/16/19. 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 Stopped after 10/31/19 for surgery.    07/23/2019 Imaging    CT AP W Contrast  IMPRESSION: 1. Primary ascending colon mass may be minimally smaller. Peritoneal metastatic disease appears slightly improved as well. 2. Chronic calcific pancreatitis. 3. Tiny left renal stone. 4. Small left inguinal hernia contains a knuckle of unobstructed colon. 5. Enlarged prostate.   10/29/2019 Imaging   CT AP W Contrast IMPRESSION: No significant change in small ascending colon soft tissue mass and diffuse omental carcinomatosis.  No new or progressive metastatic disease identified.   Stable small left inguinal hernia containing a loop of sigmoid colon. No evidence of bowel obstruction or strangulation.   Colonic diverticulosis. No radiographic evidence of diverticulitis.   Stable enlarged prostate.   12/17/2019  Pathology Results   HIPEC Surgery by Dr Nathaniel Norton 12/17/19 FINAL PATHOLOGIC DIAGNOSIS  MICROSCOPIC EXAMINATION AND DIAGNOSIS  A.          "RIGHT COLON, OMENTUM, SPLEEN":       Invasive adenocarcinoma with mucinous features, moderately differentiated.  Tumor involves mesentery and spleen.  Five out of ten lymph nodes involved by adenocarcinoma with perinodal soft tissue involvement (5/10), see comment.  Margins are uninvolved by invasive carcinoma.  Ileum and appendix, uninvolved.  See comment and cancer case summary.  B.          "ROUND LIGAMENT OF LIVER":       Involved by adenocarcinoma with mucinous features, moderately differentiated.  C.          "RIGHT DIAPHRAM STRIPPING":       Negative for carcinoma.  D.          "RIGHT HEPATIC CAPSULECTOMY":       Chronic inflammation and fibrosis.  Negative for carcinoma.  E.          "DEBRIDED TUMOR":       Involved by adenocarcinoma with mucinous features, moderately differentiated.   COMMENT: Sections disclose five out of ten lymph nodes involved by adenocarcinoma with perinodal soft tissue involvement. However, it is not possible to be certain if this represents lymph nodes replaced by tumor and/or soft tissue involvement. In addition, focal perineural invasion is seen.   04/03/2020 Imaging   CT AP W contrast  IMPRESSION: 1. Status post interval splenectomy and right hemicolectomy. 2. Response to therapy of omental/peritoneal metastasis. The only residual indeterminate finding is fluid density along the capsule of the hepatic dome which could be postoperative or secondary to localized residual peritoneal disease. 3. Left nephrolithiasis. 4.  Possible constipation. 5. Prostatomegaly. 6.  Aortic Atherosclerosis (ICD10-I70.0).   07/11/2020 Imaging   CT AP  1.  Postsurgical changes of subtle reductive surgery including right hemicolectomy, omentectomy, splenectomy, and right hepatic capsulectomy.  2.  Interval development of  multiple areas of low attenuation involving the liver, as described above. This the intraparenchymal low-density lesion is concerning for metastatic disease. Other disease along the capsule may be post therapy related. Residual thickening of the anterior peritoneum particularly anterior to the right lobe of the liver is also concerning for some residual disease although appears significantly improved. Recommend attention on follow-up.  3.  No definite evidence of residual peritoneal disease status post HIPEC.   09/18/2020 -  Chemotherapy   Second line chemo FOLFIRI and bevacizumab q2weeks starting 09/18/20. Chemo on hold since 12/23/20 due to fistula. RESTART with low dose irinotecan alone on 04/03/21.   12/19/2020 Imaging   CT CAP  IMPRESSION: 1. Interval development of a 1.6 x 2.7 x 1.6 cm collection of gas and debris between the sigmoid colon and dome of bladder, compatible with small abscess. A portion of this abscess is probably intramural at the dome of bladder. Marked associated thickening of the adjacent colonic and bladder walls with tiny gas bubbles in the thickened bladder wall and prominent amount of free gas in the bladder lumen suggests an associated colovesical fistula. Tiny gas bubbles are also seen in the central prostate gland and may be in the urethra although parenchymal gas within  the prostate gland is not excluded on this exam. 2. Nodularity along the major and minor fissures of the right hemithorax has decreased in the interval, nearly imperceptible today. 3. Peritoneal implants along the liver, right abdomen, and deep to the left paramidline anterior abdominal wall have resolved in the interval by CT imaging. 4. Tiny right lower lobe pulmonary nodule is more conspicuous today. Attention on follow-up recommended. 5. Stable 4 mm hypodensity in the dome of the right liver, too small to characterize. 6. Nonobstructing left renal stones with bilateral renal cysts. 7. Stable  compression deformity at multiple thoracic and lumbar levels. 8. Aortic Atherosclerosis (ICD10-I70.0).   02/24/2021 Surgery   LAPAROTOMY EXPLORATORY resection of sigmoid colon and proximal rectum, takedown of colovesical fistula, creation of end ileosotmy by Dr Nathaniel Norton    Final Pathologic Diagnosis      A.  SIGMOID COLON AND RECTUM, RESECTION:               Invasive adenocarcinoma with mucinous features, moderately differentiated.               Tumor measures 3.2 cm in greatest dimension. Tumor invades visceral peritoneum.  Lymphovascular invasion is present.               Metastatic adenocarcinoma involving seven (of 22) lymph nodes (7/22).  One margin involved by adenocarcinoma (grossly presumed distal margin).     03/25/2021 Imaging   IMPRESSION: 1. New bilateral pulmonary nodules worrisome for new pulmonary metastatic disease. 2. New small right pleural effusion with overlying atelectasis. 3. Recurrent pleural nodularity on the right. 4. No findings for abdominal/pelvic metastatic disease. 5. Stable severe atrophy of the pancreatic body and tail with associated main pancreatic duct dilatation. No obvious obstructing pancreatic lesion but this was not present on the prior CT scan from 04/03/2020. It may be due to a stricture or a small ductal lesion, not well seen. MRI abdomen without and with contrast may be helpful for further evaluation of this finding. 6. Stable surgical changes involving the colon with a Hartmann's pouch and left lower quadrant colostomy. 7. Stable enlarged prostate gland. 8. Stable thoracic and lumbar compression fractures.   07/07/2021 Imaging   CT AP  IMPRESSION: 1. Redemonstrated postoperative findings of right hemicolectomy and ileocolic anastomosis as well as Hartmann procedure sigmoid colon resection and left lower quadrant end colostomy. 2. No evidence of recurrent or metastatic disease in the abdomen or pelvis. 3. Multiple small pulmonary  nodules are not significantly changed compared to prior examination. There is unchanged thickening and nodularity along the pleural surfaces of the right lung. Findings remain consistent with pulmonary parenchymal and pleural metastatic disease. 4. Small right pleural effusion and associated atelectasis or consolidation, slightly increased compared to prior examination. 5. Status post splenectomy and cholecystectomy. 6. Prostatomegaly. 7. Coronary artery disease.   Aortic Atherosclerosis (ICD10-I70.0).      INTERVAL HISTORY:  Nathaniel Norton is here for a follow up of metastatic colon cancer. He was last seen by me on 08/20/21. He presents to the clinic alone. He reports he continues to struggle with his bowel movements. We had previously discussed he try taking the colace or dulcolax daily after treatment, which he has not done yet. He denies any nausea or vomiting with his last cycle. He notes his appetite has been good. He notes he is scheduled for repeat DEXA in the near future.   All other systems were reviewed with the patient and are negative.  MEDICAL HISTORY:  Past  Medical History:  Diagnosis Date   colon ca dx'd 03/2019    SURGICAL HISTORY: Past Surgical History:  Procedure Laterality Date   CATARACT EXTRACTION     IR IMAGING GUIDED PORT INSERTION  05/10/2019   L4 fracture  2012   RETINAL DETACHMENT SURGERY     Right hand surgery  1974    I have reviewed the social history and family history with the patient and they are unchanged from previous note.  ALLERGIES:  is allergic to feraheme [ferumoxytol] and codeine.  MEDICATIONS:  Current Outpatient Medications  Medication Sig Dispense Refill   diphenoxylate-atropine (LOMOTIL) 2.5-0.025 MG tablet Take 1-2 tablets by mouth 4 (four) times daily as needed for diarrhea or loose stools. 90 tablet 1   fluticasone (FLONASE) 50 MCG/ACT nasal spray Place into the nose.     lidocaine-prilocaine (EMLA) cream Apply 1  application topically as needed. 30 g 1   Multiple Vitamin (MULTIVITAMIN) tablet Take 1 tablet by mouth daily.     MULTIPLE VITAMIN PO      ondansetron (ZOFRAN) 8 MG tablet Take 1 tablet (8 mg total) by mouth every 8 (eight) hours as needed for nausea or vomiting. Start on day 3 after chemotherapy 20 tablet 0   ondansetron (ZOFRAN-ODT) 4 MG disintegrating tablet Take 4 mg by mouth every 8 (eight) hours as needed.     oxyCODONE (OXY IR/ROXICODONE) 5 MG immediate release tablet Take 1 tablet (5 mg total) by mouth every 8 (eight) hours as needed for severe pain. 90 tablet 0   prochlorperazine (COMPAZINE) 10 MG tablet Take 1 tablet (10 mg total) by mouth every 6 (six) hours as needed (Nausea or vomiting). 30 tablet 2   sodium chloride flush 0.9 % SOLN injection      No current facility-administered medications for this visit.   Facility-Administered Medications Ordered in Other Visits  Medication Dose Route Frequency Provider Last Rate Last Admin   0.9 %  sodium chloride infusion   Intravenous Once Nathaniel Merle, MD       atropine injection 0.5 mg  0.5 mg Intravenous Once PRN Nathaniel Merle, MD       dexamethasone (DECADRON) 10 mg in sodium chloride 0.9 % 50 mL IVPB  10 mg Intravenous Once Nathaniel Merle, MD       fluorouracil (ADRUCIL) 3,250 mg in sodium chloride 0.9 % 85 mL chemo infusion  2,000 mg/m2 (Treatment Plan Recorded) Intravenous 1 day or 1 dose Nathaniel Merle, MD       heparin lock flush 100 unit/mL  500 Units Intracatheter Once PRN Nathaniel Merle, MD       irinotecan (CAMPTOSAR) 180 mg in sodium chloride 0.9 % 500 mL chemo infusion  110 mg/m2 (Treatment Plan Recorded) Intravenous Once Nathaniel Merle, MD       leucovorin 652 mg in sodium chloride 0.9 % 250 mL infusion  400 mg/m2 (Treatment Plan Recorded) Intravenous Once Nathaniel Merle, MD       palonosetron Leona Carry) injection 0.25 mg  0.25 mg Intravenous Once Nathaniel Merle, MD       prochlorperazine (COMPAZINE) tablet 10 mg  10 mg Oral Once Nathaniel Merle, MD       sodium  chloride flush (NS) 0.9 % injection 10 mL  10 mL Intracatheter PRN Nathaniel Merle, MD   10 mL at 05/30/21 1145   sodium chloride flush (NS) 0.9 % injection 10 mL  10 mL Intracatheter PRN Nathaniel Merle, MD        PHYSICAL EXAMINATION: ECOG PERFORMANCE  STATUS: 2 - Symptomatic, <50% confined to bed  Vitals:   09/03/21 0856  BP: 123/77  Pulse: 66  Resp: 17  Temp: 98 F (36.7 C)  SpO2: 99%   Wt Readings from Last 3 Encounters:  09/03/21 130 lb 12.8 oz (59.3 kg)  08/20/21 129 lb 3.2 oz (58.6 kg)  08/06/21 129 lb 9.6 oz (58.8 kg)     GENERAL:alert, no distress and comfortable SKIN: skin color normal, no rashes or significant lesions EYES: normal, Conjunctiva are pink and non-injected, sclera clear  NEURO: alert & oriented x 3 with fluent speech  LABORATORY DATA:  I have reviewed the data as listed CBC Latest Ref Rng & Units 09/03/2021 08/20/2021 08/06/2021  WBC 4.0 - 10.5 K/uL 6.5 7.2 7.2  Hemoglobin 13.0 - 17.0 g/dL 10.6(L) 11.2(L) 11.2(L)  Hematocrit 39.0 - 52.0 % 31.2(L) 33.5(L) 32.9(L)  Platelets 150 - 400 K/uL 287 308 273     CMP Latest Ref Rng & Units 09/03/2021 08/20/2021 08/06/2021  Glucose 70 - 99 mg/dL 96 91 92  BUN 8 - 23 mg/dL _0 Creatinine 0.61 - 1.24 mg/dL 0.86 1.16 0.83  Sodium 135 - 145 mmol/L 142 141 140  Potassium 3.5 - 5.1 mmol/L 4.0 4.1 4.2  Chloride 98 - 111 mmol/L 105 105 104  CO2 22 - 32 mmol/L _1 Calcium 8.9 - 10.3 mg/dL 9.5 9.6 9.5  Total Protein 6.5 - 8.1 g/dL 7.0 7.1 6.9  Total Bilirubin 0.3 - 1.2 mg/dL 0.4 0.4 0.6  Alkaline Phos 38 - 126 U/L 99 104 91  AST 15 - 41 U/L _2 ALT 0 - 44 U/L 35 26 23      RADIOGRAPHIC STUDIES: I have personally reviewed the radiological images as listed and agreed with the findings in the report. No results found.    No orders of the defined types were placed in this encounter.  All questions were answered. The patient knows to call the clinic with any problems, questions or concerns. No barriers to  learning was detected. The total time spent in the appointment was 30 minutes.     Nathaniel Merle, MD 09/03/2021   I, Wilburn Mylar, am acting as scribe for Nathaniel Merle, MD.   I have reviewed the above documentation for accuracy and completeness, and I agree with the above.

## 2021-09-05 ENCOUNTER — Other Ambulatory Visit: Payer: Self-pay

## 2021-09-05 ENCOUNTER — Inpatient Hospital Stay: Payer: Medicare Other

## 2021-09-05 VITALS — BP 133/74 | HR 73 | Temp 98.1°F | Resp 17

## 2021-09-05 DIAGNOSIS — Z923 Personal history of irradiation: Secondary | ICD-10-CM | POA: Diagnosis not present

## 2021-09-05 DIAGNOSIS — R634 Abnormal weight loss: Secondary | ICD-10-CM | POA: Diagnosis not present

## 2021-09-05 DIAGNOSIS — C182 Malignant neoplasm of ascending colon: Secondary | ICD-10-CM

## 2021-09-05 DIAGNOSIS — D630 Anemia in neoplastic disease: Secondary | ICD-10-CM | POA: Diagnosis not present

## 2021-09-05 DIAGNOSIS — C786 Secondary malignant neoplasm of retroperitoneum and peritoneum: Secondary | ICD-10-CM | POA: Diagnosis not present

## 2021-09-05 DIAGNOSIS — Z5111 Encounter for antineoplastic chemotherapy: Secondary | ICD-10-CM | POA: Diagnosis not present

## 2021-09-05 DIAGNOSIS — K5900 Constipation, unspecified: Secondary | ICD-10-CM | POA: Diagnosis not present

## 2021-09-05 DIAGNOSIS — R112 Nausea with vomiting, unspecified: Secondary | ICD-10-CM | POA: Diagnosis not present

## 2021-09-05 MED ORDER — SODIUM CHLORIDE 0.9% FLUSH
10.0000 mL | INTRAVENOUS | Status: DC | PRN
  Administered 2021-09-05: 10 mL

## 2021-09-05 MED ORDER — HEPARIN SOD (PORK) LOCK FLUSH 100 UNIT/ML IV SOLN
500.0000 [IU] | Freq: Once | INTRAVENOUS | Status: AC | PRN
  Administered 2021-09-05: 500 [IU]

## 2021-09-08 DIAGNOSIS — M85851 Other specified disorders of bone density and structure, right thigh: Secondary | ICD-10-CM | POA: Diagnosis not present

## 2021-09-08 DIAGNOSIS — M85852 Other specified disorders of bone density and structure, left thigh: Secondary | ICD-10-CM | POA: Diagnosis not present

## 2021-09-13 DIAGNOSIS — C182 Malignant neoplasm of ascending colon: Secondary | ICD-10-CM | POA: Diagnosis not present

## 2021-09-16 MED FILL — Dexamethasone Sodium Phosphate Inj 100 MG/10ML: INTRAMUSCULAR | Qty: 1 | Status: AC

## 2021-09-17 ENCOUNTER — Encounter: Payer: Self-pay | Admitting: Hematology

## 2021-09-17 ENCOUNTER — Inpatient Hospital Stay (HOSPITAL_BASED_OUTPATIENT_CLINIC_OR_DEPARTMENT_OTHER): Payer: Medicare Other | Admitting: Hematology

## 2021-09-17 ENCOUNTER — Other Ambulatory Visit: Payer: Self-pay

## 2021-09-17 ENCOUNTER — Inpatient Hospital Stay: Payer: Medicare Other | Attending: Hematology

## 2021-09-17 ENCOUNTER — Inpatient Hospital Stay: Payer: Medicare Other

## 2021-09-17 VITALS — BP 133/74 | HR 72 | Temp 98.1°F | Resp 18 | Ht 66.0 in | Wt 132.1 lb

## 2021-09-17 DIAGNOSIS — C182 Malignant neoplasm of ascending colon: Secondary | ICD-10-CM

## 2021-09-17 DIAGNOSIS — R112 Nausea with vomiting, unspecified: Secondary | ICD-10-CM | POA: Diagnosis not present

## 2021-09-17 DIAGNOSIS — K5900 Constipation, unspecified: Secondary | ICD-10-CM | POA: Insufficient documentation

## 2021-09-17 DIAGNOSIS — C786 Secondary malignant neoplasm of retroperitoneum and peritoneum: Secondary | ICD-10-CM | POA: Diagnosis not present

## 2021-09-17 DIAGNOSIS — D630 Anemia in neoplastic disease: Secondary | ICD-10-CM | POA: Diagnosis not present

## 2021-09-17 DIAGNOSIS — Z923 Personal history of irradiation: Secondary | ICD-10-CM | POA: Diagnosis not present

## 2021-09-17 DIAGNOSIS — Z452 Encounter for adjustment and management of vascular access device: Secondary | ICD-10-CM | POA: Diagnosis not present

## 2021-09-17 DIAGNOSIS — Z95828 Presence of other vascular implants and grafts: Secondary | ICD-10-CM

## 2021-09-17 DIAGNOSIS — D500 Iron deficiency anemia secondary to blood loss (chronic): Secondary | ICD-10-CM

## 2021-09-17 DIAGNOSIS — Z85038 Personal history of other malignant neoplasm of large intestine: Secondary | ICD-10-CM | POA: Diagnosis not present

## 2021-09-17 DIAGNOSIS — Z5111 Encounter for antineoplastic chemotherapy: Secondary | ICD-10-CM | POA: Diagnosis not present

## 2021-09-17 DIAGNOSIS — Z933 Colostomy status: Secondary | ICD-10-CM | POA: Diagnosis not present

## 2021-09-17 DIAGNOSIS — R634 Abnormal weight loss: Secondary | ICD-10-CM | POA: Diagnosis not present

## 2021-09-17 LAB — CMP (CANCER CENTER ONLY)
ALT: 23 U/L (ref 0–44)
AST: 21 U/L (ref 15–41)
Albumin: 3.3 g/dL — ABNORMAL LOW (ref 3.5–5.0)
Alkaline Phosphatase: 105 U/L (ref 38–126)
Anion gap: 10 (ref 5–15)
BUN: 9 mg/dL (ref 8–23)
CO2: 28 mmol/L (ref 22–32)
Calcium: 9.6 mg/dL (ref 8.9–10.3)
Chloride: 103 mmol/L (ref 98–111)
Creatinine: 1.18 mg/dL (ref 0.61–1.24)
GFR, Estimated: >60 mL/min (ref >60)
Glucose, Bld: 88 mg/dL (ref 70–99)
Potassium: 3.9 mmol/L (ref 3.5–5.1)
Sodium: 141 mmol/L (ref 135–145)
Total Bilirubin: 0.4 mg/dL (ref 0.3–1.2)
Total Protein: 7 g/dL (ref 6.5–8.1)

## 2021-09-17 LAB — CBC WITH DIFFERENTIAL (CANCER CENTER ONLY)
Abs Immature Granulocytes: 0.02 10*3/uL (ref 0.00–0.07)
Basophils Absolute: 0 10*3/uL (ref 0.0–0.1)
Basophils Relative: 0 %
Eosinophils Absolute: 0.1 10*3/uL (ref 0.0–0.5)
Eosinophils Relative: 2 %
HCT: 33.2 % — ABNORMAL LOW (ref 39.0–52.0)
Hemoglobin: 10.9 g/dL — ABNORMAL LOW (ref 13.0–17.0)
Immature Granulocytes: 0 %
Lymphocytes Relative: 32 %
Lymphs Abs: 2.7 10*3/uL (ref 0.7–4.0)
MCH: 27.8 pg (ref 26.0–34.0)
MCHC: 32.8 g/dL (ref 30.0–36.0)
MCV: 84.7 fL (ref 80.0–100.0)
Monocytes Absolute: 1.3 10*3/uL — ABNORMAL HIGH (ref 0.1–1.0)
Monocytes Relative: 15 %
Neutro Abs: 4.5 10*3/uL (ref 1.7–7.7)
Neutrophils Relative %: 51 %
Platelet Count: 285 10*3/uL (ref 150–400)
RBC: 3.92 MIL/uL — ABNORMAL LOW (ref 4.22–5.81)
RDW: 16.5 % — ABNORMAL HIGH (ref 11.5–15.5)
WBC Count: 8.6 10*3/uL (ref 4.0–10.5)
nRBC: 0 % (ref 0.0–0.2)

## 2021-09-17 LAB — FERRITIN: Ferritin: 132 ng/mL (ref 24–336)

## 2021-09-17 LAB — CEA (IN HOUSE-CHCC): CEA (CHCC-In House): 16.92 ng/mL — ABNORMAL HIGH (ref 0.00–5.00)

## 2021-09-17 MED ORDER — SODIUM CHLORIDE 0.9 % IV SOLN
110.0000 mg/m2 | Freq: Once | INTRAVENOUS | Status: AC
  Administered 2021-09-17: 180 mg via INTRAVENOUS
  Filled 2021-09-17: qty 9

## 2021-09-17 MED ORDER — SODIUM CHLORIDE 0.9 % IV SOLN
2000.0000 mg/m2 | INTRAVENOUS | Status: DC
  Administered 2021-09-17: 3250 mg via INTRAVENOUS
  Filled 2021-09-17: qty 65

## 2021-09-17 MED ORDER — SODIUM CHLORIDE 0.9 % IV SOLN
400.0000 mg/m2 | Freq: Once | INTRAVENOUS | Status: AC
  Administered 2021-09-17: 652 mg via INTRAVENOUS
  Filled 2021-09-17: qty 32.6

## 2021-09-17 MED ORDER — ATROPINE SULFATE 1 MG/ML IJ SOLN
0.5000 mg | Freq: Once | INTRAMUSCULAR | Status: AC | PRN
  Administered 2021-09-17: 0.5 mg via INTRAVENOUS
  Filled 2021-09-17: qty 1

## 2021-09-17 MED ORDER — PALONOSETRON HCL INJECTION 0.25 MG/5ML
0.2500 mg | Freq: Once | INTRAVENOUS | Status: AC
  Administered 2021-09-17: 0.25 mg via INTRAVENOUS
  Filled 2021-09-17: qty 5

## 2021-09-17 MED ORDER — SODIUM CHLORIDE 0.9% FLUSH
10.0000 mL | INTRAVENOUS | Status: DC | PRN
Start: 2021-09-17 — End: 2021-09-17
  Administered 2021-09-17: 10 mL

## 2021-09-17 MED ORDER — PROCHLORPERAZINE MALEATE 10 MG PO TABS
10.0000 mg | ORAL_TABLET | Freq: Once | ORAL | Status: AC
  Administered 2021-09-17: 10 mg via ORAL
  Filled 2021-09-17: qty 1

## 2021-09-17 MED ORDER — SODIUM CHLORIDE 0.9 % IV SOLN
10.0000 mg | Freq: Once | INTRAVENOUS | Status: AC
  Administered 2021-09-17: 10 mg via INTRAVENOUS
  Filled 2021-09-17: qty 10

## 2021-09-17 MED ORDER — SODIUM CHLORIDE 0.9 % IV SOLN: Freq: Once | INTRAVENOUS | Status: AC

## 2021-09-17 MED ORDER — OXYCODONE HCL 5 MG PO TABS: 5.0000 mg | ORAL_TABLET | Freq: Three times a day (TID) | ORAL | 0 refills | Status: AC | PRN

## 2021-09-17 NOTE — Patient Instructions (Signed)
[facility] [facility] ONCOLOGY   Discharge Instructions: Thank you for choosing [facility] to provide your oncology and hematology care.   If you have a lab appointment with the [facility], please go directly to the [facility] and check in at the registration area.   Wear comfortable clothing and clothing appropriate for easy access to any Portacath or PICC line.   We strive to give you quality time with your provider. You may need to reschedule your appointment if you arrive late (15 or more minutes).  Arriving late affects you and other patients whose appointments are after yours.  Also, if you miss three or more appointments without notifying the office, you may be dismissed from the clinic at the provider's discretion.      For prescription refill requests, have your pharmacy contact our office and allow 72 hours for refills to be completed.    Today you received the following chemotherapy and/or immunotherapy agents: irinotecan, leucovorin, and fluororuacil.      To help prevent nausea and vomiting after your treatment, we encourage you to take your nausea medication as directed.  BELOW ARE SYMPTOMS THAT SHOULD BE REPORTED IMMEDIATELY: *FEVER GREATER THAN 100.4 F (38 C) OR HIGHER *CHILLS OR SWEATING *NAUSEA AND VOMITING THAT IS NOT CONTROLLED WITH YOUR NAUSEA MEDICATION *UNUSUAL SHORTNESS OF BREATH *UNUSUAL BRUISING OR BLEEDING *URINARY PROBLEMS (pain or burning when urinating, or frequent urination) *BOWEL PROBLEMS (unusual diarrhea, constipation, pain near the anus) TENDERNESS IN MOUTH AND THROAT WITH OR WITHOUT PRESENCE OF ULCERS (sore throat, sores in mouth, or a toothache) UNUSUAL RASH, SWELLING OR PAIN  UNUSUAL VAGINAL DISCHARGE OR ITCHING   Items with * indicate a potential emergency and should be followed up as soon as possible or go to the Emergency Department if any problems should occur.  Please show the CHEMOTHERAPY ALERT CARD or  IMMUNOTHERAPY ALERT CARD at check-in to the Emergency Department and triage nurse.  Should you have questions after your visit or need to cancel or reschedule your appointment, please contact [facility]  Dept: [phone]  and follow the prompts.  Office hours are 8:00 a.m. to 4:30 p.m. Monday - Friday. Please note that voicemails left after 4:00 p.m. may not be returned until the following business day.  We are closed weekends and major holidays. You have access to a nurse at all times for urgent questions. Please call the main number to the clinic Dept: [phone] and follow the prompts.   For any non-urgent questions, you may also contact your provider using MyChart. We now offer e-Visits for anyone 50 and older to request care online for non-urgent symptoms. For details visit [email or web address].   Also download the MyChart app! Go to the app store, search "MyChart", open the app, select [facility], and log in with your MyChart username and password.  Due to Covid, a mask is required upon entering the hospital/clinic. If you do not have a mask, one will be given to you upon arrival. For doctor visits, patients may have 1 support person aged 5 or older with them. For treatment visits, patients cannot have anyone with them due to current Covid guidelines and our immunocompromised population.

## 2021-09-17 NOTE — Progress Notes (Signed)
[name]   [phone] [phone]   Clinic Follow up Note   Patient Care Team: Lajean Manes, MD as PCP - General (Internal Medicine) Berle Mull, MD as Consulting Physician (Family Medicine) Clarene Essex, MD as Consulting Physician (Gastroenterology) Truitt Merle, MD as Consulting Physician (Oncology)  Date of Service:  09/17/2021  CHIEF COMPLAINT: f/u of metastatic colon cancer  CURRENT THERAPY:  Second line chemo FOLFIRI and bevacizumab q2weeks starting 09/18/20. Chemo on hold since 12/23/20 due to fistula. RESTART with low dose on 04/03/21.  ASSESSMENT & PLAN:  Nathaniel Norton is a 70 y.o. male with   1. Cancer of right colon, with peritoneal and lung metastasis, stage IV,  MMR normal, KRAS mutation (+), MSS -He was diagnosed in 03/2019. His Colonoscopy biopsy showed adenocarcinoma of right hepatic flexure colon. His peritoneal biopsy from 04/27/19 shows adenocarcinoma, consistent with metastasis of his colon cancer. This is now stage IV disease. -He was treated with first-line FOLFOX in June-November 2020 before proceeding with HIPEC surgery by Dr. Clovis Riley on 12/17/19.  -09/10/20 PET showed disease progression with hypermetabolic pleural (right) and peritoneal nodules. A biopsy was not felt to be necessary given his previously known peritoneal metastasis -He started second line FOLFIRI and bevacizumab q2weeks on 09/18/20, tolerated first 2 cycles poorly with abdominal pain/cramping and diarrhea.  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 Clovis Riley on 02/24/21.   -Resumed low-dose single agent irinotecan on 04/03/2021, added low-dose 5-FU back on 05/14/21.  He has overall tolerated  well. -Restaging CT AP 07/07/21 showed no evidence of recurrent or metastatic disease in abdomen and pelvis, stable plural mets and small lung nodules, no other new lesions  -He is clinically stable, but his CEA has been slowly increasing. It recently jumped from 5.06 on 07/23/21 to 9.44 on 08/20/21.  -we increased his irinotecan dose by 10% with his last cycle (on 09/03/21), which he tolerated overall just with some slightly worsened symptoms and longer recovery time. We will try the same dose today to see how he does. I do not think he can tolerate any further dose increases. -labs from today are adequate for treatment, will proceed FOLFIRI today and continue every 2 weeks -restaging CT in 4 weeks   2. Symptom management: Nausea, vomiting, weight loss, constipation -weight is stable lately -continue f/u with dietician  -He has had worsening constipation after chemo lately which causes nausea and vomiting. I will add compazine to his premeds today. -He is using oxycodone as needed for abdominal cramping. -He tried dulcolax and colace, but he took it only as needed. I again recommended he take colace or dulcolax more regularly, once daily for 3-5 days after chemo.   3. 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 Watt Climes showed internal hemorrhoids, benign polyps, diverticulosis and colon cancer. -He was treated with oral iron, IV Feraheme on 05/03/19 and 05/16/19 (had reaction with 2nd dose).  -With cancer recurrence his anemia recurred. Hgb went down when he restarted chemotherapy in 03/2021, recovering since 05/2021. Hgb stable in 11 range, stable at 10.9 today (09/17/21)   4. Goal of care discussion -The patient understands the goal of care is palliative. -I previously  recommended DNR, he will think about it      PLAN:  -I refilled his oxycodone today -proceed with same dose FOLFIRI today -Lab, flush, f/u and FOLFIRI in 2 weeks -flush,  f/u, and FOLFIRI in 4 weeks with lab and restaging CT several days before.   No problem-specific Assessment & Plan notes found for this encounter.   SUMMARY OF ONCOLOGIC HISTORY: Oncology History Overview Note  Cancer Staging Cancer of right colon [facility]) Staging form: Colon and Rectum, AJCC 8th Edition - Clinical stage from 04/09/2019: Stage IVC (cTX, cNX, pM1c) - Signed by Truitt Merle, MD on 05/03/2019     Cancer of right colon [facility])  04/09/2019 Procedure   Colonoscopy 04/09/19 by Dr Watt Climes IMPRESSION -internal hemorrhoids -Diverticulosis in the sigmoid colon  -2 small polyps in the rectum and in the proximal transverse colon, removed with a hot snare. Resected and retrieved.  -3 medium polyps in the proximal transverse colon, in the mid transverse colon and in the distal transverse colon, removed and resected and retrieved.  -likely malignant partially obstructing tumor at the hepatic flexure. biopsied, tattooed.  -1 large polyp in the mid ascending colon  -the examination was otherwise normal     04/09/2019 Initial Biopsy   FINAL MICROSCOPIC DIAGNOSIS: 04/09/19 1. LG intestine-hepatic flexure, Biopsy:   INVASIVE WELL DIFFERENTIATED ADENOCARCINOMA   04/09/2019 Cancer Staging   Staging form: Colon and Rectum, AJCC 8th Edition - Clinical stage from 04/09/2019: Stage IVC (cTX, cNX, pM1c) - Signed by Truitt Merle, MD on 05/03/2019   04/10/2019 Imaging   CT AP 04/10/19  IMPRESSION: 1. There is an eccentric mass of the colon involving the ascending colon near the hepatic flexure measuring approximately 3.4 x 3.4 by 2.0 cm (series 2, image 37, series 3, image 37). There is extensive soft tissue nodularity of the mesocolon and omentum, and likely areas of the peritoneum, for example bilateral upper quadrants (series 2, image 25). Findings are consistent with primary colon malignancy, probable omental and peritoneal involvement, and small volume malignant ascites. 2.  Other chronic and  incidental findings as detailed above.   04/20/2019 Initial Diagnosis   Cancer of right colon ([place])   04/26/2019 Imaging   CT Chest 04/26/19 IMPRESSION: 1. Borderline to mild lower thoracic adenopathy, including within the right internal mammary and juxta cardiophrenic stations. Given the appearance of the upper abdomen, suspicious for nodal metastasis. 2. A low right paratracheal node is borderline sized, but favored to be reactive. 3. No evidence of pulmonary metastasis. 4. Peritoneal metastasis and abdominal ascites, as before. 5. Pancreatic parenchymal calcifications indicative of chronic calcific pancreatitis. 6. Coronary artery atherosclerosis.   04/27/2019 Pathology Results   Diagnosis 04/27/19 Peritoneum, biopsy, right upper quadrant, perihepatic - ADENOCARCINOMA, CONSISTENT WITH COLONIC PRIMARY. - SEE COMMENT.   05/16/2019 - 10/31/2019 Chemotherapy   First line FOLFOX every 2 weeks with Avastin starting with cycle 2 starting 05/16/19. 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 Stopped after 10/31/19 for surgery.    07/23/2019 Imaging    CT AP W Contrast  IMPRESSION: 1. Primary ascending colon mass may be minimally smaller. Peritoneal metastatic disease appears slightly improved as well. 2. Chronic calcific pancreatitis. 3. Tiny left renal stone. 4. Small left inguinal hernia contains a knuckle of unobstructed colon. 5. Enlarged prostate.   10/29/2019 Imaging   CT AP W Contrast IMPRESSION: No significant change in  small ascending colon soft tissue mass and diffuse omental carcinomatosis.   No new or progressive metastatic disease identified.   Stable small left inguinal hernia containing a loop of sigmoid colon. No evidence of bowel obstruction or strangulation.   Colonic diverticulosis. No radiographic  evidence of diverticulitis.   Stable enlarged prostate.   12/17/2019 Pathology Results   HIPEC Surgery by Dr Clovis Riley 12/17/19 FINAL PATHOLOGIC DIAGNOSIS  MICROSCOPIC EXAMINATION AND DIAGNOSIS  A.          "RIGHT COLON, OMENTUM, SPLEEN":       Invasive adenocarcinoma with mucinous features, moderately differentiated.  Tumor involves mesentery and spleen.  Five out of ten lymph nodes involved by adenocarcinoma with perinodal soft tissue involvement (5/10), see comment.  Margins are uninvolved by invasive carcinoma.  Ileum and appendix, uninvolved.  See comment and cancer case summary.  B.          "ROUND LIGAMENT OF LIVER":       Involved by adenocarcinoma with mucinous features, moderately differentiated.  C.          "RIGHT DIAPHRAM STRIPPING":       Negative for carcinoma.  D.          "RIGHT HEPATIC CAPSULECTOMY":       Chronic inflammation and fibrosis.  Negative for carcinoma.  E.          "DEBRIDED TUMOR":       Involved by adenocarcinoma with mucinous features, moderately differentiated.   COMMENT: Sections disclose five out of ten lymph nodes involved by adenocarcinoma with perinodal soft tissue involvement. However, it is not possible to be certain if this represents lymph nodes replaced by tumor and/or soft tissue involvement. In addition, focal perineural invasion is seen.   04/03/2020 Imaging   CT AP W contrast  IMPRESSION: 1. Status post interval splenectomy and right hemicolectomy. 2. Response to therapy of omental/peritoneal metastasis. The only residual indeterminate finding is fluid density along the capsule of the hepatic dome which could be postoperative or secondary to localized residual peritoneal disease. 3. Left nephrolithiasis. 4.  Possible constipation. 5. Prostatomegaly. 6.  Aortic Atherosclerosis (ICD10-I70.0).   07/11/2020 Imaging   CT AP  1.  Postsurgical changes of subtle reductive surgery including right hemicolectomy, omentectomy,  splenectomy, and right hepatic capsulectomy.  2.  Interval development of multiple areas of low attenuation involving the liver, as described above. This the intraparenchymal low-density lesion is concerning for metastatic disease. Other disease along the capsule may be post therapy related. Residual thickening of the anterior peritoneum particularly anterior to the right lobe of the liver is also concerning for some residual disease although appears significantly improved. Recommend attention on follow-up.  3.  No definite evidence of residual peritoneal disease status post HIPEC.   09/18/2020 -  Chemotherapy   Second line chemo FOLFIRI and bevacizumab q2weeks starting 09/18/20. Chemo on hold since 12/23/20 due to fistula. RESTART with low dose irinotecan alone on 04/03/21.   12/19/2020 Imaging   CT CAP  IMPRESSION: 1. Interval development of a 1.6 x 2.7 x 1.6 cm collection of gas and debris between the sigmoid colon and dome of bladder, compatible with small abscess. A portion of this abscess is probably intramural at the dome of bladder. Marked associated thickening of the adjacent colonic and bladder walls with tiny gas bubbles in the thickened bladder wall and prominent amount of free gas in the bladder lumen suggests an associated colovesical fistula. Tiny gas bubbles are also seen in the central  prostate gland and may be in the urethra although parenchymal gas within the prostate gland is not excluded on this exam. 2. Nodularity along the major and minor fissures of the right hemithorax has decreased in the interval, nearly imperceptible today. 3. Peritoneal implants along the liver, right abdomen, and deep to the left paramidline anterior abdominal wall have resolved in the interval by CT imaging. 4. Tiny right lower lobe pulmonary nodule is more conspicuous today. Attention on follow-up recommended. 5. Stable 4 mm hypodensity in the dome of the right liver, too small to  characterize. 6. Nonobstructing left renal stones with bilateral renal cysts. 7. Stable compression deformity at multiple thoracic and lumbar levels. 8. Aortic Atherosclerosis (ICD10-I70.0).   02/24/2021 Surgery   LAPAROTOMY EXPLORATORY resection of sigmoid colon and proximal rectum, takedown of colovesical fistula, creation of end ileosotmy by Dr Clovis Riley    Final Pathologic Diagnosis      A.  SIGMOID COLON AND RECTUM, RESECTION:               Invasive adenocarcinoma with mucinous features, moderately differentiated.               Tumor measures 3.2 cm in greatest dimension. Tumor invades visceral peritoneum.  Lymphovascular invasion is present.               Metastatic adenocarcinoma involving seven (of 22) lymph nodes (7/22).  One margin involved by adenocarcinoma (grossly presumed distal margin).     03/25/2021 Imaging   IMPRESSION: 1. New bilateral pulmonary nodules worrisome for new pulmonary metastatic disease. 2. New small right pleural effusion with overlying atelectasis. 3. Recurrent pleural nodularity on the right. 4. No findings for abdominal/pelvic metastatic disease. 5. Stable severe atrophy of the pancreatic body and tail with associated main pancreatic duct dilatation. No obvious obstructing pancreatic lesion but this was not present on the prior CT scan from 04/03/2020. It may be due to a stricture or a small ductal lesion, not well seen. MRI abdomen without and with contrast may be helpful for further evaluation of this finding. 6. Stable surgical changes involving the colon with a Hartmann's pouch and left lower quadrant colostomy. 7. Stable enlarged prostate gland. 8. Stable thoracic and lumbar compression fractures.   07/07/2021 Imaging   CT AP  IMPRESSION: 1. Redemonstrated postoperative findings of right hemicolectomy and ileocolic anastomosis as well as Hartmann procedure sigmoid colon resection and left lower quadrant end colostomy. 2. No evidence of  recurrent or metastatic disease in the abdomen or pelvis. 3. Multiple small pulmonary nodules are not significantly changed compared to prior examination. There is unchanged thickening and nodularity along the pleural surfaces of the right lung. Findings remain consistent with pulmonary parenchymal and pleural metastatic disease. 4. Small right pleural effusion and associated atelectasis or consolidation, slightly increased compared to prior examination. 5. Status post splenectomy and cholecystectomy. 6. Prostatomegaly. 7. Coronary artery disease.   Aortic Atherosclerosis (ICD10-I70.0).      INTERVAL HISTORY:  Nathaniel Norton is here for a follow up of metastatic colon cancer. He was last seen by me on 09/03/21. He presents to the clinic alone. He reports the chemo dose increase with his last cycle did not go "as he expected." He reports he didn't feel well the day of treatment and the subsequent 4 days after. He reports "it wasn't just one thing, everything was just a bit more." He notes he didn't seem to recover as well or as quickly. He reports more stomach growling and passing gas  and notes the oxycodone slowed down his stomach movement enough that he could sleep. He reports the last few days he has had difficulty sleeping, specifically noting he did not sleep at all last night. He reports the colace and dulcolax seemed to do nothing for him. He reports he continues to have constipation right after chemo and denies watery stool. He reports he had a bone density screening last week, and this was normal, per his report.   All other systems were reviewed with the patient and are negative.  MEDICAL HISTORY:  Past Medical History:  Diagnosis Date   colon ca dx'd 03/2019    SURGICAL HISTORY: Past Surgical History:  Procedure Laterality Date   CATARACT EXTRACTION     IR IMAGING GUIDED PORT INSERTION  05/10/2019   L4 fracture  2012   RETINAL DETACHMENT SURGERY     Right hand surgery   1974    I have reviewed the social history and family history with the patient and they are unchanged from previous note.  ALLERGIES:  is allergic to feraheme [ferumoxytol] and codeine.  MEDICATIONS:  Current Outpatient Medications  Medication Sig Dispense Refill   diphenoxylate-atropine (LOMOTIL) 2.5-0.025 MG tablet Take 1-2 tablets by mouth 4 (four) times daily as needed for diarrhea or loose stools. 90 tablet 1   fluticasone (FLONASE) 50 MCG/ACT nasal spray Place into the nose.     lidocaine-prilocaine (EMLA) cream Apply 1 application topically as needed. 30 g 1   Multiple Vitamin (MULTIVITAMIN) tablet Take 1 tablet by mouth daily.     MULTIPLE VITAMIN PO      ondansetron (ZOFRAN) 8 MG tablet Take 1 tablet (8 mg total) by mouth every 8 (eight) hours as needed for nausea or vomiting. Start on day 3 after chemotherapy 20 tablet 0   ondansetron (ZOFRAN-ODT) 4 MG disintegrating tablet Take 4 mg by mouth every 8 (eight) hours as needed.     oxyCODONE (OXY IR/ROXICODONE) 5 MG immediate release tablet Take 1 tablet (5 mg total) by mouth every 8 (eight) hours as needed for severe pain. 60 tablet 0   prochlorperazine (COMPAZINE) 10 MG tablet Take 1 tablet (10 mg total) by mouth every 6 (six) hours as needed (Nausea or vomiting). 30 tablet 2   sodium chloride flush 0.9 % SOLN injection      No current facility-administered medications for this visit.   Facility-Administered Medications Ordered in Other Visits  Medication Dose Route Frequency Provider Last Rate Last Admin   fluorouracil (ADRUCIL) 3,250 mg in sodium chloride 0.9 % 85 mL chemo infusion  2,000 mg/m2 (Treatment Plan Recorded) Intravenous 1 day or 1 dose Truitt Merle, MD   3,250 mg at 09/17/21 1227   sodium chloride flush (NS) 0.9 % injection 10 mL  10 mL Intracatheter PRN Truitt Merle, MD   10 mL at 05/30/21 1145    PHYSICAL EXAMINATION: ECOG PERFORMANCE STATUS: 1 - Symptomatic but completely ambulatory  Vitals:   09/17/21 0923  BP:  133/74  Pulse: 72  Resp: 18  Temp: 98.1 F (36.7 C)  SpO2: 97%   Wt Readings from Last 3 Encounters:  09/17/21 132 lb 1.6 oz (59.9 kg)  09/03/21 130 lb 12.8 oz (59.3 kg)  08/20/21 129 lb 3.2 oz (58.6 kg)     GENERAL:alert, no distress and comfortable SKIN: skin color normal, no rashes or significant lesions EYES: normal, Conjunctiva are pink and non-injected, sclera clear  NEURO: alert & oriented x 3 with fluent speech  LABORATORY DATA:  I have reviewed the data as listed CBC Latest Ref Rng & Units 09/17/2021 09/03/2021 08/20/2021  WBC 4.0 - 10.5 K/uL 8.6 6.5 7.2  Hemoglobin 13.0 - 17.0 g/dL 10.9(L) 10.6(L) 11.2(L)  Hematocrit 39.0 - 52.0 % 33.2(L) 31.2(L) 33.5(L)  Platelets 150 - 400 K/uL 285 287 308     CMP Latest Ref Rng & Units 09/17/2021 09/03/2021 08/20/2021  Glucose 70 - 99 mg/dL 88 96 91  BUN 8 - 23 mg/dL _0 Creatinine 0.61 - 1.24 mg/dL 1.18 0.86 1.16  Sodium 135 - 145 mmol/L 141 142 141  Potassium 3.5 - 5.1 mmol/L 3.9 4.0 4.1  Chloride 98 - 111 mmol/L 103 105 105  CO2 22 - 32 mmol/L _1 Calcium 8.9 - 10.3 mg/dL 9.6 9.5 9.6  Total Protein 6.5 - 8.1 g/dL 7.0 7.0 7.1  Total Bilirubin 0.3 - 1.2 mg/dL 0.4 0.4 0.4  Alkaline Phos 38 - 126 U/L 105 99 104  AST 15 - 41 U/L _2 ALT 0 - 44 U/L 23 35 26      RADIOGRAPHIC STUDIES: I have personally reviewed the radiological images as listed and agreed with the findings in the report. No results found.    Orders Placed This Encounter  Procedures   CT CHEST ABDOMEN PELVIS W CONTRAST    Standing Status:   Future    Standing Expiration Date:   09/17/2022    Order Specific Question:   Preferred imaging location?    Answer:   [facility]    Order Specific Question:   Is Oral Contrast requested for this exam?    Answer:   Yes, Per Radiology protocol   All questions were answered. The patient knows to call the clinic with any problems, questions or concerns. No barriers to learning was detected. The  total time spent in the appointment was 30 minutes.     Truitt Merle, MD 09/17/2021   I, Wilburn Mylar, am acting as scribe for Truitt Merle, MD.   I have reviewed the above documentation for accuracy and completeness, and I agree with the above.

## 2021-09-18 ENCOUNTER — Encounter: Payer: Self-pay | Admitting: Hematology

## 2021-09-18 ENCOUNTER — Telehealth: Payer: Self-pay | Admitting: Hematology

## 2021-09-18 NOTE — Telephone Encounter (Signed)
Scheduled follow-up appointments per 10/6 los. Patient is aware. 

## 2021-09-19 ENCOUNTER — Other Ambulatory Visit: Payer: Self-pay

## 2021-09-19 ENCOUNTER — Inpatient Hospital Stay: Payer: Medicare Other

## 2021-09-19 VITALS — BP 136/89 | HR 85 | Temp 97.8°F | Resp 18

## 2021-09-19 DIAGNOSIS — K5900 Constipation, unspecified: Secondary | ICD-10-CM | POA: Diagnosis not present

## 2021-09-19 DIAGNOSIS — C182 Malignant neoplasm of ascending colon: Secondary | ICD-10-CM | POA: Diagnosis not present

## 2021-09-19 DIAGNOSIS — D630 Anemia in neoplastic disease: Secondary | ICD-10-CM | POA: Diagnosis not present

## 2021-09-19 DIAGNOSIS — C786 Secondary malignant neoplasm of retroperitoneum and peritoneum: Secondary | ICD-10-CM | POA: Diagnosis not present

## 2021-09-19 DIAGNOSIS — Z5111 Encounter for antineoplastic chemotherapy: Secondary | ICD-10-CM | POA: Diagnosis not present

## 2021-09-19 DIAGNOSIS — Z452 Encounter for adjustment and management of vascular access device: Secondary | ICD-10-CM | POA: Diagnosis not present

## 2021-09-19 DIAGNOSIS — Z923 Personal history of irradiation: Secondary | ICD-10-CM | POA: Diagnosis not present

## 2021-09-19 DIAGNOSIS — R634 Abnormal weight loss: Secondary | ICD-10-CM | POA: Diagnosis not present

## 2021-09-19 DIAGNOSIS — R112 Nausea with vomiting, unspecified: Secondary | ICD-10-CM | POA: Diagnosis not present

## 2021-09-19 MED ORDER — SODIUM CHLORIDE 0.9% FLUSH
10.0000 mL | INTRAVENOUS | Status: DC | PRN
  Administered 2021-09-19: 10 mL

## 2021-09-19 MED ORDER — HEPARIN SOD (PORK) LOCK FLUSH 100 UNIT/ML IV SOLN
500.0000 [IU] | Freq: Once | INTRAVENOUS | Status: AC | PRN
  Administered 2021-09-19: 500 [IU]

## 2021-09-30 MED FILL — Dexamethasone Sodium Phosphate Inj 100 MG/10ML: INTRAMUSCULAR | Qty: 1 | Status: AC

## 2021-10-01 ENCOUNTER — Inpatient Hospital Stay: Payer: Medicare Other

## 2021-10-01 ENCOUNTER — Inpatient Hospital Stay (HOSPITAL_BASED_OUTPATIENT_CLINIC_OR_DEPARTMENT_OTHER): Payer: Medicare Other | Admitting: Hematology

## 2021-10-01 ENCOUNTER — Other Ambulatory Visit: Payer: Self-pay

## 2021-10-01 ENCOUNTER — Encounter: Payer: Self-pay | Admitting: Hematology

## 2021-10-01 VITALS — BP 117/77 | HR 78 | Temp 97.9°F | Resp 17 | Ht 66.0 in | Wt 130.0 lb

## 2021-10-01 DIAGNOSIS — D630 Anemia in neoplastic disease: Secondary | ICD-10-CM | POA: Diagnosis not present

## 2021-10-01 DIAGNOSIS — R634 Abnormal weight loss: Secondary | ICD-10-CM | POA: Diagnosis not present

## 2021-10-01 DIAGNOSIS — C786 Secondary malignant neoplasm of retroperitoneum and peritoneum: Secondary | ICD-10-CM | POA: Diagnosis not present

## 2021-10-01 DIAGNOSIS — C182 Malignant neoplasm of ascending colon: Secondary | ICD-10-CM

## 2021-10-01 DIAGNOSIS — R112 Nausea with vomiting, unspecified: Secondary | ICD-10-CM | POA: Diagnosis not present

## 2021-10-01 DIAGNOSIS — K5900 Constipation, unspecified: Secondary | ICD-10-CM | POA: Diagnosis not present

## 2021-10-01 DIAGNOSIS — Z452 Encounter for adjustment and management of vascular access device: Secondary | ICD-10-CM | POA: Diagnosis not present

## 2021-10-01 DIAGNOSIS — Z95828 Presence of other vascular implants and grafts: Secondary | ICD-10-CM

## 2021-10-01 DIAGNOSIS — Z5111 Encounter for antineoplastic chemotherapy: Secondary | ICD-10-CM | POA: Diagnosis not present

## 2021-10-01 DIAGNOSIS — Z923 Personal history of irradiation: Secondary | ICD-10-CM | POA: Diagnosis not present

## 2021-10-01 DIAGNOSIS — D500 Iron deficiency anemia secondary to blood loss (chronic): Secondary | ICD-10-CM

## 2021-10-01 LAB — CMP (CANCER CENTER ONLY)
ALT: 41 U/L (ref 0–44)
AST: 49 U/L — ABNORMAL HIGH (ref 15–41)
Albumin: 3.4 g/dL — ABNORMAL LOW (ref 3.5–5.0)
Alkaline Phosphatase: 126 U/L (ref 38–126)
Anion gap: 10 (ref 5–15)
BUN: 11 mg/dL (ref 8–23)
CO2: 27 mmol/L (ref 22–32)
Calcium: 9.5 mg/dL (ref 8.9–10.3)
Chloride: 105 mmol/L (ref 98–111)
Creatinine: 1.19 mg/dL (ref 0.61–1.24)
GFR, Estimated: >60 mL/min (ref >60)
Glucose, Bld: 96 mg/dL (ref 70–99)
Potassium: 3.7 mmol/L (ref 3.5–5.1)
Sodium: 142 mmol/L (ref 135–145)
Total Bilirubin: 0.7 mg/dL (ref 0.3–1.2)
Total Protein: 7 g/dL (ref 6.5–8.1)

## 2021-10-01 LAB — CBC WITH DIFFERENTIAL (CANCER CENTER ONLY)
Abs Immature Granulocytes: 0.01 10*3/uL (ref 0.00–0.07)
Basophils Absolute: 0 10*3/uL (ref 0.0–0.1)
Basophils Relative: 0 %
Eosinophils Absolute: 0.1 10*3/uL (ref 0.0–0.5)
Eosinophils Relative: 1 %
HCT: 33.6 % — ABNORMAL LOW (ref 39.0–52.0)
Hemoglobin: 11 g/dL — ABNORMAL LOW (ref 13.0–17.0)
Immature Granulocytes: 0 %
Lymphocytes Relative: 26 %
Lymphs Abs: 1.8 10*3/uL (ref 0.7–4.0)
MCH: 27.7 pg (ref 26.0–34.0)
MCHC: 32.7 g/dL (ref 30.0–36.0)
MCV: 84.6 fL (ref 80.0–100.0)
Monocytes Absolute: 0.9 10*3/uL (ref 0.1–1.0)
Monocytes Relative: 13 %
Neutro Abs: 4.3 10*3/uL (ref 1.7–7.7)
Neutrophils Relative %: 60 %
Platelet Count: 272 10*3/uL (ref 150–400)
RBC: 3.97 MIL/uL — ABNORMAL LOW (ref 4.22–5.81)
RDW: 16.7 % — ABNORMAL HIGH (ref 11.5–15.5)
WBC Count: 7.1 10*3/uL (ref 4.0–10.5)
nRBC: 0 % (ref 0.0–0.2)

## 2021-10-01 MED ORDER — PROCHLORPERAZINE MALEATE 10 MG PO TABS
10.0000 mg | ORAL_TABLET | Freq: Once | ORAL | Status: AC
  Administered 2021-10-01: 10 mg via ORAL
  Filled 2021-10-01: qty 1

## 2021-10-01 MED ORDER — SODIUM CHLORIDE 0.9% FLUSH
10.0000 mL | INTRAVENOUS | Status: DC | PRN
  Administered 2021-10-01: 10 mL

## 2021-10-01 MED ORDER — SODIUM CHLORIDE 0.9 % IV SOLN
2000.0000 mg/m2 | INTRAVENOUS | Status: DC
  Administered 2021-10-01: 3250 mg via INTRAVENOUS
  Filled 2021-10-01: qty 65

## 2021-10-01 MED ORDER — SODIUM CHLORIDE 0.9 % IV SOLN
10.0000 mg | Freq: Once | INTRAVENOUS | Status: AC
  Administered 2021-10-01: 10 mg via INTRAVENOUS
  Filled 2021-10-01: qty 10

## 2021-10-01 MED ORDER — SODIUM CHLORIDE 0.9 % IV SOLN: Freq: Once | INTRAVENOUS | Status: AC

## 2021-10-01 MED ORDER — PALONOSETRON HCL INJECTION 0.25 MG/5ML
0.2500 mg | Freq: Once | INTRAVENOUS | Status: AC
  Administered 2021-10-01: 0.25 mg via INTRAVENOUS
  Filled 2021-10-01: qty 5

## 2021-10-01 MED ORDER — HEPARIN SOD (PORK) LOCK FLUSH 100 UNIT/ML IV SOLN: 500.0000 [IU] | Freq: Once | INTRAVENOUS | Status: DC | PRN

## 2021-10-01 MED ORDER — SODIUM CHLORIDE 0.9 % IV SOLN
100.0000 mg/m2 | Freq: Once | INTRAVENOUS | Status: AC
  Administered 2021-10-01: 160 mg via INTRAVENOUS
  Filled 2021-10-01: qty 8

## 2021-10-01 MED ORDER — ATROPINE SULFATE 1 MG/ML IV SOLN
0.5000 mg | Freq: Once | INTRAVENOUS | Status: AC | PRN
  Administered 2021-10-01: 0.5 mg via INTRAVENOUS
  Filled 2021-10-01: qty 1

## 2021-10-01 MED ORDER — SODIUM CHLORIDE 0.9 % IV SOLN
400.0000 mg/m2 | Freq: Once | INTRAVENOUS | Status: AC
  Administered 2021-10-01: 652 mg via INTRAVENOUS
  Filled 2021-10-01: qty 32.6

## 2021-10-01 NOTE — Progress Notes (Signed)
[place]   Telephone:(336) [phone] Fax:(336) [phone]   Clinic Follow up Note   Patient Care Team: Lajean Manes, MD as PCP - General (Internal Medicine) Berle Mull, MD as Consulting Physician (Family Medicine) Clarene Essex, MD as Consulting Physician (Gastroenterology) Truitt Merle, MD as Consulting Physician (Oncology)  Date of Service:  10/01/2021  CHIEF COMPLAINT: f/u of metastatic colon cancer  CURRENT THERAPY:  Second line chemo FOLFIRI and bevacizumab q2weeks starting 09/18/20. Chemo on hold since 12/23/20 due to fistula. RESTART with low dose on 04/03/21.  ASSESSMENT & PLAN:  Nathaniel Norton is a 70 y.o. male with   1. Cancer of right colon, with peritoneal and lung metastasis, stage IV,  MMR normal, KRAS mutation (+), MSS -He was diagnosed in 03/2019. His Colonoscopy biopsy showed adenocarcinoma of right hepatic flexure colon. His peritoneal biopsy from 04/27/19 shows adenocarcinoma, consistent with metastasis of his colon cancer. This is now stage IV disease. -He was treated with first-line FOLFOX in June-November 2020 before proceeding with HIPEC surgery by Dr. Clovis Riley on 12/17/19.  -09/10/20 PET showed disease progression with hypermetabolic pleural (right) and peritoneal nodules. A biopsy was not felt to be necessary given his previously known peritoneal metastasis -He started second line FOLFIRI and bevacizumab q2weeks on 09/18/20, tolerated first 2 cycles poorly with abdominal pain/cramping and diarrhea.  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 Clovis Riley on 02/24/21.   -Resumed low-dose single agent irinotecan on 04/03/2021, added low-dose 5-FU back on 05/14/21.  He has overall tolerated  well. -Restaging CT AP 07/07/21 showed no evidence of recurrent or metastatic disease in abdomen and pelvis, stable plural mets and small lung nodules, no other new lesions  -we increased his irinotecan dose by 10% with his last cycle (on 09/03/21), which he tolerated overall just with some slightly worsened symptoms and longer recovery time. He tried this dose for 2 cycles but did not tolerate well. We will return to his prior dose today. -He is clinically stable, but his CEA has been slowly increasing. It recently jumped from 5.06 on 07/23/21 to 9.44 on 08/20/21 and to 16.92 on 09/17/21. I discussed the results with him. We will make a final decision regarding changing his treatment after he has restaging scan in 2 weeks. We will schedule to be done soon. -labs from today are adequate for treatment, will proceed FOLFIRI today at reduced dose back to $Remov'100mg'Kpyxuo$ /m2 due to his poor tolerance. -if he has disease progression on the next scan, I plan to change his treatment to FOLFOX which he previously tolerated well before his surgery.  He has some mild residual neuropathy.    2. Symptom management: Nausea, vomiting, weight loss, constipation -weight is stable lately -continue f/u with dietician  -He has had worsening constipation after chemo lately which causes nausea and vomiting. I will add compazine to his premeds today. -He is using oxycodone as needed for abdominal cramping. -He tried dulcolax and colace, but he took it only as needed. I again recommended he take colace or dulcolax more regularly, once daily for 3-5 days after chemo.   3. 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 Watt Climes showed internal hemorrhoids, benign polyps, diverticulosis and  colon cancer. -He was treated with oral iron, IV Feraheme on 05/03/19 and 05/16/19 (had reaction with 2nd dose).  -With cancer recurrence his anemia recurred. Hgb went down when he restarted  chemotherapy in 03/2021, recovering since 05/2021. Hgb stable in 11 range, stable at 10.9 today (09/17/21)   4. Goal of care discussion -The patient understands the goal of care is palliative. -I previously recommended DNR, he will think about it      PLAN:  -proceed with prior reduced dose FOLFIRI today -restaging CT to be done in 10-12 days -lab f/u and chemo in 2 weeks.   No problem-specific Assessment & Plan notes found for this encounter.   SUMMARY OF ONCOLOGIC HISTORY: Oncology History Overview Note  Cancer Staging Cancer of right colon [facility]) Staging form: Colon and Rectum, AJCC 8th Edition - Clinical stage from 04/09/2019: Stage IVC (cTX, cNX, pM1c) - Signed by Truitt Merle, MD on 05/03/2019     Cancer of right colon [facility]  04/09/2019 Procedure   Colonoscopy 04/09/19 by Dr Watt Climes IMPRESSION -internal hemorrhoids -Diverticulosis in the sigmoid colon  -2 small polyps in the rectum and in the proximal transverse colon, removed with a hot snare. Resected and retrieved.  -3 medium polyps in the proximal transverse colon, in the mid transverse colon and in the distal transverse colon, removed and resected and retrieved.  -likely malignant partially obstructing tumor at the hepatic flexure. biopsied, tattooed.  -1 large polyp in the mid ascending colon  -the examination was otherwise normal     04/09/2019 Initial Biopsy   FINAL MICROSCOPIC DIAGNOSIS: 04/09/19 1. LG intestine-hepatic flexure, Biopsy:   INVASIVE WELL DIFFERENTIATED ADENOCARCINOMA   04/09/2019 Cancer Staging   Staging form: Colon and Rectum, AJCC 8th Edition - Clinical stage from 04/09/2019: Stage IVC (cTX, cNX, pM1c) - Signed by Truitt Merle, MD on 05/03/2019   04/10/2019 Imaging   CT AP 04/10/19  IMPRESSION: 1. There is an eccentric mass of the colon involving the ascending colon near the hepatic flexure measuring approximately 3.4 x 3.4 by 2.0 cm (series 2, image 37, series 3, image 37). There is extensive soft tissue  nodularity of the mesocolon and omentum, and likely areas of the peritoneum, for example bilateral upper quadrants (series 2, image 25). Findings are consistent with primary colon malignancy, probable omental and peritoneal involvement, and small volume malignant ascites. 2.  Other chronic and incidental findings as detailed above.   04/20/2019 Initial Diagnosis   Cancer of right colon ([place])   04/26/2019 Imaging   CT Chest 04/26/19 IMPRESSION: 1. Borderline to mild lower thoracic adenopathy, including within the right internal mammary and juxta cardiophrenic stations. Given the appearance of the upper abdomen, suspicious for nodal metastasis. 2. A low right paratracheal node is borderline sized, but favored to be reactive. 3. No evidence of pulmonary metastasis. 4. Peritoneal metastasis and abdominal ascites, as before. 5. Pancreatic parenchymal calcifications indicative of chronic calcific pancreatitis. 6. Coronary artery atherosclerosis.   04/27/2019 Pathology Results   Diagnosis 04/27/19 Peritoneum, biopsy, right upper quadrant, perihepatic - ADENOCARCINOMA, CONSISTENT WITH COLONIC PRIMARY. - SEE COMMENT.   05/16/2019 - 10/31/2019 Chemotherapy   First line FOLFOX every 2 weeks with Avastin starting with cycle 2 starting 05/16/19. 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 Stopped after 10/31/19 for surgery.    07/23/2019 Imaging    CT AP W Contrast  IMPRESSION: 1.  Primary ascending colon mass may be minimally smaller. Peritoneal metastatic disease appears slightly improved as well. 2. Chronic calcific pancreatitis. 3. Tiny left renal stone. 4. Small left inguinal hernia contains a knuckle of unobstructed colon. 5. Enlarged prostate.   10/29/2019 Imaging   CT AP W Contrast IMPRESSION: No significant change in small  ascending colon soft tissue mass and diffuse omental carcinomatosis.   No new or progressive metastatic disease identified.   Stable small left inguinal hernia containing a loop of sigmoid colon. No evidence of bowel obstruction or strangulation.   Colonic diverticulosis. No radiographic evidence of diverticulitis.   Stable enlarged prostate.   12/17/2019 Pathology Results   HIPEC Surgery by Dr Clovis Riley 12/17/19 FINAL PATHOLOGIC DIAGNOSIS  MICROSCOPIC EXAMINATION AND DIAGNOSIS  A.          "RIGHT COLON, OMENTUM, SPLEEN":       Invasive adenocarcinoma with mucinous features, moderately differentiated.  Tumor involves mesentery and spleen.  Five out of ten lymph nodes involved by adenocarcinoma with perinodal soft tissue involvement (5/10), see comment.  Margins are uninvolved by invasive carcinoma.  Ileum and appendix, uninvolved.  See comment and cancer case summary.  B.          "ROUND LIGAMENT OF LIVER":       Involved by adenocarcinoma with mucinous features, moderately differentiated.  C.          "RIGHT DIAPHRAM STRIPPING":       Negative for carcinoma.  D.          "RIGHT HEPATIC CAPSULECTOMY":       Chronic inflammation and fibrosis.  Negative for carcinoma.  E.          "DEBRIDED TUMOR":       Involved by adenocarcinoma with mucinous features, moderately differentiated.   COMMENT: Sections disclose five out of ten lymph nodes involved by adenocarcinoma with perinodal soft tissue involvement. However, it is not possible to be certain if this represents lymph nodes replaced by tumor and/or soft tissue involvement. In addition, focal perineural invasion is seen.   04/03/2020 Imaging   CT AP W contrast  IMPRESSION: 1. Status post interval splenectomy and right hemicolectomy. 2. Response to therapy of omental/peritoneal metastasis. The only residual indeterminate finding is fluid density along the capsule of the hepatic dome which could be postoperative or  secondary to localized residual peritoneal disease. 3. Left nephrolithiasis. 4.  Possible constipation. 5. Prostatomegaly. 6.  Aortic Atherosclerosis (ICD10-I70.0).   07/11/2020 Imaging   CT AP  1.  Postsurgical changes of subtle reductive surgery including right hemicolectomy, omentectomy, splenectomy, and right hepatic capsulectomy.  2.  Interval development of multiple areas of low attenuation involving the liver, as described above. This the intraparenchymal low-density lesion is concerning for metastatic disease. Other disease along the capsule may be post therapy related. Residual thickening of the anterior peritoneum particularly anterior to the right lobe of the liver is also concerning for some residual disease although appears significantly improved. Recommend attention on follow-up.  3.  No definite evidence of residual peritoneal disease status post HIPEC.   09/18/2020 -  Chemotherapy   Second line chemo FOLFIRI and bevacizumab q2weeks starting 09/18/20. Chemo on hold since 12/23/20 due to fistula. RESTART with low dose irinotecan alone on 04/03/21.   12/19/2020 Imaging   CT CAP  IMPRESSION: 1. Interval development of a 1.6 x 2.7 x 1.6 cm collection of gas and debris between the sigmoid colon and dome of bladder, compatible with small abscess. A portion  of this abscess is probably intramural at the dome of bladder. Marked associated thickening of the adjacent colonic and bladder walls with tiny gas bubbles in the thickened bladder wall and prominent amount of free gas in the bladder lumen suggests an associated colovesical fistula. Tiny gas bubbles are also seen in the central prostate gland and may be in the urethra although parenchymal gas within the prostate gland is not excluded on this exam. 2. Nodularity along the major and minor fissures of the right hemithorax has decreased in the interval, nearly imperceptible today. 3. Peritoneal implants along the liver, right abdomen,  and deep to the left paramidline anterior abdominal wall have resolved in the interval by CT imaging. 4. Tiny right lower lobe pulmonary nodule is more conspicuous today. Attention on follow-up recommended. 5. Stable 4 mm hypodensity in the dome of the right liver, too small to characterize. 6. Nonobstructing left renal stones with bilateral renal cysts. 7. Stable compression deformity at multiple thoracic and lumbar levels. 8. Aortic Atherosclerosis (ICD10-I70.0).   02/24/2021 Surgery   LAPAROTOMY EXPLORATORY resection of sigmoid colon and proximal rectum, takedown of colovesical fistula, creation of end ileosotmy by Dr Clovis Riley    Final Pathologic Diagnosis      A.  SIGMOID COLON AND RECTUM, RESECTION:               Invasive adenocarcinoma with mucinous features, moderately differentiated.               Tumor measures 3.2 cm in greatest dimension. Tumor invades visceral peritoneum.  Lymphovascular invasion is present.               Metastatic adenocarcinoma involving seven (of 22) lymph nodes (7/22).  One margin involved by adenocarcinoma (grossly presumed distal margin).     03/25/2021 Imaging   IMPRESSION: 1. New bilateral pulmonary nodules worrisome for new pulmonary metastatic disease. 2. New small right pleural effusion with overlying atelectasis. 3. Recurrent pleural nodularity on the right. 4. No findings for abdominal/pelvic metastatic disease. 5. Stable severe atrophy of the pancreatic body and tail with associated main pancreatic duct dilatation. No obvious obstructing pancreatic lesion but this was not present on the prior CT scan from 04/03/2020. It may be due to a stricture or a small ductal lesion, not well seen. MRI abdomen without and with contrast may be helpful for further evaluation of this finding. 6. Stable surgical changes involving the colon with a Hartmann's pouch and left lower quadrant colostomy. 7. Stable enlarged prostate gland. 8. Stable thoracic  and lumbar compression fractures.   07/07/2021 Imaging   CT AP  IMPRESSION: 1. Redemonstrated postoperative findings of right hemicolectomy and ileocolic anastomosis as well as Hartmann procedure sigmoid colon resection and left lower quadrant end colostomy. 2. No evidence of recurrent or metastatic disease in the abdomen or pelvis. 3. Multiple small pulmonary nodules are not significantly changed compared to prior examination. There is unchanged thickening and nodularity along the pleural surfaces of the right lung. Findings remain consistent with pulmonary parenchymal and pleural metastatic disease. 4. Small right pleural effusion and associated atelectasis or consolidation, slightly increased compared to prior examination. 5. Status post splenectomy and cholecystectomy. 6. Prostatomegaly. 7. Coronary artery disease.   Aortic Atherosclerosis (ICD10-I70.0).      INTERVAL HISTORY:  Nathaniel Norton is here for a follow up of metastatic colon cancer. He was last seen by me on 09/17/21. He presents to the clinic alone. He reports the 10% increase in his dose continues to be  noticeable. He notes his symptoms used to go away before his next cycle, but now it seems more consistent. He also reports he has vomited after he receives the IV compazine the last two weeks.  He notes he is scheduled to meet with a GI doctor.   All other systems were reviewed with the patient and are negative.  MEDICAL HISTORY:  Past Medical History:  Diagnosis Date   colon ca dx'd 03/2019    SURGICAL HISTORY: Past Surgical History:  Procedure Laterality Date   CATARACT EXTRACTION     IR IMAGING GUIDED PORT INSERTION  05/10/2019   L4 fracture  2012   RETINAL DETACHMENT SURGERY     Right hand surgery  1974    I have reviewed the social history and family history with the patient and they are unchanged from previous note.  ALLERGIES:  is allergic to feraheme [ferumoxytol] and codeine.  MEDICATIONS:   Current Outpatient Medications  Medication Sig Dispense Refill   diphenoxylate-atropine (LOMOTIL) 2.5-0.025 MG tablet Take 1-2 tablets by mouth 4 (four) times daily as needed for diarrhea or loose stools. 90 tablet 1   fluticasone (FLONASE) 50 MCG/ACT nasal spray Place into the nose.     lidocaine-prilocaine (EMLA) cream Apply 1 application topically as needed. 30 g 1   Multiple Vitamin (MULTIVITAMIN) tablet Take 1 tablet by mouth daily.     MULTIPLE VITAMIN PO      ondansetron (ZOFRAN) 8 MG tablet Take 1 tablet (8 mg total) by mouth every 8 (eight) hours as needed for nausea or vomiting. Start on day 3 after chemotherapy 20 tablet 0   ondansetron (ZOFRAN-ODT) 4 MG disintegrating tablet Take 4 mg by mouth every 8 (eight) hours as needed.     oxyCODONE (OXY IR/ROXICODONE) 5 MG immediate release tablet Take 1 tablet (5 mg total) by mouth every 8 (eight) hours as needed for severe pain. 60 tablet 0   prochlorperazine (COMPAZINE) 10 MG tablet Take 1 tablet (10 mg total) by mouth every 6 (six) hours as needed (Nausea or vomiting). 30 tablet 2   sodium chloride flush 0.9 % SOLN injection      No current facility-administered medications for this visit.   Facility-Administered Medications Ordered in Other Visits  Medication Dose Route Frequency Provider Last Rate Last Admin   sodium chloride flush (NS) 0.9 % injection 10 mL  10 mL Intracatheter PRN Truitt Merle, MD   10 mL at 05/30/21 1145    PHYSICAL EXAMINATION: ECOG PERFORMANCE STATUS: 2 - Symptomatic, <50% confined to bed  Vitals:   10/01/21 0919  BP: 117/77  Pulse: 78  Resp: 17  Temp: 97.9 F (36.6 C)  SpO2: 100%   Wt Readings from Last 3 Encounters:  10/01/21 130 lb (59 kg)  09/17/21 132 lb 1.6 oz (59.9 kg)  09/03/21 130 lb 12.8 oz (59.3 kg)     GENERAL:alert, no distress and comfortable SKIN: skin color normal, no rashes or significant lesions EYES: normal, Conjunctiva are pink and non-injected, sclera clear  NEURO: alert &  oriented x 3 with fluent speech  LABORATORY DATA:  I have reviewed the data as listed CBC Latest Ref Rng & Units 10/01/2021 09/17/2021 09/03/2021  WBC 4.0 - 10.5 K/uL 7.1 8.6 6.5  Hemoglobin 13.0 - 17.0 g/dL 11.0(L) 10.9(L) 10.6(L)  Hematocrit 39.0 - 52.0 % 33.6(L) 33.2(L) 31.2(L)  Platelets 150 - 400 K/uL 272 285 287     CMP Latest Ref Rng & Units 10/01/2021 09/17/2021 09/03/2021  Glucose 70 -  99 mg/dL 96 88 96  BUN 8 - 23 mg/dL _0 Creatinine 0.61 - 1.24 mg/dL 1.19 1.18 0.86  Sodium 135 - 145 mmol/L 142 141 142  Potassium 3.5 - 5.1 mmol/L 3.7 3.9 4.0  Chloride 98 - 111 mmol/L 105 103 105  CO2 22 - 32 mmol/L _1 Calcium 8.9 - 10.3 mg/dL 9.5 9.6 9.5  Total Protein 6.5 - 8.1 g/dL 7.0 7.0 7.0  Total Bilirubin 0.3 - 1.2 mg/dL 0.7 0.4 0.4  Alkaline Phos 38 - 126 U/L 126 105 99  AST 15 - 41 U/L 49(H) 21 31  ALT 0 - 44 U/L 41 23 35      RADIOGRAPHIC STUDIES: I have personally reviewed the radiological images as listed and agreed with the findings in the report. No results found.    No orders of the defined types were placed in this encounter.  All questions were answered. The patient knows to call the clinic with any problems, questions or concerns. No barriers to learning was detected. The total time spent in the appointment was 30 minutes.     Truitt Merle, MD 10/01/2021   I, Wilburn Mylar, am acting as scribe for Truitt Merle, MD.   I have reviewed the above documentation for accuracy and completeness, and I agree with the above.

## 2021-10-01 NOTE — Patient Instructions (Signed)
[place] [facility] ONCOLOGY  Discharge Instructions: Thank you for choosing [facility] to provide your oncology and hematology care.   If you have a lab appointment with the [facility], please go directly to the [facility] and check in at the registration area.   Wear comfortable clothing and clothing appropriate for easy access to any Portacath or PICC line.   We strive to give you quality time with your provider. You may need to reschedule your appointment if you arrive late (15 or more minutes).  Arriving late affects you and other patients whose appointments are after yours.  Also, if you miss three or more appointments without notifying the office, you may be dismissed from the clinic at the provider's discretion.      For prescription refill requests, have your pharmacy contact our office and allow 72 hours for refills to be completed.    Today you received the following chemotherapy and/or immunotherapy agents: Irinotecan, Leucovorin, and Fluorouracil.    To help prevent nausea and vomiting after your treatment, we encourage you to take your nausea medication as directed.  BELOW ARE SYMPTOMS THAT SHOULD BE REPORTED IMMEDIATELY: *FEVER GREATER THAN 100.4 F (38 C) OR HIGHER *CHILLS OR SWEATING *NAUSEA AND VOMITING THAT IS NOT CONTROLLED WITH YOUR NAUSEA MEDICATION *UNUSUAL SHORTNESS OF BREATH *UNUSUAL BRUISING OR BLEEDING *URINARY PROBLEMS (pain or burning when urinating, or frequent urination) *BOWEL PROBLEMS (unusual diarrhea, constipation, pain near the anus) TENDERNESS IN MOUTH AND THROAT WITH OR WITHOUT PRESENCE OF ULCERS (sore throat, sores in mouth, or a toothache) UNUSUAL RASH, SWELLING OR PAIN  UNUSUAL VAGINAL DISCHARGE OR ITCHING   Items with * indicate a potential emergency and should be followed up as soon as possible or go to the Emergency Department if any problems should occur.  Please show the CHEMOTHERAPY ALERT CARD or  IMMUNOTHERAPY ALERT CARD at check-in to the Emergency Department and triage nurse.  Should you have questions after your visit or need to cancel or reschedule your appointment, please contact [facility]  Dept: [phone]  and follow the prompts.  Office hours are 8:00 a.m. to 4:30 p.m. Monday - Friday. Please note that voicemails left after 4:00 p.m. may not be returned until the following business day.  We are closed weekends and major holidays. You have access to a nurse at all times for urgent questions. Please call the main number to the clinic Dept: [phone] and follow the prompts.   For any non-urgent questions, you may also contact your provider using MyChart. We now offer e-Visits for anyone 71 and older to request care online for non-urgent symptoms. For details visit [email or web address].   Also download the MyChart app! Go to the app store, search "MyChart", open the app, select [facility], and log in with your MyChart username and password.  Due to Covid, a mask is required upon entering the hospital/clinic. If you do not have a mask, one will be given to you upon arrival. For doctor visits, patients may have 1 support person aged 48 or older with them. For treatment visits, patients cannot have anyone with them due to current Covid guidelines and our immunocompromised population.   The chemotherapy medication bag should finish at 46 hours, 96 hours, or 7 days. For example, if your pump is scheduled for 46 hours and it was put on at 4:00 p.m., it should finish at 2:00 p.m. the day it is scheduled to come off regardless of  your appointment time.     Estimated time to finish at:    If the display on your pump reads "Low Volume" and it is beeping, take the batteries out of the pump and come to the cancer center for it to be taken off.   If the pump alarms go off prior to the pump reading "Low Volume" then call [phone] and someone can assist  you.  If the plunger comes out and the chemotherapy medication is leaking out, please use your home chemo spill kit to clean up the spill. Do NOT use paper towels or other household products.  If you have problems or questions regarding your pump, please call either [phone] (24 hours a day) or the cancer center Monday-Friday 8:00 a.m.- 4:30 p.m. at the clinic number and we will assist you. If you are unable to get assistance, then go to the nearest Emergency Department and ask the staff to contact the IV team for assistance.

## 2021-10-03 ENCOUNTER — Other Ambulatory Visit: Payer: Self-pay

## 2021-10-03 ENCOUNTER — Inpatient Hospital Stay: Payer: Medicare Other

## 2021-10-03 VITALS — BP 126/79 | HR 97 | Temp 98.0°F | Resp 18

## 2021-10-03 DIAGNOSIS — D630 Anemia in neoplastic disease: Secondary | ICD-10-CM | POA: Diagnosis not present

## 2021-10-03 DIAGNOSIS — Z923 Personal history of irradiation: Secondary | ICD-10-CM | POA: Diagnosis not present

## 2021-10-03 DIAGNOSIS — Z452 Encounter for adjustment and management of vascular access device: Secondary | ICD-10-CM | POA: Diagnosis not present

## 2021-10-03 DIAGNOSIS — Z5111 Encounter for antineoplastic chemotherapy: Secondary | ICD-10-CM | POA: Diagnosis not present

## 2021-10-03 DIAGNOSIS — R634 Abnormal weight loss: Secondary | ICD-10-CM | POA: Diagnosis not present

## 2021-10-03 DIAGNOSIS — R112 Nausea with vomiting, unspecified: Secondary | ICD-10-CM | POA: Diagnosis not present

## 2021-10-03 DIAGNOSIS — C786 Secondary malignant neoplasm of retroperitoneum and peritoneum: Secondary | ICD-10-CM | POA: Diagnosis not present

## 2021-10-03 DIAGNOSIS — K5900 Constipation, unspecified: Secondary | ICD-10-CM | POA: Diagnosis not present

## 2021-10-03 DIAGNOSIS — Z95828 Presence of other vascular implants and grafts: Secondary | ICD-10-CM

## 2021-10-03 DIAGNOSIS — C182 Malignant neoplasm of ascending colon: Secondary | ICD-10-CM | POA: Diagnosis not present

## 2021-10-03 DIAGNOSIS — D500 Iron deficiency anemia secondary to blood loss (chronic): Secondary | ICD-10-CM

## 2021-10-03 MED ORDER — HEPARIN SOD (PORK) LOCK FLUSH 100 UNIT/ML IV SOLN: 500.0000 [IU] | Freq: Once | INTRAVENOUS | Status: DC | PRN

## 2021-10-03 MED ORDER — SODIUM CHLORIDE 0.9% FLUSH: 10.0000 mL | INTRAVENOUS | Status: DC | PRN

## 2021-10-05 ENCOUNTER — Telehealth: Payer: Self-pay

## 2021-10-05 DIAGNOSIS — K219 Gastro-esophageal reflux disease without esophagitis: Secondary | ICD-10-CM | POA: Diagnosis not present

## 2021-10-05 DIAGNOSIS — C801 Malignant (primary) neoplasm, unspecified: Secondary | ICD-10-CM | POA: Diagnosis not present

## 2021-10-05 DIAGNOSIS — Z933 Colostomy status: Secondary | ICD-10-CM | POA: Diagnosis not present

## 2021-10-05 NOTE — Telephone Encounter (Signed)
This nurse spoke with patient and made aware  of CT scan scheduled for 10/28 at 330 pm.  Patient does already have contrast.  Provided prep instructions.  Patient acknowledged understanding and is in agreement with the scheduled date and time.  No further questions or concerns at this time.

## 2021-10-09 ENCOUNTER — Other Ambulatory Visit: Payer: Self-pay

## 2021-10-09 ENCOUNTER — Ambulatory Visit (HOSPITAL_COMMUNITY)
Admission: RE | Admit: 2021-10-09 | Discharge: 2021-10-09 | Disposition: A | Discharge status: Home / Self Care | Payer: Medicare Other | Source: Ambulatory Visit | Attending: Hematology | Admitting: Hematology

## 2021-10-09 DIAGNOSIS — C182 Malignant neoplasm of ascending colon: Secondary | ICD-10-CM | POA: Insufficient documentation

## 2021-10-09 DIAGNOSIS — J90 Pleural effusion, not elsewhere classified: Secondary | ICD-10-CM | POA: Diagnosis not present

## 2021-10-09 DIAGNOSIS — C19 Malignant neoplasm of rectosigmoid junction: Secondary | ICD-10-CM | POA: Diagnosis not present

## 2021-10-09 DIAGNOSIS — I2510 Atherosclerotic heart disease of native coronary artery without angina pectoris: Secondary | ICD-10-CM | POA: Diagnosis not present

## 2021-10-09 DIAGNOSIS — J929 Pleural plaque without asbestos: Secondary | ICD-10-CM | POA: Diagnosis not present

## 2021-10-09 DIAGNOSIS — K6389 Other specified diseases of intestine: Secondary | ICD-10-CM | POA: Diagnosis not present

## 2021-10-09 DIAGNOSIS — I700 Atherosclerosis of aorta: Secondary | ICD-10-CM | POA: Diagnosis not present

## 2021-10-09 MED ORDER — HEPARIN SOD (PORK) LOCK FLUSH 100 UNIT/ML IV SOLN
500.0000 [IU] | Freq: Once | INTRAVENOUS | Status: AC
  Administered 2021-10-09: 500 [IU] via INTRAVENOUS

## 2021-10-09 MED ORDER — IOHEXOL 350 MG/ML SOLN
80.0000 mL | Freq: Once | INTRAVENOUS | Status: AC | PRN
  Administered 2021-10-09: 80 mL via INTRAVENOUS

## 2021-10-12 ENCOUNTER — Encounter: Payer: Self-pay | Admitting: Hematology

## 2021-10-14 DIAGNOSIS — C182 Malignant neoplasm of ascending colon: Secondary | ICD-10-CM | POA: Diagnosis not present

## 2021-10-14 MED FILL — Dexamethasone Sodium Phosphate Inj 100 MG/10ML: INTRAMUSCULAR | Qty: 1 | Status: AC

## 2021-10-15 ENCOUNTER — Inpatient Hospital Stay (HOSPITAL_BASED_OUTPATIENT_CLINIC_OR_DEPARTMENT_OTHER): Payer: Medicare Other | Admitting: Hematology

## 2021-10-15 ENCOUNTER — Inpatient Hospital Stay: Payer: Medicare Other | Attending: Hematology

## 2021-10-15 ENCOUNTER — Other Ambulatory Visit: Payer: Self-pay

## 2021-10-15 ENCOUNTER — Inpatient Hospital Stay: Payer: Medicare Other

## 2021-10-15 VITALS — BP 114/67 | HR 70 | Temp 97.8°F | Resp 16 | Ht 66.0 in | Wt 130.1 lb

## 2021-10-15 DIAGNOSIS — R112 Nausea with vomiting, unspecified: Secondary | ICD-10-CM | POA: Insufficient documentation

## 2021-10-15 DIAGNOSIS — Z95828 Presence of other vascular implants and grafts: Secondary | ICD-10-CM

## 2021-10-15 DIAGNOSIS — D630 Anemia in neoplastic disease: Secondary | ICD-10-CM | POA: Insufficient documentation

## 2021-10-15 DIAGNOSIS — C182 Malignant neoplasm of ascending colon: Secondary | ICD-10-CM

## 2021-10-15 DIAGNOSIS — C786 Secondary malignant neoplasm of retroperitoneum and peritoneum: Secondary | ICD-10-CM | POA: Diagnosis not present

## 2021-10-15 DIAGNOSIS — R634 Abnormal weight loss: Secondary | ICD-10-CM | POA: Insufficient documentation

## 2021-10-15 DIAGNOSIS — Z5111 Encounter for antineoplastic chemotherapy: Secondary | ICD-10-CM | POA: Diagnosis not present

## 2021-10-15 DIAGNOSIS — D500 Iron deficiency anemia secondary to blood loss (chronic): Secondary | ICD-10-CM

## 2021-10-15 DIAGNOSIS — K5900 Constipation, unspecified: Secondary | ICD-10-CM | POA: Diagnosis not present

## 2021-10-15 DIAGNOSIS — Z923 Personal history of irradiation: Secondary | ICD-10-CM | POA: Diagnosis not present

## 2021-10-15 LAB — CMP (CANCER CENTER ONLY)
ALT: 59 U/L — ABNORMAL HIGH (ref 0–44)
AST: 58 U/L — ABNORMAL HIGH (ref 15–41)
Albumin: 3.4 g/dL — ABNORMAL LOW (ref 3.5–5.0)
Alkaline Phosphatase: 154 U/L — ABNORMAL HIGH (ref 38–126)
Anion gap: 9 (ref 5–15)
BUN: 13 mg/dL (ref 8–23)
CO2: 29 mmol/L (ref 22–32)
Calcium: 9.3 mg/dL (ref 8.9–10.3)
Chloride: 103 mmol/L (ref 98–111)
Creatinine: 1.33 mg/dL — ABNORMAL HIGH (ref 0.61–1.24)
GFR, Estimated: 57 mL/min — ABNORMAL LOW (ref >60)
Glucose, Bld: 101 mg/dL — ABNORMAL HIGH (ref 70–99)
Potassium: 4 mmol/L (ref 3.5–5.1)
Sodium: 141 mmol/L (ref 135–145)
Total Bilirubin: 0.7 mg/dL (ref 0.3–1.2)
Total Protein: 7 g/dL (ref 6.5–8.1)

## 2021-10-15 LAB — CBC WITH DIFFERENTIAL (CANCER CENTER ONLY)
Abs Immature Granulocytes: 0.03 10*3/uL (ref 0.00–0.07)
Basophils Absolute: 0 10*3/uL (ref 0.0–0.1)
Basophils Relative: 0 %
Eosinophils Absolute: 0.1 10*3/uL (ref 0.0–0.5)
Eosinophils Relative: 1 %
HCT: 32.2 % — ABNORMAL LOW (ref 39.0–52.0)
Hemoglobin: 10.7 g/dL — ABNORMAL LOW (ref 13.0–17.0)
Immature Granulocytes: 0 %
Lymphocytes Relative: 29 %
Lymphs Abs: 2 10*3/uL (ref 0.7–4.0)
MCH: 27.8 pg (ref 26.0–34.0)
MCHC: 33.2 g/dL (ref 30.0–36.0)
MCV: 83.6 fL (ref 80.0–100.0)
Monocytes Absolute: 0.8 10*3/uL (ref 0.1–1.0)
Monocytes Relative: 11 %
Neutro Abs: 4 10*3/uL (ref 1.7–7.7)
Neutrophils Relative %: 59 %
Platelet Count: 279 10*3/uL (ref 150–400)
RBC: 3.85 MIL/uL — ABNORMAL LOW (ref 4.22–5.81)
RDW: 17.5 % — ABNORMAL HIGH (ref 11.5–15.5)
WBC Count: 6.9 10*3/uL (ref 4.0–10.5)
nRBC: 0 % (ref 0.0–0.2)

## 2021-10-15 LAB — IRON AND TIBC
Iron: 76 ug/dL (ref 42–163)
Saturation Ratios: 27 % (ref 20–55)
TIBC: 279 ug/dL (ref 202–409)
UIBC: 203 ug/dL (ref 117–376)

## 2021-10-15 LAB — CEA (IN HOUSE-CHCC): CEA (CHCC-In House): 19.48 ng/mL — ABNORMAL HIGH (ref 0.00–5.00)

## 2021-10-15 LAB — FERRITIN: Ferritin: 239 ng/mL (ref 24–336)

## 2021-10-15 MED ORDER — ATROPINE SULFATE 1 MG/ML IV SOLN
0.5000 mg | Freq: Once | INTRAVENOUS | Status: AC
  Administered 2021-10-15: 0.5 mg via INTRAVENOUS
  Filled 2021-10-15: qty 1

## 2021-10-15 MED ORDER — SODIUM CHLORIDE 0.9% FLUSH
10.0000 mL | INTRAVENOUS | Status: DC | PRN
  Administered 2021-10-15: 10 mL

## 2021-10-15 MED ORDER — PALONOSETRON HCL INJECTION 0.25 MG/5ML
0.2500 mg | Freq: Once | INTRAVENOUS | Status: AC
  Administered 2021-10-15: 0.25 mg via INTRAVENOUS
  Filled 2021-10-15: qty 5

## 2021-10-15 MED ORDER — SODIUM CHLORIDE 0.9 % IV SOLN
100.0000 mg/m2 | Freq: Once | INTRAVENOUS | Status: AC
  Administered 2021-10-15: 160 mg via INTRAVENOUS
  Filled 2021-10-15: qty 8

## 2021-10-15 MED ORDER — SODIUM CHLORIDE 0.9 % IV SOLN
400.0000 mg/m2 | Freq: Once | INTRAVENOUS | Status: AC
  Administered 2021-10-15: 652 mg via INTRAVENOUS
  Filled 2021-10-15: qty 32.6

## 2021-10-15 MED ORDER — SODIUM CHLORIDE 0.9 % IV SOLN
10.0000 mg | Freq: Once | INTRAVENOUS | Status: AC
  Administered 2021-10-15: 10 mg via INTRAVENOUS
  Filled 2021-10-15: qty 10

## 2021-10-15 MED ORDER — SODIUM CHLORIDE 0.9% FLUSH: 10.0000 mL | INTRAVENOUS | Status: DC | PRN

## 2021-10-15 MED ORDER — PROCHLORPERAZINE MALEATE 10 MG PO TABS
10.0000 mg | ORAL_TABLET | Freq: Once | ORAL | Status: AC
Start: 2021-10-15 — End: 2021-10-15
  Administered 2021-10-15: 10 mg via ORAL
  Filled 2021-10-15: qty 1

## 2021-10-15 MED ORDER — SODIUM CHLORIDE 0.9 % IV SOLN: Freq: Once | INTRAVENOUS | Status: AC

## 2021-10-15 MED ORDER — SODIUM CHLORIDE 0.9 % IV SOLN
2000.0000 mg/m2 | INTRAVENOUS | Status: DC
  Administered 2021-10-15: 3250 mg via INTRAVENOUS
  Filled 2021-10-15: qty 65

## 2021-10-15 NOTE — Patient Instructions (Signed)
[facility]  Discharge Instructions: Thank you for choosing [facility] to provide your oncology and hematology care.   If you have a lab appointment with the Cancer Center, please go directly to the Cancer Center and check in at the registration area.   Wear comfortable clothing and clothing appropriate for easy access to any Portacath or PICC line.   We strive to give you quality time with your provider. You may need to reschedule your appointment if you arrive late (15 or more minutes).  Arriving late affects you and other patients whose appointments are after yours.  Also, if you miss three or more appointments without notifying the office, you may be dismissed from the clinic at the provider's discretion.      For prescription refill requests, have your pharmacy contact our office and allow 72 hours for refills to be completed.    Today you received the following chemotherapy and/or immunotherapy agents: Irinotecan, Leucovorin, and Fluorouracil.    To help prevent nausea and vomiting after your treatment, we encourage you to take your nausea medication as directed.  BELOW ARE SYMPTOMS THAT SHOULD BE REPORTED IMMEDIATELY: *FEVER GREATER THAN 100.4 F (38 C) OR HIGHER *CHILLS OR SWEATING *NAUSEA AND VOMITING THAT IS NOT CONTROLLED WITH YOUR NAUSEA MEDICATION *UNUSUAL SHORTNESS OF BREATH *UNUSUAL BRUISING OR BLEEDING *URINARY PROBLEMS (pain or burning when urinating, or frequent urination) *BOWEL PROBLEMS (unusual diarrhea, constipation, pain near the anus) TENDERNESS IN MOUTH AND THROAT WITH OR WITHOUT PRESENCE OF ULCERS (sore throat, sores in mouth, or a toothache) UNUSUAL RASH, SWELLING OR PAIN  UNUSUAL VAGINAL DISCHARGE OR ITCHING   Items with * indicate a potential emergency and should be followed up as soon as possible or go to the Emergency Department if any problems should occur.  Please show the CHEMOTHERAPY ALERT CARD or  IMMUNOTHERAPY ALERT CARD at check-in to the Emergency Department and triage nurse.  Should you have questions after your visit or need to cancel or reschedule your appointment, please contact [facility]  Dept: [phone]  and follow the prompts.  Office hours are 8:00 a.m. to 4:30 p.m. Monday - Friday. Please note that voicemails left after 4:00 p.m. may not be returned until the following business day.  We are closed weekends and major holidays. You have access to a nurse at all times for urgent questions. Please call the main number to the clinic Dept: [phone] and follow the prompts.   For any non-urgent questions, you may also contact your provider using MyChart. We now offer e-Visits for anyone 18 and older to request care online for non-urgent symptoms. For details visit [email or web address].   Also download the MyChart app! Go to the app store, search "MyChart", open the app, select [facility], and log in with your MyChart username and password.  Due to Covid, a mask is required upon entering the hospital/clinic. If you do not have a mask, one will be given to you upon arrival. For doctor visits, patients may have 1 support person aged 18 or older with them. For treatment visits, patients cannot have anyone with them due to current Covid guidelines and our immunocompromised population.   The chemotherapy medication bag should finish at 46 hours, 96 hours, or 7 days. For example, if your pump is scheduled for 46 hours and it was put on at 4:00 p.m., it should finish at 2:00 p.m. the day it is scheduled to come off regardless of   your appointment time.     Estimated time to finish at:    If the display on your pump reads "Low Volume" and it is beeping, take the batteries out of the pump and come to the cancer center for it to be taken off.   If the pump alarms go off prior to the pump reading "Low Volume" then call [phone] and someone can assist  you.  If the plunger comes out and the chemotherapy medication is leaking out, please use your home chemo spill kit to clean up the spill. Do NOT use paper towels or other household products.  If you have problems or questions regarding your pump, please call either [phone] (24 hours a day) or the cancer center Monday-Friday 8:00 a.m.- 4:30 p.m. at the clinic number and we will assist you. If you are unable to get assistance, then go to the nearest Emergency Department and ask the staff to contact the IV team for assistance.    

## 2021-10-15 NOTE — Progress Notes (Signed)
Nathaniel Norton   [phone] [phone]   Clinic Follow up Note   Patient Care Team: Lajean Manes, MD as PCP - General (Internal Medicine) Berle Mull, MD as Consulting Physician (Family Medicine) Clarene Essex, MD as Consulting Physician (Gastroenterology) Truitt Merle, MD as Consulting Physician (Oncology)  Date of Service:  10/15/2021  CHIEF COMPLAINT: f/u of metastatic colon cancer  CURRENT THERAPY:  Second line chemo FOLFIRI and bevacizumab q2weeks starting 09/18/20. Chemo on hold since 12/23/20 due to fistula. RESTART with low dose on 04/03/21.  ASSESSMENT & PLAN:  Nathaniel Norton is a 70 y.o. male with   1. Cancer of right colon, with peritoneal and lung metastasis, stage IV,  MMR normal, KRAS mutation (+), MSS -He was diagnosed in 03/2019. His Colonoscopy biopsy showed adenocarcinoma of right hepatic flexure colon. His peritoneal biopsy from 04/27/19 shows adenocarcinoma, consistent with metastasis of his colon cancer. This is now stage IV disease. -He was treated with first-line FOLFOX in June-November 2020 before proceeding with HIPEC surgery by Dr. Clovis Riley on 12/17/19.  -09/10/20 PET showed disease progression with hypermetabolic pleural (right) and peritoneal nodules. A biopsy was not felt to be necessary given his previously known peritoneal metastasis -He started second line FOLFIRI and bevacizumab q2weeks on 09/18/20, tolerated first 2 cycles poorly with abdominal pain/cramping and diarrhea.  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 Clovis Riley on 02/24/21.   -Resumed low-dose single agent irinotecan on 04/03/2021, added low-dose 5-FU back on 05/14/21.  He has overall tolerated well. -we  increased his irinotecan dose by 10% with his 09/03/21 cycle, which he tolerated overall just with some slightly worsened symptoms and longer recovery time. He tried this dose for 2 cycles but did not tolerate well. We will return to his prior dose today. -his CEA has been slowly increasing. It recently jumped from 5.06 on 07/23/21 to 9.44 on 08/20/21 and to 16.92 on 09/17/21.  -restaging CT CAP showed a new 2.9 cm soft tissue mass within rectal stump at suture line, consistent with local recurrence, slight increase in pleural thickening, and slight enlargement of several pulmonary nodules. I reviewed the images and discussed the results with him.  -we discussed switching treatment, and I discussed different options. My recommendation is FOLFOX which he previously tolerated well before his surgery.  He has some mild residual neuropathy. he agrees to proceed    2. Symptom management: Nausea, vomiting, weight loss, constipation, mild neuropathy. -weight is stable lately -continue f/u with dietician  -He is using oxycodone as needed for abdominal cramping.   3. 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 Watt Climes showed internal hemorrhoids, benign polyps, diverticulosis and colon cancer. -He was treated with oral iron, IV Feraheme on 05/03/19 and 05/16/19 (had reaction with 2nd dose).  -With cancer recurrence his anemia recurred. Hgb went down when he restarted chemotherapy in 03/2021, recovering since 05/2021. Hgb stable in 10.5-11.5 range, stable at 10.7 today (10/15/21)   4. Goal of care discussion -The patient understands the goal of care is palliative. -I previously recommended DNR, he will think about it      PLAN:  -proceed with prior reduced dose FOLFIRI today -change 10/29/21 treatment to  FOLFOX with dose reduction  -lab, flush, f/u, and FOLFOX in 2 weeks    No problem-specific Assessment & Plan notes found for this  encounter.   SUMMARY OF ONCOLOGIC HISTORY: Oncology History Overview Note  Cancer Staging Cancer of right colon [facility]) Staging form: Colon and Rectum, AJCC 8th Edition - Clinical stage from 04/09/2019: Stage IVC (cTX, cNX, pM1c) - Signed by Truitt Merle, MD on 05/03/2019     Cancer of right colon [facility])  04/09/2019 Procedure   Colonoscopy 04/09/19 by Dr Watt Climes IMPRESSION -internal hemorrhoids -Diverticulosis in the sigmoid colon  -2 small polyps in the rectum and in the proximal transverse colon, removed with a hot snare. Resected and retrieved.  -3 medium polyps in the proximal transverse colon, in the mid transverse colon and in the distal transverse colon, removed and resected and retrieved.  -likely malignant partially obstructing tumor at the hepatic flexure. biopsied, tattooed.  -1 large polyp in the mid ascending colon  -the examination was otherwise normal     04/09/2019 Initial Biopsy   FINAL MICROSCOPIC DIAGNOSIS: 04/09/19 1. LG intestine-hepatic flexure, Biopsy:   INVASIVE WELL DIFFERENTIATED ADENOCARCINOMA   04/09/2019 Cancer Staging   Staging form: Colon and Rectum, AJCC 8th Edition - Clinical stage from 04/09/2019: Stage IVC (cTX, cNX, pM1c) - Signed by Truitt Merle, MD on 05/03/2019    04/10/2019 Imaging   CT AP 04/10/19  IMPRESSION: 1. There is an eccentric mass of the colon involving the ascending colon near the hepatic flexure measuring approximately 3.4 x 3.4 by 2.0 cm (series 2, image 37, series 3, image 37). There is extensive soft tissue nodularity of the mesocolon and omentum, and likely areas of the peritoneum, for example bilateral upper quadrants (series 2, image 25). Findings are consistent with primary colon malignancy, probable omental and peritoneal involvement, and small volume malignant ascites. 2.  Other chronic and incidental findings as detailed above.   04/20/2019 Initial Diagnosis   Cancer of right colon ([place])   04/26/2019 Imaging   CT Chest  04/26/19 IMPRESSION: 1. Borderline to mild lower thoracic adenopathy, including within the right internal mammary and juxta cardiophrenic stations. Given the appearance of the upper abdomen, suspicious for nodal metastasis. 2. A low right paratracheal node is borderline sized, but favored to be reactive. 3. No evidence of pulmonary metastasis. 4. Peritoneal metastasis and abdominal ascites, as before. 5. Pancreatic parenchymal calcifications indicative of chronic calcific pancreatitis. 6. Coronary artery atherosclerosis.   04/27/2019 Pathology Results   Diagnosis 04/27/19 Peritoneum, biopsy, right upper quadrant, perihepatic - ADENOCARCINOMA, CONSISTENT WITH COLONIC PRIMARY. - SEE COMMENT.   05/16/2019 - 10/31/2019 Chemotherapy   First line FOLFOX every 2 weeks with Avastin starting with cycle 2 starting 05/16/19. 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 Stopped after 10/31/19 for surgery.    07/23/2019 Imaging    CT AP W Contrast  IMPRESSION: 1. Primary ascending colon mass may be minimally smaller. Peritoneal metastatic disease appears slightly improved as well. 2. Chronic calcific pancreatitis. 3. Tiny left renal stone. 4. Small left inguinal hernia contains a knuckle of unobstructed colon. 5. Enlarged prostate.   10/29/2019 Imaging   CT AP W Contrast IMPRESSION: No significant change in small ascending colon soft tissue mass and diffuse omental carcinomatosis.   No new or progressive metastatic disease identified.   Stable small left inguinal hernia containing a loop of sigmoid colon. No  evidence of bowel obstruction or strangulation.   Colonic diverticulosis. No radiographic evidence of diverticulitis.   Stable enlarged prostate.   12/17/2019 Pathology Results   HIPEC Surgery by Dr Clovis Riley 12/17/19 FINAL  PATHOLOGIC DIAGNOSIS  MICROSCOPIC EXAMINATION AND DIAGNOSIS  A.          "RIGHT COLON, OMENTUM, SPLEEN":       Invasive adenocarcinoma with mucinous features, moderately differentiated.  Tumor involves mesentery and spleen.  Five out of ten lymph nodes involved by adenocarcinoma with perinodal soft tissue involvement (5/10), see comment.  Margins are uninvolved by invasive carcinoma.  Ileum and appendix, uninvolved.  See comment and cancer case summary.  B.          "ROUND LIGAMENT OF LIVER":       Involved by adenocarcinoma with mucinous features, moderately differentiated.  C.          "RIGHT DIAPHRAM STRIPPING":       Negative for carcinoma.  D.          "RIGHT HEPATIC CAPSULECTOMY":       Chronic inflammation and fibrosis.  Negative for carcinoma.  E.          "DEBRIDED TUMOR":       Involved by adenocarcinoma with mucinous features, moderately differentiated.   COMMENT: Sections disclose five out of ten lymph nodes involved by adenocarcinoma with perinodal soft tissue involvement. However, it is not possible to be certain if this represents lymph nodes replaced by tumor and/or soft tissue involvement. In addition, focal perineural invasion is seen.   04/03/2020 Imaging   CT AP W contrast  IMPRESSION: 1. Status post interval splenectomy and right hemicolectomy. 2. Response to therapy of omental/peritoneal metastasis. The only residual indeterminate finding is fluid density along the capsule of the hepatic dome which could be postoperative or secondary to localized residual peritoneal disease. 3. Left nephrolithiasis. 4.  Possible constipation. 5. Prostatomegaly. 6.  Aortic Atherosclerosis (ICD10-I70.0).   07/11/2020 Imaging   CT AP  1.  Postsurgical changes of subtle reductive surgery including right hemicolectomy, omentectomy, splenectomy, and right hepatic capsulectomy.  2.  Interval development of multiple areas of low attenuation involving the liver, as  described above. This the intraparenchymal low-density lesion is concerning for metastatic disease. Other disease along the capsule may be post therapy related. Residual thickening of the anterior peritoneum particularly anterior to the right lobe of the liver is also concerning for some residual disease although appears significantly improved. Recommend attention on follow-up.  3.  No definite evidence of residual peritoneal disease status post HIPEC.   09/18/2020 -  Chemotherapy   Second line chemo FOLFIRI and bevacizumab q2weeks starting 09/18/20. Chemo on hold since 12/23/20 due to fistula. RESTART with low dose irinotecan alone on 04/03/21.   12/19/2020 Imaging   CT CAP  IMPRESSION: 1. Interval development of a 1.6 x 2.7 x 1.6 cm collection of gas and debris between the sigmoid colon and dome of bladder, compatible with small abscess. A portion of this abscess is probably intramural at the dome of bladder. Marked associated thickening of the adjacent colonic and bladder walls with tiny gas bubbles in the thickened bladder wall and prominent amount of free gas in the bladder lumen suggests an associated colovesical fistula. Tiny gas bubbles are also seen in the central prostate gland and may be in the urethra although parenchymal gas within the prostate gland is not excluded on this exam. 2. Nodularity along the major and minor fissures of the right hemithorax  has decreased in the interval, nearly imperceptible today. 3. Peritoneal implants along the liver, right abdomen, and deep to the left paramidline anterior abdominal wall have resolved in the interval by CT imaging. 4. Tiny right lower lobe pulmonary nodule is more conspicuous today. Attention on follow-up recommended. 5. Stable 4 mm hypodensity in the dome of the right liver, too small to characterize. 6. Nonobstructing left renal stones with bilateral renal cysts. 7. Stable compression deformity at multiple thoracic and  lumbar levels. 8. Aortic Atherosclerosis (ICD10-I70.0).   02/24/2021 Surgery   LAPAROTOMY EXPLORATORY resection of sigmoid colon and proximal rectum, takedown of colovesical fistula, creation of end ileosotmy by Dr Clovis Riley    Final Pathologic Diagnosis      A.  SIGMOID COLON AND RECTUM, RESECTION:               Invasive adenocarcinoma with mucinous features, moderately differentiated.               Tumor measures 3.2 cm in greatest dimension. Tumor invades visceral peritoneum.  Lymphovascular invasion is present.               Metastatic adenocarcinoma involving seven (of 22) lymph nodes (7/22).  One margin involved by adenocarcinoma (grossly presumed distal margin).     03/25/2021 Imaging   IMPRESSION: 1. New bilateral pulmonary nodules worrisome for new pulmonary metastatic disease. 2. New small right pleural effusion with overlying atelectasis. 3. Recurrent pleural nodularity on the right. 4. No findings for abdominal/pelvic metastatic disease. 5. Stable severe atrophy of the pancreatic body and tail with associated main pancreatic duct dilatation. No obvious obstructing pancreatic lesion but this was not present on the prior CT scan from 04/03/2020. It may be due to a stricture or a small ductal lesion, not well seen. MRI abdomen without and with contrast may be helpful for further evaluation of this finding. 6. Stable surgical changes involving the colon with a Hartmann's pouch and left lower quadrant colostomy. 7. Stable enlarged prostate gland. 8. Stable thoracic and lumbar compression fractures.   07/07/2021 Imaging   CT AP  IMPRESSION: 1. Redemonstrated postoperative findings of right hemicolectomy and ileocolic anastomosis as well as Hartmann procedure sigmoid colon resection and left lower quadrant end colostomy. 2. No evidence of recurrent or metastatic disease in the abdomen or pelvis. 3. Multiple small pulmonary nodules are not significantly changed compared to  prior examination. There is unchanged thickening and nodularity along the pleural surfaces of the right lung. Findings remain consistent with pulmonary parenchymal and pleural metastatic disease. 4. Small right pleural effusion and associated atelectasis or consolidation, slightly increased compared to prior examination. 5. Status post splenectomy and cholecystectomy. 6. Prostatomegaly. 7. Coronary artery disease.   Aortic Atherosclerosis (ICD10-I70.0).      INTERVAL HISTORY:  Nathaniel Norton is here for a follow up of metastatic colon cancer. He was last seen by me on 10/01/21. He presents to the clinic alone. He reports he had two very good days last week (Tuesday and Wednesday, "the best I've felt in a while") but subsequently felt sick again with bloating on Thursday. He reports continued mild neuropathy-- he notes he can still button most buttons.   All other systems were reviewed with the patient and are negative.  MEDICAL HISTORY:  Past Medical History:  Diagnosis Date   colon ca dx'd 03/2019    SURGICAL HISTORY: Past Surgical History:  Procedure Laterality Date   CATARACT EXTRACTION     IR IMAGING GUIDED PORT INSERTION  05/10/2019  L4 fracture  2012   RETINAL DETACHMENT SURGERY     Right hand surgery  1974    I have reviewed the social history and family history with the patient and they are unchanged from previous note.  ALLERGIES:  is allergic to feraheme [ferumoxytol] and codeine.  MEDICATIONS:  Current Outpatient Medications  Medication Sig Dispense Refill   diphenoxylate-atropine (LOMOTIL) 2.5-0.025 MG tablet Take 1-2 tablets by mouth 4 (four) times daily as needed for diarrhea or loose stools. 90 tablet 1   fluticasone (FLONASE) 50 MCG/ACT nasal spray Place into the nose.     lidocaine-prilocaine (EMLA) cream Apply 1 application topically as needed. 30 g 1   Multiple Vitamin (MULTIVITAMIN) tablet Take 1 tablet by mouth daily.     MULTIPLE VITAMIN PO       ondansetron (ZOFRAN) 8 MG tablet Take 1 tablet (8 mg total) by mouth every 8 (eight) hours as needed for nausea or vomiting. Start on day 3 after chemotherapy 20 tablet 0   ondansetron (ZOFRAN-ODT) 4 MG disintegrating tablet Take 4 mg by mouth every 8 (eight) hours as needed.     oxyCODONE (OXY IR/ROXICODONE) 5 MG immediate release tablet Take 1 tablet (5 mg total) by mouth every 8 (eight) hours as needed for severe pain. 60 tablet 0   prochlorperazine (COMPAZINE) 10 MG tablet Take 1 tablet (10 mg total) by mouth every 6 (six) hours as needed (Nausea or vomiting). 30 tablet 2   sodium chloride flush 0.9 % SOLN injection      No current facility-administered medications for this visit.   Facility-Administered Medications Ordered in Other Visits  Medication Dose Route Frequency Provider Last Rate Last Admin   sodium chloride flush (NS) 0.9 % injection 10 mL  10 mL Intracatheter PRN Truitt Merle, MD   10 mL at 05/30/21 1145   sodium chloride flush (NS) 0.9 % injection 10 mL  10 mL Intracatheter PRN Truitt Merle, MD   10 mL at 10/15/21 0906    PHYSICAL EXAMINATION: ECOG PERFORMANCE STATUS: 1 - Symptomatic but completely ambulatory  There were no vitals filed for this visit. Wt Readings from Last 3 Encounters:  10/01/21 130 lb (59 kg)  09/17/21 132 lb 1.6 oz (59.9 kg)  09/03/21 130 lb 12.8 oz (59.3 kg)     GENERAL:alert, no distress and comfortable SKIN: skin color normal, no rashes or significant lesions EYES: normal, Conjunctiva are pink and non-injected, sclera clear  NEURO: alert & oriented x 3 with fluent speech  LABORATORY DATA:  I have reviewed the data as listed CBC Latest Ref Rng & Units 10/01/2021 09/17/2021 09/03/2021  WBC 4.0 - 10.5 K/uL 7.1 8.6 6.5  Hemoglobin 13.0 - 17.0 g/dL 11.0(L) 10.9(L) 10.6(L)  Hematocrit 39.0 - 52.0 % 33.6(L) 33.2(L) 31.2(L)  Platelets 150 - 400 K/uL 272 285 287     CMP Latest Ref Rng & Units 10/01/2021 09/17/2021 09/03/2021  Glucose 70 - 99 mg/dL 96 88  96  BUN 8 - 23 mg/dL '11 9 12  ' Creatinine 0.61 - 1.24 mg/dL 1.19 1.18 0.86  Sodium 135 - 145 mmol/L 142 141 142  Potassium 3.5 - 5.1 mmol/L 3.7 3.9 4.0  Chloride 98 - 111 mmol/L 105 103 105  CO2 22 - 32 mmol/L '27 28 28  ' Calcium 8.9 - 10.3 mg/dL 9.5 9.6 9.5  Total Protein 6.5 - 8.1 g/dL 7.0 7.0 7.0  Total Bilirubin 0.3 - 1.2 mg/dL 0.7 0.4 0.4  Alkaline Phos 38 - 126 U/L 126 105  99  AST 15 - 41 U/L 49(H) 21 31  ALT 0 - 44 U/L 41 23 35      RADIOGRAPHIC STUDIES: I have personally reviewed the radiological images as listed and agreed with the findings in the report. No results found.    No orders of the defined types were placed in this encounter.  All questions were answered. The patient knows to call the clinic with any problems, questions or concerns. No barriers to learning was detected. The total time spent in the appointment was 30 minutes.     Truitt Merle, MD 10/15/2021   I, Wilburn Mylar, am acting as scribe for Truitt Merle, MD.   I have reviewed the above documentation for accuracy and completeness, and I agree with the above.

## 2021-10-17 ENCOUNTER — Other Ambulatory Visit: Payer: Self-pay

## 2021-10-17 ENCOUNTER — Encounter: Payer: Self-pay | Admitting: Hematology

## 2021-10-17 ENCOUNTER — Inpatient Hospital Stay: Payer: Medicare Other

## 2021-10-17 VITALS — BP 129/81 | HR 102 | Temp 97.5°F | Resp 20

## 2021-10-17 DIAGNOSIS — K5900 Constipation, unspecified: Secondary | ICD-10-CM | POA: Diagnosis not present

## 2021-10-17 DIAGNOSIS — Z5111 Encounter for antineoplastic chemotherapy: Secondary | ICD-10-CM | POA: Diagnosis not present

## 2021-10-17 DIAGNOSIS — D630 Anemia in neoplastic disease: Secondary | ICD-10-CM | POA: Diagnosis not present

## 2021-10-17 DIAGNOSIS — Z923 Personal history of irradiation: Secondary | ICD-10-CM | POA: Diagnosis not present

## 2021-10-17 DIAGNOSIS — R634 Abnormal weight loss: Secondary | ICD-10-CM | POA: Diagnosis not present

## 2021-10-17 DIAGNOSIS — C182 Malignant neoplasm of ascending colon: Secondary | ICD-10-CM | POA: Diagnosis not present

## 2021-10-17 DIAGNOSIS — C786 Secondary malignant neoplasm of retroperitoneum and peritoneum: Secondary | ICD-10-CM | POA: Diagnosis not present

## 2021-10-17 DIAGNOSIS — R112 Nausea with vomiting, unspecified: Secondary | ICD-10-CM | POA: Diagnosis not present

## 2021-10-17 MED ORDER — SODIUM CHLORIDE 0.9% FLUSH
10.0000 mL | INTRAVENOUS | Status: DC | PRN
  Administered 2021-10-17: 10 mL

## 2021-10-17 MED ORDER — HEPARIN SOD (PORK) LOCK FLUSH 100 UNIT/ML IV SOLN
500.0000 [IU] | Freq: Once | INTRAVENOUS | Status: AC | PRN
  Administered 2021-10-17: 500 [IU]

## 2021-10-17 NOTE — Progress Notes (Signed)
DISCONTINUE ON PATHWAY REGIMEN - Colorectal     A cycle is every 14 days:     Bevacizumab-xxxx      Irinotecan      Leucovorin      Fluorouracil      Fluorouracil   **Always confirm dose/schedule in your pharmacy ordering system**  REASON: Disease Progression PRIOR TREATMENT: [id number]: FOLFIRI + Bevacizumab q14 Days TREATMENT RESPONSE: Partial Response (PR)  START ON PATHWAY REGIMEN - Colorectal     A cycle is every 14 days:     Oxaliplatin      Leucovorin      Fluorouracil      Fluorouracil   **Always confirm dose/schedule in your pharmacy ordering system**  Patient Characteristics: Distant Metastases, Nonsurgical Candidate, KRAS/NRAS Mutation Positive/Unknown (BRAF V600 Wild-Type/Unknown), Standard Cytotoxic Therapy, Second Line Standard Cytotoxic Therapy, Bevacizumab Ineligible Tumor Location: Colon Therapeutic Status: Distant Metastases Microsatellite/Mismatch Repair Status: MSS/pMMR BRAF Mutation Status: Wild-Type (no mutation) KRAS/NRAS Mutation Status: Mutation Positive Standard Cytotoxic Line of Therapy: Second Line Standard Cytotoxic Therapy Bevacizumab Eligibility: Ineligible Intent of Therapy: Non-Curative / Palliative Intent, Discussed with Patient

## 2021-10-17 NOTE — Progress Notes (Signed)
ON PATHWAY REGIMEN - Colorectal  No Change  Continue With Treatment as Ordered.  Original Decision Date/Time: 09/11/2020 09:51     A cycle is every 14 days:     Bevacizumab-xxxx      Irinotecan      Leucovorin      Fluorouracil      Fluorouracil   **Always confirm dose/schedule in your pharmacy ordering system**  Patient Characteristics: Distant Metastases, Nonsurgical Candidate, KRAS/NRAS Mutation Positive/Unknown (BRAF V600 Wild-Type/Unknown), Standard Cytotoxic Therapy, Second Line Standard Cytotoxic Therapy, Bevacizumab Eligible Tumor Location: Colon Therapeutic Status: Distant Metastases Microsatellite/Mismatch Repair Status: MSS/pMMR BRAF Mutation Status: Wild-Type (no mutation) KRAS/NRAS Mutation Status: Mutation Positive Standard Cytotoxic Line of Therapy: Second Line Standard Cytotoxic Therapy Bevacizumab Eligibility: Eligible Intent of Therapy: Non-Curative / Palliative Intent, Discussed with Patient

## 2021-10-19 ENCOUNTER — Telehealth: Payer: Self-pay | Admitting: Hematology

## 2021-10-19 DIAGNOSIS — Z85038 Personal history of other malignant neoplasm of large intestine: Secondary | ICD-10-CM | POA: Diagnosis not present

## 2021-10-19 DIAGNOSIS — Z933 Colostomy status: Secondary | ICD-10-CM | POA: Diagnosis not present

## 2021-10-19 NOTE — Telephone Encounter (Signed)
Scheduled follow-up appointments per 11/3 los. Patient is aware. 

## 2021-10-29 ENCOUNTER — Inpatient Hospital Stay (HOSPITAL_BASED_OUTPATIENT_CLINIC_OR_DEPARTMENT_OTHER): Payer: Medicare Other | Admitting: Hematology

## 2021-10-29 ENCOUNTER — Inpatient Hospital Stay: Payer: Medicare Other

## 2021-10-29 ENCOUNTER — Encounter: Payer: Self-pay | Admitting: Hematology

## 2021-10-29 ENCOUNTER — Other Ambulatory Visit: Payer: Self-pay

## 2021-10-29 VITALS — BP 116/70 | HR 80 | Temp 97.6°F | Resp 17 | Wt 129.4 lb

## 2021-10-29 DIAGNOSIS — R112 Nausea with vomiting, unspecified: Secondary | ICD-10-CM | POA: Diagnosis not present

## 2021-10-29 DIAGNOSIS — C182 Malignant neoplasm of ascending colon: Secondary | ICD-10-CM

## 2021-10-29 DIAGNOSIS — Z5111 Encounter for antineoplastic chemotherapy: Secondary | ICD-10-CM | POA: Diagnosis not present

## 2021-10-29 DIAGNOSIS — D630 Anemia in neoplastic disease: Secondary | ICD-10-CM | POA: Diagnosis not present

## 2021-10-29 DIAGNOSIS — C786 Secondary malignant neoplasm of retroperitoneum and peritoneum: Secondary | ICD-10-CM | POA: Diagnosis not present

## 2021-10-29 DIAGNOSIS — R634 Abnormal weight loss: Secondary | ICD-10-CM | POA: Diagnosis not present

## 2021-10-29 DIAGNOSIS — K5900 Constipation, unspecified: Secondary | ICD-10-CM | POA: Diagnosis not present

## 2021-10-29 DIAGNOSIS — Z923 Personal history of irradiation: Secondary | ICD-10-CM | POA: Diagnosis not present

## 2021-10-29 LAB — CBC WITH DIFFERENTIAL (CANCER CENTER ONLY)
Abs Immature Granulocytes: 0.01 10*3/uL (ref 0.00–0.07)
Basophils Absolute: 0 10*3/uL (ref 0.0–0.1)
Basophils Relative: 1 %
Eosinophils Absolute: 0.1 10*3/uL (ref 0.0–0.5)
Eosinophils Relative: 1 %
HCT: 32.5 % — ABNORMAL LOW (ref 39.0–52.0)
Hemoglobin: 10.7 g/dL — ABNORMAL LOW (ref 13.0–17.0)
Immature Granulocytes: 0 %
Lymphocytes Relative: 33 %
Lymphs Abs: 2.1 10*3/uL (ref 0.7–4.0)
MCH: 27.3 pg (ref 26.0–34.0)
MCHC: 32.9 g/dL (ref 30.0–36.0)
MCV: 82.9 fL (ref 80.0–100.0)
Monocytes Absolute: 0.7 10*3/uL (ref 0.1–1.0)
Monocytes Relative: 11 %
Neutro Abs: 3.4 10*3/uL (ref 1.7–7.7)
Neutrophils Relative %: 54 %
Platelet Count: 267 10*3/uL (ref 150–400)
RBC: 3.92 MIL/uL — ABNORMAL LOW (ref 4.22–5.81)
RDW: 17.8 % — ABNORMAL HIGH (ref 11.5–15.5)
WBC Count: 6.2 10*3/uL (ref 4.0–10.5)
nRBC: 0 % (ref 0.0–0.2)

## 2021-10-29 LAB — CMP (CANCER CENTER ONLY)
ALT: 44 U/L (ref 0–44)
AST: 33 U/L (ref 15–41)
Albumin: 3.5 g/dL (ref 3.5–5.0)
Alkaline Phosphatase: 182 U/L — ABNORMAL HIGH (ref 38–126)
Anion gap: 12 (ref 5–15)
BUN: 9 mg/dL (ref 8–23)
CO2: 26 mmol/L (ref 22–32)
Calcium: 9.8 mg/dL (ref 8.9–10.3)
Chloride: 102 mmol/L (ref 98–111)
Creatinine: 1.47 mg/dL — ABNORMAL HIGH (ref 0.61–1.24)
GFR, Estimated: 50 mL/min — ABNORMAL LOW (ref >60)
Glucose, Bld: 113 mg/dL — ABNORMAL HIGH (ref 70–99)
Potassium: 3.8 mmol/L (ref 3.5–5.1)
Sodium: 140 mmol/L (ref 135–145)
Total Bilirubin: 0.9 mg/dL (ref 0.3–1.2)
Total Protein: 6.8 g/dL (ref 6.5–8.1)

## 2021-10-29 LAB — CEA (IN HOUSE-CHCC): CEA (CHCC-In House): 24.22 ng/mL — ABNORMAL HIGH (ref 0.00–5.00)

## 2021-10-29 MED ORDER — OXALIPLATIN CHEMO INJECTION 100 MG/20ML
50.0000 mg/m2 | Freq: Once | INTRAVENOUS | Status: AC
  Administered 2021-10-29: 11:00:00 85 mg via INTRAVENOUS
  Filled 2021-10-29: qty 17

## 2021-10-29 MED ORDER — HEPARIN SOD (PORK) LOCK FLUSH 100 UNIT/ML IV SOLN: 500.0000 [IU] | Freq: Once | INTRAVENOUS | Status: DC | PRN

## 2021-10-29 MED ORDER — SODIUM CHLORIDE 0.9 % IV SOLN
10.0000 mg | Freq: Once | INTRAVENOUS | Status: AC
  Administered 2021-10-29: 10:00:00 10 mg via INTRAVENOUS
  Filled 2021-10-29: qty 10

## 2021-10-29 MED ORDER — SODIUM CHLORIDE 0.9 % IV SOLN
2000.0000 mg/m2 | INTRAVENOUS | Status: DC
  Administered 2021-10-29: 13:00:00 3300 mg via INTRAVENOUS
  Filled 2021-10-29: qty 66

## 2021-10-29 MED ORDER — SODIUM CHLORIDE 0.9% FLUSH: 10.0000 mL | INTRAVENOUS | Status: DC | PRN

## 2021-10-29 MED ORDER — PALONOSETRON HCL INJECTION 0.25 MG/5ML
0.2500 mg | Freq: Once | INTRAVENOUS | Status: AC
  Administered 2021-10-29: 10:00:00 0.25 mg via INTRAVENOUS
  Filled 2021-10-29: qty 5

## 2021-10-29 MED ORDER — DEXTROSE 5 % IV SOLN: Freq: Once | INTRAVENOUS | Status: AC

## 2021-10-29 MED ORDER — SODIUM CHLORIDE 0.9 % IV SOLN: INTRAVENOUS | Status: AC

## 2021-10-29 MED ORDER — LEUCOVORIN CALCIUM INJECTION 350 MG
400.0000 mg/m2 | Freq: Once | INTRAVENOUS | Status: AC
  Administered 2021-10-29: 11:00:00 664 mg via INTRAVENOUS
  Filled 2021-10-29: qty 33.2

## 2021-10-29 NOTE — Progress Notes (Signed)
[name]   [phone] Fax:([phone]) [phone]   Clinic Follow up Note   Patient Care Team: Lajean Manes, MD as PCP - General (Internal Medicine) Berle Mull, MD as Consulting Physician (Family Medicine) Clarene Essex, MD as Consulting Physician (Gastroenterology) Truitt Merle, MD as Consulting Physician (Oncology)  Date of Service:  10/29/2021  CHIEF COMPLAINT: f/u of metastatic colon cancer  CURRENT THERAPY:  FOLFOX, starting 10/29/21  ASSESSMENT & PLAN:  Nathaniel Norton is a 70 y.o. male with   1. Cancer of right colon, with peritoneal and lung metastasis, stage IV,  MMR normal, KRAS mutation (+), MSS -He was diagnosed in 03/2019. His Colonoscopy biopsy showed adenocarcinoma of right hepatic flexure colon. His peritoneal biopsy from 04/27/19 shows adenocarcinoma, consistent with metastasis of his colon cancer. This is now stage IV disease. -He was treated with first-line FOLFOX in June-November 2020 before proceeding with HIPEC surgery by Dr. Clovis Riley on 12/17/19.  -09/10/20 PET showed disease progression with hypermetabolic pleural (right) and peritoneal nodules. A biopsy was not felt to be necessary given his previously known peritoneal metastasis -He started second line FOLFIRI and bevacizumab q2weeks on 09/18/20, tolerated first 2 cycles poorly with abdominal pain/cramping and diarrhea.  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 Clovis Riley on 02/24/21.   -Resumed low-dose single agent irinotecan on 04/03/2021, added low-dose 5-FU back on 05/14/21.  He overall tolerated the reduced dose well. He tried increased dose for 2 cycles but did not tolerate well. However, his CEA has been slowly  increasing. -restaging CT CAP 10/09/21 showed a new 2.9 cm soft tissue mass within rectal stump at suture line, consistent with local recurrence, slight increase in pleural thickening, and slight enlargement of several pulmonary nodules.  -Accordingly, we are switching his treatment to FOLFOX today, as he previously tolerated well. He will start at a 25-30% dose reduction. -labs reviewed, his kidney function is slightly worsened today. We will give him IVF today. He declined more IVF on day 3 but agrees with more oral fluids    2. Symptom management: Nausea, vomiting, weight loss, constipation, mild neuropathy. -weight is stable lately -continue f/u with dietician  -He is using oxycodone as needed for abdominal cramping.   3. 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 Watt Climes showed internal hemorrhoids, benign polyps, diverticulosis and colon cancer. -He was treated with oral iron, IV Feraheme on 05/03/19 and 05/16/19 (had reaction with 2nd dose).  -With cancer recurrence his anemia recurred. Hgb went down when he restarted chemotherapy in 03/2021, recovering since 05/2021. Hgb stable in 10.5-11.5 range, stable at 10.7 today (10/29/21)   4. Goal of care discussion -The patient understands the goal of care is palliative. -I previously recommended DNR, he will think about it      PLAN:  -proceed with C1 FOLFOX with dose reduction today, and NS 51ml for hydration  -lab, flush, f/u, and FOLFOX in 2 weeks    No problem-specific Assessment & Plan notes found for this encounter.   SUMMARY OF ONCOLOGIC HISTORY: Oncology History Overview Note  Cancer Staging Cancer of right colon [facility]) Staging form: Colon and Rectum, AJCC 8th Edition - Clinical stage from 04/09/2019: Stage IVC (cTX,  cNX, pM1c) - Signed by Truitt Merle, MD on 05/03/2019     Cancer of right colon [facility])  04/09/2019 Procedure   Colonoscopy 04/09/19 by Dr  Watt Climes IMPRESSION -internal hemorrhoids -Diverticulosis in the sigmoid colon  -2 small polyps in the rectum and in the proximal transverse colon, removed with a hot snare. Resected and retrieved.  -3 medium polyps in the proximal transverse colon, in the mid transverse colon and in the distal transverse colon, removed and resected and retrieved.  -likely malignant partially obstructing tumor at the hepatic flexure. biopsied, tattooed.  -1 large polyp in the mid ascending colon  -the examination was otherwise normal     04/09/2019 Initial Biopsy   FINAL MICROSCOPIC DIAGNOSIS: 04/09/19 1. LG intestine-hepatic flexure, Biopsy:   INVASIVE WELL DIFFERENTIATED ADENOCARCINOMA   04/09/2019 Cancer Staging   Staging form: Colon and Rectum, AJCC 8th Edition - Clinical stage from 04/09/2019: Stage IVC (cTX, cNX, pM1c) - Signed by Truitt Merle, MD on 05/03/2019    04/10/2019 Imaging   CT AP 04/10/19  IMPRESSION: 1. There is an eccentric mass of the colon involving the ascending colon near the hepatic flexure measuring approximately 3.4 x 3.4 by 2.0 cm (series 2, image 37, series 3, image 37). There is extensive soft tissue nodularity of the mesocolon and omentum, and likely areas of the peritoneum, for example bilateral upper quadrants (series 2, image 25). Findings are consistent with primary colon malignancy, probable omental and peritoneal involvement, and small volume malignant ascites. 2.  Other chronic and incidental findings as detailed above.   04/20/2019 Initial Diagnosis   Cancer of right colon ([place])   04/26/2019 Imaging   CT Chest 04/26/19 IMPRESSION: 1. Borderline to mild lower thoracic adenopathy, including within the right internal mammary and juxta cardiophrenic stations. Given the appearance of the upper abdomen, suspicious for nodal metastasis. 2. A low right paratracheal node is borderline sized, but favored to be reactive. 3. No evidence of pulmonary metastasis. 4. Peritoneal  metastasis and abdominal ascites, as before. 5. Pancreatic parenchymal calcifications indicative of chronic calcific pancreatitis. 6. Coronary artery atherosclerosis.   04/27/2019 Pathology Results   Diagnosis 04/27/19 Peritoneum, biopsy, right upper quadrant, perihepatic - ADENOCARCINOMA, CONSISTENT WITH COLONIC PRIMARY. - SEE COMMENT.   05/16/2019 - 10/31/2019 Chemotherapy   First line FOLFOX every 2 weeks with Avastin starting with cycle 2 starting 05/16/19. 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 Stopped after 10/31/19 for surgery.    07/23/2019 Imaging    CT AP W Contrast  IMPRESSION: 1. Primary ascending colon mass may be minimally smaller. Peritoneal metastatic disease appears slightly improved as well. 2. Chronic calcific pancreatitis. 3. Tiny left renal stone. 4. Small left inguinal hernia contains a knuckle of unobstructed colon. 5. Enlarged prostate.   10/29/2019 Imaging   CT AP W Contrast IMPRESSION: No significant change in small ascending colon soft tissue mass and diffuse omental carcinomatosis.   No new or progressive metastatic disease identified.   Stable small left inguinal hernia containing a loop of sigmoid colon. No evidence of bowel obstruction or strangulation.   Colonic diverticulosis. No radiographic evidence of diverticulitis.   Stable enlarged prostate.   12/17/2019 Pathology Results   HIPEC Surgery by Dr Clovis Riley 12/17/19 FINAL PATHOLOGIC DIAGNOSIS  MICROSCOPIC EXAMINATION AND DIAGNOSIS  A.          "RIGHT COLON, OMENTUM, SPLEEN":  Invasive adenocarcinoma with mucinous features, moderately differentiated.  Tumor involves mesentery and spleen.  Five out of ten lymph nodes involved by adenocarcinoma with perinodal soft tissue involvement (5/10), see comment.  Margins are uninvolved  by invasive carcinoma.  Ileum and appendix, uninvolved.  See comment and cancer case summary.  B.          "ROUND LIGAMENT OF LIVER":       Involved by adenocarcinoma with mucinous features, moderately differentiated.  C.          "RIGHT DIAPHRAM STRIPPING":       Negative for carcinoma.  D.          "RIGHT HEPATIC CAPSULECTOMY":       Chronic inflammation and fibrosis.  Negative for carcinoma.  E.          "DEBRIDED TUMOR":       Involved by adenocarcinoma with mucinous features, moderately differentiated.   COMMENT: Sections disclose five out of ten lymph nodes involved by adenocarcinoma with perinodal soft tissue involvement. However, it is not possible to be certain if this represents lymph nodes replaced by tumor and/or soft tissue involvement. In addition, focal perineural invasion is seen.   04/03/2020 Imaging   CT AP W contrast  IMPRESSION: 1. Status post interval splenectomy and right hemicolectomy. 2. Response to therapy of omental/peritoneal metastasis. The only residual indeterminate finding is fluid density along the capsule of the hepatic dome which could be postoperative or secondary to localized residual peritoneal disease. 3. Left nephrolithiasis. 4.  Possible constipation. 5. Prostatomegaly. 6.  Aortic Atherosclerosis (ICD10-I70.0).   07/11/2020 Imaging   CT AP  1.  Postsurgical changes of subtle reductive surgery including right hemicolectomy, omentectomy, splenectomy, and right hepatic capsulectomy.  2.  Interval development of multiple areas of low attenuation involving the liver, as described above. This the intraparenchymal low-density lesion is concerning for metastatic disease. Other disease along the capsule may be post therapy related. Residual thickening of the anterior peritoneum particularly anterior to the right lobe of the liver is also concerning for some residual disease although appears significantly improved. Recommend attention on  follow-up.  3.  No definite evidence of residual peritoneal disease status post HIPEC.   09/18/2020 -  Chemotherapy   Second line chemo FOLFIRI and bevacizumab q2weeks starting 09/18/20. Chemo on hold since 12/23/20 due to fistula. RESTART with low dose irinotecan alone on 04/03/21.   12/19/2020 Imaging   CT CAP  IMPRESSION: 1. Interval development of a 1.6 x 2.7 x 1.6 cm collection of gas and debris between the sigmoid colon and dome of bladder, compatible with small abscess. A portion of this abscess is probably intramural at the dome of bladder. Marked associated thickening of the adjacent colonic and bladder walls with tiny gas bubbles in the thickened bladder wall and prominent amount of free gas in the bladder lumen suggests an associated colovesical fistula. Tiny gas bubbles are also seen in the central prostate gland and may be in the urethra although parenchymal gas within the prostate gland is not excluded on this exam. 2. Nodularity along the major and minor fissures of the right hemithorax has decreased in the interval, nearly imperceptible today. 3. Peritoneal implants along the liver, right abdomen, and deep to the left paramidline anterior abdominal wall have resolved in the interval by CT imaging. 4. Tiny right lower lobe pulmonary nodule is more conspicuous today. Attention on follow-up recommended. 5. Stable 4 mm hypodensity in the dome of the right liver, too small  to characterize. 6. Nonobstructing left renal stones with bilateral renal cysts. 7. Stable compression deformity at multiple thoracic and lumbar levels. 8. Aortic Atherosclerosis (ICD10-I70.0).   02/24/2021 Surgery   LAPAROTOMY EXPLORATORY resection of sigmoid colon and proximal rectum, takedown of colovesical fistula, creation of end ileosotmy by Dr Clovis Riley    Final Pathologic Diagnosis      A.  SIGMOID COLON AND RECTUM, RESECTION:               Invasive adenocarcinoma with mucinous features, moderately  differentiated.               Tumor measures 3.2 cm in greatest dimension. Tumor invades visceral peritoneum.  Lymphovascular invasion is present.               Metastatic adenocarcinoma involving seven (of 22) lymph nodes (7/22).  One margin involved by adenocarcinoma (grossly presumed distal margin).     03/25/2021 Imaging   IMPRESSION: 1. New bilateral pulmonary nodules worrisome for new pulmonary metastatic disease. 2. New small right pleural effusion with overlying atelectasis. 3. Recurrent pleural nodularity on the right. 4. No findings for abdominal/pelvic metastatic disease. 5. Stable severe atrophy of the pancreatic body and tail with associated main pancreatic duct dilatation. No obvious obstructing pancreatic lesion but this was not present on the prior CT scan from 04/03/2020. It may be due to a stricture or a small ductal lesion, not well seen. MRI abdomen without and with contrast may be helpful for further evaluation of this finding. 6. Stable surgical changes involving the colon with a Hartmann's pouch and left lower quadrant colostomy. 7. Stable enlarged prostate gland. 8. Stable thoracic and lumbar compression fractures.   07/07/2021 Imaging   CT AP  IMPRESSION: 1. Redemonstrated postoperative findings of right hemicolectomy and ileocolic anastomosis as well as Hartmann procedure sigmoid colon resection and left lower quadrant end colostomy. 2. No evidence of recurrent or metastatic disease in the abdomen or pelvis. 3. Multiple small pulmonary nodules are not significantly changed compared to prior examination. There is unchanged thickening and nodularity along the pleural surfaces of the right lung. Findings remain consistent with pulmonary parenchymal and pleural metastatic disease. 4. Small right pleural effusion and associated atelectasis or consolidation, slightly increased compared to prior examination. 5. Status post splenectomy and cholecystectomy. 6.  Prostatomegaly. 7. Coronary artery disease.   Aortic Atherosclerosis (ICD10-I70.0).   10/29/2021 -  Chemotherapy   Patient is on Treatment Plan : COLORECTAL FOLFOX q14d        INTERVAL HISTORY:  Nathaniel Norton is here for a follow up of metastatic colon cancer. He was last seen by me on 10/15/21. He presents to the clinic alone. He reports he is still unable to keep food down the Friday after treatment. He notes his stomach is more active today, "with the rumbling and grumbling." He notes when a bowel movement goes into the bag, it's soft but not watery diarrhea. I asked if he is eating vegetables, and he notes he ate a big salad one day which caused his stomach to be active. He also reports continued anal discharge, "which is a pain in the neck."   All other systems were reviewed with the patient and are negative.  MEDICAL HISTORY:  Past Medical History:  Diagnosis Date   colon ca dx'd 03/2019    SURGICAL HISTORY: Past Surgical History:  Procedure Laterality Date   CATARACT EXTRACTION     IR IMAGING GUIDED PORT INSERTION  05/10/2019   L4 fracture  2012   RETINAL DETACHMENT SURGERY     Right hand surgery  1974    I have reviewed the social history and family history with the patient and they are unchanged from previous note.  ALLERGIES:  is allergic to feraheme [ferumoxytol] and codeine.  MEDICATIONS:  Current Outpatient Medications  Medication Sig Dispense Refill   diphenoxylate-atropine (LOMOTIL) 2.5-0.025 MG tablet Take 1-2 tablets by mouth 4 (four) times daily as needed for diarrhea or loose stools. 90 tablet 1   fluticasone (FLONASE) 50 MCG/ACT nasal spray Place into the nose.     lidocaine-prilocaine (EMLA) cream Apply 1 application topically as needed. 30 g 1   Multiple Vitamin (MULTIVITAMIN) tablet Take 1 tablet by mouth daily.     MULTIPLE VITAMIN PO      ondansetron (ZOFRAN) 8 MG tablet Take 1 tablet (8 mg total) by mouth every 8 (eight) hours as needed for  nausea or vomiting. Start on day 3 after chemotherapy 20 tablet 0   ondansetron (ZOFRAN-ODT) 4 MG disintegrating tablet Take 4 mg by mouth every 8 (eight) hours as needed.     oxyCODONE (OXY IR/ROXICODONE) 5 MG immediate release tablet Take 1 tablet (5 mg total) by mouth every 8 (eight) hours as needed for severe pain. 60 tablet 0   prochlorperazine (COMPAZINE) 10 MG tablet Take 1 tablet (10 mg total) by mouth every 6 (six) hours as needed (Nausea or vomiting). 30 tablet 2   sodium chloride flush 0.9 % SOLN injection      No current facility-administered medications for this visit.   Facility-Administered Medications Ordered in Other Visits  Medication Dose Route Frequency Provider Last Rate Last Admin   dexamethasone (DECADRON) 10 mg in sodium chloride 0.9 % 50 mL IVPB  10 mg Intravenous Once Truitt Merle, MD       fluorouracil (ADRUCIL) 3,300 mg in sodium chloride 0.9 % 84 mL chemo infusion  2,000 mg/m2 (Treatment Plan Recorded) Intravenous 1 day or 1 dose Truitt Merle, MD       heparin lock flush 100 unit/mL  500 Units Intracatheter Once PRN Truitt Merle, MD       leucovorin 664 mg in dextrose 5 % 250 mL infusion  400 mg/m2 (Treatment Plan Recorded) Intravenous Once Truitt Merle, MD       oxaliplatin (ELOXATIN) 85 mg in dextrose 5 % 500 mL chemo infusion  50 mg/m2 (Treatment Plan Recorded) Intravenous Once Truitt Merle, MD       palonosetron (ALOXI) injection 0.25 mg  0.25 mg Intravenous Once Truitt Merle, MD       sodium chloride flush (NS) 0.9 % injection 10 mL  10 mL Intracatheter PRN Truitt Merle, MD        PHYSICAL EXAMINATION: ECOG PERFORMANCE STATUS: 2 - Symptomatic, <50% confined to bed  Vitals:   10/29/21 0836  BP: 116/70  Pulse: 80  Resp: 17  Temp: 97.6 F (36.4 C)  SpO2: 98%   Wt Readings from Last 3 Encounters:  10/29/21 129 lb 6 oz (58.7 kg)  10/15/21 130 lb 1.6 oz (59 kg)  10/01/21 130 lb (59 kg)     GENERAL:alert, no distress and comfortable SKIN: skin color normal, no rashes or  significant lesions EYES: normal, Conjunctiva are pink and non-injected, sclera clear  NEURO: alert & oriented x 3 with fluent speech  LABORATORY DATA:  I have reviewed the data as listed CBC Latest Ref Rng & Units 10/29/2021 10/15/2021 10/01/2021  WBC 4.0 - 10.5 K/uL 6.2 6.9 7.1  Hemoglobin 13.0 - 17.0 g/dL 10.7(L) 10.7(L) 11.0(L)  Hematocrit 39.0 - 52.0 % 32.5(L) 32.2(L) 33.6(L)  Platelets 150 - 400 K/uL 267 279 272     CMP Latest Ref Rng & Units 10/29/2021 10/15/2021 10/01/2021  Glucose 70 - 99 mg/dL 113(H) 101(H) 96  BUN 8 - 23 mg/dL $Remove'9 13 11  'NzOgtaE$ Creatinine 0.61 - 1.24 mg/dL 1.47(H) 1.33(H) 1.19  Sodium 135 - 145 mmol/L 140 141 142  Potassium 3.5 - 5.1 mmol/L 3.8 4.0 3.7  Chloride 98 - 111 mmol/L 102 103 105  CO2 22 - 32 mmol/L $RemoveB'26 29 27  'iyauPDuS$ Calcium 8.9 - 10.3 mg/dL 9.8 9.3 9.5  Total Protein 6.5 - 8.1 g/dL 6.8 7.0 7.0  Total Bilirubin 0.3 - 1.2 mg/dL 0.9 0.7 0.7  Alkaline Phos 38 - 126 U/L 182(H) 154(H) 126  AST 15 - 41 U/L 33 58(H) 49(H)  ALT 0 - 44 U/L 44 59(H) 41      RADIOGRAPHIC STUDIES: I have personally reviewed the radiological images as listed and agreed with the findings in the report. No results found.    No orders of the defined types were placed in this encounter.  All questions were answered. The patient knows to call the clinic with any problems, questions or concerns. No barriers to learning was detected. The total time spent in the appointment was 30 minutes.     Truitt Merle, MD 10/29/2021   I, Wilburn Mylar, am acting as scribe for Truitt Merle, MD.   I have reviewed the above documentation for accuracy and completeness, and I agree with the above.

## 2021-10-29 NOTE — Patient Instructions (Addendum)
[facility]  Discharge Instructions: Thank you for choosing [facility] to provide your oncology and hematology care.   If you have a lab appointment with the [facility], please go directly to the [facility] and check in at the registration area.   Wear comfortable clothing and clothing appropriate for easy access to any Portacath or PICC line.   We strive to give you quality time with your provider. You may need to reschedule your appointment if you arrive late (15 or more minutes).  Arriving late affects you and other patients whose appointments are after yours.  Also, if you miss three or more appointments without notifying the office, you may be dismissed from the clinic at the provider's discretion.      For prescription refill requests, have your pharmacy contact our office and allow 72 hours for refills to be completed.    Today you received the following chemotherapy and/or immunotherapy agents: Oxaliplatin, Leucovorin, and Fluorouracil.    To help prevent nausea and vomiting after your treatment, we encourage you to take your nausea medication as directed.  BELOW ARE SYMPTOMS THAT SHOULD BE REPORTED IMMEDIATELY: *FEVER GREATER THAN 100.4 F (38 C) OR HIGHER *CHILLS OR SWEATING *NAUSEA AND VOMITING THAT IS NOT CONTROLLED WITH YOUR NAUSEA MEDICATION *UNUSUAL SHORTNESS OF BREATH *UNUSUAL BRUISING OR BLEEDING *URINARY PROBLEMS (pain or burning when urinating, or frequent urination) *BOWEL PROBLEMS (unusual diarrhea, constipation, pain near the anus) TENDERNESS IN MOUTH AND THROAT WITH OR WITHOUT PRESENCE OF ULCERS (sore throat, sores in mouth, or a toothache) UNUSUAL RASH, SWELLING OR PAIN  UNUSUAL VAGINAL DISCHARGE OR ITCHING   Items with * indicate a potential emergency and should be followed up as soon as possible or go to the Emergency Department if any problems should occur.  Please show the CHEMOTHERAPY ALERT CARD or  IMMUNOTHERAPY ALERT CARD at check-in to the Emergency Department and triage nurse.  Should you have questions after your visit or need to cancel or reschedule your appointment, please contact [facility]  Dept: [phone]  and follow the prompts.  Office hours are 8:00 a.m. to 4:30 p.m. Monday - Friday. Please note that voicemails left after 4:00 p.m. may not be returned until the following business day.  We are closed weekends and major holidays. You have access to a nurse at all times for urgent questions. Please call the main number to the clinic Dept: [phone] and follow the prompts.   For any non-urgent questions, you may also contact your provider using MyChart. We now offer e-Visits for anyone 79 and older to request care online for non-urgent symptoms. For details visit [email or web address].   Also download the MyChart app! Go to the app store, search "MyChart", open the app, select Center, and log in with your MyChart username and password.  Due to Covid, a mask is required upon entering the hospital/clinic. If you do not have a mask, one will be given to you upon arrival. For doctor visits, patients may have 1 support person aged 47 or older with them. For treatment visits, patients cannot have anyone with them due to current Covid guidelines and our immunocompromised population.   The chemotherapy medication bag should finish at 46 hours, 96 hours, or 7 days. For example, if your pump is scheduled for 46 hours and it was put on at 4:00 p.m., it should finish at 2:00 p.m. the day it is scheduled to come off regardless of  your appointment time.     Estimated time to finish at: 11:30 am   If the display on your pump reads "Low Volume" and it is beeping, take the batteries out of the pump and come to the cancer center for it to be taken off.   If the pump alarms go off prior to the pump reading "Low Volume" then call [phone] and someone can  assist you.  If the plunger comes out and the chemotherapy medication is leaking out, please use your home chemo spill kit to clean up the spill. Do NOT use paper towels or other household products.  If you have problems or questions regarding your pump, please call either [phone] (24 hours a day) or the cancer center Monday-Friday 8:00 a.m.- 4:30 p.m. at the clinic number and we will assist you. If you are unable to get assistance, then go to the nearest Emergency Department and ask the staff to contact the IV team for assistance.    Rehydration, Adult Rehydration is the replacement of body fluids, salts, and minerals (electrolytes) that are lost during dehydration. Dehydration is when there is not enough water or other fluids in the body. This happens when you lose more fluids than you take in. Common causes of dehydration include: Not drinking enough fluids. This can occur when you are ill or doing activities that require a lot of energy, especially in hot weather. Conditions that cause loss of water or other fluids, such as diarrhea, vomiting, sweating, or urinating a lot. Other illnesses, such as fever or infection. Certain medicines, such as those that remove excess fluid from the body (diuretics). Symptoms of mild or moderate dehydration may include thirst, dry lips and mouth, and dizziness. Symptoms of severe dehydration may include increased heart rate, confusion, fainting, and not urinating. For severe dehydration, you may need to get fluids through an IV at the hospital. For mild or moderate dehydration, you can usually rehydrate at home by drinking certain fluids as told by your health care provider. What are the risks? Generally, rehydration is safe. However, taking in too much fluid (overhydration) can be a problem. This is rare. Overhydration can cause an electrolyte imbalance, kidney failure, or a decrease in salt (sodium) levels in the body. Supplies needed You will need an  oral rehydration solution (ORS) if your health care provider tells you to use one. This is a drink to treat dehydration. It can be found in pharmacies and retail stores. How to rehydrate Fluids Follow instructions from your health care provider for rehydration. The kind of fluid and the amount you should drink depend on your condition. In general, you should choose drinks that you prefer. If told by your health care provider, drink an ORS. Make an ORS by following instructions on the package. Start by drinking small amounts, about  cup (120 mL) every 5-10 minutes. Slowly increase how much you drink until you have taken the amount recommended by your health care provider. Drink enough clear fluids to keep your urine pale yellow. If you were told to drink an ORS, finish it first, then start slowly drinking other clear fluids. Drink fluids such as: Water. This includes sparkling water and flavored water. Drinking only water can lead to having too little sodium in your body (hyponatremia). Follow the advice of your health care provider. Water from ice chips you suck on. Fruit juice with water you add to it (diluted). Sports drinks. Hot or cold herbal teas. Broth-based soups. Milk or milk products.  Food Follow instructions from your health care provider about what to eat while you rehydrate. Your health care provider may recommend that you slowly begin eating regular foods in small amounts. Eat foods that contain a healthy balance of electrolytes, such as bananas, oranges, potatoes, tomatoes, and spinach. Avoid foods that are greasy or contain a lot of sugar. In some cases, you may get nutrition through a feeding tube that is passed through your nose and into your stomach (nasogastric tube, or NG tube). This may be done if you have uncontrolled vomiting or diarrhea. Beverages to avoid Certain beverages may make dehydration worse. While you rehydrate, avoid drinking alcohol. How to tell if you are  recovering from dehydration You may be recovering from dehydration if: You are urinating more often than before you started rehydrating. Your urine is pale yellow. Your energy level improves. You vomit less frequently. You have diarrhea less frequently. Your appetite improves or returns to normal. You feel less dizzy or less light-headed. Your skin tone and color start to look more normal. Follow these instructions at home: Take over-the-counter and prescription medicines only as told by your health care provider. Do not take sodium tablets. Doing this can lead to having too much sodium in your body (hypernatremia). Contact a health care provider if: You continue to have symptoms of mild or moderate dehydration, such as: Thirst. Dry lips. Slightly dry mouth. Dizziness. Dark urine or less urine than normal. Muscle cramps. You continue to vomit or have diarrhea. Get help right away if you: Have symptoms of dehydration that get worse. Have a fever. Have a severe headache. Have been vomiting and the following happens: Your vomiting gets worse or does not go away. Your vomit includes blood or green matter (bile). You cannot eat or drink without vomiting. Have problems with urination or bowel movements, such as: Diarrhea that gets worse or does not go away. Blood in your stool (feces). This may cause stool to look black and tarry. Not urinating, or urinating only a small amount of very dark urine, within 6-8 hours. Have trouble breathing. Have symptoms that get worse with treatment. These symptoms may represent a serious problem that is an emergency. Do not wait to see if the symptoms will go away. Get medical help right away. Call your local emergency services (911 in the [place] Do not drive yourself to the hospital. Summary Rehydration is the replacement of body fluids and minerals (electrolytes) that are lost during dehydration. Follow instructions from your health care provider  for rehydration. The kind of fluid and amount you should drink depend on your condition. Slowly increase how much you drink until you have taken the amount recommended by your health care provider. Contact your health care provider if you continue to show signs of mild or moderate dehydration. This information is not intended to replace advice given to you by your health care provider. Make sure you discuss any questions you have with your health care provider. Document Revised: 01/30/2020 Document Reviewed: 12/10/2019 Elsevier Patient Education  2022 [employer].

## 2021-10-31 ENCOUNTER — Other Ambulatory Visit: Payer: Self-pay

## 2021-10-31 ENCOUNTER — Inpatient Hospital Stay: Payer: Medicare Other

## 2021-10-31 VITALS — BP 110/75 | HR 89 | Temp 97.4°F | Resp 16

## 2021-10-31 DIAGNOSIS — Z923 Personal history of irradiation: Secondary | ICD-10-CM | POA: Diagnosis not present

## 2021-10-31 DIAGNOSIS — C786 Secondary malignant neoplasm of retroperitoneum and peritoneum: Secondary | ICD-10-CM | POA: Diagnosis not present

## 2021-10-31 DIAGNOSIS — R112 Nausea with vomiting, unspecified: Secondary | ICD-10-CM | POA: Diagnosis not present

## 2021-10-31 DIAGNOSIS — C182 Malignant neoplasm of ascending colon: Secondary | ICD-10-CM

## 2021-10-31 DIAGNOSIS — D630 Anemia in neoplastic disease: Secondary | ICD-10-CM | POA: Diagnosis not present

## 2021-10-31 DIAGNOSIS — K5900 Constipation, unspecified: Secondary | ICD-10-CM | POA: Diagnosis not present

## 2021-10-31 DIAGNOSIS — R634 Abnormal weight loss: Secondary | ICD-10-CM | POA: Diagnosis not present

## 2021-10-31 DIAGNOSIS — Z5111 Encounter for antineoplastic chemotherapy: Secondary | ICD-10-CM | POA: Diagnosis not present

## 2021-10-31 MED ORDER — SODIUM CHLORIDE 0.9% FLUSH
10.0000 mL | INTRAVENOUS | Status: DC | PRN
  Administered 2021-10-31: 10 mL

## 2021-10-31 MED ORDER — HEPARIN SOD (PORK) LOCK FLUSH 100 UNIT/ML IV SOLN
500.0000 [IU] | Freq: Once | INTRAVENOUS | Status: AC | PRN
  Administered 2021-10-31: 500 [IU]

## 2021-11-11 MED FILL — Dexamethasone Sodium Phosphate Inj 100 MG/10ML: INTRAMUSCULAR | Qty: 1 | Status: AC

## 2021-11-12 ENCOUNTER — Other Ambulatory Visit: Payer: Self-pay

## 2021-11-12 ENCOUNTER — Inpatient Hospital Stay (HOSPITAL_BASED_OUTPATIENT_CLINIC_OR_DEPARTMENT_OTHER): Payer: Medicare Other | Admitting: Hematology

## 2021-11-12 ENCOUNTER — Inpatient Hospital Stay: Payer: Medicare Other

## 2021-11-12 ENCOUNTER — Ambulatory Visit (HOSPITAL_COMMUNITY)
Admission: RE | Admit: 2021-11-12 | Discharge: 2021-11-12 | Disposition: A | Discharge status: Home / Self Care | Payer: Medicare Other | Source: Ambulatory Visit | Attending: Hematology | Admitting: Hematology

## 2021-11-12 ENCOUNTER — Inpatient Hospital Stay: Payer: Medicare Other | Attending: Hematology

## 2021-11-12 ENCOUNTER — Encounter: Payer: Self-pay | Admitting: Hematology

## 2021-11-12 VITALS — BP 114/81 | HR 92 | Temp 97.9°F | Resp 18 | Wt 124.6 lb

## 2021-11-12 DIAGNOSIS — C183 Malignant neoplasm of hepatic flexure: Secondary | ICD-10-CM | POA: Insufficient documentation

## 2021-11-12 DIAGNOSIS — C182 Malignant neoplasm of ascending colon: Secondary | ICD-10-CM | POA: Insufficient documentation

## 2021-11-12 DIAGNOSIS — C786 Secondary malignant neoplasm of retroperitoneum and peritoneum: Secondary | ICD-10-CM | POA: Insufficient documentation

## 2021-11-12 DIAGNOSIS — D500 Iron deficiency anemia secondary to blood loss (chronic): Secondary | ICD-10-CM

## 2021-11-12 DIAGNOSIS — Z95828 Presence of other vascular implants and grafts: Secondary | ICD-10-CM

## 2021-11-12 DIAGNOSIS — K56609 Unspecified intestinal obstruction, unspecified as to partial versus complete obstruction: Secondary | ICD-10-CM | POA: Diagnosis not present

## 2021-11-12 DIAGNOSIS — C7800 Secondary malignant neoplasm of unspecified lung: Secondary | ICD-10-CM | POA: Insufficient documentation

## 2021-11-12 DIAGNOSIS — K6389 Other specified diseases of intestine: Secondary | ICD-10-CM | POA: Diagnosis not present

## 2021-11-12 DIAGNOSIS — K5939 Other megacolon: Secondary | ICD-10-CM | POA: Diagnosis not present

## 2021-11-12 DIAGNOSIS — M47816 Spondylosis without myelopathy or radiculopathy, lumbar region: Secondary | ICD-10-CM | POA: Diagnosis not present

## 2021-11-12 LAB — CMP (CANCER CENTER ONLY)
ALT: 58 U/L — ABNORMAL HIGH (ref 0–44)
AST: 53 U/L — ABNORMAL HIGH (ref 15–41)
Albumin: 3.8 g/dL (ref 3.5–5.0)
Alkaline Phosphatase: 243 U/L — ABNORMAL HIGH (ref 38–126)
Anion gap: 11 (ref 5–15)
BUN: 15 mg/dL (ref 8–23)
CO2: 25 mmol/L (ref 22–32)
Calcium: 9.7 mg/dL (ref 8.9–10.3)
Chloride: 103 mmol/L (ref 98–111)
Creatinine: 1.44 mg/dL — ABNORMAL HIGH (ref 0.61–1.24)
GFR, Estimated: 52 mL/min — ABNORMAL LOW (ref >60)
Glucose, Bld: 114 mg/dL — ABNORMAL HIGH (ref 70–99)
Potassium: 3.9 mmol/L (ref 3.5–5.1)
Sodium: 139 mmol/L (ref 135–145)
Total Bilirubin: 1.2 mg/dL (ref 0.3–1.2)
Total Protein: 7.7 g/dL (ref 6.5–8.1)

## 2021-11-12 LAB — CBC WITH DIFFERENTIAL (CANCER CENTER ONLY)
Abs Immature Granulocytes: 0.03 10*3/uL (ref 0.00–0.07)
Basophils Absolute: 0 10*3/uL (ref 0.0–0.1)
Basophils Relative: 0 %
Eosinophils Absolute: 0 10*3/uL (ref 0.0–0.5)
Eosinophils Relative: 0 %
HCT: 34.9 % — ABNORMAL LOW (ref 39.0–52.0)
Hemoglobin: 11.6 g/dL — ABNORMAL LOW (ref 13.0–17.0)
Immature Granulocytes: 0 %
Lymphocytes Relative: 20 %
Lymphs Abs: 2.1 10*3/uL (ref 0.7–4.0)
MCH: 27.6 pg (ref 26.0–34.0)
MCHC: 33.2 g/dL (ref 30.0–36.0)
MCV: 82.9 fL (ref 80.0–100.0)
Monocytes Absolute: 1.2 10*3/uL — ABNORMAL HIGH (ref 0.1–1.0)
Monocytes Relative: 11 %
Neutro Abs: 7 10*3/uL (ref 1.7–7.7)
Neutrophils Relative %: 69 %
Platelet Count: 321 10*3/uL (ref 150–400)
RBC: 4.21 MIL/uL — ABNORMAL LOW (ref 4.22–5.81)
RDW: 18.9 % — ABNORMAL HIGH (ref 11.5–15.5)
WBC Count: 10.3 10*3/uL (ref 4.0–10.5)
nRBC: 0.2 % (ref 0.0–0.2)

## 2021-11-12 LAB — FERRITIN: Ferritin: 303 ng/mL (ref 24–336)

## 2021-11-12 MED ORDER — SODIUM CHLORIDE 0.9% FLUSH
10.0000 mL | INTRAVENOUS | Status: DC | PRN
  Administered 2021-11-12: 10 mL

## 2021-11-12 MED ORDER — HEPARIN SOD (PORK) LOCK FLUSH 100 UNIT/ML IV SOLN
500.0000 [IU] | Freq: Once | INTRAVENOUS | Status: AC | PRN
  Administered 2021-11-12: 500 [IU]

## 2021-11-12 MED ORDER — SODIUM CHLORIDE 0.9 % IV SOLN: INTRAVENOUS | Status: DC

## 2021-11-12 NOTE — Progress Notes (Signed)
Per Dr. Burr Medico, no chemotherapy treatment today just IV fluids.

## 2021-11-12 NOTE — Patient Instructions (Signed)

## 2021-11-12 NOTE — Progress Notes (Signed)
[place]   Telephone:([phone]) [phone] Fax:(336) [phone]   Clinic Follow up Note   Patient Care Team: Lajean Manes, MD as PCP - General (Internal Medicine) Berle Mull, MD as Consulting Physician (Family Medicine) Clarene Essex, MD as Consulting Physician (Gastroenterology) Truitt Merle, MD as Consulting Physician (Oncology)  Date of Service:  11/12/2021  CHIEF COMPLAINT: f/u of metastatic colon cancer  CURRENT THERAPY:  FOLFOX, starting 10/29/21  ASSESSMENT & PLAN:  Nathaniel Norton is a 70 y.o. male with   1. Symptom management: Nausea, vomiting, weight loss, constipation, mild neuropathy. -He has developed worsening constipation since recent chemo change.  Weight has dropped since switching to FOLFOX -continue f/u with dietician  -He is using oxycodone as needed for abdominal cramping. -he developed worsening symptoms after switching to FOLFOX. We will hold chemo today and proceed with IVF alone. I will also order x-ray to rule out bowel obstruction and prescribe lactulose.  2. Cancer of right colon, with peritoneal and lung metastasis, stage IV,  MMR normal, KRAS mutation (+), MSS -He was diagnosed in 03/2019. His Colonoscopy biopsy showed adenocarcinoma of right hepatic flexure colon. His peritoneal biopsy from 04/27/19 shows adenocarcinoma, consistent with metastasis of his colon cancer. This is now stage IV disease. -He was treated with first-line FOLFOX in June-November 2020 before proceeding with HIPEC surgery by Dr. Clovis Riley on 12/17/19.  -09/10/20 PET showed disease progression with hypermetabolic pleural (right) and peritoneal nodules. A biopsy was not felt to be necessary given his previously known peritoneal metastasis -He started second line FOLFIRI and bevacizumab q2weeks on 09/18/20, tolerated first 2 cycles poorly with abdominal pain/cramping and diarrhea.  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 Clovis Riley on 02/24/21.   -Resumed low-dose single agent irinotecan on 04/03/21, added low-dose 5-FU back on 05/14/21.  He overall tolerated the reduced dose well. He tried increased dose for 2 cycles but did not tolerate well. However, his CEA has been slowly increasing. -restaging CT CAP 10/09/21 showed a new 2.9 cm soft tissue mass within rectal stump at suture line, consistent with local recurrence, slight increase in pleural thickening, and slight enlargement of several pulmonary nodules.  -We changed his treatment to FOLFOX on 10/29/21, as he previously tolerated well. He started at a 25-30% dose reduction but did not tolerate well (see #1). -hold on chemo today    3. 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 Watt Climes showed internal hemorrhoids, benign polyps, diverticulosis and colon cancer. -He was treated with oral iron, IV Feraheme on 05/03/19 and 05/16/19 (had reaction with 2nd dose).  -With cancer recurrence his anemia recurred. Hgb went down when he restarted chemotherapy in 03/2021. Improved off FOLFIRI, hgb 11.6 today.   4. Goal of care discussion -The patient understands the goal of care is palliative. -I previously recommended DNR, he will think about it      PLAN:  -proceed with IVF alone, cancel FOLFOX today  -KUB today  -lab, flush and f/u in 2 weeks   Addendum -After clinic, I noticed abdominal X-RAY showed multiple dilated loops of small bowel with air-fluid level, concerning for small bowel obstruction. -will get a stat CT abdomen w contrast tomorrow, or  send him to ED  No problem-specific Assessment & Plan notes found for this encounter.   SUMMARY OF ONCOLOGIC HISTORY: Oncology History Overview Note   Cancer Staging Cancer of right colon [facility]) Staging form: Colon and Rectum, AJCC 8th Edition - Clinical stage from 04/09/2019: Stage IVC (cTX, cNX, pM1c) - Signed by Truitt Merle, MD on 05/03/2019     Cancer of right colon [facility])  04/09/2019 Procedure   Colonoscopy 04/09/19 by Dr Watt Climes IMPRESSION -internal hemorrhoids -Diverticulosis in the sigmoid colon  -2 small polyps in the rectum and in the proximal transverse colon, removed with a hot snare. Resected and retrieved.  -3 medium polyps in the proximal transverse colon, in the mid transverse colon and in the distal transverse colon, removed and resected and retrieved.  -likely malignant partially obstructing tumor at the hepatic flexure. biopsied, tattooed.  -1 large polyp in the mid ascending colon  -the examination was otherwise normal     04/09/2019 Initial Biopsy   FINAL MICROSCOPIC DIAGNOSIS: 04/09/19 1. LG intestine-hepatic flexure, Biopsy:   INVASIVE WELL DIFFERENTIATED ADENOCARCINOMA   04/09/2019 Cancer Staging   Staging form: Colon and Rectum, AJCC 8th Edition - Clinical stage from 04/09/2019: Stage IVC (cTX, cNX, pM1c) - Signed by Truitt Merle, MD on 05/03/2019    04/10/2019 Imaging   CT AP 04/10/19  IMPRESSION: 1. There is an eccentric mass of the colon involving the ascending colon near the hepatic flexure measuring approximately 3.4 x 3.4 by 2.0 cm (series 2, image 37, series 3, image 37). There is extensive soft tissue nodularity of the mesocolon and omentum, and likely areas of the peritoneum, for example bilateral upper quadrants (series 2, image 25). Findings are consistent with primary colon malignancy, probable omental and peritoneal involvement, and small volume malignant ascites. 2.  Other chronic and incidental findings as detailed above.   04/20/2019 Initial Diagnosis   Cancer of right colon ([place])   04/26/2019 Imaging   CT Chest 04/26/19 IMPRESSION: 1. Borderline to mild lower thoracic adenopathy, including  within the right internal mammary and juxta cardiophrenic stations. Given the appearance of the upper abdomen, suspicious for nodal metastasis. 2. A low right paratracheal node is borderline sized, but favored to be reactive. 3. No evidence of pulmonary metastasis. 4. Peritoneal metastasis and abdominal ascites, as before. 5. Pancreatic parenchymal calcifications indicative of chronic calcific pancreatitis. 6. Coronary artery atherosclerosis.   04/27/2019 Pathology Results   Diagnosis 04/27/19 Peritoneum, biopsy, right upper quadrant, perihepatic - ADENOCARCINOMA, CONSISTENT WITH COLONIC PRIMARY. - SEE COMMENT.   05/16/2019 - 10/31/2019 Chemotherapy   First line FOLFOX every 2 weeks with Avastin starting with cycle 2 starting 05/16/19. 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 Stopped after 10/31/19 for surgery.    07/23/2019 Imaging    CT AP W Contrast  IMPRESSION: 1. Primary ascending colon mass may be minimally smaller. Peritoneal metastatic disease appears slightly improved as well. 2. Chronic calcific pancreatitis. 3. Tiny left renal stone. 4. Small left inguinal hernia contains a knuckle of unobstructed colon. 5. Enlarged prostate.   10/29/2019 Imaging   CT AP W Contrast IMPRESSION: No significant change in small ascending colon soft tissue mass and diffuse omental carcinomatosis.   No new or progressive metastatic disease identified.   Stable small left inguinal hernia containing a loop of sigmoid colon. No evidence of bowel obstruction or strangulation.   Colonic diverticulosis. No  radiographic evidence of diverticulitis.   Stable enlarged prostate.   12/17/2019 Pathology Results   HIPEC Surgery by Dr Clovis Riley 12/17/19 FINAL PATHOLOGIC DIAGNOSIS  MICROSCOPIC EXAMINATION AND DIAGNOSIS  A.          "RIGHT  COLON, OMENTUM, SPLEEN":       Invasive adenocarcinoma with mucinous features, moderately differentiated.  Tumor involves mesentery and spleen.  Five out of ten lymph nodes involved by adenocarcinoma with perinodal soft tissue involvement (5/10), see comment.  Margins are uninvolved by invasive carcinoma.  Ileum and appendix, uninvolved.  See comment and cancer case summary.  B.          "ROUND LIGAMENT OF LIVER":       Involved by adenocarcinoma with mucinous features, moderately differentiated.  C.          "RIGHT DIAPHRAM STRIPPING":       Negative for carcinoma.  D.          "RIGHT HEPATIC CAPSULECTOMY":       Chronic inflammation and fibrosis.  Negative for carcinoma.  E.          "DEBRIDED TUMOR":       Involved by adenocarcinoma with mucinous features, moderately differentiated.   COMMENT: Sections disclose five out of ten lymph nodes involved by adenocarcinoma with perinodal soft tissue involvement. However, it is not possible to be certain if this represents lymph nodes replaced by tumor and/or soft tissue involvement. In addition, focal perineural invasion is seen.   04/03/2020 Imaging   CT AP W contrast  IMPRESSION: 1. Status post interval splenectomy and right hemicolectomy. 2. Response to therapy of omental/peritoneal metastasis. The only residual indeterminate finding is fluid density along the capsule of the hepatic dome which could be postoperative or secondary to localized residual peritoneal disease. 3. Left nephrolithiasis. 4.  Possible constipation. 5. Prostatomegaly. 6.  Aortic Atherosclerosis (ICD10-I70.0).   07/11/2020 Imaging   CT AP  1.  Postsurgical changes of subtle reductive surgery including right hemicolectomy, omentectomy, splenectomy, and right hepatic capsulectomy.  2.  Interval development of multiple areas of low attenuation involving the liver, as described above. This the intraparenchymal low-density lesion is concerning for  metastatic disease. Other disease along the capsule may be post therapy related. Residual thickening of the anterior peritoneum particularly anterior to the right lobe of the liver is also concerning for some residual disease although appears significantly improved. Recommend attention on follow-up.  3.  No definite evidence of residual peritoneal disease status post HIPEC.   09/18/2020 -  Chemotherapy   Second line chemo FOLFIRI and bevacizumab q2weeks starting 09/18/20. Chemo on hold since 12/23/20 due to fistula. RESTART with low dose irinotecan alone on 04/03/21.   12/19/2020 Imaging   CT CAP  IMPRESSION: 1. Interval development of a 1.6 x 2.7 x 1.6 cm collection of gas and debris between the sigmoid colon and dome of bladder, compatible with small abscess. A portion of this abscess is probably intramural at the dome of bladder. Marked associated thickening of the adjacent colonic and bladder walls with tiny gas bubbles in the thickened bladder wall and prominent amount of free gas in the bladder lumen suggests an associated colovesical fistula. Tiny gas bubbles are also seen in the central prostate gland and may be in the urethra although parenchymal gas within the prostate gland is not excluded on this exam. 2. Nodularity along the major and minor fissures of the right hemithorax has decreased in the interval, nearly imperceptible today. 3. Peritoneal implants  along the liver, right abdomen, and deep to the left paramidline anterior abdominal wall have resolved in the interval by CT imaging. 4. Tiny right lower lobe pulmonary nodule is more conspicuous today. Attention on follow-up recommended. 5. Stable 4 mm hypodensity in the dome of the right liver, too small to characterize. 6. Nonobstructing left renal stones with bilateral renal cysts. 7. Stable compression deformity at multiple thoracic and lumbar levels. 8. Aortic Atherosclerosis (ICD10-I70.0).   02/24/2021 Surgery    LAPAROTOMY EXPLORATORY resection of sigmoid colon and proximal rectum, takedown of colovesical fistula, creation of end ileosotmy by Dr Clovis Riley    Final Pathologic Diagnosis      A.  SIGMOID COLON AND RECTUM, RESECTION:               Invasive adenocarcinoma with mucinous features, moderately differentiated.               Tumor measures 3.2 cm in greatest dimension. Tumor invades visceral peritoneum.  Lymphovascular invasion is present.               Metastatic adenocarcinoma involving seven (of 22) lymph nodes (7/22).  One margin involved by adenocarcinoma (grossly presumed distal margin).     03/25/2021 Imaging   IMPRESSION: 1. New bilateral pulmonary nodules worrisome for new pulmonary metastatic disease. 2. New small right pleural effusion with overlying atelectasis. 3. Recurrent pleural nodularity on the right. 4. No findings for abdominal/pelvic metastatic disease. 5. Stable severe atrophy of the pancreatic body and tail with associated main pancreatic duct dilatation. No obvious obstructing pancreatic lesion but this was not present on the prior CT scan from 04/03/2020. It may be due to a stricture or a small ductal lesion, not well seen. MRI abdomen without and with contrast may be helpful for further evaluation of this finding. 6. Stable surgical changes involving the colon with a Hartmann's pouch and left lower quadrant colostomy. 7. Stable enlarged prostate gland. 8. Stable thoracic and lumbar compression fractures.   07/07/2021 Imaging   CT AP  IMPRESSION: 1. Redemonstrated postoperative findings of right hemicolectomy and ileocolic anastomosis as well as Hartmann procedure sigmoid colon resection and left lower quadrant end colostomy. 2. No evidence of recurrent or metastatic disease in the abdomen or pelvis. 3. Multiple small pulmonary nodules are not significantly changed compared to prior examination. There is unchanged thickening and nodularity along the pleural  surfaces of the right lung. Findings remain consistent with pulmonary parenchymal and pleural metastatic disease. 4. Small right pleural effusion and associated atelectasis or consolidation, slightly increased compared to prior examination. 5. Status post splenectomy and cholecystectomy. 6. Prostatomegaly. 7. Coronary artery disease.   Aortic Atherosclerosis (ICD10-I70.0).   10/29/2021 -  Chemotherapy   Patient is on Treatment Plan : COLORECTAL FOLFOX q14d        INTERVAL HISTORY:  Nathaniel Norton is here for a follow up of metastatic colon cancer. He was last seen by me on 10/29/21. He presents to the clinic alone. He reports he is feeling very nauseous today. He reports it started first thing this morning, and he vomited. He also reports he woke up last night with painful abdominal cramps. He notes he has been having constipation and will not have a bowel movement without taking dulcolax, but he adds that it only works sometimes. He also reports that his appetite is poor.   All other systems were reviewed with the patient and are negative.  MEDICAL HISTORY:  Past Medical History:  Diagnosis Date   colon  ca dx'd 03/2019    SURGICAL HISTORY: Past Surgical History:  Procedure Laterality Date   CATARACT EXTRACTION     IR IMAGING GUIDED PORT INSERTION  05/10/2019   L4 fracture  2012   RETINAL DETACHMENT SURGERY     Right hand surgery  1974    I have reviewed the social history and family history with the patient and they are unchanged from previous note.  ALLERGIES:  is allergic to feraheme [ferumoxytol] and codeine.  MEDICATIONS:  Current Outpatient Medications  Medication Sig Dispense Refill   diphenoxylate-atropine (LOMOTIL) 2.5-0.025 MG tablet Take 1-2 tablets by mouth 4 (four) times daily as needed for diarrhea or loose stools. 90 tablet 1   fluticasone (FLONASE) 50 MCG/ACT nasal spray Place into the nose.     lidocaine-prilocaine (EMLA) cream Apply 1 application  topically as needed. 30 g 1   Multiple Vitamin (MULTIVITAMIN) tablet Take 1 tablet by mouth daily.     MULTIPLE VITAMIN PO      ondansetron (ZOFRAN) 8 MG tablet Take 1 tablet (8 mg total) by mouth every 8 (eight) hours as needed for nausea or vomiting. Start on day 3 after chemotherapy 20 tablet 0   ondansetron (ZOFRAN-ODT) 4 MG disintegrating tablet Take 4 mg by mouth every 8 (eight) hours as needed.     oxyCODONE (OXY IR/ROXICODONE) 5 MG immediate release tablet Take 1 tablet (5 mg total) by mouth every 8 (eight) hours as needed for severe pain. 60 tablet 0   prochlorperazine (COMPAZINE) 10 MG tablet Take 1 tablet (10 mg total) by mouth every 6 (six) hours as needed (Nausea or vomiting). 30 tablet 2   sodium chloride flush 0.9 % SOLN injection      Current Facility-Administered Medications  Medication Dose Route Frequency Provider Last Rate Last Admin   0.9 %  sodium chloride infusion   Intravenous Continuous Truitt Merle, MD   Stopped at 11/12/21 1258    PHYSICAL EXAMINATION: ECOG PERFORMANCE STATUS: 3 - Symptomatic, >50% confined to bed  Vitals:   11/12/21 0937  BP: 114/81  Pulse: 92  Resp: 18  Temp: 97.9 F (36.6 C)  SpO2: 99%   Wt Readings from Last 3 Encounters:  11/12/21 124 lb 9 oz (56.5 kg)  10/29/21 129 lb 6 oz (58.7 kg)  10/15/21 130 lb 1.6 oz (59 kg)     GENERAL:alert, no distress and comfortable SKIN: skin color, texture, turgor are normal, no rashes or significant lesions EYES: normal, Conjunctiva are pink and non-injected, sclera clear  NECK: supple, thyroid normal size, non-tender, without nodularity LYMPH:  no palpable lymphadenopathy in the cervical, axillary  LUNGS: clear to auscultation and percussion with normal breathing effort HEART: regular rate & rhythm and no murmurs and no lower extremity edema ABDOMEN:abdomen soft, non-tender; (+) very active bowel sounds Musculoskeletal:no cyanosis of digits and no clubbing  NEURO: alert & oriented x 3 with fluent  speech, no focal motor/sensory deficits  LABORATORY DATA:  I have reviewed the data as listed CBC Latest Ref Rng & Units 11/12/2021 10/29/2021 10/15/2021  WBC 4.0 - 10.5 K/uL 10.3 6.2 6.9  Hemoglobin 13.0 - 17.0 g/dL 11.6(L) 10.7(L) 10.7(L)  Hematocrit 39.0 - 52.0 % 34.9(L) 32.5(L) 32.2(L)  Platelets 150 - 400 K/uL 321 267 279     CMP Latest Ref Rng & Units 11/12/2021 10/29/2021 10/15/2021  Glucose 70 - 99 mg/dL 114(H) 113(H) 101(H)  BUN 8 - 23 mg/dL _0 Creatinine 0.61 - 1.24 mg/dL 1.44(H) 1.47(H) 1.33(H)  Sodium 135 - 145 mmol/L 139 140 141  Potassium 3.5 - 5.1 mmol/L 3.9 3.8 4.0  Chloride 98 - 111 mmol/L 103 102 103  CO2 22 - 32 mmol/L _0 Calcium 8.9 - 10.3 mg/dL 9.7 9.8 9.3  Total Protein 6.5 - 8.1 g/dL 7.7 6.8 7.0  Total Bilirubin 0.3 - 1.2 mg/dL 1.2 0.9 0.7  Alkaline Phos 38 - 126 U/L 243(H) 182(H) 154(H)  AST 15 - 41 U/L 53(H) 33 58(H)  ALT 0 - 44 U/L 58(H) 44 59(H)      RADIOGRAPHIC STUDIES: I have personally reviewed the radiological images as listed and agreed with the findings in the report. DG Abd 2 Views  Result Date: 11/12/2021 CLINICAL DATA:  Bowel obstruction EXAM: ABDOMEN - 2 VIEW COMPARISON:  None. FINDINGS: Multiple dilated loops of small bowel are seen in the abdomen with air-fluid level. Small amount of colonic gas. No evidence of free air. Visualized lungs are clear. Degenerative changes of the lumbar spine. IMPRESSION: Multiple dilated loops of small bowel are seen in the abdomen with air-fluid level, findings are concerning for small bowel obstruction. Consider further evaluation with CT. Electronically Signed   By: Yetta Glassman M.D.   On: 11/12/2021 10:58      Orders Placed This Encounter  Procedures   DG Abd 2 Views    Standing Status:   Future    Number of Occurrences:   1    Standing Expiration Date:   11/12/2022    Order Specific Question:   Reason for Exam (SYMPTOM  OR DIAGNOSIS REQUIRED)    Answer:   rule out bowel obstruction     Order Specific Question:   Preferred imaging location?    Answer:   [facility]   All questions were answered. The patient knows to call the clinic with any problems, questions or concerns. No barriers to learning was detected. The total time spent in the appointment was 30 minutes.     Truitt Merle, MD 11/12/2021   I, Wilburn Mylar, am acting as scribe for Truitt Merle, MD.   I have reviewed the above documentation for accuracy and completeness, and I agree with the above.

## 2021-11-13 ENCOUNTER — Encounter (HOSPITAL_COMMUNITY): Payer: Self-pay

## 2021-11-13 ENCOUNTER — Telehealth: Payer: Self-pay

## 2021-11-13 ENCOUNTER — Ambulatory Visit (HOSPITAL_COMMUNITY)
Admission: RE | Admit: 2021-11-13 | Discharge: 2021-11-13 | Disposition: A | Discharge status: Home / Self Care | Payer: Medicare Other | Source: Ambulatory Visit | Attending: Hematology | Admitting: Hematology

## 2021-11-13 ENCOUNTER — Inpatient Hospital Stay (HOSPITAL_BASED_OUTPATIENT_CLINIC_OR_DEPARTMENT_OTHER): Payer: Medicare Other | Admitting: Hematology

## 2021-11-13 VITALS — BP 128/85 | HR 88 | Temp 97.7°F | Resp 16 | Ht 66.0 in | Wt 126.6 lb

## 2021-11-13 DIAGNOSIS — C182 Malignant neoplasm of ascending colon: Secondary | ICD-10-CM

## 2021-11-13 DIAGNOSIS — C7800 Secondary malignant neoplasm of unspecified lung: Secondary | ICD-10-CM | POA: Diagnosis not present

## 2021-11-13 DIAGNOSIS — R1110 Vomiting, unspecified: Secondary | ICD-10-CM | POA: Diagnosis not present

## 2021-11-13 DIAGNOSIS — C786 Secondary malignant neoplasm of retroperitoneum and peritoneum: Secondary | ICD-10-CM | POA: Diagnosis not present

## 2021-11-13 DIAGNOSIS — C183 Malignant neoplasm of hepatic flexure: Secondary | ICD-10-CM | POA: Diagnosis not present

## 2021-11-13 MED ORDER — SODIUM CHLORIDE (PF) 0.9 % IJ SOLN
INTRAMUSCULAR | Status: AC
  Filled 2021-11-13: qty 50

## 2021-11-13 MED ORDER — HEPARIN SOD (PORK) LOCK FLUSH 100 UNIT/ML IV SOLN
500.0000 [IU] | Freq: Once | INTRAVENOUS | Status: AC
Start: 2021-11-13 — End: 2021-11-13
  Administered 2021-11-13: 500 [IU] via INTRAVENOUS

## 2021-11-13 MED ORDER — IOHEXOL 350 MG/ML SOLN
80.0000 mL | Freq: Once | INTRAVENOUS | Status: AC | PRN
  Administered 2021-11-13: 80 mL via INTRAVENOUS

## 2021-11-13 MED ORDER — HEPARIN SOD (PORK) LOCK FLUSH 100 UNIT/ML IV SOLN
INTRAVENOUS | Status: AC
  Filled 2021-11-13: qty 5

## 2021-11-13 NOTE — Progress Notes (Signed)
[place]   [phone] [phone]) [phone]   Clinic Follow up Note   Patient Care Team: Lajean Manes, MD as PCP - General (Internal Medicine) Berle Mull, MD as Consulting Physician (Family Medicine) Clarene Essex, MD as Consulting Physician (Gastroenterology) Truitt Merle, MD as Consulting Physician (Oncology)  Date of Service:  11/13/2021  CHIEF COMPLAINT: f/u of bowel obstruction; metastatic colon cancer  CURRENT THERAPY:  FOLFOX, starting 10/29/21  ASSESSMENT & PLAN:  Nathaniel Norton is a 70 y.o. male with   1. Partial Bowel Obstruction, secondary to metastatic colon cancer -abdominal X-RAY 11/12/21 showed multiple dilated loops of small bowel with air-fluid level, concerning for small bowel obstruction -CT AP today (11/13/21) showed: progressive small bowel distention compatible with partial small bowel obstruction, possibly secondary to a mass in the pelvis which is suspicious for local recurrence; unchanged presumed local recurrence within rectal stump; no other definite metastatic disease. -I personally reviewed the images with pt and discussed the findings with him. I reviewed options. He could be admitted to the hospital or supportive care. I spoke with Dr. Dema Severin and he does not think surgery is an option giving his peritoneal metastasis and history of HIPEC, which I agree. I also do not feel radiation is a good option at this time given his distended bowels. I also do not feel he can tolerate more chemo now since he is not eating. He does not want to be admitted to hospital, and would prefer to go home, which I feel is reasonable given there is not much to be done at the hospital except supportive care.  -I recommend liquid diet, no fibers, and symptom management with nausea and using laxative for bowel movement. He is agreeable. -His life expectancy is likely a few to several weeks.  I recommend home hospice, I reviewed the logistics and hospice  service with him in detail.  He agrees.  We will make a urgent referral today.  2.  Goal of care discussion, DNR  -We again discussed the incurable nature of his cancer, and the overall poor prognosis, especially given his poor tolerance to chemo -The patient understands the goal of care is palliative. -I recommend DNR/DNI, he agrees with this today (11/13/21)  3. Symptom management: Nausea, vomiting, weight loss, constipation, mild neuropathy. -He has developed worsening constipation since recent chemo change.  Weight has dropped since switching to FOLFOX -continue f/u with dietician  -He is using oxycodone as needed for abdominal cramping. -he developed worsening symptoms after switching to FOLFOX. We held C2 and he received IVF only. -I will prescribe lactulose.   4. Cancer of right colon, with peritoneal and lung metastasis, stage IV,  MMR normal, KRAS mutation (+), MSS -He was diagnosed in 03/2019. His Colonoscopy biopsy showed adenocarcinoma of right hepatic flexure colon. His peritoneal biopsy from 04/27/19 shows adenocarcinoma, consistent with metastasis of his colon cancer. This is now stage IV disease. -He was treated with first-line FOLFOX in June-November 2020 before proceeding with HIPEC surgery by Dr. Clovis Riley on 12/17/19.  -09/10/20 PET showed disease progression with hypermetabolic pleural (right) and peritoneal nodules. A biopsy was not felt to be necessary given his previously known peritoneal metastasis -He started second line FOLFIRI and bevacizumab q2weeks on 09/18/20, tolerated first 2 cycles poorly with abdominal pain/cramping and diarrhea.  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 Clovis Riley on 02/24/21.   -Resumed  low-dose single agent irinotecan on 04/03/21, added low-dose 5-FU back on 05/14/21.  He overall tolerated the reduced dose well. He tried increased dose for 2 cycles but did not tolerate well. However, his CEA has been slowly increasing. -restaging CT CAP 10/09/21 showed a new 2.9 cm soft tissue mass within rectal stump at suture line, consistent with local recurrence, slight increase in pleural thickening, and slight enlargement of several pulmonary nodules.  -We changed his treatment to FOLFOX on 10/29/21, as he previously tolerated well. He started at a 25-30% dose reduction but did not tolerate well (see above). -due to his symptoms, we obtained x-ray yesterday and CT AP today that showed partial small bowel obstruction. See above discussion.   5. 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 Watt Climes showed internal hemorrhoids, benign polyps, diverticulosis and colon cancer. -He was treated with oral iron, IV Feraheme on 05/03/19 and 05/16/19 (had reaction with 2nd dose).  -With cancer recurrence his anemia recurred. Hgb went down when he restarted chemotherapy in 03/2021. Improved off FOLFIRI, hgb 11.6 today.     PLAN:  -I prescribed lactulose today, he will change diet to liquid diet  -urgent referral to hospice -f/u open, I will remain to be his attending when he is under hospice care   No problem-specific Assessment & Plan notes found for this encounter.   SUMMARY OF ONCOLOGIC HISTORY: Oncology History Overview Note  Cancer Staging Cancer of right colon [facility]) Staging form: Colon and Rectum, AJCC 8th Edition - Clinical stage from 04/09/2019: Stage IVC (cTX, cNX, pM1c) - Signed by Truitt Merle, MD on 05/03/2019     Cancer of right colon [facility])  04/09/2019 Procedure   Colonoscopy 04/09/19 by Dr Watt Climes IMPRESSION -internal hemorrhoids -Diverticulosis in the sigmoid colon  -2 small polyps in the rectum and in the proximal transverse  colon, removed with a hot snare. Resected and retrieved.  -3 medium polyps in the proximal transverse colon, in the mid transverse colon and in the distal transverse colon, removed and resected and retrieved.  -likely malignant partially obstructing tumor at the hepatic flexure. biopsied, tattooed.  -1 large polyp in the mid ascending colon  -the examination was otherwise normal     04/09/2019 Initial Biopsy   FINAL MICROSCOPIC DIAGNOSIS: 04/09/19 1. LG intestine-hepatic flexure, Biopsy:   INVASIVE WELL DIFFERENTIATED ADENOCARCINOMA   04/09/2019 Cancer Staging   Staging form: Colon and Rectum, AJCC 8th Edition - Clinical stage from 04/09/2019: Stage IVC (cTX, cNX, pM1c) - Signed by Truitt Merle, MD on 05/03/2019    04/10/2019 Imaging   CT AP 04/10/19  IMPRESSION: 1. There is an eccentric mass of the colon involving the ascending colon near the hepatic flexure measuring approximately 3.4 x 3.4 by 2.0 cm (series 2, image 37, series 3, image 37). There is extensive soft tissue nodularity of the mesocolon and omentum, and likely areas of the peritoneum, for example bilateral upper quadrants (series 2, image 25). Findings are consistent with primary colon malignancy, probable omental and peritoneal involvement, and small volume malignant ascites. 2.  Other chronic and incidental findings as detailed above.   04/20/2019 Initial Diagnosis   Cancer of right colon ([place])   04/26/2019 Imaging   CT Chest 04/26/19 IMPRESSION: 1. Borderline to mild lower thoracic adenopathy, including within the right  internal mammary and juxta cardiophrenic stations. Given the appearance of the upper abdomen, suspicious for nodal metastasis. 2. A low right paratracheal node is borderline sized, but favored to be reactive. 3. No evidence of pulmonary metastasis. 4. Peritoneal metastasis and abdominal ascites, as before. 5. Pancreatic parenchymal calcifications indicative of chronic calcific pancreatitis. 6.  Coronary artery atherosclerosis.   04/27/2019 Pathology Results   Diagnosis 04/27/19 Peritoneum, biopsy, right upper quadrant, perihepatic - ADENOCARCINOMA, CONSISTENT WITH COLONIC PRIMARY. - SEE COMMENT.   05/16/2019 - 10/31/2019 Chemotherapy   First line FOLFOX every 2 weeks with Avastin starting with cycle 2 starting 05/16/19. 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 Stopped after 10/31/19 for surgery.    07/23/2019 Imaging    CT AP W Contrast  IMPRESSION: 1. Primary ascending colon mass may be minimally smaller. Peritoneal metastatic disease appears slightly improved as well. 2. Chronic calcific pancreatitis. 3. Tiny left renal stone. 4. Small left inguinal hernia contains a knuckle of unobstructed colon. 5. Enlarged prostate.   10/29/2019 Imaging   CT AP W Contrast IMPRESSION: No significant change in small ascending colon soft tissue mass and diffuse omental carcinomatosis.   No new or progressive metastatic disease identified.   Stable small left inguinal hernia containing a loop of sigmoid colon. No evidence of bowel obstruction or strangulation.   Colonic diverticulosis. No radiographic evidence of diverticulitis.   Stable enlarged prostate.   12/17/2019 Pathology Results   HIPEC Surgery by Dr Clovis Riley 12/17/19 FINAL PATHOLOGIC DIAGNOSIS  MICROSCOPIC EXAMINATION AND DIAGNOSIS  A.          "RIGHT COLON, OMENTUM, SPLEEN":       Invasive adenocarcinoma with mucinous features, moderately differentiated.  Tumor involves mesentery and spleen.  Five out of ten lymph nodes involved by adenocarcinoma with perinodal soft tissue involvement (5/10), see comment.  Margins are uninvolved by invasive carcinoma.  Ileum and appendix, uninvolved.  See comment and cancer case summary.  B.          "ROUND LIGAMENT OF  LIVER":       Involved by adenocarcinoma with mucinous features, moderately differentiated.  C.          "RIGHT DIAPHRAM STRIPPING":       Negative for carcinoma.  D.          "RIGHT HEPATIC CAPSULECTOMY":       Chronic inflammation and fibrosis.  Negative for carcinoma.  E.          "DEBRIDED TUMOR":       Involved by adenocarcinoma with mucinous features, moderately differentiated.   COMMENT: Sections disclose five out of ten lymph nodes involved by adenocarcinoma with perinodal soft tissue involvement. However, it is not possible to be certain if this represents lymph nodes replaced by tumor and/or soft tissue involvement. In addition, focal perineural invasion is seen.   04/03/2020 Imaging   CT AP W contrast  IMPRESSION: 1. Status post interval splenectomy and right hemicolectomy. 2. Response to therapy of omental/peritoneal metastasis. The only residual indeterminate finding is fluid density along the capsule of the hepatic dome which could be postoperative or secondary to localized residual peritoneal disease. 3. Left nephrolithiasis. 4.  Possible constipation. 5. Prostatomegaly. 6.  Aortic Atherosclerosis (ICD10-I70.0).   07/11/2020 Imaging   CT AP  1.  Postsurgical changes of subtle reductive surgery including right hemicolectomy, omentectomy, splenectomy, and right hepatic capsulectomy.  2.  Interval development of multiple areas of low attenuation involving the liver, as described above. This the intraparenchymal low-density lesion is concerning for metastatic disease. Other disease along the capsule may be post therapy related. Residual thickening of the anterior peritoneum particularly anterior to the right lobe of the liver is also concerning for some residual disease although appears significantly improved. Recommend attention on follow-up.  3.  No definite evidence of residual peritoneal disease status post HIPEC.   09/18/2020 -  Chemotherapy   Second line chemo  FOLFIRI and bevacizumab q2weeks starting 09/18/20. Chemo on hold since 12/23/20 due to fistula. RESTART with low dose irinotecan alone on 04/03/21.   12/19/2020 Imaging   CT CAP  IMPRESSION: 1. Interval development of a 1.6 x 2.7 x 1.6 cm collection of gas and debris between the sigmoid colon and dome of bladder, compatible with small abscess. A portion of this abscess is probably intramural at the dome of bladder. Marked associated thickening of the adjacent colonic and bladder walls with tiny gas bubbles in the thickened bladder wall and prominent amount of free gas in the bladder lumen suggests an associated colovesical fistula. Tiny gas bubbles are also seen in the central prostate gland and may be in the urethra although parenchymal gas within the prostate gland is not excluded on this exam. 2. Nodularity along the major and minor fissures of the right hemithorax has decreased in the interval, nearly imperceptible today. 3. Peritoneal implants along the liver, right abdomen, and deep to the left paramidline anterior abdominal wall have resolved in the interval by CT imaging. 4. Tiny right lower lobe pulmonary nodule is more conspicuous today. Attention on follow-up recommended. 5. Stable 4 mm hypodensity in the dome of the right liver, too small to characterize. 6. Nonobstructing left renal stones with bilateral renal cysts. 7. Stable compression deformity at multiple thoracic and lumbar levels. 8. Aortic Atherosclerosis (ICD10-I70.0).   02/24/2021 Surgery   LAPAROTOMY EXPLORATORY resection of sigmoid colon and proximal rectum, takedown of colovesical fistula, creation of end ileosotmy by Dr Clovis Riley    Final Pathologic Diagnosis      A.  SIGMOID COLON AND RECTUM, RESECTION:               Invasive adenocarcinoma with mucinous features, moderately differentiated.               Tumor measures 3.2 cm in greatest dimension. Tumor invades visceral peritoneum.  Lymphovascular invasion  is present.               Metastatic adenocarcinoma involving seven (of 22) lymph nodes (7/22).  One margin involved by adenocarcinoma (grossly presumed distal margin).     03/25/2021 Imaging   IMPRESSION: 1. New bilateral pulmonary nodules worrisome for new pulmonary metastatic disease. 2. New small right pleural effusion with overlying atelectasis. 3. Recurrent pleural nodularity on the right. 4. No findings for abdominal/pelvic metastatic disease. 5. Stable severe atrophy of the pancreatic body and tail with associated main pancreatic duct dilatation. No obvious obstructing pancreatic lesion but this was not present on the prior CT scan from 04/03/2020. It may be due to a stricture or a small ductal lesion, not well seen. MRI abdomen without and with contrast may be helpful for further evaluation of this finding. 6. Stable surgical changes involving the colon with a Hartmann's pouch and left lower quadrant colostomy. 7. Stable enlarged prostate gland. 8. Stable thoracic and lumbar compression fractures.   07/07/2021 Imaging   CT AP  IMPRESSION:  1. Redemonstrated postoperative findings of right hemicolectomy and ileocolic anastomosis as well as Hartmann procedure sigmoid colon resection and left lower quadrant end colostomy. 2. No evidence of recurrent or metastatic disease in the abdomen or pelvis. 3. Multiple small pulmonary nodules are not significantly changed compared to prior examination. There is unchanged thickening and nodularity along the pleural surfaces of the right lung. Findings remain consistent with pulmonary parenchymal and pleural metastatic disease. 4. Small right pleural effusion and associated atelectasis or consolidation, slightly increased compared to prior examination. 5. Status post splenectomy and cholecystectomy. 6. Prostatomegaly. 7. Coronary artery disease.   Aortic Atherosclerosis (ICD10-I70.0).   10/29/2021 -  Chemotherapy   Patient is on  Treatment Plan : COLORECTAL FOLFOX q14d        INTERVAL HISTORY:  Nathaniel Norton is here for a follow up of bowel obstruction. He was last seen by me yesterday. He presents to the clinic alone. He denies nausea today but notes he hasn't eaten today and did not eat yesterday.   All other systems were reviewed with the patient and are negative.  MEDICAL HISTORY:  Past Medical History:  Diagnosis Date   colon ca dx'd 03/2019    SURGICAL HISTORY: Past Surgical History:  Procedure Laterality Date   CATARACT EXTRACTION     IR IMAGING GUIDED PORT INSERTION  05/10/2019   L4 fracture  2012   RETINAL DETACHMENT SURGERY     Right hand surgery  1974    I have reviewed the social history and family history with the patient and they are unchanged from previous note.  ALLERGIES:  is allergic to feraheme [ferumoxytol] and codeine.  MEDICATIONS:  Current Outpatient Medications  Medication Sig Dispense Refill   diphenoxylate-atropine (LOMOTIL) 2.5-0.025 MG tablet Take 1-2 tablets by mouth 4 (four) times daily as needed for diarrhea or loose stools. 90 tablet 1   fluticasone (FLONASE) 50 MCG/ACT nasal spray Place into the nose.     lidocaine-prilocaine (EMLA) cream Apply 1 application topically as needed. 30 g 1   Multiple Vitamin (MULTIVITAMIN) tablet Take 1 tablet by mouth daily.     MULTIPLE VITAMIN PO      ondansetron (ZOFRAN) 8 MG tablet Take 1 tablet (8 mg total) by mouth every 8 (eight) hours as needed for nausea or vomiting. Start on day 3 after chemotherapy 20 tablet 0   ondansetron (ZOFRAN-ODT) 4 MG disintegrating tablet Take 4 mg by mouth every 8 (eight) hours as needed.     oxyCODONE (OXY IR/ROXICODONE) 5 MG immediate release tablet Take 1 tablet (5 mg total) by mouth every 8 (eight) hours as needed for severe pain. 60 tablet 0   prochlorperazine (COMPAZINE) 10 MG tablet Take 1 tablet (10 mg total) by mouth every 6 (six) hours as needed (Nausea or vomiting). 30 tablet 2   sodium  chloride flush 0.9 % SOLN injection      No current facility-administered medications for this visit.   Facility-Administered Medications Ordered in Other Visits  Medication Dose Route Frequency Provider Last Rate Last Admin   heparin lock flush 100 UNIT/ML injection            sodium chloride (PF) 0.9 % injection             PHYSICAL EXAMINATION: ECOG PERFORMANCE STATUS: 2 - Symptomatic, <50% confined to bed  Vitals:   11/13/21 1504  BP: 128/85  Pulse: 88  Resp: 16  Temp: 97.7 F (36.5 C)  SpO2: 100%   Wt Readings from Last  3 Encounters:  11/13/21 126 lb 9.6 oz (57.4 kg)  11/12/21 124 lb 9 oz (56.5 kg)  10/29/21 129 lb 6 oz (58.7 kg)     GENERAL:alert, no distress and comfortable SKIN: skin color normal, no rashes or significant lesions EYES: normal, Conjunctiva are pink and non-injected, sclera clear  NEURO: alert & oriented x 3 with fluent speech  LABORATORY DATA:  I have reviewed the data as listed CBC Latest Ref Rng & Units 11/12/2021 10/29/2021 10/15/2021  WBC 4.0 - 10.5 K/uL 10.3 6.2 6.9  Hemoglobin 13.0 - 17.0 g/dL 11.6(L) 10.7(L) 10.7(L)  Hematocrit 39.0 - 52.0 % 34.9(L) 32.5(L) 32.2(L)  Platelets 150 - 400 K/uL 321 267 279     CMP Latest Ref Rng & Units 11/12/2021 10/29/2021 10/15/2021  Glucose 70 - 99 mg/dL 114(H) 113(H) 101(H)  BUN 8 - 23 mg/dL '15 9 13  ' Creatinine 0.61 - 1.24 mg/dL 1.44(H) 1.47(H) 1.33(H)  Sodium 135 - 145 mmol/L 139 140 141  Potassium 3.5 - 5.1 mmol/L 3.9 3.8 4.0  Chloride 98 - 111 mmol/L 103 102 103  CO2 22 - 32 mmol/L '25 26 29  ' Calcium 8.9 - 10.3 mg/dL 9.7 9.8 9.3  Total Protein 6.5 - 8.1 g/dL 7.7 6.8 7.0  Total Bilirubin 0.3 - 1.2 mg/dL 1.2 0.9 0.7  Alkaline Phos 38 - 126 U/L 243(H) 182(H) 154(H)  AST 15 - 41 U/L 53(H) 33 58(H)  ALT 0 - 44 U/L 58(H) 44 59(H)      RADIOGRAPHIC STUDIES: I have personally reviewed the radiological images as listed and agreed with the findings in the report. CT ABDOMEN PELVIS W CONTRAST  Result  Date: 11/13/2021 CLINICAL DATA:  New diagnosis of partial small bowel obstruction. History of colon cancer undergoing chemotherapy, last administered yesterday. Nausea, vomiting and constipation. EXAM: CT ABDOMEN AND PELVIS WITH CONTRAST TECHNIQUE: Multidetector CT imaging of the abdomen and pelvis was performed using the standard protocol following bolus administration of intravenous contrast. CONTRAST:  71m OMNIPAQUE IOHEXOL 350 MG/ML SOLN COMPARISON:  CTs 10/09/2021. FINDINGS: Lower chest: Multiple small nodules at both lung bases and nodular thickening of the right major fissure are grossly unchanged. Trace right pleural effusion. Hepatobiliary: The liver is normal in density without suspicious focal abnormality. Stable mild biliary dilatation status post cholecystectomy, slightly greater within the left hepatic lobe. Pancreas: Parenchymal atrophy and small calcifications without ductal dilatation or surrounding inflammation. Spleen: Status post splenectomy with stable focal subphrenic fat. Adrenals/Urinary Tract: Both adrenal glands appear normal. Small bilateral renal cysts and a nonobstructing calculus in the lower pole of the left kidney are unchanged. No evidence of ureteral calculus, hydronephrosis or delayed contrast excretion. The bladder appears unremarkable for its degree of distention. Stomach/Bowel: Enteric contrast was administered and has passed into the mid small bowel. Status post right hemicolectomy with ileocolonic anastomosis, diverting descending colostomy and Hartman pouch. There is progressive dilatation of the proximal to mid small bowel with decompression of the distal small bowel proximal to the ileocolonic anastomosis. Transition point appears to be in the right pelvis where there is a possible ill-defined soft tissue mass superior to the prostate gland causing partial small bowel obstruction. This is not well defined, although measures approximately 2.8 cm on image 64/2. As seen on  the recent study, there is a possible mass within the rectal stump, measuring up to 3.4 cm on image 67/2. The colostomy appears unchanged. Vascular/Lymphatic: No discretely enlarged abdominopelvic lymph nodes are identified. No acute vascular findings. The portal, superior mesenteric and  splenic veins are patent. There are small varices within the gastrohepatic ligament. Mild aortic and branch vessel atherosclerosis. Reproductive: Moderately enlarged and heterogeneous prostate gland. Other: Paucity of intra-fat. No ascites or peritoneal nodularity identified. No free air or focal extraluminal fluid collection. Musculoskeletal: No acute or significant osseous findings. Numerous thoracolumbar compression deformities have not significantly changed. IMPRESSION: 1. Progressive proximal to mid small bowel distension with transition point in the right pelvis compatible with partial small bowel obstruction. This obstruction could be secondary to a mass in the pelvis superior to the prostate gland, suspicious for local recurrence of colon cancer. 2. Presumed local recurrence within the rectal stump is unchanged from the most recent study. 3. No other definite evidence of metastatic disease in the abdomen or pelvis. Pleural and pulmonary nodularity at the lung bases is grossly unchanged. Electronically Signed   By: Richardean Sale M.D.   On: 11/13/2021 14:54   DG Abd 2 Views  Result Date: 11/12/2021 CLINICAL DATA:  Bowel obstruction EXAM: ABDOMEN - 2 VIEW COMPARISON:  None. FINDINGS: Multiple dilated loops of small bowel are seen in the abdomen with air-fluid level. Small amount of colonic gas. No evidence of free air. Visualized lungs are clear. Degenerative changes of the lumbar spine. IMPRESSION: Multiple dilated loops of small bowel are seen in the abdomen with air-fluid level, findings are concerning for small bowel obstruction. Consider further evaluation with CT. Electronically Signed   By: Yetta Glassman M.D.    On: 11/12/2021 10:58      No orders of the defined types were placed in this encounter.  All questions were answered. The patient knows to call the clinic with any problems, questions or concerns. No barriers to learning was detected. The total time spent in the appointment was 30 minutes.     Truitt Merle, MD 11/13/2021   I, Wilburn Mylar, am acting as scribe for Truitt Merle, MD.   I have reviewed the above documentation for accuracy and completeness, and I agree with the above.

## 2021-11-13 NOTE — Progress Notes (Signed)
Put in orders for Hospice referral to [facility].  Spoke with Jerald Kief, RN w/[facility] [phone]) regarding Hospice referral.  Lesleigh Noe took down some demographics for the pt and stated they have access to Epic to obtain the referral and insurance information.  Lesleigh Noe stated they will contact the patient over the weekend or on Monday 11/16/2021.

## 2021-11-13 NOTE — Telephone Encounter (Signed)
This nurse spoke with patient made aware of stat CT scan scheduled for 11/13/2021 At 130  must arrive by 115.  Patient acknowledges understanding and knows to come back to cancer center to see MD after scan.  No further questions or concerns at this time.

## 2021-11-13 NOTE — Addendum Note (Signed)
Addended by: Truitt Merle on: 11/13/2021 09:43 AM   Modules accepted: Orders

## 2021-11-14 ENCOUNTER — Inpatient Hospital Stay: Payer: Medicare Other

## 2021-11-15 ENCOUNTER — Encounter: Payer: Self-pay | Admitting: Hematology

## 2021-11-18 DIAGNOSIS — Z85038 Personal history of other malignant neoplasm of large intestine: Secondary | ICD-10-CM | POA: Diagnosis not present

## 2021-11-18 DIAGNOSIS — Z933 Colostomy status: Secondary | ICD-10-CM | POA: Diagnosis not present

## 2021-11-26 ENCOUNTER — Ambulatory Visit: Payer: Medicare Other | Admitting: Hematology

## 2021-11-26 ENCOUNTER — Ambulatory Visit: Payer: Medicare Other

## 2021-11-26 ENCOUNTER — Other Ambulatory Visit: Payer: Medicare Other

## 2021-12-15 DIAGNOSIS — Z0000 Encounter for general adult medical examination without abnormal findings: Secondary | ICD-10-CM | POA: Diagnosis not present

## 2021-12-15 DIAGNOSIS — C801 Malignant (primary) neoplasm, unspecified: Secondary | ICD-10-CM | POA: Diagnosis not present

## 2021-12-15 DIAGNOSIS — Z933 Colostomy status: Secondary | ICD-10-CM | POA: Diagnosis not present

## 2021-12-15 DIAGNOSIS — Z1389 Encounter for screening for other disorder: Secondary | ICD-10-CM | POA: Diagnosis not present

## 2021-12-15 DIAGNOSIS — G620 Drug-induced polyneuropathy: Secondary | ICD-10-CM | POA: Diagnosis not present

## 2022-01-13 DEATH — deceased

## 2023-11-02 NOTE — Telephone Encounter (Signed)
Telephone call
